# Patient Record
Sex: Male | Born: 1975
Health system: Southern US, Community
[De-identification: ages and names within clinical notes are randomized; demographics above are authoritative.]

## PROBLEM LIST (undated history)

## (undated) DIAGNOSIS — R519 Headache, unspecified: Secondary | ICD-10-CM

## (undated) DIAGNOSIS — F4310 Post-traumatic stress disorder, unspecified: Secondary | ICD-10-CM

## (undated) DIAGNOSIS — R040 Epistaxis: Secondary | ICD-10-CM

## (undated) DIAGNOSIS — M8700 Idiopathic aseptic necrosis of unspecified bone: Secondary | ICD-10-CM

## (undated) DIAGNOSIS — F32A Depression, unspecified: Secondary | ICD-10-CM

## (undated) DIAGNOSIS — I10 Essential (primary) hypertension: Secondary | ICD-10-CM

## (undated) DIAGNOSIS — D649 Anemia, unspecified: Secondary | ICD-10-CM

## (undated) DIAGNOSIS — R569 Unspecified convulsions: Secondary | ICD-10-CM

## (undated) DIAGNOSIS — C719 Malignant neoplasm of brain, unspecified: Secondary | ICD-10-CM

## (undated) HISTORY — DX: Unspecified convulsions: R56.9

## (undated) HISTORY — DX: Post-traumatic stress disorder, unspecified: F43.10

## (undated) HISTORY — DX: Malignant neoplasm of brain, unspecified: C71.9

## (undated) HISTORY — DX: Essential (primary) hypertension: I10

## (undated) HISTORY — PX: WISDOM TOOTH EXTRACTION: SHX21

---
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
## 1999-08-28 ENCOUNTER — Emergency Department (HOSPITAL_COMMUNITY): Admission: EM | Admit: 1999-08-28 | Discharge: 1999-08-28 | Payer: Self-pay | Admitting: Emergency Medicine

## 2011-02-25 DIAGNOSIS — C719 Malignant neoplasm of brain, unspecified: Secondary | ICD-10-CM

## 2011-02-25 DIAGNOSIS — C712 Malignant neoplasm of temporal lobe: Secondary | ICD-10-CM | POA: Insufficient documentation

## 2011-02-25 HISTORY — DX: Malignant neoplasm of brain, unspecified: C71.9

## 2011-02-25 HISTORY — PX: CRANIOTOMY FOR TUMOR: SUR345

## 2013-06-18 ENCOUNTER — Telehealth: Payer: Self-pay | Admitting: Oncology

## 2013-06-18 NOTE — Telephone Encounter (Signed)
S/W PT AND GVE NP APPT 10/24 @ 3 W/DR. [facility] REFERRING NEW [facility] WELCOME PACKET MAILED.

## 2013-06-18 NOTE — Telephone Encounter (Signed)
C/D 06/18/13 for appt 06/21/13

## 2013-06-20 ENCOUNTER — Other Ambulatory Visit: Payer: Self-pay | Admitting: Emergency Medicine

## 2013-06-20 DIAGNOSIS — C719 Malignant neoplasm of brain, unspecified: Secondary | ICD-10-CM

## 2013-06-21 ENCOUNTER — Ambulatory Visit (HOSPITAL_BASED_OUTPATIENT_CLINIC_OR_DEPARTMENT_OTHER): Payer: Managed Care, Other (non HMO) | Admitting: Oncology

## 2013-06-21 ENCOUNTER — Other Ambulatory Visit (HOSPITAL_BASED_OUTPATIENT_CLINIC_OR_DEPARTMENT_OTHER): Payer: Managed Care, Other (non HMO) | Admitting: Lab

## 2013-06-21 ENCOUNTER — Encounter: Payer: Self-pay | Admitting: Oncology

## 2013-06-21 ENCOUNTER — Ambulatory Visit: Payer: Self-pay

## 2013-06-21 DIAGNOSIS — I10 Essential (primary) hypertension: Secondary | ICD-10-CM | POA: Insufficient documentation

## 2013-06-21 DIAGNOSIS — C712 Malignant neoplasm of temporal lobe: Secondary | ICD-10-CM

## 2013-06-21 DIAGNOSIS — C719 Malignant neoplasm of brain, unspecified: Secondary | ICD-10-CM

## 2013-06-21 LAB — CBC WITH DIFFERENTIAL/PLATELET
BASO%: 0.4 % (ref 0.0–2.0)
EOS%: 2 % (ref 0.0–7.0)
HCT: 38.7 % (ref 38.4–49.9)
LYMPH%: 24.4 % (ref 14.0–49.0)
MCH: 34.1 pg — ABNORMAL HIGH (ref 27.2–33.4)
MCHC: 34.4 g/dL (ref 32.0–36.0)
MCV: 99 fL — ABNORMAL HIGH (ref 79.3–98.0)
MONO%: 10.3 % (ref 0.0–14.0)
NEUT#: 2.8 10*3/uL (ref 1.5–6.5)
NEUT%: 62.9 % (ref 39.0–75.0)
Platelets: 154 10*3/uL (ref 140–400)

## 2013-06-21 LAB — COMPREHENSIVE METABOLIC PANEL (CC13)
ALT: 13 U/L (ref 0–55)
AST: 15 U/L (ref 5–34)
Creatinine: 0.9 mg/dL (ref 0.7–1.3)
Glucose: 83 mg/dl (ref 70–140)
Total Bilirubin: 0.33 mg/dL (ref 0.20–1.20)

## 2013-06-21 MED ORDER — AMLODIPINE BESY-BENAZEPRIL HCL 5-20 MG PO CAPS: 1.0000 | ORAL_CAPSULE | Freq: Two times a day (BID) | ORAL | Status: DC

## 2013-06-21 NOTE — Patient Instructions (Signed)
Proceed with avastin on 10/27  I will see you back in 3 weeks on a Wednesday/Thursday

## 2013-06-23 NOTE — Progress Notes (Signed)
[facility]  Telephone:(336) [phone] Fax:(336) [phone]   MEDICAL ONCOLOGY - INITIAL CONSULATION    Referral MD Dr. Hulan Fray at [facility] neuro oncology  Reason for Referral: 37 year old gentleman with anaplastic oligodendroglioma diagnosed in 2012  Chief Complaint  Patient presents with  . 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 Patient's oncologic history dates as follows: 01/18/11: The patient complained of chest pain and burning sensation in the throat with associated with a metallic taste, hand tingling, and confusion described as "walking around in circles". 01/20/11: Patient presented to the emergency room where a CT/MRI scan revealed a right temporal lobe lesion, measuring 4.2 cm with mild enhancement and a mild 8 mm shift and uncal herniation. 01/28/11: The patient presented to the neurosurgery clinic with continual seizures TID. He also had associated right sided headaches. His Dilantin was changed to Keppra therapy. 02/25/11: Right temporal craniotomy with Dr. Lavonna Monarch with 70% removal of the tumor. Pathology revealed anaplastic oligodendroglioma (1p19q results pending). Postoperative MRI revealed interval large right temporal lobe craniotomy with residual (20%) tumor and a subdural fluid collection with a 7 mm right to left midline shift. There is linear enhancement around the resection, consistent with blood. 03/03/11: New patient evaluation at [facility]. Recommend metronomic Temodar at 50 mg/m2 daily, to begin 3 weeks after craniotomy and to be coordinated by his new local oncologist, Dr. Shella Maxim.  05/05/11: 1p19q is deleted. Status post two cycles of metronomic Temozolomide at 50 mg/m2. Stable disease. 09/07/11: MRI is  progressive. Patient has completed six cycles of metronomic temozolomide. Recommend radiation therapy with concomitant Temodar. 12/07/11: Completed standard radiation with concomitant temozolomide and Avastin. Radiographically stable/improved. Proceed with metronomic Temodar 50 mg/m2 daily and Avastin 10 mg/kg IV every two weeks  April 2014 patient was begun on Avastin 10 mg per kilo every 3 weeks He has 2 more cycles remaining.   Patient is now relocating to [facility] to be closer to family. 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 He would like to establish his care at [facility].     Past Medical History  Diagnosis Date  . Brain cancer 02/25/2011    grade III anaplastic ologodendrglioma  :  Past Surgical History  Procedure Laterality Date  . Craniotomy for tumor Right 02/25/2011  :  Current Outpatient Prescriptions  Medication Sig Dispense Refill  . amLODipine-benazepril (LOTREL) 5-20 MG per capsule Take 1 capsule by mouth 2 (two) times daily.  60 capsule  6  . mirtazapine (REMERON) 30 MG tablet Take 30 mg by mouth at bedtime.      . Oxcarbazepine (TRILEPTAL) 300 MG tablet Take 300 mg by mouth 2 (two) times daily.       No current facility-administered medications for this visit.     Allergies  Allergen Reactions  . Contrast Media [Iodinated Diagnostic Agents] Rash  . Penicillins Shortness Of Breath and Rash  . Shellfish Allergy Rash  :  Family History  Problem Relation Age of Onset  . Cancer Father     skin cancer  . Cancer Maternal Grandmother 70    brain cancer  . Cancer Other     brain cancer  :  History   Social History  . Marital Status: Single    Spouse Name: N/A    Number of Children: N/A  .  Years of Education: N/A   Occupational History  . Not on file.   Social History Main Topics  . Smoking status: Former Smoker    Quit date: 02/25/2011  . Smokeless tobacco: Never Used  . Alcohol Use: 8.4 oz/week    14 Cans of beer  per week  . Drug Use: 2.00 per week    Special: Marijuana  . Sexual Activity: Yes     Comment: E-cigarette users   Other Topics Concern  . Not on file   Social History Narrative  . No narrative on file  :  A comprehensive review of systems was negative.  Exam: @IPVITALS @  General:  well-nourished in no acute distress.  Eyes:  no scleral icterus.  ENT:  There were no oropharyngeal lesions.  Neck was without thyromegaly.  Lymphatics:  Negative cervical, supraclavicular or axillary adenopathy.  Respiratory: lungs were clear bilaterally without wheezing or crackles.  Cardiovascular:  Regular rate and rhythm, S1/S2, without murmur, rub or gallop.  There was no pedal edema.  GI:  abdomen was soft, flat, nontender, nondistended, without organomegaly.  Muscoloskeletal:  no spinal tenderness of palpation of vertebral spine.  Skin exam was without echymosis, petichae.  Neuro exam was nonfocal.  Patient was able to get on and off exam table without assistance.  Gait was normal.  Patient was alerted and oriented.  Attention was good.   Language was appropriate.  Mood was normal without depression.  Speech was not pressured.  Thought content was not tangential.     Lab Results  Component Value Date   WBC 4.5 06/21/2013   HGB 13.3 06/21/2013   HCT 38.7 06/21/2013   PLT 154 06/21/2013   GLUCOSE 83 06/21/2013   ALT 13 06/21/2013   AST 15 06/21/2013   NA 141 06/21/2013   K 4.2 06/21/2013   CREATININE 0.9 06/21/2013   BUN 18.1 06/21/2013   CO2 25 06/21/2013    No results found.    No results found.  Assessment and Plan: 37 year old gentleman with anaplastic oligodendroglioma originally diagnosed on 02/25/2012. Patient has received fresh craniotomy with 70% removal of the tumor. He subsequently received adjuvant chemotherapy therapy initially consisting of Temodar. Thereafter he had progressive disease in January 2019 at that time he completed 6 cycles of temozolomide followed by radiation  therapy with concomitant Temodar. He then went on to receive Temodar and Avastin. He is now on maintenance Avastin at 5 mg per kilogram every 3 weeks. Overall he is tolerating it well. His only complication Temodar has been development of hypertension for which he is on antihypertensives. On his last year and protein at [facility] he was also noted to have proteinuria. Today his protein is 30. Otherwise patient is doing well.  We will proceed with Avastin on Monday as maintenance. Patient is to be gone to [facility] for followup and an MRI. We will see what further recommendations are will continue to follow.  I spent an extensive time with the patient and his wife counseling them regarding his diagnosis and treatment that he is received and the possibility of future treatment. Patient and his wife are also interested in starting a family. We discussed the implications of being on Avastin. Hopefully once he finishes Avastin he may be able to proceed with her to conceive.  The length of time of the face-to-face encounter was 60    minutes. More than 50% of time was spent counseling and coordination of care.  Drue Second, MD Medical/Oncology [facility] [phone] (beeper) [phone] (Office)

## 2013-06-24 ENCOUNTER — Telehealth: Payer: Self-pay | Admitting: Oncology

## 2013-06-24 ENCOUNTER — Other Ambulatory Visit: Payer: Self-pay | Admitting: Oncology

## 2013-06-24 ENCOUNTER — Ambulatory Visit (HOSPITAL_BASED_OUTPATIENT_CLINIC_OR_DEPARTMENT_OTHER): Payer: Managed Care, Other (non HMO)

## 2013-06-24 ENCOUNTER — Telehealth: Payer: Self-pay | Admitting: *Deleted

## 2013-06-24 VITALS — BP 122/84 | HR 61 | Temp 97.7°F | Resp 18 | Ht 73.0 in | Wt 191.0 lb

## 2013-06-24 DIAGNOSIS — Z5112 Encounter for antineoplastic immunotherapy: Secondary | ICD-10-CM

## 2013-06-24 DIAGNOSIS — C712 Malignant neoplasm of temporal lobe: Secondary | ICD-10-CM

## 2013-06-24 MED ORDER — SODIUM CHLORIDE 0.9 % IV SOLN
5.0000 mg/kg | Freq: Once | INTRAVENOUS | Status: AC
  Administered 2013-06-24: 425 mg via INTRAVENOUS
  Filled 2013-06-24: qty 17

## 2013-06-24 MED ORDER — SODIUM CHLORIDE 0.9 % IV SOLN
Freq: Once | INTRAVENOUS | Status: AC
  Administered 2013-06-24: 16:00:00 via INTRAVENOUS

## 2013-06-24 NOTE — Telephone Encounter (Signed)
Per staff message and POF I have scheduled appts.  JMW  

## 2013-06-24 NOTE — Patient Instructions (Signed)
[facility] Discharge Instructions for Patients Receiving Chemotherapy  Today you received the following chemotherapy agents Avastin.  To help prevent nausea and vomiting after your treatment, we encourage you to take your nausea medication.   If you develop nausea and vomiting that is not controlled by your nausea medication, call the clinic.   BELOW ARE SYMPTOMS THAT SHOULD BE REPORTED IMMEDIATELY:  *FEVER GREATER THAN 100.5 F  *CHILLS WITH OR WITHOUT FEVER  NAUSEA AND VOMITING THAT IS NOT CONTROLLED WITH YOUR NAUSEA MEDICATION  *UNUSUAL SHORTNESS OF BREATH  *UNUSUAL BRUISING OR BLEEDING  TENDERNESS IN MOUTH AND THROAT WITH OR WITHOUT PRESENCE OF ULCERS  *URINARY PROBLEMS  *BOWEL PROBLEMS  UNUSUAL RASH Items with * indicate a potential emergency and should be followed up as soon as possible.  Feel free to call the clinic you have any questions or concerns. The clinic phone number is [phone].    

## 2013-06-25 ENCOUNTER — Other Ambulatory Visit: Payer: Managed Care, Other (non HMO)

## 2013-06-25 ENCOUNTER — Ambulatory Visit: Payer: Managed Care, Other (non HMO)

## 2013-06-25 ENCOUNTER — Other Ambulatory Visit: Payer: Managed Care, Other (non HMO) | Admitting: Lab

## 2013-07-17 ENCOUNTER — Telehealth: Payer: Self-pay | Admitting: *Deleted

## 2013-07-17 ENCOUNTER — Ambulatory Visit: Payer: Managed Care, Other (non HMO) | Admitting: Adult Health

## 2013-07-17 ENCOUNTER — Other Ambulatory Visit: Payer: Managed Care, Other (non HMO) | Admitting: Lab

## 2013-07-17 ENCOUNTER — Telehealth: Payer: Self-pay | Admitting: Oncology

## 2013-07-17 ENCOUNTER — Ambulatory Visit: Payer: Managed Care, Other (non HMO)

## 2013-07-17 NOTE — Telephone Encounter (Signed)
Pt called lmovm he had his last visit at [facility] on 10/29, MRI was done and he was advised he would not need any more avastin. Pt cancelled todays appt 11/19. POF sent to cancel appt. Provider notified.

## 2013-07-17 NOTE — Telephone Encounter (Signed)
, °

## 2013-10-22 ENCOUNTER — Other Ambulatory Visit: Payer: Self-pay | Admitting: Oncology

## 2014-03-12 ENCOUNTER — Telehealth: Payer: Self-pay

## 2014-03-12 NOTE — Telephone Encounter (Signed)
Received by fax dtd 03/11/14 from [place] copy of internal evaluation notes - Dr. Simmie Davies.  Original to scan.  Copy to [facility]

## 2014-03-27 ENCOUNTER — Ambulatory Visit (INDEPENDENT_AMBULATORY_CARE_PROVIDER_SITE_OTHER): Payer: Self-pay | Admitting: Radiology

## 2014-03-27 ENCOUNTER — Ambulatory Visit (INDEPENDENT_AMBULATORY_CARE_PROVIDER_SITE_OTHER): Payer: Managed Care, Other (non HMO) | Admitting: Neurology

## 2014-03-27 DIAGNOSIS — Z0289 Encounter for other administrative examinations: Secondary | ICD-10-CM

## 2014-03-27 DIAGNOSIS — R209 Unspecified disturbances of skin sensation: Secondary | ICD-10-CM

## 2014-03-27 NOTE — Procedures (Signed)
     HISTORY:  Shawn York is a 38 year old gentleman with a history of a right temporal anaplastic oligodendroglioma diagnosed in 2012. He underwent chemotherapy with Temodar and Avastin. Since September 2014, he has noted some numbness sensations in the ulnar aspect of the hands bilaterally, more notable at nighttime when he is sleeping. He is being evaluated for a possible neuropathy or a cervical radiculopathy. He denies any significant neck discomfort.  NERVE CONDUCTION STUDIES:  Nerve conduction studies were performed on both upper extremities. The distal motor latencies and motor amplitudes for the median and ulnar nerves were within normal limits. The F wave latencies and nerve conduction velocities for these nerves were also normal. The sensory latencies for the median and ulnar nerves were normal.   EMG STUDIES:  EMG study was performed on the right upper extremity:  The first dorsal interosseous muscle reveals 2 to 4 K units with full recruitment. No fibrillations or positive waves were noted. The abductor pollicis brevis muscle reveals 2 to 4 K units with full recruitment. No fibrillations or positive waves were noted. The extensor indicis proprius muscle reveals 1 to 3 K units with full recruitment. No fibrillations or positive waves were noted. The pronator teres muscle reveals 2 to 3 K units with full recruitment. No fibrillations or positive waves were noted. The biceps muscle reveals 1 to 2 K units with full recruitment. No fibrillations or positive waves were noted. The triceps muscle reveals 2 to 4 K units with full recruitment. No fibrillations or positive waves were noted. The anterior deltoid muscle reveals 2 to 3 K units with full recruitment. No fibrillations or positive waves were noted. The cervical paraspinal muscles were tested at 2 levels. No abnormalities of insertional activity were seen at either level tested. There was good relaxation.  EMG study was  performed on the left upper extremity:  The first dorsal interosseous muscle reveals 2 to 4 K units with full recruitment. No fibrillations or positive waves were noted. The abductor pollicis brevis muscle reveals 2 to 4 K units with full recruitment. No fibrillations or positive waves were noted. The extensor indicis proprius muscle reveals 1 to 3 K units with full recruitment. No fibrillations or positive waves were noted. The pronator teres muscle reveals 2 to 3 K units with full recruitment. No fibrillations or positive waves were noted. The biceps muscle reveals 1 to 2 K units with full recruitment. No fibrillations or positive waves were noted. The triceps muscle reveals 2 to 4 K units with full recruitment. No fibrillations or positive waves were noted. The anterior deltoid muscle reveals 2 to 3 K units with full recruitment. No fibrillations or positive waves were noted. The cervical paraspinal muscles were tested at 2 levels. No abnormalities of insertional activity were seen at either level tested. There was good relaxation.   IMPRESSION:  Nerve conduction studies done on both upper extremities were within normal limits. No evidence of a neuropathy is seen. EMG evaluation of both upper extremities is normal, without evidence of an overlying cervical radiculopathy on either side.  Shawn Alexanders MD 03/27/2014 11:10 AM  [facility] [address] [place], [place] [address]  Phone [phone] Fax [phone]

## 2014-05-13 ENCOUNTER — Telehealth: Payer: Self-pay

## 2014-05-13 NOTE — Telephone Encounter (Signed)
Office note received by fax dtd 05/08/14 Dr Simmie Davies.  Sent to scan.

## 2014-06-26 ENCOUNTER — Other Ambulatory Visit: Payer: Self-pay | Admitting: *Deleted

## 2014-06-26 ENCOUNTER — Other Ambulatory Visit: Payer: Self-pay | Admitting: Oncology

## 2016-08-17 ENCOUNTER — Inpatient Hospital Stay (HOSPITAL_COMMUNITY)
Admission: AD | Admit: 2016-08-17 | Discharge: 2016-08-21 | DRG: 885 | Disposition: A | Discharge status: Home / Self Care | Payer: Managed Care, Other (non HMO) | Attending: Psychiatry | Admitting: Psychiatry

## 2016-08-17 ENCOUNTER — Encounter (HOSPITAL_COMMUNITY): Payer: Self-pay

## 2016-08-17 DIAGNOSIS — Z23 Encounter for immunization: Secondary | ICD-10-CM | POA: Diagnosis not present

## 2016-08-17 DIAGNOSIS — F4310 Post-traumatic stress disorder, unspecified: Secondary | ICD-10-CM | POA: Diagnosis present

## 2016-08-17 DIAGNOSIS — F332 Major depressive disorder, recurrent severe without psychotic features: Secondary | ICD-10-CM | POA: Diagnosis not present

## 2016-08-17 DIAGNOSIS — F17290 Nicotine dependence, other tobacco product, uncomplicated: Secondary | ICD-10-CM | POA: Diagnosis present

## 2016-08-17 DIAGNOSIS — Z808 Family history of malignant neoplasm of other organs or systems: Secondary | ICD-10-CM | POA: Diagnosis not present

## 2016-08-17 DIAGNOSIS — F339 Major depressive disorder, recurrent, unspecified: Principal | ICD-10-CM | POA: Diagnosis present

## 2016-08-17 DIAGNOSIS — F411 Generalized anxiety disorder: Secondary | ICD-10-CM | POA: Diagnosis present

## 2016-08-17 DIAGNOSIS — Z79899 Other long term (current) drug therapy: Secondary | ICD-10-CM | POA: Diagnosis not present

## 2016-08-17 DIAGNOSIS — Z880 Allergy status to penicillin: Secondary | ICD-10-CM | POA: Diagnosis not present

## 2016-08-17 DIAGNOSIS — Z888 Allergy status to other drugs, medicaments and biological substances status: Secondary | ICD-10-CM | POA: Diagnosis not present

## 2016-08-17 DIAGNOSIS — Z85841 Personal history of malignant neoplasm of brain: Secondary | ICD-10-CM

## 2016-08-17 DIAGNOSIS — R45851 Suicidal ideations: Secondary | ICD-10-CM | POA: Diagnosis present

## 2016-08-17 DIAGNOSIS — Z87891 Personal history of nicotine dependence: Secondary | ICD-10-CM | POA: Diagnosis not present

## 2016-08-17 DIAGNOSIS — G4700 Insomnia, unspecified: Secondary | ICD-10-CM | POA: Diagnosis present

## 2016-08-17 DIAGNOSIS — I10 Essential (primary) hypertension: Secondary | ICD-10-CM | POA: Diagnosis present

## 2016-08-17 MED ORDER — HYDROXYZINE HCL 25 MG PO TABS
25.0000 mg | ORAL_TABLET | Freq: Four times a day (QID) | ORAL | Status: DC | PRN
  Administered 2016-08-18 – 2016-08-20 (×3): 25 mg via ORAL
  Filled 2016-08-17 (×3): qty 1

## 2016-08-17 MED ORDER — AMLODIPINE BESY-BENAZEPRIL HCL 5-20 MG PO CAPS: 1.0000 | ORAL_CAPSULE | Freq: Every day | ORAL | Status: DC

## 2016-08-17 MED ORDER — TRAZODONE HCL 50 MG PO TABS
50.0000 mg | ORAL_TABLET | Freq: Every evening | ORAL | Status: DC | PRN
  Administered 2016-08-17 – 2016-08-20 (×4): 50 mg via ORAL
  Filled 2016-08-17 (×4): qty 1

## 2016-08-17 MED ORDER — AMLODIPINE BESYLATE 5 MG PO TABS
5.0000 mg | ORAL_TABLET | Freq: Every day | ORAL | Status: DC
  Administered 2016-08-17 – 2016-08-20 (×4): 5 mg via ORAL
  Filled 2016-08-17 (×6): qty 1

## 2016-08-17 MED ORDER — AMLODIPINE BESYLATE 5 MG PO TABS
5.0000 mg | ORAL_TABLET | Freq: Every day | ORAL | Status: DC
  Administered 2016-08-17: 5 mg via ORAL
  Filled 2016-08-17 (×3): qty 1

## 2016-08-17 MED ORDER — MAGNESIUM HYDROXIDE 400 MG/5ML PO SUSP: 30.0000 mL | Freq: Every day | ORAL | Status: DC | PRN

## 2016-08-17 MED ORDER — SERTRALINE HCL 50 MG PO TABS
50.0000 mg | ORAL_TABLET | Freq: Every day | ORAL | Status: DC
  Administered 2016-08-18: 50 mg via ORAL
  Filled 2016-08-17 (×3): qty 1

## 2016-08-17 MED ORDER — BENAZEPRIL HCL 20 MG PO TABS
20.0000 mg | ORAL_TABLET | Freq: Every day | ORAL | Status: DC
  Administered 2016-08-17: 20 mg via ORAL
  Filled 2016-08-17 (×2): qty 1
  Filled 2016-08-17: qty 2

## 2016-08-17 MED ORDER — ALUM & MAG HYDROXIDE-SIMETH 200-200-20 MG/5ML PO SUSP: 30.0000 mL | ORAL | Status: DC | PRN

## 2016-08-17 MED ORDER — BENAZEPRIL HCL 20 MG PO TABS
20.0000 mg | ORAL_TABLET | Freq: Every day | ORAL | Status: DC
  Administered 2016-08-17 – 2016-08-20 (×4): 20 mg via ORAL
  Filled 2016-08-17 (×6): qty 1

## 2016-08-17 MED ORDER — ACETAMINOPHEN 325 MG PO TABS
650.0000 mg | ORAL_TABLET | Freq: Four times a day (QID) | ORAL | Status: DC | PRN
Start: 2016-08-17 — End: 2016-08-21

## 2016-08-17 MED ORDER — INFLUENZA VAC SPLIT QUAD 0.5 ML IM SUSY
0.5000 mL | PREFILLED_SYRINGE | INTRAMUSCULAR | Status: DC
  Filled 2016-08-17: qty 0.5

## 2016-08-17 MED ORDER — OXCARBAZEPINE 300 MG PO TABS
300.0000 mg | ORAL_TABLET | Freq: Two times a day (BID) | ORAL | Status: DC
  Administered 2016-08-17 – 2016-08-21 (×8): 300 mg via ORAL
  Filled 2016-08-17 (×13): qty 1
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
## 2016-08-17 NOTE — Consult Note (Signed)
Behavioral Health Medical Screening Exam  Shawn York is an 40 y.o. male.  Total Time spent with patient: 15 minutes  Psychiatric Specialty Exam: Physical Exam  Review of Systems  Psychiatric/Behavioral: Positive for depression and suicidal ideas. Negative for hallucinations and substance abuse. The patient is nervous/anxious and has insomnia.   All other systems reviewed and are negative.   Blood pressure (!) 152/107, pulse 81, temperature 98.4 F (36.9 C), temperature source Oral, resp. rate 16, SpO2 100 %.There is no height or weight on file to calculate BMI.  General Appearance: Casual and Fairly Groomed  Eye Contact:  Good  Speech:  Clear and Coherent and Normal Rate  Volume:  Normal  Mood:  Anxious and Depressed  Affect:  Appropriate and Congruent  Thought Process:  Coherent, Goal Directed, Linear and Descriptions of Associations: Intact  Orientation:  Full (Time, Place, and Person)  Thought Content:  Symptoms, worries, concerns  Suicidal Thoughts:  Yes.  with intent/plan  Homicidal Thoughts:  No  Memory:  Immediate;   Fair Recent;   Fair Remote;   Fair  Judgement:  Fair  Insight:  Good  Psychomotor Activity:  Normal  Concentration: Concentration: Fair and Attention Span: Fair  Recall:  [employer] [place]: Fair  Akathisia:  No  Handed:    AIMS (if indicated):     Assets:  Communication Skills Desire for Improvement Resilience Social Support  Sleep:       Musculoskeletal: Strength & Muscle Tone: within normal limits Gait & Station: normal Patient leans: N/A  Blood pressure (!) 152/107, pulse 81, temperature 98.4 F (36.9 C), temperature source Oral, resp. rate 16, SpO2 100 %.  Recommendations:  Based on my evaluation the patient does not appear to have an emergency medical condition.  Pt will be going inpatient [address]. Pt has hx of seizures, hypertension, and brain tumor (clear since 2012).   Benjamine Mola, FNP 08/17/2016, 12:21  PM Agree with NP Assessment  as above  Neita Garnet, MD

## 2016-08-17 NOTE — [facility] Group Notes (Signed)
[place] LCSW Group Therapy 08/17/2016 1:15 PM  Type of Therapy: Group Therapy- Emotion Regulation  Participation Level: Minimal  Participation Quality:  Appropriate  Affect: Flat  Cognitive: Alert and Oriented   Insight:  Developing/Improving  Engagement in Therapy: Developing/Improving and Engaged   Modes of Intervention: Clarification, Confrontation, Discussion, Education, Exploration, Limit-setting, Orientation, Problem-solving, Rapport Building, Art therapist, Socialization and Support  Summary of Progress/Problems: The topic for group today was emotional regulation. This group focused on both positive and negative emotion identification and allowed group members to process ways to identify feelings, regulate negative emotions, and find healthy ways to manage internal/external emotions. Group members were asked to reflect on a time when their reaction to an emotion led to a negative outcome and explored how alternative responses using emotion regulation would have benefited them. Group members were also asked to discuss a time when emotion regulation was utilized when a negative emotion was experienced. Pt identified anger as an emotion he has difficulty regulating. He reports that he often can feel physical symptoms of his anger and has to find a release.    Adriana Reams, LCSW 08/17/2016 4:21 PM

## 2016-08-17 NOTE — Tx Team (Signed)
Initial Treatment Plan 08/17/2016 2:10 PM Shawn York [id number]    PATIENT STRESSORS: Educational concerns   PATIENT STRENGTHS: Ability for insight Active sense of humor Average or above average intelligence Capable of independent living Occupational psychologist fund of knowledge Motivation for treatment/growth Physical Health   PATIENT IDENTIFIED PROBLEMS: "Suicidal Thoughts"  "Depression and anxiety"                   DISCHARGE CRITERIA:  Ability to meet basic life and health needs Adequate post-discharge living arrangements Improved stabilization in mood, thinking, and/or behavior Motivation to continue treatment in a less acute level of care Need for constant or close observation no longer present  PRELIMINARY DISCHARGE PLAN: Outpatient therapy  PATIENT/FAMILY INVOLVEMENT: This treatment plan has been presented to and reviewed with the patient, Shawn York.  The patient and family have been given the opportunity to ask questions and make suggestions.  Milbert Coulter, RN 08/17/2016, 2:10 PM

## 2016-08-18 DIAGNOSIS — F332 Major depressive disorder, recurrent severe without psychotic features: Secondary | ICD-10-CM

## 2016-08-18 DIAGNOSIS — Z888 Allergy status to other drugs, medicaments and biological substances status: Secondary | ICD-10-CM

## 2016-08-18 DIAGNOSIS — Z808 Family history of malignant neoplasm of other organs or systems: Secondary | ICD-10-CM

## 2016-08-18 DIAGNOSIS — Z880 Allergy status to penicillin: Secondary | ICD-10-CM

## 2016-08-18 DIAGNOSIS — Z79899 Other long term (current) drug therapy: Secondary | ICD-10-CM

## 2016-08-18 DIAGNOSIS — Z91013 Allergy to seafood: Secondary | ICD-10-CM

## 2016-08-18 LAB — CBC WITH DIFFERENTIAL/PLATELET
Basophils Absolute: 0 10*3/uL (ref 0.0–0.1)
Basophils Relative: 0 %
EOS ABS: 0.1 10*3/uL (ref 0.0–0.7)
EOS PCT: 1 %
HCT: 39.5 % (ref 39.0–52.0)
Hemoglobin: 13.9 g/dL (ref 13.0–17.0)
LYMPHS ABS: 1.3 10*3/uL (ref 0.7–4.0)
LYMPHS PCT: 20 %
MCH: 33.1 pg (ref 26.0–34.0)
MCHC: 35.2 g/dL (ref 30.0–36.0)
MCV: 94 fL (ref 78.0–100.0)
MONO ABS: 0.5 10*3/uL (ref 0.1–1.0)
Monocytes Relative: 8 %
Neutro Abs: 4.6 10*3/uL (ref 1.7–7.7)
Neutrophils Relative %: 71 %
PLATELETS: 156 10*3/uL (ref 150–400)
RBC: 4.2 MIL/uL — AB (ref 4.22–5.81)
RDW: 13.1 % (ref 11.5–15.5)
WBC: 6.5 10*3/uL (ref 4.0–10.5)

## 2016-08-18 MED ORDER — SERTRALINE HCL 100 MG PO TABS
100.0000 mg | ORAL_TABLET | Freq: Every day | ORAL | Status: DC
  Administered 2016-08-19: 100 mg via ORAL
  Filled 2016-08-18 (×3): qty 1

## 2016-08-18 NOTE — Progress Notes (Signed)
D- Patient appears anxious and depressed.  Patient endorses SI without a plan. Patient verbally contracts for safety.  Denies HI, AVH, and pain.  Patient reports trouble sleeping.  Patient states "I wake up 100 times per night. Every time the door opens for a check or if I hear movement, I wake up".  Patient reports that an additional trigger to his suicidal thoughts is "people peeping on me".  Patient was educated on the reason behind the 15 minute checks and suggestions were offered to aid in patient's ability to sleep such as earplugs.  Patient has complaints of anxiety and states "I think I'm anxious because I'm so use to being busy and now I don't have much to do". Patient was encouraged to do some activities that he has not had time to do previously such as sketching.  On patient's self inventory, patient rates his depression "6", feelings of hopeless and anxiety "5" with 10 being the worst.  Patient's goal for today is "patience and to relax". Patient plans to "sit around" to help achieve his goal for today. Patient observed in the Dayroom playing a game with peers. A- Scheduled medications administered to patient, per MD orders. Support and encouragement provided.  Routine safety checks conducted every 15 minutes.  Patient informed to notify staff with problems or concerns. R- No adverse drug reactions noted. Patient compliant with medications and treatment plan. Patient receptive, calm, and cooperative. Patient interacts well with others on the unit.  Patient remains safe at this time.

## 2016-08-18 NOTE — Tx Team (Signed)
Interdisciplinary Treatment and Diagnostic Plan Update  08/18/2016 Time of Session: 3:54 PM  Shawn York MRN: [id number]  Principal Diagnosis: <principal problem not specified>  Secondary Diagnoses: Active Problems:   Major depressive disorder, recurrent episode (HCC)   Current Medications:  Current Facility-Administered Medications  Medication Dose Route Frequency Provider Last Rate Last Dose  . acetaminophen (TYLENOL) tablet 650 mg  650 mg Oral Q6H PRN Sanjuana Kava, NP      . alum & mag hydroxide-simeth (MAALOX/MYLANTA) 200-200-20 MG/5ML suspension 30 mL  30 mL Oral Q4H PRN Sanjuana Kava, NP      . amLODipine (NORVASC) tablet 5 mg  5 mg Oral QHS Sanjuana Kava, NP   5 mg at 08/17/16 2255  . benazepril (LOTENSIN) tablet 20 mg  20 mg Oral QHS Sanjuana Kava, NP   20 mg at 08/17/16 2255  . hydrOXYzine (ATARAX/VISTARIL) tablet 25 mg  25 mg Oral Q6H PRN Sanjuana Kava, NP      . Influenza vac split quadrivalent PF (FLUARIX) injection 0.5 mL  0.5 mL Intramuscular Tomorrow-1000 Fernando A Cobos, MD      . magnesium hydroxide (MILK OF MAGNESIA) suspension 30 mL  30 mL Oral Daily PRN Sanjuana Kava, NP      . Oxcarbazepine (TRILEPTAL) tablet 300 mg  300 mg Oral BID Kerry Hough, PA-C   300 mg at 08/18/16 0801  . [START ON 08/19/2016] sertraline (ZOLOFT) tablet 100 mg  100 mg Oral Daily Rockey Situ Cobos, MD      . traZODone (DESYREL) tablet 50 mg  50 mg Oral QHS PRN Sanjuana Kava, NP   50 mg at 08/17/16 2310    PTA Medications: Prescriptions Prior to Admission  Medication Sig Dispense Refill Last Dose  . amLODipine-benazepril (LOTREL) 5-20 MG per capsule Take 1 capsule by mouth 2 (two) times daily. (Patient taking differently: Take 1 capsule by mouth daily. ) 60 capsule 6 08/15/2016 at 2100  . EPINEPHrine 0.3 mg/0.3 mL IJ SOAJ injection Inject 0.3 mg into the muscle once. As needed for severe allergic reaction   unknown  . ibuprofen (ADVIL,MOTRIN) 200 MG tablet Take 400 mg by mouth every 6  (six) hours as needed for moderate pain.   08/17/2016 at Unknown time  . Oxcarbazepine (TRILEPTAL) 300 MG tablet Take 300 mg by mouth 2 (two) times daily.   08/17/2016 at 0800  . sertraline (ZOLOFT) 50 MG tablet Take 50 mg by mouth daily.   08/17/2016 at 0800    Treatment Modalities: Medication Management, Group therapy, Case management,  1 to 1 session with clinician, Psychoeducation, Recreational therapy.  Patient Stressors: Educational concerns  Patient Strengths: Ability for insight Active sense of humor Average or above average intelligence Capable of independent living Metallurgist fund of knowledge Motivation for treatment/growth Physical Health  Physician Treatment Plan for Primary Diagnosis: <principal problem not specified> Long Term Goal(s): Improvement in symptoms so as ready for discharge  Short Term Goals: Ability to verbalize feelings will improve Ability to disclose and discuss suicidal ideas Ability to demonstrate self-control will improve Ability to identify and develop effective coping behaviors will improve Ability to maintain clinical measurements within normal limits will improve Ability to verbalize feelings will improve Ability to disclose and discuss suicidal ideas Ability to demonstrate self-control will improve Ability to identify and develop effective coping behaviors will improve Ability to maintain clinical measurements within normal limits will improve  Medication Management: Evaluate patient's response, side effects, and  tolerance of medication regimen.  Therapeutic Interventions: 1 to 1 sessions, Unit Group sessions and Medication administration.  Evaluation of Outcomes: Not Met  Physician Treatment Plan for Secondary Diagnosis: Active Problems:   Major depressive disorder, recurrent episode ([place])   Long Term Goal(s): Improvement in symptoms so as ready for discharge  Short Term Goals: Ability to verbalize  feelings will improve Ability to disclose and discuss suicidal ideas Ability to demonstrate self-control will improve Ability to identify and develop effective coping behaviors will improve Ability to maintain clinical measurements within normal limits will improve Ability to verbalize feelings will improve Ability to disclose and discuss suicidal ideas Ability to demonstrate self-control will improve Ability to identify and develop effective coping behaviors will improve Ability to maintain clinical measurements within normal limits will improve  Medication Management: Evaluate patient's response, side effects, and tolerance of medication regimen.  Therapeutic Interventions: 1 to 1 sessions, Unit Group sessions and Medication administration.  Evaluation of Outcomes: Not Met   RN Treatment Plan for Primary Diagnosis: <principal problem not specified> Long Term Goal(s): Knowledge of disease and therapeutic regimen to maintain health will improve  Short Term Goals: Ability to verbalize feelings will improve, Ability to disclose and discuss suicidal ideas and Ability to identify and develop effective coping behaviors will improve  Medication Management: RN will administer medications as ordered by provider, will assess and evaluate patient's response and provide education to patient for prescribed medication. RN will report any adverse and/or side effects to prescribing provider.  Therapeutic Interventions: 1 on 1 counseling sessions, Psychoeducation, Medication administration, Evaluate responses to treatment, Monitor vital signs and CBGs as ordered, Perform/monitor CIWA, COWS, AIMS and Fall Risk screenings as ordered, Perform wound care treatments as ordered.  Evaluation of Outcomes: Not Met   LCSW Treatment Plan for Primary Diagnosis: <principal problem not specified> Long Term Goal(s): Safe transition to appropriate next level of care at discharge, Engage patient in therapeutic group  addressing interpersonal concerns.  Short Term Goals: Engage patient in aftercare planning with referrals and resources, Identify triggers associated with mental health/substance abuse issues and Increase skills for wellness and recovery  Therapeutic Interventions: Assess for all discharge needs, 1 to 1 time with Social worker, Explore available resources and support systems, Assess for adequacy in community support network, Educate family and significant other(s) on suicide prevention, Complete Psychosocial Assessment, Interpersonal group therapy.  Evaluation of Outcomes: Not Met   Progress in Treatment: Attending groups: Pt is new to milieu, continuing to assess  Participating in groups: Pt is new to milieu, continuing to assess  Taking medication as prescribed: Yes, MD continues to assess for medication changes as needed Toleration medication: Yes, no side effects reported at this time Family/Significant other contact made: No, CSW attempting to make contact with wife Patient understands diagnosis: Continuing to assess Discussing patient identified problems/goals with staff: Yes Medical problems stabilized or resolved: Yes Denies suicidal/homicidal ideation: No, endorses passive SI Issues/concerns per patient self-inventory: None Other: N/A  New problem(s) identified: None identified at this time.   New Short Term/Long Term Goal(s): None identified at this time.   Discharge Plan or Barriers: Pt will return home and follow-up with outpatient services.   Reason for Continuation of Hospitalization: Anxiety Depression Medication stabilization Suicidal ideation  Estimated Length of Stay: 3-5 days  Attendees: Patient: 08/18/2016  3:54 PM  Physician: Dr. Parke Poisson 08/18/2016  3:54 PM  Nursing: Eulogio Bear, RN; Hester Mates, RN 08/18/2016  3:54 PM  RN Care Manager: Lars Pinks, RN 08/18/2016  3:54 PM  Social Worker: Adriana Reams, Renelda Mom Drinkard, LCSW 08/18/2016  3:54 PM   Recreational Therapist:  08/18/2016  3:54 PM  Other: Lindell Spar, NP 08/18/2016  3:54 PM  Other:  08/18/2016  3:54 PM  Other: 08/18/2016  3:54 PM    Scribe for Treatment Team: Gladstone Lighter, LCSW 08/18/2016 3:54 PM

## 2016-08-18 NOTE — Progress Notes (Signed)
Nursing Progress Note 7p-7a  D) Patient presents with flat affect and is seen pacing the hall at times. Patient appears anxious and states "i've never had this much free time before". Patient reports he is in [place], has a full time job and has a one year old child. Patient is calm and cooperative. Patient denies SI/HI/AVH or pain. Patient contracts for safety at this time. Patient reports that he takes trileptal 300 mg bid and Zoloft 50 mg daily. No orders for these medications.   A) Emotional support given. PRN trazodone given for sleep. PA Spencer notified and orders obtained for patient's PTA meds. Patient on q15 min safety checks. Opportunities for questions or concerns presented to patient. Patient encouraged to continue to identify a new goal for tomorrow.   R) Patient receptive to interaction with nurse. Patient remains safe on the unit at this time. Patient is resting in bed without complaints. Will continue to monitor.
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
## 2016-08-18 NOTE — Plan of Care (Signed)
Problem: Safety: Goal: Periods of time without injury will increase Outcome: Progressing Patient remains safe on the unit at this time. Patient is on q15 minute safety checks. Fall safety precautions reviewed. Patient able to make needs known.
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
## 2016-08-19 LAB — BASIC METABOLIC PANEL
ANION GAP: 8 (ref 5–15)
BUN: 21 mg/dL — ABNORMAL HIGH (ref 6–20)
CALCIUM: 9.1 mg/dL (ref 8.9–10.3)
CO2: 28 mmol/L (ref 22–32)
Chloride: 102 mmol/L (ref 101–111)
Creatinine, Ser: 0.88 mg/dL (ref 0.61–1.24)
GLUCOSE: 88 mg/dL (ref 65–99)
POTASSIUM: 3.5 mmol/L (ref 3.5–5.1)
SODIUM: 138 mmol/L (ref 135–145)

## 2016-08-19 LAB — TSH: TSH: 1.605 u[IU]/mL (ref 0.350–4.500)

## 2016-08-19 MED ORDER — SERTRALINE HCL 50 MG PO TABS
150.0000 mg | ORAL_TABLET | Freq: Every day | ORAL | Status: DC
  Administered 2016-08-20 – 2016-08-21 (×2): 150 mg via ORAL
  Filled 2016-08-19 (×4): qty 1

## 2016-08-19 NOTE — Progress Notes (Signed)
[facility] MD Progress Note  08/19/2016 4:41 PM Shawn York  MRN:  [id number]  Subjective: Shawn York reports, "I'm doing okay. My medication 'Zoloft was doubled & I took the first dose today. I feel good actually. No side effects".  Objective: Shawn York is seen, chart reviewed. He is alert, oriented x 4. Affect is appropriate. He is visible on the unit & participating in the group milieu. Shawn York states today that being in the mist of the other patient has alerted him that so many people are into substance abuse issues. Others are out there trying to use the system as much as they can. He says he is here to stabilize his mental health & nothing more. 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 He says all he is after, is to get better, get discharged to go home to his wife & kid. He reports improving mood. Denies any medication side effects.  Principal Problem: Major depressive disorder, recurrent episodes  Diagnosis:   Patient Active Problem List   Diagnosis Date Noted  . Major depressive disorder, recurrent episode ([place]) [F33.9] 08/17/2016  . Hypertension [I10] 06/21/2013  . 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  Past Medical History:  Past Medical History:  Diagnosis Date  . Brain cancer ([place]) 02/25/2011   grade III anaplastic ologodendrglioma    Past Surgical History:  Procedure Laterality Date  . CRANIOTOMY FOR TUMOR Right 02/25/2011   Family History:  Family History  Problem Relation Age of Onset  . Cancer Other     brain cancer  . Cancer Father     skin cancer  . Cancer Maternal Grandmother 22    brain cancer   Family Psychiatric  History: See H&P  Social History:  History  Alcohol Use  . 8.4 oz/week  . 14 Cans of beer per week     History  Drug Use  . Frequency: 2.0 times per week  . Types: Marijuana    Social History    Social History  . Marital status: Single    Spouse name: N/A  . Number of children: N/A  . Years of education: N/A   Social History Main Topics  . Smoking status: Former Smoker    Quit date: 02/25/2011  . Smokeless tobacco: Never Used  . Alcohol use 8.4 oz/week    14 Cans of beer per week  . Drug use:     Frequency: 2.0 times per week    Types: Marijuana  . Sexual activity: Yes     Comment: E-cigarette users   Other Topics Concern  . None   Social History Narrative  . None   Additional Social History:    Pain Medications: none Prescriptions: see med list Over the Counter: see med list History of alcohol / drug use?: Yes Longest period of sobriety (when/how long): Denies dependency Negative Consequences of Use:  (None Reported) Withdrawal Symptoms:  (None Reported) Name of Substance 1: Alcohol 1 - Age of First Use: 17 1 - Amount (size/oz): 2-3 drinks (wine or beer)  1 - Frequency: nightly 1 - Duration: For the past 4 months 1 - Last Use / Amount: 08/15/2016 he drank 2 beers  Sleep: 6 hours, improving  Appetite:  Good  Current Medications: Current Facility-Administered Medications  Medication Dose Route Frequency Provider Last Rate Last Dose  . acetaminophen (TYLENOL) tablet 650 mg  650 mg Oral Q6H PRN Encarnacion Slates, NP      . alum & mag hydroxide-simeth (MAALOX/MYLANTA) 200-200-20 MG/5ML suspension 30 mL  30 mL Oral Q4H PRN Encarnacion Slates, NP      . amLODipine (NORVASC) tablet 5 mg  5 mg Oral QHS Encarnacion Slates, NP   5 mg at 08/18/16 2135  . benazepril (LOTENSIN) tablet 20 mg  20 mg Oral QHS Encarnacion Slates, NP   20 mg at 08/18/16 2135  . hydrOXYzine (ATARAX/VISTARIL) tablet 25 mg  25 mg Oral Q6H PRN Encarnacion Slates, NP   25 mg at 08/18/16 2135  . Influenza vac split quadrivalent PF (FLUARIX) injection 0.5 mL  0.5 mL Intramuscular Tomorrow-1000 Fernando A Cobos, MD      . magnesium hydroxide (MILK OF MAGNESIA) suspension 30 mL  30 mL Oral Daily PRN Encarnacion Slates, NP       . Oxcarbazepine (TRILEPTAL) tablet 300 mg  300 mg Oral BID Laverle Hobby, PA-C   300 mg at 08/19/16 9357  . sertraline (ZOLOFT) tablet 100 mg  100 mg Oral Daily Jenne Campus, MD   100 mg at 08/19/16 0177  . traZODone (DESYREL) tablet 50 mg  50 mg Oral QHS PRN Encarnacion Slates, NP   50 mg at 08/18/16 2135   Lab Results:  Results for orders placed or performed during the hospital encounter of 08/17/16 (from the past 48 hour(s))  CBC with Differential/Platelet     Status: Abnormal   Collection Time: 08/18/16  6:19 PM  Result Value Ref Range   WBC 6.5 4.0 - 10.5 K/uL   RBC 4.20 (L) 4.22 - 5.81 MIL/uL   Hemoglobin 13.9 13.0 - 17.0 g/dL   HCT 39.5 39.0 - 52.0 %   MCV 94.0 78.0 - 100.0 fL   MCH 33.1 26.0 - 34.0 pg   MCHC 35.2 30.0 - 36.0 g/dL   RDW 13.1 11.5 - 15.5 %   Platelets 156 150 - 400 K/uL   Neutrophils Relative % 71 %   Neutro Abs 4.6 1.7 - 7.7 K/uL   Lymphocytes Relative 20 %   Lymphs Abs 1.3 0.7 - 4.0 K/uL   Monocytes Relative 8 %   Monocytes Absolute 0.5 0.1 - 1.0 K/uL   Eosinophils Relative 1 %   Eosinophils Absolute 0.1 0.0 - 0.7 K/uL   Basophils Relative 0 %   Basophils Absolute 0.0 0.0 - 0.1 K/uL    Comment: Performed at [facility] metabolic panel     Status: Abnormal   Collection Time: 08/19/16  6:10 AM  Result Value Ref Range   Sodium 138 135 - 145 mmol/L   Potassium 3.5 3.5 - 5.1 mmol/L   Chloride 102 101 - 111 mmol/L   CO2 28 22 - 32 mmol/L   Glucose, Bld 88 65 - 99 mg/dL   BUN 21 (H) 6 - 20 mg/dL   Creatinine, Ser 0.88 0.61 - 1.24 mg/dL   Calcium 9.1 8.9 - 10.3 mg/dL   GFR calc non Af Amer >60 >60 mL/min   GFR calc Af Amer >60 >60 mL/min    Comment: (NOTE) The eGFR has been calculated using the CKD EPI equation. This calculation has not been validated in all clinical situations. eGFR's persistently <60 mL/min signify possible Chronic Kidney Disease.    Anion gap 8 5 - 15    Comment: Performed at [facility]  TSH  Status: None   Collection Time: 08/19/16  6:10 AM  Result Value Ref Range   TSH 1.605 0.350 - 4.500 uIU/mL    Comment: Performed by a 3rd Generation assay with a functional sensitivity of <=0.01 uIU/mL. Performed at [facility]    Blood Alcohol level:  No results found for: [facility]  Metabolic Disorder Labs: No results found for: HGBA1C, MPG No results found for: PROLACTIN No results found for: CHOL, TRIG, HDL, CHOLHDL, VLDL, LDLCALC  Physical Findings: AIMS: Facial and Oral Movements Muscles of Facial Expression: None, normal Lips and Perioral Area: None, normal Jaw: None, normal Tongue: None, normal,Extremity Movements Upper (arms, wrists, hands, fingers): None, normal Lower (legs, knees, ankles, toes): None, normal, Trunk Movements Neck, shoulders, hips: None, normal, Overall Severity Severity of abnormal movements (highest score from questions above): None, normal Incapacitation due to abnormal movements: None, normal Patient's awareness of abnormal movements (rate only patient's report): No Awareness, Dental Status Current problems with teeth and/or dentures?: No Does patient usually wear dentures?: No  CIWA:  CIWA-Ar Total: 1 COWS:     Musculoskeletal: Strength & Muscle Tone: within normal limits Gait & Station: normal Patient leans: N/A  Psychiatric Specialty Exam: Physical Exam  Review of Systems  Psychiatric/Behavioral: Positive for depression.    Blood pressure 98/70, pulse 78, temperature 97.7 F (36.5 C), temperature source Oral, resp. rate 18, height 6' 1" (1.854 m), weight 87.1 kg (192 lb), SpO2 100 %.Body mass index is 25.33 kg/m.  General Appearance: Well Groomed  Eye Contact:  Fair  Speech:  Normal Rate  Volume:  Normal  Mood:  depressed   Affect:  constricted, subtly irritable, but smiles at times appropriately  Thought Process:  Linear  Orientation:  Full (Time, Place, and Person)  Thought Content:  denies  hallucinations, no delusions, not internally preoccupied   Suicidal Thoughts:  No denies any suicidal or self injurious ideations , contracts for safety at this time  Homicidal Thoughts:  denies any homicidal ideations  Memory:  recent and remote grossly intact   Judgement:  Fair  Insight:  Present  Psychomotor Activity:  Normal  Concentration:  Concentration: Good and Attention Span: Good  Recall:  recent and remote grossly intact  Fund of Knowledge:  Good  Language:  Good  Akathisia:  Negative  Handed:  Right  AIMS (if indicated):     Assets:  Desire for Improvement Resilience Social Support  ADL's:  Intact  Cognition:  WNL  Sleep:  Number of Hours: 6     Treatment Plan Summary: the stressors. Daily contact with patient to assess and evaluate symptoms and progress in treatment and Medication management  Reviewed past medical records & treatment plan.   For Depressed mood/anxiety: Will increase Sertraline 150 mg po daily.  For Anxiety disorder: Will continue Lorazepam 1  mg po Q 6 hours prn. Will continue Hydroxyzine 25 mg prn Q 6 hours.  For mood instability: Will continue Trileptal 300 mg bid  For insomnia: Will Trazodone 50 mg po qhs prn.  Other medical issues & concerns: Will continue Norvasc 5 mg daily for HTN Will continue Benazepril 20 mg daily for HTN.  - Continue 15 minutes observation for safety concerns - Encouraged to participate in milieu therapy and group therapy counseling sessions and also work with coping skills -  Develop treatment plan to reduce the need for readmission. -  Psycho-social education regarding self care. - Health care follow up as needed for medical problems. - Restart home  medications where appropriate.  Encarnacion Slates, NP, PMHNP, FNP-BC 08/19/2016, 4:41 PM   Patient seen, Suicide Assessment Completed.  Disposition Plan Reviewed

## 2016-08-19 NOTE — Progress Notes (Signed)
[place] Group Notes:  (Nursing/MHT/Case Management/Adjunct)  Date:  08/19/2016  Time:  10:37 PM  Type of Therapy:  Psychoeducational Skills  Participation Level:  Active  Participation Quality:  Attentive  Affect:  Appropriate  Cognitive:  Appropriate  Insight:  Good  Engagement in Group:  Engaged  Modes of Intervention:  Education  Summary of Progress/Problems: The patient shared in group that he had a good day overall and that he was feeling anxious since he is normally very active. He admits to attending groups and playing basketball with his peers. In terms of the theme for the day, his relapse prevention strategy will be to change some of his habits.   Shawn York S 08/19/2016, 10:37 PM

## 2016-08-19 NOTE — Progress Notes (Signed)
Patient reports sleeping "much better" last night and states the ear plugs and medicine helped a lot. Patient encouraged to continue to verbalize needs. Will continue to monitor.

## 2016-08-19 NOTE — Progress Notes (Signed)
Patient ID: Shawn York, male   DOB: 29-Jun-1976, 40 y.o.   MRN: [id number]  Pt currently presents with a flat affect and depressed behavior. Pt reports to writer that their goal is to "get home to my wife, she misses me." Pt states "I was really stressed out at home, it has been nice to take some time and relax." Pt reports good sleep with current medication regimen.   Pt provided with medications per providers orders. Pt's labs and vitals were monitored throughout the night. Pt supported emotionally and encouraged to express concerns and questions. Pt educated on medications and alternative relaxation techniques. Encouraged patient to set up a plan for anxiety management post discharge.   Pt's safety ensured with 15 minute and environmental checks. Pt currently denies SI/HI and A/V hallucinations. Pt verbally agrees to seek staff if SI/HI or A/VH occurs and to consult with staff before acting on any harmful thoughts. Will continue POC.
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
## 2016-08-19 NOTE — BHH Suicide Risk Assessment (Signed)
[place] INPATIENT:  Family/Significant Other Suicide Prevention Education  Suicide Prevention Education:  Contact Attempts:Maggie Kretzer [phone] (wife) has been identified by the patient as the family member/significant other with whom the patient will be residing, and identified as the person(s) who will aid the patient in the event of a mental health crisis.  With written consent from the patient, two attempts were made to provide suicide prevention education, prior to and/or following the patient's discharge.  We were unsuccessful in providing suicide prevention education.  A suicide education pamphlet was given to the patient to share with family/significant other. CSW was unable to leave voicemail due full mailbox.   Date and time of first attempt:08/18/2016 3:50pm Date and time of second attempt:08/19/2016 2:00 PM   Wray Kearns MSW, [place]  08/19/2016, 1:59 PM

## 2016-08-19 NOTE — BHH Group Notes (Signed)
[facility] LCSW Group Therapy 08/19/2016 1:15 PM Type of Therapy: Group Therapy Participation Level: Active  Participation Quality: Attentive, Sharing and Supportive  Affect: Appropriate  Cognitive: Alert and Oriented  Insight: Developing/Improving and Engaged  Engagement in Therapy: Developing/Improving and Engaged  Modes of Intervention: Clarification, Confrontation, Discussion, Education, Exploration, Limit-setting, Orientation, Problem-solving, Rapport Building, Art therapist, Socialization and Support  Summary of Progress/Problems: The topic for today was feelings about relapse. Pt discussed what relapse prevention is to them and identified triggers that they are on the path to relapse. Pt processed their feeling towards relapse and was able to relate to peers. Pt discussed coping skills that can be used for relapse prevention.    Tilden Fossa, MSW, [place] Clinical Social Worker [facility] [phone]

## 2016-08-19 NOTE — Progress Notes (Signed)
Recreation Therapy Notes  Date: 08/19/16 Time: 0930 Location: 300 Hall Group Room  Group Topic: Stress Management  Goal Area(s) Addresses:  Patient will verbalize importance of using healthy stress management.  Patient will identify positive emotions associated with healthy stress management.   Intervention: Calm App  Activity :  Gratitude Meditation.  LRT introduced the stress management technique of meditation to group.  LRT played the meditation from the Calm app to allow patients to focus on things they should be grateful for and not the things they don't have.  Patients were to follow along as the meditation played to engage in the technique.  Education:  Stress Management, Discharge Planning.   Education Outcome: Acknowledges edcuation/In group clarification offered/Needs additional education  Clinical Observations/Feedback: Pt did not attend group.   Victorino Sparrow, LRT/CTRS         Victorino Sparrow A 08/19/2016 11:46 AM

## 2016-08-19 NOTE — Plan of Care (Signed)
Problem: Activity: Goal: Imbalance in normal sleep/wake cycle will improve Outcome: Progressing Patient reports improvement in his sleep last evening stating, "I have a good night's sleep".

## 2016-08-19 NOTE — Plan of Care (Signed)
Problem: Medication: Goal: Compliance with prescribed medication regimen will improve Outcome: Progressing Patient taking medications as prescribed. Patient educated about medication regimen. Patient verbalizes understanding. Patient without concerns at this time.

## 2016-08-19 NOTE — Progress Notes (Signed)
Data. Patient denies SI/HI/AVH. He is able to contract for safety on the unit and to come to staff prior to acting on any self harm thoughts or feelings. Patient interacting well with staff and other patients. Patient affect has been flat and his mood depressed. He reports on his self assessment 5/10 for depression, 3/10 for hopelessness and 4/10 for anxiety.His goal for today is: "Appeciate the treatment and accept help".  Action. Emotional support and encouragement offered. Education provided on medication, indications and side effect. Q 15 minute checks done for safety. Response. Safety on the unit maintained through 15 minute checks.  Medications taken as prescribed. Attended groups. Remained calm and appropriate through out shift.

## 2016-08-20 NOTE — BHH Group Notes (Signed)
Adult Psychoeducational Group Note  Date:  08/20/2016 Time: 8:30 PM  Group Topic/Focus:  Wrap-Up Group:   The focus of this group is to help patients review their daily goal of treatment and discuss progress on daily workbooks.   Participation Level:  Active  Participation Quality:  Appropriate  Affect:  Appropriate  Cognitive:  Alert  Insight: Good  Engagement in Group:  Engaged  Modes of Intervention:  Discussion and Education  Additional Comments:  Patient reported working on being positive through helping others around him.    Leroy Sea N 08/20/2016, 11:36 PM

## 2016-08-20 NOTE — BHH Counselor (Signed)
Clinical Social Work Note  Spoke with wife for suicide prevention education.  Also discussed how she feels patient is doing, and whether she feels comfortable with him coming home at this point.  Wife states she has spoken with pt and visited, and she feels the break from his stressors and the realization that help is available has been very helpful to pt.  He has told her that he realizes he needs to change his thinking from being negative, and therapy is the place to do that.  Whereas he was previously opposed to it, he is now eager to go.  She feels safe with him coming home at this point.  There was a gun in the home, but it has been removed.   Selmer Dominion, LCSW 08/20/2016, 5:55 PM

## 2016-08-20 NOTE — Progress Notes (Signed)
Shawn York has been up in the dayroom with minimal interaction with peers. He attended group this evening and is hopeful to discharge on tomorrow.He is looking forward to spending time with his family especially his 40 year old for Christmas. Patient currently denies having pain, -si/hi/a/v hall. Support and encouragement offered, safety maintained on unit, will continue to monitor.

## 2016-08-20 NOTE — BHH Suicide Risk Assessment (Signed)
[name] INPATIENT:  Family/Significant Other Suicide Prevention Education  Suicide Prevention Education:  Education Completed; Shawn York [phone] (wife)  (name of family member/significant other) has been identified by the patient as the family member/significant other with whom the patient will be residing, and identified as the person(s) who will aid the patient in the event of a mental health crisis (suicidal ideations/suicide attempt).  With written consent from the patient, the family member/significant other has been provided the following suicide prevention education, prior to the and/or following the discharge of the patient.  The suicide prevention education provided includes the following:  Suicide risk factors  Suicide prevention and interventions  National Suicide Hotline telephone number  [facility] assessment telephone number  [facility] Assistance [place] and/or Residential Mobile Crisis Unit telephone number  Request made of family/significant other to:  Remove weapons (e.g., guns, rifles, knives), all items previously/currently identified as safety concern.    Remove drugs/medications (over-the-counter, prescriptions, illicit drugs), all items previously/currently identified as a safety concern.  The family member/significant other verbalizes understanding of the suicide prevention education information provided.  The family member/significant other agrees to remove the items of safety concern listed above.  There was a gun in the home, but it has been removed. Wife states she has spoken with pt and visited, and she feels the break from his stressors and the realization that help is available has been very helpful to pt.  He has told her that he realizes he needs to change his thinking from being negative, and therapy is the place to do that.  Whereas he was previously opposed to it, he is now eager to go.  She feels safe with  him coming home at this point  Shawn York 08/20/2016, 5:51 PM

## 2016-08-20 NOTE — BHH Group Notes (Signed)
Nursing Psycho-educational Group:   Patient attended and actively participated in nursing group. Cooperative and engaged.  Wants to work on spreading positivity.

## 2016-08-20 NOTE — BHH Group Notes (Signed)
Adult Therapy Group Note  Date:  08/20/2016  Time:  10:00-11:00AM  Group Topic/Focus: Coping with the Holidays  The focus of this group was to share good and bad holiday memories and process how this has affected this holiday season for patients.  Participation Level:  Active  Participation Quality:  Attentive and Sharing  Affect:  Blunted  Cognitive:  Appropriate  Insight: Good  Engagement in Group:  Developing/Improving  Modes of Intervention:  Exercise, Discussion and Support  Additional Comments:  The patient expressed that due to poverty, his family did not celebrate holidays when he was growing up.  He wants to have holiday traditions for his young child.  He was focused on desire to leave today, upset by CSW's statement that the focus of the weekend CSW is groups, not discharge planning.  Selmer Dominion, LCSW 08/20/2016   12:30 PM

## 2016-08-20 NOTE — Progress Notes (Signed)
[facility] MD Progress Note  08/20/2016 3:28 PM Shawn York  MRN:  [id number]  Subjective: patient reports he is feeling better, and at this time less depressed, " mor relaxed ".  States his wife came to visit yesterday and visit went well. At this time focusing on being discharged soon, looking forward to spend holidays with family. Denies medication side effects.  Objective:  I have discussed case with treatment team and have met with patient . As per staff notes, patient has continued to present with a sad affect at times. At this time he  presents improved compared to admission , with improving mood and a fuller range / brighter affect.  At this time denies medication side effects and has tolerated Zoloft titration well thus far. No disruptive or agitated behaviors on unit, going to groups, visible in milieu. As he improves he is focusing on being discharged soon.    Principal Problem: Major depressive disorder, recurrent episodes  Diagnosis:   Patient Active Problem List   Diagnosis Date Noted  . Major depressive disorder, recurrent episode ([place]) [F33.9] 08/17/2016  . Hypertension [I10] 06/21/2013  . 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  Past Medical History:  Past Medical History:  Diagnosis Date  . Brain cancer ([place]) 02/25/2011   grade III anaplastic ologodendrglioma    Past Surgical History:  Procedure Laterality Date  . CRANIOTOMY FOR TUMOR Right 02/25/2011   Family History:  Family History  Problem Relation Age of Onset  . Cancer Other     brain cancer  . Cancer Father     skin cancer  . Cancer Maternal Grandmother 26    brain cancer   Family Psychiatric  History: See H&P  Social History:  History  Alcohol Use  . 8.4 oz/week  . 14 Cans of beer per week     History  Drug Use  . Frequency: 2.0 times per  week  . Types: Marijuana    Social History   Social History  . Marital status: Single    Spouse name: N/A  . Number of children: N/A  . Years of education: N/A   Social History Main Topics  . Smoking status: Former Smoker    Quit date: 02/25/2011  . Smokeless tobacco: Never Used  . Alcohol use 8.4 oz/week    14 Cans of beer per week  . Drug use:     Frequency: 2.0 times per week    Types: Marijuana  . Sexual activity: Yes     Comment: E-cigarette users   Other Topics Concern  . None   Social History Narrative  . None   Additional Social History:    Pain Medications: none Prescriptions: see med list Over the Counter: see med list History of alcohol / drug use?: Yes Longest period of sobriety (when/how long): Denies dependency Negative Consequences of Use:  (None Reported) Withdrawal Symptoms:  (None Reported) Name of Substance 1: Alcohol 1 - Age of First Use: 17 1 - Amount (size/oz): 2-3 drinks (wine or beer)  1 - Frequency: nightly 1 - Duration: For the past 4 months 1 - Last Use / Amount: 08/15/2016 he drank 2 beers  Sleep: improving   Appetite:  Good  Current Medications: Current Facility-Administered Medications  Medication Dose Route Frequency Provider Last Rate Last Dose  . acetaminophen (TYLENOL) tablet 650 mg  650 mg Oral Q6H PRN  Encarnacion Slates, NP      . alum & mag hydroxide-simeth (MAALOX/MYLANTA) 200-200-20 MG/5ML suspension 30 mL  30 mL Oral Q4H PRN Encarnacion Slates, NP      . amLODipine (NORVASC) tablet 5 mg  5 mg Oral QHS Encarnacion Slates, NP   5 mg at 08/19/16 2140  . benazepril (LOTENSIN) tablet 20 mg  20 mg Oral QHS Encarnacion Slates, NP   20 mg at 08/19/16 2141  . hydrOXYzine (ATARAX/VISTARIL) tablet 25 mg  25 mg Oral Q6H PRN Encarnacion Slates, NP   25 mg at 08/19/16 2139  . Influenza vac split quadrivalent PF (FLUARIX) injection 0.5 mL  0.5 mL Intramuscular Tomorrow-1000 Fernando A Cobos, MD      . magnesium hydroxide (MILK OF MAGNESIA) suspension 30 mL  30  mL Oral Daily PRN Encarnacion Slates, NP      . Oxcarbazepine (TRILEPTAL) tablet 300 mg  300 mg Oral BID Laverle Hobby, PA-C   300 mg at 08/20/16 6433  . sertraline (ZOLOFT) tablet 150 mg  150 mg Oral Daily Encarnacion Slates, NP   150 mg at 08/20/16 0844  . traZODone (DESYREL) tablet 50 mg  50 mg Oral QHS PRN Encarnacion Slates, NP   50 mg at 08/19/16 2140   Lab Results:  Results for orders placed or performed during the hospital encounter of 08/17/16 (from the past 48 hour(s))  CBC with Differential/Platelet     Status: Abnormal   Collection Time: 08/18/16  6:19 PM  Result Value Ref Range   WBC 6.5 4.0 - 10.5 K/uL   RBC 4.20 (L) 4.22 - 5.81 MIL/uL   Hemoglobin 13.9 13.0 - 17.0 g/dL   HCT 39.5 39.0 - 52.0 %   MCV 94.0 78.0 - 100.0 fL   MCH 33.1 26.0 - 34.0 pg   MCHC 35.2 30.0 - 36.0 g/dL   RDW 13.1 11.5 - 15.5 %   Platelets 156 150 - 400 K/uL   Neutrophils Relative % 71 %   Neutro Abs 4.6 1.7 - 7.7 K/uL   Lymphocytes Relative 20 %   Lymphs Abs 1.3 0.7 - 4.0 K/uL   Monocytes Relative 8 %   Monocytes Absolute 0.5 0.1 - 1.0 K/uL   Eosinophils Relative 1 %   Eosinophils Absolute 0.1 0.0 - 0.7 K/uL   Basophils Relative 0 %   Basophils Absolute 0.0 0.0 - 0.1 K/uL    Comment: Performed at [facility] metabolic panel     Status: Abnormal   Collection Time: 08/19/16  6:10 AM  Result Value Ref Range   Sodium 138 135 - 145 mmol/L   Potassium 3.5 3.5 - 5.1 mmol/L   Chloride 102 101 - 111 mmol/L   CO2 28 22 - 32 mmol/L   Glucose, Bld 88 65 - 99 mg/dL   BUN 21 (H) 6 - 20 mg/dL   Creatinine, Ser 0.88 0.61 - 1.24 mg/dL   Calcium 9.1 8.9 - 10.3 mg/dL   GFR calc non Af Amer >60 >60 mL/min   GFR calc Af Amer >60 >60 mL/min    Comment: (NOTE) The eGFR has been calculated using the CKD EPI equation. This calculation has not been validated in all clinical situations. eGFR's persistently <60 mL/min signify possible Chronic Kidney Disease.    Anion gap 8 5 - 15    Comment:  Performed at [facility]  TSH     Status: None  Collection Time: 08/19/16  6:10 AM  Result Value Ref Range   TSH 1.605 0.350 - 4.500 uIU/mL    Comment: Performed by a 3rd Generation assay with a functional sensitivity of <=0.01 uIU/mL. Performed at [facility]    Blood Alcohol level:  No results found for: [facility]  Metabolic Disorder Labs: No results found for: HGBA1C, MPG No results found for: PROLACTIN No results found for: CHOL, TRIG, HDL, CHOLHDL, VLDL, LDLCALC  Physical Findings: AIMS: Facial and Oral Movements Muscles of Facial Expression: None, normal Lips and Perioral Area: None, normal Jaw: None, normal Tongue: None, normal,Extremity Movements Upper (arms, wrists, hands, fingers): None, normal Lower (legs, knees, ankles, toes): None, normal, Trunk Movements Neck, shoulders, hips: None, normal, Overall Severity Severity of abnormal movements (highest score from questions above): None, normal Incapacitation due to abnormal movements: None, normal Patient's awareness of abnormal movements (rate only patient's report): No Awareness, Dental Status Current problems with teeth and/or dentures?: No Does patient usually wear dentures?: No  CIWA:  CIWA-Ar Total: 1 COWS:     Musculoskeletal: Strength & Muscle Tone: within normal limits Gait & Station: normal Patient leans: N/A  Psychiatric Specialty Exam: Physical Exam  Review of Systems  Psychiatric/Behavioral: Positive for depression.    Blood pressure 111/83, pulse 70, temperature 97.6 F (36.4 C), resp. rate 16, height 6' 1"  (1.854 m), weight 87.1 kg (192 lb), SpO2 100 %.Body mass index is 25.33 kg/m.  General Appearance: Well Groomed  Eye Contact:  good   Speech:  Normal Rate  Volume:  Normal  Mood:  less depressed, more reactive affect   Affect:  more reactive today , smiles at times appropriately   Thought Process:  Linear  Orientation:  Full (Time, Place, and Person)   Thought Content:  denies hallucinations, no delusions, not internally preoccupied   Suicidal Thoughts:  No denies any suicidal or self injurious ideations , contracts for safety at this time  Homicidal Thoughts:  denies any homicidal ideations  Memory:  recent and remote grossly intact   Judgement:  improving  Insight:  Present  Psychomotor Activity:  Normal  Concentration:  Concentration: Good and Attention Span: Good  Recall:  recent and remote grossly intact  Fund of Knowledge:  Good  Language:  Good  Akathisia:  Negative  Handed:  Right  AIMS (if indicated):     Assets:  Desire for Improvement Resilience Social Support  ADL's:  Intact  Cognition:  WNL  Sleep:  Number of Hours: 6      Assessment - patient reports partial improvement ,states he is feeling better, and does exhibit a fuller range of affect. At this time denies suicidal ideations and is future oriented. Tolerating Zoloft titration well . Treatment Plan Summary: the stressors. Continue to encourage group and milieu participation to work on coping skills and symptom reduction Daily contact with patient to assess and evaluate symptoms and progress in treatment and Medication management  For Depressed mood/anxiety: Continue  Sertraline 150 mg po daily.  For Anxiety disorder: Will continue Lorazepam 1  mg po Q 6 hours prn. Will continue Hydroxyzine 25 mg prn Q 6 hours.  For mood instability: Will continue Trileptal 300 mg bid  For insomnia: Will Trazodone 50 mg po qhs prn.  Other medical issues & concerns: Will continue Norvasc 5 mg daily for HTN Will continue Benazepril 20 mg daily for HTN.  Treatment team working on disposition [place], [place], MD 08/20/2016, 3:28 PM   Patient  ID: [name], male   DOB: 1975/12/05, 40 y.o.   MRN: [id number]

## 2016-08-20 NOTE — Progress Notes (Signed)
Patient ID: Shawn York, male   DOB: December 23, 1975, 40 y.o.   MRN: [id number]    D: Pt has been very flat and depressed on the unit today. Pt reported that his depression was a 3, his hopelessness was a 3, and his anxiety was a 3. Pt reported that he just wanted to get home to his wife and family. Pt reported being negative SI/HI, no AH/VH noted. A: 15 min checks continued for patient safety. R: Pt safety maintained.

## 2016-08-21 DIAGNOSIS — Z87891 Personal history of nicotine dependence: Secondary | ICD-10-CM

## 2016-08-21 DIAGNOSIS — F332 Major depressive disorder, recurrent severe without psychotic features: Secondary | ICD-10-CM

## 2016-08-21 LAB — 10-HYDROXYCARBAZEPINE: Triliptal/MTB(Oxcarbazepin): 6 ug/mL — ABNORMAL LOW (ref 10–35)

## 2016-08-21 MED ORDER — OXCARBAZEPINE 300 MG PO TABS: 300.0000 mg | ORAL_TABLET | Freq: Two times a day (BID) | ORAL | 0 refills | Status: DC

## 2016-08-21 MED ORDER — BENAZEPRIL HCL 20 MG PO TABS: 20.0000 mg | ORAL_TABLET | Freq: Every day | ORAL | 0 refills | Status: DC

## 2016-08-21 MED ORDER — HYDROXYZINE HCL 25 MG PO TABS: 25.0000 mg | ORAL_TABLET | Freq: Four times a day (QID) | ORAL | 0 refills | Status: DC | PRN

## 2016-08-21 MED ORDER — SERTRALINE HCL 50 MG PO TABS: 150.0000 mg | ORAL_TABLET | Freq: Every day | ORAL | 0 refills | Status: DC

## 2016-08-21 MED ORDER — TRAZODONE HCL 50 MG PO TABS: 50.0000 mg | ORAL_TABLET | Freq: Every evening | ORAL | 0 refills | Status: DC | PRN

## 2016-08-21 NOTE — Progress Notes (Signed)
Patient ID: Shawn York, male   DOB: 24-May-1976, 40 y.o.   MRN: [id number]   Pt was discharged home with girlfriend. Pt was given his discharge instructions, he did reported that one of his appointments were missing off of his discharge paperwork. Pt reported that he has a followup appointment on 09/01/15 with Thayer Headings NP for medication management. Pt reported that his depression was a 2, his hopelessness was a 2, and his anxiety was a 2. Pt reported that he was negative SI/HI, no AH/VH noted.

## 2016-08-21 NOTE — BHH Suicide Risk Assessment (Signed)
[facility] Discharge Suicide Risk Assessment   Principal Problem: Major depressive disorder, recurrent episode [facility]) Discharge Diagnoses:  Patient Active Problem List   Diagnosis Date Noted  . Major depressive disorder, recurrent episode ([place]) [F33.9] 08/17/2016  . Hypertension [I10] 06/21/2013  . 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 rate 18, height 6\' 1"  (1.854 m), weight 87.1 kg (192 lb), SpO2 100 %.Body mass index is 25.33 kg/m.  General Appearance: Well Groomed  Eye Contact::  Good  Speech:  Normal Rate40  Volume:  Normal  Mood:  denies depression, states mood is " OK" today  Affect:  Appropriate and more reactive   Thought Process:  Linear  Orientation:  Full (Time, Place, and Person)  Thought Content:  denies hallucinations, no delusions, not internally preoccupied   Suicidal Thoughts:  No denies any suicidal  , self injurious ideations, or homicidal ideations  Homicidal Thoughts:  No  Memory:  recent and remote grossly intact   Judgement:  Other:  improved   Insight:  improved   Psychomotor Activity:  Normal  Concentration:  Good  Recall:  Good  Fund of Knowledge:Good  Language: Good  Akathisia:  Negative  Handed:  Right  AIMS (if indicated):     Assets:  Communication Skills Desire for Improvement Resilience  Sleep:  Number of Hours: 5.75  Cognition: WNL  ADL's:  Intact   Mental Status Per Nursing Assessment::   On Admission:  Suicidal ideation indicated by patient, Self-harm thoughts  Demographic Factors:  40 year old married male, employed   Loss Factors: Job/ school related stressors, distant relationship with  parents   Historical Factors: History of depression,no history of suicide attempts,  history of brain tumor ( currently in remission)   Risk Reduction Factors:   Sense of responsibility to family, Employed, Living with another person, especially a relative, Positive social support and Positive coping skills or problem solving skills  Continued Clinical Symptoms:  At this time patient is alert, attentive, well groomed,  well related , pleasant, mood is improved, denies feeling depressed at this time, affect is appropriate and reactive, no thought disorder, no suicidal or self injurious ideations , no hallucinations , no delusions, future oriented. Behavior on unit in good control  Denies medication side effects  Cognitive Features That Contribute To Risk:  No gross cognitive deficits noted upon discharge. Is alert , attentive, and oriented x 3    Suicide Risk:  Mild:  Suicidal ideation of limited frequency, intensity, duration, and specificity.  There are no identifiable plans, no associated intent, mild dysphoria and related symptoms, good self-control (both objective and subjective assessment), few other risk factors, and identifiable protective factors, including available and accessible social support.  Follow-up Information    [facility] Eulas Post for medication management. Go on 08/31/2017.   Why:  9:00 AM appointment has been set by patient Contact information: [address], [place]  [address]  [phone]        [facility]-Dr. Ardeen Jourdain for therapy. Go on 09/14/2016.   Why:  11:00 AM appointment has been set by patient Contact information: [address] [place], [place]  [address]  [phone]  [phone]           Plan Of Care/Follow-up recommendations:  Activity:  as tolerated Diet:  Regular Tests:  NA Other:  see below   Patient is leaving unit in good spirits  Plans to return home  Plans to  follow up as above  Follows up with Dr. Phillips Hay at [facility] for ongoing monitoring of Brain Tumor history   Neita Garnet, MD 08/21/2016, 10:47 AM

## 2016-08-21 NOTE — Discharge Summary (Signed)
Physician Discharge Summary Note  Patient:  Shawn York is an 40 y.o., male MRN:  [id number] DOB:  19-Jun-1976 Patient phone:  [phone] (home)  Patient address:   [place] [address],  Total Time spent with patient: 30 minutes  Date of Admission:  08/17/2016 Date of Discharge: 08/21/2016  Reason for Admission: Per HPI-40 year old married male.States he has been going to marital therapy and during a recent session he expressed having thoughts of shooting self, at which time he was encouraged to come to ED, which he did voluntarily. States he does not think he had an actual intention of hurting self, and has had these suicidal ideations " on and off " for several months. 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 Major depressive disorder, recurrent episode ([place]) [F33.9] 08/17/2016  . Hypertension [I10] 06/21/2013  . 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 Brain cancer ([place]) 02/25/2011   grade III anaplastic ologodendrglioma    Past Surgical History:  Procedure Laterality Date  . CRANIOTOMY FOR TUMOR Right 02/25/2011   Family History:  Family History  Problem Relation Age of Onset  . Cancer Other     brain cancer  . Cancer Father     skin cancer  . Cancer Maternal Grandmother 43    brain cancer   Family Psychiatric  History:  Social History:  History  Alcohol Use  . 8.4 oz/week  . 14 Cans of beer per week      History  Drug Use  . Frequency: 2.0 times per week  . Types: Marijuana    Social History   Social History  . Marital status: Single    Spouse name: N/A  . Number of children: N/A  . Years of education: N/A   Social History Main Topics  . Smoking status: Former Smoker    Quit date: 02/25/2011  . Smokeless tobacco: Never Used  . Alcohol use 8.4 oz/week    14 Cans of beer per week  . Drug use:     Frequency: 2.0 times per week    Types: Marijuana  . Sexual activity: Yes     Comment: E-cigarette users   Other Topics Concern  . None   Social History Narrative  . None    Hospital Course:  Shawn York was admitted for Major depressive disorder, recurrent episode ([place]) and crisis management.  Pt was treated discharged with the medications listed below under Medication List.  Medical problems were identified and treated as needed.  Home medications were restarted as appropriate.  Improvement was monitored by observation and Shawn York 's daily report of symptom reduction.  Emotional and mental status was monitored by daily self-inventory reports completed by Shawn York and clinical staff.         Shawn York was evaluated by the treatment team for stability and plans for continued recovery upon discharge. Shawn York 's motivation was an integral factor for scheduling further treatment. Employment, transportation, bed availability, health status, family  support, and any pending legal issues were also considered during hospital stay. Pt was offered further treatment options upon discharge including but not limited to Residential, Intensive Outpatient, and Outpatient treatment.  Shawn York will follow up with the services as listed below under Follow Up Information.     Upon completion of this admission the patient was both mentally and medically stable for discharge denying suicidal/homicidal ideation, auditory/visual/tactile hallucinations, delusional thoughts and paranoia.    Shawn York responded well to treatment with Zoloft 150 mg, Trileptal 300 mg and trazodone  50 mg without adverse effects. Pt demonstrated improvement without reported or observed adverse effects to the point of stability appropriate for outpatient management. Pertinent labs include: CBC, BMP for which outpatient follow-up is necessary for lab recheck as mentioned below. Reviewed CBC, CMP, BAL, and UDS; all unremarkable aside from noted exceptions.   Physical Findings: AIMS: Facial and Oral Movements Muscles of Facial Expression: None, normal Lips and Perioral Area: None, normal Jaw: None, normal Tongue: None, normal,Extremity Movements Upper (arms, wrists, hands, fingers): None, normal Lower (legs, knees, ankles, toes): None, normal, Trunk Movements Neck, shoulders, hips: None, normal, Overall Severity Severity of abnormal movements (highest score from questions above): None, normal Incapacitation due to abnormal movements: None, normal Patient's awareness of abnormal movements (rate only patient's report): No Awareness, Dental Status Current problems with teeth and/or dentures?: No Does patient usually wear dentures?: No  CIWA:  CIWA-Ar Total: 1 COWS:     Musculoskeletal: Strength & Muscle Tone: within normal limits Gait & Station: normal Patient leans: N/A  Psychiatric Specialty Exam: Physical Exam  Nursing note and vitals reviewed. Constitutional: He is oriented to person, place, and time. He appears well-developed.  Cardiovascular: Normal rate.   Neurological: He is alert and oriented to person, place, and time.  Psychiatric: He has a normal mood and affect. His behavior is normal.    Review of Systems  Psychiatric/Behavioral: Negative for depression (stable) and suicidal ideas. The patient is not nervous/anxious (stable).     Blood pressure 109/76, pulse 85, temperature 98 F (36.7 C), resp. rate 18, height 6\' 1"  (1.854 m), weight 87.1 kg (192 lb), SpO2 100 %.Body mass index is  25.33 kg/m.  Have you used any form of tobacco in the last 30 days? (Cigarettes, Smokeless Tobacco, Cigars, and/or Pipes): No  Has this patient used any form of tobacco in the last 30 days? (Cigarettes, Smokeless Tobacco, Cigars, and/or Pipes)  Yes, A prescription for an FDA-approved tobacco cessation medication was offered at discharge and the patient refused  Blood Alcohol level:  No results found for: [facility]  Metabolic Disorder Labs:  No results found for: HGBA1C, MPG No results found for: PROLACTIN No results found for: CHOL, TRIG, HDL, CHOLHDL, VLDL, LDLCALC  See Psychiatric Specialty Exam and Suicide Risk Assessment completed by Attending Physician prior to discharge.  Discharge destination:  Home  Is patient on multiple antipsychotic therapies at discharge:  No   Has Patient had three or more failed trials of antipsychotic monotherapy by history:  No  Recommended Plan for Multiple Antipsychotic Therapies: NA  Discharge Instructions    Diet - low sodium heart healthy    Complete by:  As directed    Discharge instructions    Complete by:  As directed    Take all medications as prescribed. Keep all follow-up appointments as scheduled.  Do not consume alcohol or use illegal drugs while on prescription medications. Report any adverse effects from your medications to your primary  care provider promptly.  In the event of recurrent symptoms or worsening symptoms, call 911, a crisis hotline, or go to the nearest emergency department for evaluation.   Increase activity slowly    Complete by:  As directed      Allergies as of 08/21/2016      Reactions   Contrast Media [iodinated Diagnostic Agents] Rash   Penicillins Shortness Of Breath   Has patient had a PCN reaction causing immediate rash, facial/tongue/throat swelling, SOB or lightheadedness with hypotension: Yes Has patient had a PCN reaction causing severe rash involving mucus membranes or skin necrosis: No Has patient had a  PCN reaction that required hospitalization No Has patient had a PCN reaction occurring within the last 10 years: No If all of the above answers are "NO", then may proceed with Cephalosporin use.   Shellfish Allergy Rash      Medication List    STOP taking these medications   ibuprofen 200 MG tablet Commonly known as:  ADVIL,MOTRIN     TAKE these medications     Indication  amLODipine-benazepril 5-20 MG capsule Commonly known as:  LOTREL Take 1 capsule by mouth 2 (two) times daily. What changed:  when to take this  Indication:  High Blood Pressure Disorder   benazepril 20 MG tablet Commonly known as:  LOTENSIN Take 1 tablet (20 mg total) by mouth at bedtime.  Indication:  High Blood Pressure Disorder   EPINEPHrine 0.3 mg/0.3 mL Soaj injection Commonly known as:  EPI-PEN Inject 0.3 mg into the muscle once. As needed for severe allergic reaction  Indication:  Life-Threatening Allergic Reaction   hydrOXYzine 25 MG tablet Commonly known as:  ATARAX/VISTARIL Take 1 tablet (25 mg total) by mouth every 6 (six) hours as needed for anxiety.  Indication:  Anxiety Neurosis   Oxcarbazepine 300 MG tablet Commonly known as:  TRILEPTAL Take 1 tablet (300 mg total) by mouth 2 (two) times daily.  Indication:  Manic-Depression   sertraline 50 MG tablet Commonly known as:  ZOLOFT Take 3 tablets (150 mg total) by mouth daily. Start taking on:  08/22/2016 What changed:  how much to take  Indication:  Major Depressive Disorder   traZODone 50 MG tablet Commonly known as:  DESYREL Take 1 tablet (50 mg total) by mouth at bedtime as needed for sleep.  Indication:  Trouble Sleeping      Follow-up Information    [facility] for medication management. Go on 08/31/2017.   Why:  9:00 AM appointment has been set by patient Contact information: [address], [place]  [address]  [phone]        [facility]-Dr. Ardeen Jourdain for therapy. Go on 09/14/2016.   Why:  11:00 AM appointment has been set by patient Contact information: [address] [place], [place]  [address]  [phone]           Follow-up recommendations:  Activity:  as tolerated  Diet:  heart healthy  Comments:  Take all medications as prescribed. Keep all follow-up appointments as scheduled.  Do not consume alcohol or use illegal drugs while on prescription medications. Report any adverse effects from your medications to your primary care provider promptly.  In the event of recurrent symptoms or worsening symptoms, call 911, a crisis hotline, or go to the nearest emergency department for evaluation.   Signed: Derrill Center, NP 08/21/2016, 9:53 AM   Patient seen, Suicide Assessment Completed.  Disposition Plan  Reviewed

## 2016-08-21 NOTE — [facility] Group Notes (Signed)
The focus of this group is to educate the patient on the purpose and policies of crisis stabilization and provide a format to answer questions about their admission.  The group details unit policies and expectations of patients while admitted.  Patient attended nurse education orientation group this morning.  Patient actively participated and had appropriate affect.  Patient was alert.  Patient had appropriate insight and appropriate engagement.  Today patient will work on 3 goals for discharge.

## 2016-08-21 NOTE — Progress Notes (Signed)
  [facility] Adult Case Management Discharge Plan :  Will you be returning to the same living situation after discharge:  Yes,  home with wife At discharge, do you have transportation home?: Yes,  family Do you have the ability to pay for your medications: Yes,  no issues  Release of information consent forms completed and in the chart;  Patient's signature needed at discharge.  Patient to Follow up at: Follow-up Information    [facility] for medication management. Go on 08/31/2017.   Why:  9:00 AM appointment has been set by patient Contact information: [address] [place], [place]  [address]  [phone]        [facility]-Dr. Ardeen Jourdain for therapy. Go on 09/14/2016.   Why:  11:00 AM appointment has been set by patient Contact information: [address], [place]  [address]  [phone]           Next level of care provider has access to [place] and Suicide Prevention discussed: Yes,  with wife  Have you used any form of tobacco in the last 30 days? (Cigarettes, Smokeless Tobacco, Cigars, and/or Pipes): No  Has patient been referred to the Quitline?: N/A patient is not a smoker  Patient has been referred for addiction treatment: N/A  [name] 08/21/2016, 11:18 AM

## 2016-08-21 NOTE — [facility] Group Notes (Signed)
Adult Therapy Group Note  Date:  08/21/2016 Time: 10:00-11:00AM  Group Topic/Focus:  In group today we briefly discussed things that patients have enjoyed about the holidays.  We then listened to music that patients requested, and they shared what each song meant to them.   Participation Level:  Active  Participation Quality:  Attentive and Sharing  Affect:  Blunted and Depressed  Cognitive:  Appropriate  Insight: Improving  Engagement in Group:  Limited  Modes of Intervention:  Activity and Exploration  Additional Comments:  Pt said he does not enjoy anything about the holidays,  But then was able to identify that as his child matures, he can see the child start to notice Christmas.  [name] 08/21/2016, 1:45 PM

## 2016-09-07 NOTE — Clinical Social Work Note (Signed)
Call from pt requesting fax # for him to transmit "form that I need completed for my insurance."  CSW returned call to pt [phone]) and provided SW fax #.    Edwyna Shell, LCSW Lead Clinical Social Worker Phone:  [phone]

## 2016-11-15 DIAGNOSIS — F332 Major depressive disorder, recurrent severe without psychotic features: Secondary | ICD-10-CM | POA: Diagnosis not present

## 2016-11-29 DIAGNOSIS — F332 Major depressive disorder, recurrent severe without psychotic features: Secondary | ICD-10-CM | POA: Diagnosis not present

## 2016-12-06 DIAGNOSIS — F332 Major depressive disorder, recurrent severe without psychotic features: Secondary | ICD-10-CM | POA: Diagnosis not present

## 2016-12-20 DIAGNOSIS — F332 Major depressive disorder, recurrent severe without psychotic features: Secondary | ICD-10-CM | POA: Diagnosis not present

## 2017-01-03 DIAGNOSIS — F4310 Post-traumatic stress disorder, unspecified: Secondary | ICD-10-CM | POA: Diagnosis not present

## 2017-01-03 DIAGNOSIS — F331 Major depressive disorder, recurrent, moderate: Secondary | ICD-10-CM | POA: Diagnosis not present

## 2017-01-10 DIAGNOSIS — F331 Major depressive disorder, recurrent, moderate: Secondary | ICD-10-CM | POA: Diagnosis not present

## 2017-01-11 DIAGNOSIS — F0789 Other personality and behavioral disorders due to known physiological condition: Secondary | ICD-10-CM | POA: Diagnosis not present

## 2017-01-11 DIAGNOSIS — C712 Malignant neoplasm of temporal lobe: Secondary | ICD-10-CM | POA: Diagnosis not present

## 2017-01-11 DIAGNOSIS — F418 Other specified anxiety disorders: Secondary | ICD-10-CM | POA: Diagnosis not present

## 2017-01-11 DIAGNOSIS — G40909 Epilepsy, unspecified, not intractable, without status epilepticus: Secondary | ICD-10-CM | POA: Diagnosis not present

## 2017-01-11 DIAGNOSIS — F09 Unspecified mental disorder due to known physiological condition: Secondary | ICD-10-CM | POA: Diagnosis not present

## 2017-01-11 DIAGNOSIS — H9319 Tinnitus, unspecified ear: Secondary | ICD-10-CM | POA: Diagnosis not present

## 2017-01-31 DIAGNOSIS — F331 Major depressive disorder, recurrent, moderate: Secondary | ICD-10-CM | POA: Diagnosis not present

## 2017-02-06 DIAGNOSIS — F331 Major depressive disorder, recurrent, moderate: Secondary | ICD-10-CM | POA: Diagnosis not present

## 2017-02-11 DIAGNOSIS — M25562 Pain in left knee: Secondary | ICD-10-CM | POA: Diagnosis not present

## 2017-03-02 DIAGNOSIS — F331 Major depressive disorder, recurrent, moderate: Secondary | ICD-10-CM | POA: Diagnosis not present

## 2017-03-09 DIAGNOSIS — F331 Major depressive disorder, recurrent, moderate: Secondary | ICD-10-CM | POA: Diagnosis not present

## 2017-03-30 DIAGNOSIS — F331 Major depressive disorder, recurrent, moderate: Secondary | ICD-10-CM | POA: Diagnosis not present

## 2017-04-05 DIAGNOSIS — F331 Major depressive disorder, recurrent, moderate: Secondary | ICD-10-CM | POA: Diagnosis not present

## 2017-04-12 DIAGNOSIS — M545 Low back pain: Secondary | ICD-10-CM | POA: Diagnosis not present

## 2017-04-27 DIAGNOSIS — F331 Major depressive disorder, recurrent, moderate: Secondary | ICD-10-CM | POA: Diagnosis not present

## 2017-05-08 DIAGNOSIS — F331 Major depressive disorder, recurrent, moderate: Secondary | ICD-10-CM | POA: Diagnosis not present

## 2017-05-22 DIAGNOSIS — F331 Major depressive disorder, recurrent, moderate: Secondary | ICD-10-CM | POA: Diagnosis not present

## 2017-05-26 DIAGNOSIS — F331 Major depressive disorder, recurrent, moderate: Secondary | ICD-10-CM | POA: Diagnosis not present

## 2017-06-07 DIAGNOSIS — F331 Major depressive disorder, recurrent, moderate: Secondary | ICD-10-CM | POA: Diagnosis not present

## 2017-06-26 DIAGNOSIS — F331 Major depressive disorder, recurrent, moderate: Secondary | ICD-10-CM | POA: Diagnosis not present

## 2017-06-27 DIAGNOSIS — F331 Major depressive disorder, recurrent, moderate: Secondary | ICD-10-CM | POA: Diagnosis not present

## 2017-07-04 DIAGNOSIS — F331 Major depressive disorder, recurrent, moderate: Secondary | ICD-10-CM | POA: Diagnosis not present

## 2017-07-06 DIAGNOSIS — F329 Major depressive disorder, single episode, unspecified: Secondary | ICD-10-CM | POA: Diagnosis not present

## 2017-07-06 DIAGNOSIS — Z125 Encounter for screening for malignant neoplasm of prostate: Secondary | ICD-10-CM | POA: Diagnosis not present

## 2017-07-06 DIAGNOSIS — Z23 Encounter for immunization: Secondary | ICD-10-CM | POA: Diagnosis not present

## 2017-07-06 DIAGNOSIS — I10 Essential (primary) hypertension: Secondary | ICD-10-CM | POA: Diagnosis not present

## 2017-07-10 DIAGNOSIS — F331 Major depressive disorder, recurrent, moderate: Secondary | ICD-10-CM | POA: Diagnosis not present

## 2017-07-12 DIAGNOSIS — G253 Myoclonus: Secondary | ICD-10-CM | POA: Diagnosis not present

## 2017-07-12 DIAGNOSIS — C712 Malignant neoplasm of temporal lobe: Secondary | ICD-10-CM | POA: Diagnosis not present

## 2017-07-12 DIAGNOSIS — G479 Sleep disorder, unspecified: Secondary | ICD-10-CM | POA: Diagnosis not present

## 2017-07-17 DIAGNOSIS — F331 Major depressive disorder, recurrent, moderate: Secondary | ICD-10-CM | POA: Diagnosis not present

## 2017-07-24 DIAGNOSIS — F331 Major depressive disorder, recurrent, moderate: Secondary | ICD-10-CM | POA: Diagnosis not present

## 2017-07-28 DIAGNOSIS — F331 Major depressive disorder, recurrent, moderate: Secondary | ICD-10-CM | POA: Diagnosis not present

## 2017-07-31 DIAGNOSIS — F331 Major depressive disorder, recurrent, moderate: Secondary | ICD-10-CM | POA: Diagnosis not present

## 2017-08-09 DIAGNOSIS — F331 Major depressive disorder, recurrent, moderate: Secondary | ICD-10-CM | POA: Diagnosis not present

## 2017-08-15 DIAGNOSIS — F331 Major depressive disorder, recurrent, moderate: Secondary | ICD-10-CM | POA: Diagnosis not present

## 2017-08-30 DIAGNOSIS — G4719 Other hypersomnia: Secondary | ICD-10-CM | POA: Diagnosis not present

## 2017-09-04 DIAGNOSIS — F331 Major depressive disorder, recurrent, moderate: Secondary | ICD-10-CM | POA: Diagnosis not present

## 2017-09-11 DIAGNOSIS — F331 Major depressive disorder, recurrent, moderate: Secondary | ICD-10-CM | POA: Diagnosis not present

## 2017-09-18 DIAGNOSIS — J069 Acute upper respiratory infection, unspecified: Secondary | ICD-10-CM | POA: Diagnosis not present

## 2017-09-19 DIAGNOSIS — F331 Major depressive disorder, recurrent, moderate: Secondary | ICD-10-CM | POA: Diagnosis not present

## 2017-09-25 DIAGNOSIS — F331 Major depressive disorder, recurrent, moderate: Secondary | ICD-10-CM | POA: Diagnosis not present

## 2017-09-26 ENCOUNTER — Encounter: Payer: Self-pay | Admitting: Neurology

## 2017-09-27 ENCOUNTER — Ambulatory Visit (INDEPENDENT_AMBULATORY_CARE_PROVIDER_SITE_OTHER): Payer: Managed Care, Other (non HMO) | Admitting: Neurology

## 2017-09-27 ENCOUNTER — Encounter: Payer: Self-pay | Admitting: Neurology

## 2017-09-27 VITALS — BP 128/87 | HR 65 | Ht 73.0 in | Wt 203.0 lb

## 2017-09-27 DIAGNOSIS — G4750 Parasomnia, unspecified: Secondary | ICD-10-CM

## 2017-09-27 DIAGNOSIS — G4751 Confusional arousals: Secondary | ICD-10-CM | POA: Diagnosis not present

## 2017-09-27 DIAGNOSIS — F4310 Post-traumatic stress disorder, unspecified: Secondary | ICD-10-CM

## 2017-09-27 DIAGNOSIS — C719 Malignant neoplasm of brain, unspecified: Secondary | ICD-10-CM | POA: Insufficient documentation

## 2017-09-27 DIAGNOSIS — F515 Nightmare disorder: Secondary | ICD-10-CM | POA: Diagnosis not present

## 2017-09-27 NOTE — Patient Instructions (Addendum)
Please remember to try to maintain good sleep hygiene, which means: Keep a regular sleep and wake schedule, try not to exercise or have a meal within 2 hours of your bedtime, try to keep your bedroom conducive for sleep, that is, cool and dark, without light distractors such as an illuminated alarm clock, and refrain from watching TV right before sleep or in the middle of the night and do not keep the TV or radio on during the night. Also, try not to use or play on electronic devices at bedtime, such as your cell phone, tablet PC or laptop. If you like to read at bedtime on an electronic device, try to dim the background light as much as possible. Do not eat in the middle of the night.  Refrain from late caffeine intake and alcohol.   We will request a sleep study with parasomnia montage and seizure EEG montage. Medication to be brought by patient to the sleep lab.  We will call you with the sleep study results .

## 2017-09-27 NOTE — Progress Notes (Addendum)
SLEEP MEDICINE CLINIC   Provider:  Larey York, M D  Primary Care Physician:  Shawn Pepper, MD   Referring Provider: London Pepper, MD , Shawn Settle, MD and [facility].   Chief Complaint  Patient presents with  . New Patient (Initial Visit)    pt alone, rm 10. pt neuro onc wants to verify if the patient is having any apnea and sz activity during sleep. pt is told he snores in sleep worse when on his back. he has also been told that he hold his breath when he is ahving a nightmare (due to PTSD)    HPI:  Shawn York is a 42 y.o. 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  He developed sleep walking soon after- he was sometimes gently guided back to bed by his wife who would find him in a place of the house after he left the bed to go to the bathroom, He has not been disoriented to his homes layout, he knows where the bathroom is and he has not gotten lost looking for it, he just doesn't come back to the bedroom, but always stays in his house .He is amnestic for these events. The spells have not become more frequent, he believes.  He craved a more sound and restful sleep. Doxazosin has changed his frequent night mares, attributed to PTSD since childhood.  His wife has witnessed hyperventilation as well as breath-holding spells that seem to be related to the dream underlying his sleep at that time.  He believes that he may hold his breath as not to be detected by a perceived intruder ( nightmare) , as well as hyperventilating when he is inhis dream physically active or anxious.  He talks in his sleep. He has kicked and boxed, but never injured himself or another person ( wife) he has thrown his sheets of the bed, has  jumped out of bed, never fell out of bed and never broke objects on his nightstand.  He has seen Shawn Shawn York 3 years ago, and Shawn Shawn York performed a NCV and EMG on 03-12-2014, related to a complaint of  Upper extremity numbness, involving the ulnar aspect of both upper extremities. Shawn. Jannifer York found no cervical radiculopathy, evidence of neuropathy, no muscle atrophy.  After his [facility] neuro -oncologist Shawn York recommended a sleep study his Shawn York primary care physician contacted Shawn. Nehemiah York, who then referred to [place] for a special montage sleep study with full EEG.   He stated that the patient has mild to moderate excessive daytime sleepiness and sometimes witnessed apnea.  He has a high probability of having obstructive sleep apnea and also of having PTSD with nightmares disorder, some insomnia and a higher risk of having seizures.  Finally he has dream enactment which could also be due to REM behavior disorder, fragmentation of sleep for other physiological reason or PTSD.  The PSG should be performed with a full EEG montage, I will at that this montage should be a double banana.  Sleep habits are as follows: Mr. Shawn York works 11 PM, off work on Wednesday and works again until 11 PM on Thursday and Friday. The patient usually is in bed by around midnight and falls asleep at that  time.  The bedroom is cool quiet and dark and he shares a bedroom with his wife.  He sleeps on a flat mattress, 2 pillows used to cradle his head but do not prop him up.  He prefers to sleep on his side. He wakes up between 3 and 5 AM for one bathroom break, usually upon returning to bed can sleep for the rest of the night.  He wakes up in the morning at about 7 AM and takes his 28-year-old son to preschool.  He reports that he does not dream every night, and that he usually does not continue to dream about the same issues or situations over several dream periods.  Sometimes when he tries to sleep late, he may have more  dreams just at the time of between wake up periods. He feels always groggy in the morning and it takes a while to wake up, he also finds his sleep not as restful and sound as it used to be.  He does not report headaches, usually is not nauseated or dizzy, some mornings he will have a dry mouth but this is not frequent.  Daytime sleepiness has interfered with his work schedule, he is currently on the following medications Trileptal, ibuprofen, doxazosin 4 mg tablets once a day, BuSpar 30 mg tablets, twice a day; Trental X 20 mg and Lotrel.  He also has an epinephrine pen and allergies to penicillin and shellfish/ iodine.   Medical history of the family : The patient's father snores but has not been diagnosed with apnea, and there is nobody else in his immediate family or black relation that has a documented sleep disorder. Younger brother undiagnosed of PTSD, but has psychiatric problems.   Social history:  Married, son is 59 years old. patient works at [employer] in [place], until [employer] on [place], Tue, and again on Thursday and Friday. Off on Wednesdays. Second child on the way.  He drinks about 2 beers a night, he does not use tobacco products of any kind, drinks 1-2 caffeinated coffees in the morning and iced tea in the afternoon.  Review of Systems: Out of a complete 14 system review, the patient complains of only the following symptoms, and all other reviewed systems are negative.  Hypersomnia can be related to the location of the brain tumor in the right hemisphere.   Sleepwalking beginning in 2012 with the  diagnosis of a right temporal anaplastic oligodendroglioma, he underwent partial resection, radiation, and chemotherapy with Temodar and Avastin,   InSeptember 2014 he had some numbness in both hands involving the ulnar aspect. NCV and EMG were normal.    PTSD- diagnosed in childhood, unrelated to Tumor.  He is currently treated by Shawn York. He "never slept well" , even as a young  child. Insomnia, frequent nightmares since childhood. " Feeling unsafe at night ". He would sleep under the bed, in a closet and behind the bed.   There were times when he drinks more alcohol than now.  Epworth score  15/ 24  , Fatigue severity score N/a   , depression score n/a    Social History   Socioeconomic History  . Marital status: Single    Spouse name: Not on file  . Number of children: Not on file  . Years of education: Not on file  . Highest education level: Not on file  Social Needs  . Financial resource strain: Not on file  . Food insecurity - worry: Not on file  .  Food insecurity - inability: Not on file  . Transportation needs - medical: Not on file  . Transportation needs - non-medical: Not on file  Occupational History  . Not on file  Tobacco Use  . Smoking status: Former Smoker    Last attempt to quit: 02/25/2011    Years since quitting: 6.5  . Smokeless tobacco: Never Used  Substance and Sexual Activity  . Alcohol use: Yes    Alcohol/week: 8.4 oz    Types: 14 Cans of beer per week  . Drug use: No  . Sexual activity: Yes    Comment: E-cigarette users  Other Topics Concern  . Not on file  Social History Narrative  . Not on file    Family History  Problem Relation Age of Onset  . Cancer Other        brain cancer  . Cancer Father        skin cancer  . Cancer Maternal Grandmother 20       brain cancer  . Alzheimer's disease Paternal Grandmother   . Heart disease Paternal Grandfather     Past Medical History:  Diagnosis Date  . Brain cancer ([place]) 02/25/2011   grade III anaplastic ologodendrglioma  . Hypertension   . PTSD (post-traumatic stress disorder)   . Seizures ([place])     Past Surgical History:  Procedure Laterality Date  . BASAL CELL CARCINOMA EXCISION  1995  . CRANIOTOMY FOR TUMOR Right 02/25/2011    Current Outpatient Medications  Medication Sig Dispense Refill  . amLODipine-benazepril (LOTREL) 5-20 MG per capsule Take 1 capsule by  mouth 2 (two) times daily. (Patient taking differently: Take 1 capsule by mouth daily. ) 60 capsule 6  . busPIRone (BUSPAR) 30 MG tablet Take 30 mg by mouth 2 (two) times daily.    Marland Kitchen doxazosin (CARDURA) 4 MG tablet Take 4 mg by mouth daily.    Marland Kitchen EPINEPHrine 0.3 mg/0.3 mL IJ SOAJ injection Inject 0.3 mg into the muscle once. As needed for severe allergic reaction    . Oxcarbazepine (TRILEPTAL) 300 MG tablet Take 1 tablet (300 mg total) by mouth 2 (two) times daily. 60 tablet 0  . vortioxetine HBr (TRINTELLIX) 20 MG TABS Take 20 mg by mouth daily.     No current facility-administered medications for this visit.     Allergies as of 09/27/2017 - Review Complete 08/17/2016  Allergen Reaction Noted  . Contrast media [iodinated diagnostic agents] Rash 06/21/2013  . Penicillins Shortness Of Breath 06/21/2013  . Shellfish allergy Rash 06/21/2013    Vitals: BP 128/87   Pulse 65   Ht 6\' 1"  (1.854 m)   Wt 203 lb (92.1 kg)   BMI 26.78 kg/m  Last Weight:  Wt Readings from Last 1 Encounters:  09/27/17 203 lb (92.1 kg)   NID:POEU mass index is 26.78 kg/m.     Last Height:   Ht Readings from Last 1 Encounters:  09/27/17 6\' 1"  (1.854 m)    Physical exam:  General: The patient is awake, alert and appears not in acute distress. The patient is well groomed. Head: Normocephalic, atraumatic. Neck is supple. Mallampati 4,  neck circumference:16. Nasal airflow patent   Cardiovascular:  Regular rate and rhythm , without  murmurs or carotid bruit, and without distended neck veins. Respiratory: Lungs are clear to auscultation. Skin:  Without evidence of edema, or rash Trunk: BMI is normal . The patient's posture is erect   Neurologic exam : The patient is awake and alert, oriented  to place and time.    Attention span & concentration ability appears normal.  Speech is fluent,  without  dysarthria, dysphonia or aphasia.  Mood and affect are appropriate.  Cranial nerves:  The patient reported no  change in taste or smell. Pupils are equal and briskly reactive to light. Funduscopic exam without evidence of pallor or edema. Extraocular movements  in vertical and horizontal planes intact and without nystagmus. Visual fields by finger perimetry are intact. Hearing to finger rub intact. Facial sensation intact to fine touch. Facial motor strength is symmetric and tongue and uvula move midline. Shoulder shrug was symmetrical.  Motor exam:  Normal tone, muscle bulk and symmetric strength in all extremities. Sensory:  Fine touch, pinprick and vibration were normal - Proprioception tested in the upper extremities was normal. Coordination: Rapid alternating movements in the fingers/hands was normal. Finger-to-nose maneuver  normal without evidence of ataxia, dysmetria or tremor. He reports no changes in handwriting. Gait and station: Patient walks without assistive device and is able unassisted to climb up to the exam table. Strength within normal limits. Stance is stable and normal. Toe and heel stand were tested and normal .Tandem gait is unfragmented.  He is very steady and turns with 3 steps , . Romberg testing is negative. Deep tendon reflexes: in the upper and lower extremities are symmetric and intact. Babinski maneuver response is  downgoing.   Assessment:  After physical and neurologic examination, review of laboratory studies,  Personal review of results / notes / testing and pre-existing records as far as provided in visit., my assessment is   1) the patient's diagnosis and history of PTSD can significantly contribute to his current sleep experience.  The origin relies in his childhood is unrelated to his neuro-oncology diagnosis.  He had nightmares ever since he was a child, is frequently resumed position to sleep that was either just outside the bed or under or between covers.   2) However, it is important that after partial brain tumor resection,  chemotherapy and radiation he noted sleep  walking- this seems to have changed sleep pattern and the frequency sleepwalking attacks.  He is amnestic for these events, but he is not incontinent, he has not been seen convulsing or being tonic when sleepwalking.    3) PTSD/ nightmares - He has been told that he is rigid when his wife tries to wake him up from 1 of his frequent nightmares, associated with yelling.  4) Alcohol may reduce the threshold to parasomnia and seizures, and he should refrain.   5) Hyponatremia ( see labs )  may induce confusion, seizures and cognitive problems, this is a frequent side effect of Carbamazepine and oxcarbazepine.   The patient was advised of the nature of the diagnosed disorder , the treatment options and the  risks for general health and wellness arising from not treating the condition.   I spent more than 45 minutes of face to face time with the patient.  Greater than 50% of time was spent in counseling and coordination of care. We have discussed the diagnosis and differential and I answered the patient's questions.    Plan:  Treatment plan and additional workup :  I want to perform a attended sleep study mimicking the patient's usual sleep time.  He would be a late arrival and I would like to allow him to sleep until 6 AM instead of waking him at 5 AM.  It seems that the later hours of sleep all the  ones with more dream pressure.  He believes his sleep sleep walks mostly in the middle of his sleep  between midnight and 7 AM - this may be around 3 or 4 AM.  I will ask to have Beau as the technologist attending the sleep study, and will ask him to set the EEG montage as a double banana, of referential bipolar montages. Video and audio need  to be focussed on sleep walking.     Shawn Seat, MD 0/04/2329, 07:62 AM  Certified in Neurology by ABPN Certified in [place] by [facility] [address] [place], [place] [address]

## 2017-10-09 DIAGNOSIS — F4310 Post-traumatic stress disorder, unspecified: Secondary | ICD-10-CM | POA: Diagnosis not present

## 2017-10-17 DIAGNOSIS — F4310 Post-traumatic stress disorder, unspecified: Secondary | ICD-10-CM | POA: Diagnosis not present

## 2017-10-23 DIAGNOSIS — F4310 Post-traumatic stress disorder, unspecified: Secondary | ICD-10-CM | POA: Diagnosis not present

## 2017-10-31 ENCOUNTER — Ambulatory Visit (INDEPENDENT_AMBULATORY_CARE_PROVIDER_SITE_OTHER): Payer: Managed Care, Other (non HMO) | Admitting: Neurology

## 2017-10-31 DIAGNOSIS — G4750 Parasomnia, unspecified: Secondary | ICD-10-CM | POA: Diagnosis not present

## 2017-10-31 DIAGNOSIS — F515 Nightmare disorder: Secondary | ICD-10-CM

## 2017-10-31 DIAGNOSIS — F4310 Post-traumatic stress disorder, unspecified: Secondary | ICD-10-CM

## 2017-10-31 DIAGNOSIS — C719 Malignant neoplasm of brain, unspecified: Secondary | ICD-10-CM

## 2017-10-31 DIAGNOSIS — G4751 Confusional arousals: Secondary | ICD-10-CM

## 2017-11-01 DIAGNOSIS — F4310 Post-traumatic stress disorder, unspecified: Secondary | ICD-10-CM | POA: Diagnosis not present

## 2017-11-01 NOTE — Procedures (Signed)
PATIENT'S NAME:  Shawn York, Shawn York DOB:      11-Oct-1975      MR#:    [id number]     DATE OF RECORDING: 11/01/2017 REFERRING M.D.:  [facility] via Dr. Nehemiah Settle, and PCP: Marin Comment, M.D. PSG with parasomnia montage / expanded EEG Polysomnogram HISTORY:  42 y.o. male, seen here for Parasomnia montage PSG to address a pattern of sleep activities that have arisen at the same time that his brain tumor was detected and treated surgically as well as with radiation and chemotherapy. He underwent a partial resection of his right temporal anaplastic oligodendroglia at [facility] in 2012.  He developed sleep walking soon after- he was sometimes gently guided back to bed by his wife who would find him in any place of the house after he left the bed to go to the bathroom.  He stated that he has not been disoriented to his homes layout, he knows where the bathroom is and he has not gotten lost looking for it, he just doesn't come back to the bedroom, but always stays in his house -He is amnestic for these events. The spells have not become more frequent, he believes. His wife has witnessed hyperventilation as well as breath-holding spells that seem to be related to the dream underlying his sleep at that time.  He believes that he may hold his breath as not to be detected by a perceived intruder (nightmare), as well as hyperventilating when he is in his dream physically active or anxious. He talks in his sleep, has kicked and boxed, but never injured himself or another person (wife). He has thrown his sheets of the bed, has jumped out of bed, never fell out of bed, and never broke objects on his nightstand. He craved a more sound and restful sleep. Doxazosin has changed his frequent nightmares, attributed to PTSD since childhood.  The patient endorsed the Epworth Sleepiness Scale at 15 points.   The patient's weight 203 pounds with a height of 73 (inches), resulting in a BMI of  26.78 kg/m2. The patient's neck circumference measured 17 inches.  CURRENT MEDICATIONS: Lotrel, Buspar, Cardura, Epi pen, Trileptal, Trintellix   PROCEDURE:  This is a multichannel digital polysomnogram utilizing the Somnostar 11.2 system.  Electrodes and sensors were applied and monitored per AASM Specifications.   EEG, EOG, Chin and Limb EMG, were sampled at 200 Hz.  ECG, Snore and Nasal Pressure, Thermal Airflow, Respiratory Effort, CPAP Flow and Pressure, Oximetry was sampled at 50 Hz. Digital video and audio were recorded.      BASELINE STUDY Lights Out was at 00:16 and Lights On at 06:45.  Total recording time (TRT) was 389.5 minutes, with a total sleep time (TST) of 359 minutes.   The patient's sleep latency was 18.5 minutes.  REM latency was 113 minutes.  High sleep efficiency at 92.2 %.     SLEEP ARCHITECTURE: WASO (Wake after sleep onset) was 10.5 minutes.  There were 11 minutes in Stage N1, 205.5 minutes Stage N2, 70.5 minutes Stage N3 and 72 minutes in Stage REM.  The percentage of Stage N1 was 3.1%, Stage N2 was 57.2%, Stage N3 was 19.6% and Stage R (REM sleep) was 20.1%.  RESPIRATORY ANALYSIS:  There were a total of 13 respiratory events:  9 obstructive apneas, 4 hypopneas with 0 respiratory event related arousals (RERAs).      The total APNEA/HYPOPNEA INDEX (AHI) was 2.2/hour and the total RESPIRATORY DISTURBANCE INDEX was  2.2 /hour.  10 events occurred in REM sleep and 4 events in NREM. The REM AHI was 8.3 /hour, versus a non-REM AHI of 0.6. The patient spent 85 minutes of total sleep time in the supine position and 274 minutes in non-supine. The supine AHI was 0.0 versus a non-supine AHI of 2.9.  OXYGEN SATURATION & C02:  The Wake baseline 02 saturation was 98%, with the lowest being 91%. Time spent below 89% saturation equaled 0 minutes.   PERIODIC LIMB MOVEMENTS:  The patient had a total of 0 Periodic Limb Movements.  The arousals were noted as: 70 were spontaneous, 0 were  associated with PLMs, and 11 were associated with respiratory events. Audio and video analysis did not show any abnormal or unusual movements, behaviors, phonations or vocalizations. No nocturia, mild Snoring was noted. EKG was in keeping with normal sinus rhythm (NSR). EEG without epileptiform discharges. High amount of N3 sleep with symmetric EEG tracing.  Post-study, the patient indicated that sleep was the same as usual.   IMPRESSION: 1. No evidence of clinically significant Obstructive Sleep Apnea (OSA) 2. No REM sleep disorder identified, neither Periodic Limb Movement Disorder (PLMD) 3. Mild Snoring 4. We did not capture Confusional Arousals, Sleepwalking, Sleep Terrors, or other Parasomnias.   RECOMMENDATIONS: Treat as PTSD/ Confusional arousals- with Prazosin or Doxazosin. Watch for hyponatremia related confusion/ disorientation. Refrain from all alcohol use.   1. A follow up appointment can be scheduled with his [facility] neurological physician and / or the Sleep Clinic at [facility] - if the referring physician and patient desire.  2. The referring provider ( [facility] Dr. Ferd Glassing / Local: A. Orland Mustard, MD) will be notified of the results.     I certify that I have reviewed the entire raw data recording prior to the issuance of this report in accordance with the Standards of Accreditation of the American Academy of Sleep Medicine (AASM)   Larey Seat, MD 11-01-2017  Diplomat, American Board of Psychiatry and Neurology  Diplomat, American Board of [place] Director, [employer] [facility] [employer]

## 2017-11-02 ENCOUNTER — Telehealth: Payer: Self-pay | Admitting: Neurology

## 2017-11-02 NOTE — Telephone Encounter (Signed)
-----   Message from Larey Seat, MD sent at 11/01/2017  5:20 PM EST ----- No parasomnia or seizure activity recorded. Follows up with [facility] Dr Ferd Glassing ( ?) . Cc Dr. London Pepper. / Nehemiah Settle , MD

## 2017-11-02 NOTE — Telephone Encounter (Signed)
Called the patient and went over the sleep study results. I informed him that there were no significant findings noted on the the study. I have sent a copy of the sleep study to all the doctors he sees and the patient is aware and agreed to this. Pt verbalized understanding of the results. I informed him of Dr Dohmeier's recommendations and informed the patient that he can follow up with his MD's. I sent a copy of the study to the patient also for his records. Pt verbalized understanding and had no questions.

## 2017-11-06 DIAGNOSIS — F4310 Post-traumatic stress disorder, unspecified: Secondary | ICD-10-CM | POA: Diagnosis not present

## 2017-11-14 DIAGNOSIS — F4310 Post-traumatic stress disorder, unspecified: Secondary | ICD-10-CM | POA: Diagnosis not present

## 2017-11-21 DIAGNOSIS — F4310 Post-traumatic stress disorder, unspecified: Secondary | ICD-10-CM | POA: Diagnosis not present

## 2017-12-06 DIAGNOSIS — F4310 Post-traumatic stress disorder, unspecified: Secondary | ICD-10-CM | POA: Diagnosis not present

## 2017-12-13 DIAGNOSIS — F4310 Post-traumatic stress disorder, unspecified: Secondary | ICD-10-CM | POA: Diagnosis not present

## 2017-12-28 DIAGNOSIS — F4310 Post-traumatic stress disorder, unspecified: Secondary | ICD-10-CM | POA: Diagnosis not present

## 2018-01-10 DIAGNOSIS — C712 Malignant neoplasm of temporal lobe: Secondary | ICD-10-CM | POA: Diagnosis not present

## 2018-01-18 DIAGNOSIS — F4310 Post-traumatic stress disorder, unspecified: Secondary | ICD-10-CM | POA: Diagnosis not present

## 2018-03-06 DIAGNOSIS — F4310 Post-traumatic stress disorder, unspecified: Secondary | ICD-10-CM | POA: Diagnosis not present

## 2018-03-16 DIAGNOSIS — F4310 Post-traumatic stress disorder, unspecified: Secondary | ICD-10-CM | POA: Diagnosis not present

## 2018-04-03 DIAGNOSIS — F4310 Post-traumatic stress disorder, unspecified: Secondary | ICD-10-CM | POA: Diagnosis not present

## 2018-04-13 DIAGNOSIS — F4310 Post-traumatic stress disorder, unspecified: Secondary | ICD-10-CM | POA: Diagnosis not present

## 2018-04-18 DIAGNOSIS — F4310 Post-traumatic stress disorder, unspecified: Secondary | ICD-10-CM | POA: Diagnosis not present

## 2018-05-08 DIAGNOSIS — F4310 Post-traumatic stress disorder, unspecified: Secondary | ICD-10-CM | POA: Diagnosis not present

## 2018-05-15 DIAGNOSIS — F4310 Post-traumatic stress disorder, unspecified: Secondary | ICD-10-CM | POA: Diagnosis not present

## 2018-05-22 DIAGNOSIS — F4310 Post-traumatic stress disorder, unspecified: Secondary | ICD-10-CM | POA: Diagnosis not present

## 2018-05-31 DIAGNOSIS — M545 Low back pain: Secondary | ICD-10-CM | POA: Diagnosis not present

## 2018-06-05 ENCOUNTER — Ambulatory Visit (INDEPENDENT_AMBULATORY_CARE_PROVIDER_SITE_OTHER): Payer: Managed Care, Other (non HMO) | Admitting: Psychiatry

## 2018-06-05 DIAGNOSIS — F4310 Post-traumatic stress disorder, unspecified: Secondary | ICD-10-CM | POA: Insufficient documentation

## 2018-06-05 NOTE — Progress Notes (Signed)
      [facility] Counselor/Therapist Progress Note   Patient ID: Shawn York, MRN: [id number]  Date: 06/05/2018  Timespent: 50 minutes  Treatment Type: Individual  Subjective: Patient was present for session.  Patient reported he had gotten a new position and was excited about being able to move forward with his work.  Patient was able to discuss how triggered he has been at work and how they were way too many similarities to his childhood.  Discussed the importance of being in a healthy environment for him and that he is making a positive move.  Patient developed new treatment plan in session.  Patient went on to discuss ways to get through the next few weeks and how to maintain a positive perspective.  Patient acknowledged the fact that he is got to remind himself regularly he is enough and to work on talking himself through moments where he feels very triggered.  Patient was able to report he has been able to do that at times recently.  He was able to recognize the importance of not taking things personal and different CBT filters to help him do that were discussed with patient.  Patient agreed to continue working on his coping skills/CBT filters over the next few weeks  Interventions:CBT, Solution Focused and Supportive  Mental Status Exam:   Appearance:   Casual     Behavior:  appropriate  Motor:  Normal  Speech/Language:   Normal Rate  Affect:  Congruent  Mood:  normal  Thought process:  Coherent  Thought content:    Logical  Perceptual disturbances:    Normal  Orientation:  Full (Time, Place, and Person)  Attention:  Good  Concentration:  good  Memory:  Immediate  Fund of knowledge:   Good  Insight:    Good  Judgment:   Good  Impulse Control:  good    Reported Symptoms: anxiety,depression, sleep issues, back pain -working with physician  Risk Assessment: Danger to Self:  No Self-injurious Behavior: No Danger to Others: No Duty to Warn:no Physical Aggression /  Violence:No  Access to Firearms a concern: No  Gang Involvement:No   Diagnosis:   ICD-10-CM   1. PTSD (post-traumatic stress disorder) F43.10      Plan: 1.  Patient to continue to engage in individual counseling 2-4 times a month or as needed. 2.  Patient to identify and apply CBT, coping skills learned in session to decrease depression and anxiety symptoms. 3.  Patient to contact this office, go to the local ED or call 911 if a crisis or emergency develops between visits.  Lina Sayre, [place]

## 2018-06-21 ENCOUNTER — Ambulatory Visit: Payer: Managed Care, Other (non HMO) | Admitting: Psychiatry

## 2018-07-06 DIAGNOSIS — I10 Essential (primary) hypertension: Secondary | ICD-10-CM | POA: Diagnosis not present

## 2018-07-06 DIAGNOSIS — G40909 Epilepsy, unspecified, not intractable, without status epilepticus: Secondary | ICD-10-CM | POA: Diagnosis not present

## 2018-07-06 DIAGNOSIS — F329 Major depressive disorder, single episode, unspecified: Secondary | ICD-10-CM | POA: Diagnosis not present

## 2018-07-10 ENCOUNTER — Ambulatory Visit: Payer: Managed Care, Other (non HMO) | Admitting: Psychiatry

## 2018-07-19 ENCOUNTER — Encounter: Payer: Self-pay | Admitting: Psychiatry

## 2018-07-19 ENCOUNTER — Ambulatory Visit (INDEPENDENT_AMBULATORY_CARE_PROVIDER_SITE_OTHER): Payer: BLUE CROSS/BLUE SHIELD | Admitting: Psychiatry

## 2018-07-19 DIAGNOSIS — F4310 Post-traumatic stress disorder, unspecified: Secondary | ICD-10-CM | POA: Diagnosis not present

## 2018-07-19 NOTE — Progress Notes (Signed)
      [facility] Counselor/Therapist Progress Note   Patient ID: Graylon Amory, MRN: [id number]  Date: 07/19/2018  Timespent: 46 minutes   Treatment Type: Individual   Reported Symptoms: Sleep disturbance, Fatigue and anxiety, mood issues   Mental Status Exam:    Appearance:   Well Groomed     Behavior:  Appropriate  Motor:  Normal  Speech/Language:   Normal Rate  Affect:  Congruent  Mood:  agitated  Thought process:  normal  Thought content:    WNL  Sensory/Perceptual disturbances:    WNL  Orientation:  oriented to person, place and time/date  Attention:  Good  Concentration:  Good  Memory:  Immediate;   [place] of knowledge:   Good  Insight:    Good  Judgment:   Good  Impulse Control:  Good     Risk Assessment: Danger to Self:  No Self-injurious Behavior: No Danger to Others: No Duty to Warn:no Physical Aggression / Violence:No  Access to Firearms a concern: No  Gang Involvement:No    Subjective: Patient was present for session.  Patient explained he was not doing well because he had been out of 2 of his medication for a week.  He explained that he has changed insurance companies and they are waiting for a prior authorization for the new insurance company.  Patient shared that his anxiety has increased greatly and he is ready to be back on his meds appropriately.  Patient also had to bring his 24-month old daughter to session.  She was struggling due to teething and was very fussy in the session.  Patient explained he is not getting as triggered since he has changed jobs.  He had one incident where he was talked to about something very small and he was able to maintain perspective.  He did have a disagreement with his brother which was still bothering him but he feels he handled the situation appropriately.  Patient went on to explain that he is having the issues with sleeping again.  He was encouraged to stay cognitive prior to going to sleep and to remind himself  of the things he is thankful for or read something that does not trigger any memories for him.  Patient was encouraged to continue utilizing coping skills from previous sessions that have been helpful.  It was agreed that more trauma work will be addressed at next session if the sleep is still impacted by it.   Interventions: Solution-Oriented/Positive Psychology   Diagnosis:   ICD-10-CM   1. PTSD (post-traumatic stress disorder) F43.10      Plan: 1.  Patient to continue to engage in individual counseling 2-4 times a month or as needed. 2.  Patient to identify and apply CBT, coping skills learned in session to decrease triggered responses and anxiety symptoms. 3.  Patient to contact this office, go to the local ED or call 911 if a crisis or emergency develops between visits.   Lina Sayre, [place]

## 2018-07-25 ENCOUNTER — Telehealth: Payer: Self-pay

## 2018-07-25 NOTE — Telephone Encounter (Signed)
Prior Authorization approved for Rexulti 1mg  through 07/18/2021 and Trintellix 20mg  through 07/18/2021

## 2018-08-02 ENCOUNTER — Ambulatory Visit (INDEPENDENT_AMBULATORY_CARE_PROVIDER_SITE_OTHER): Payer: BLUE CROSS/BLUE SHIELD | Admitting: Psychiatry

## 2018-08-02 DIAGNOSIS — F4310 Post-traumatic stress disorder, unspecified: Secondary | ICD-10-CM | POA: Diagnosis not present
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
## 2018-08-30 ENCOUNTER — Encounter: Payer: Self-pay | Admitting: Psychiatry

## 2018-08-30 ENCOUNTER — Ambulatory Visit (INDEPENDENT_AMBULATORY_CARE_PROVIDER_SITE_OTHER): Payer: BLUE CROSS/BLUE SHIELD | Admitting: Psychiatry

## 2018-08-30 VITALS — BP 135/103 | HR 71

## 2018-08-30 DIAGNOSIS — F330 Major depressive disorder, recurrent, mild: Secondary | ICD-10-CM

## 2018-08-30 DIAGNOSIS — F4310 Post-traumatic stress disorder, unspecified: Secondary | ICD-10-CM

## 2018-08-30 NOTE — Progress Notes (Signed)
Shawn York [id number] 1976-02-18 43 y.o.  Subjective:   Patient ID:  Shawn York is a 43 y.o. (DOB 1976-07-14) male.  Chief Complaint:  Chief Complaint  Patient presents with  . Anxiety  . Depression  . Sleeping Problem    HPI Shawn York presents to the office today for follow-up of anxiety, depression, and insomnia. He reports changing jobs in October and that this this has been "great." He reports that he is working evenings during the week and early on the weekends, which has allowed him time to do [employer] and more time with his family. He reports that new work environment is more healthy and supportive. He reports that he continues to experience constant worry and feeling nervous. "I still ruminate more than I want" and reports some intrusive memories. Reports some continued hyper-vigilance. He reports nightmares may be slightly less frequent and continue to happen about twice a week. Reports that he will have a dream where he is having to search for something or take care of something and does not feel rested upon awakening. Some difficulty staying asleep. He reports that his mood has improved which he attributes to improved situational stressors and therapy. Reports some continued depression. Reports irritability has improved. Reports energy and motivation "may be a little better, but not where I want it to be." Reports some mild concentration impairment with daily tasks and is easily distracted. Able to concentrate with effort when he studies. Denies SI.   Reports that children have recently had URI s/s.   Review of Systems:  Review of Systems  Musculoskeletal: Negative for gait problem.  Neurological: Negative for tremors.  Psychiatric/Behavioral:       Please refer to HPI    Medications: I have reviewed the patient's current medications.  Current Outpatient Medications  Medication Sig Dispense Refill  . benazepril (LOTENSIN) 20 MG tablet Take 20 mg by mouth daily.     . Brexpiprazole (REXULTI) 1 MG TABS Take by mouth.    . busPIRone (BUSPAR) 30 MG tablet Take 30 mg by mouth 2 (two) times daily.    Marland Kitchen doxazosin (CARDURA) 4 MG tablet Take 4 mg by mouth daily.    . Oxcarbazepine (TRILEPTAL) 300 MG tablet Take 1 tablet (300 mg total) by mouth 2 (two) times daily. 60 tablet 0  . vortioxetine HBr (TRINTELLIX) 20 MG TABS Take 20 mg by mouth daily.    Marland Kitchen EPINEPHrine 0.3 mg/0.3 mL IJ SOAJ injection Inject 0.3 mg into the muscle once. As needed for severe allergic reaction     No current facility-administered medications for this visit.     Medication Side Effects: Nausea  Allergies:  Allergies  Allergen Reactions  . Contrast Media [Iodinated Diagnostic Agents] Rash  . Penicillins Shortness Of Breath    Has patient had a PCN reaction causing immediate rash, facial/tongue/throat swelling, SOB or lightheadedness with hypotension: Yes Has patient had a PCN reaction causing severe rash involving mucus membranes or skin necrosis: No Has patient had a PCN reaction that required hospitalization No Has patient had a PCN reaction occurring within the last 10 years: No If all of the above answers are "NO", then may proceed with Cephalosporin use.   Marland Kitchen Shellfish Allergy Rash    Past Medical History:  Diagnosis Date  . 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 Hypertension   . PTSD (post-traumatic stress disorder)   . Seizures ([place])     Family History  Problem Relation Age  of Onset  . Cancer Other        brain cancer  . Cancer Father        skin cancer  . Cancer Maternal Grandmother 28       brain cancer  . Alcohol abuse Brother   . Alzheimer's disease Paternal Grandmother   . Dementia Paternal Grandmother   . Heart disease Paternal Grandfather     Social History   Socioeconomic History  . Marital status: Single    Spouse name: Not on file  . Number of children: Not on file  . Years of education: Not on file  .  Highest education level: Not on file  Occupational History  . Not on file  Social Needs  . Financial resource strain: Not on file  . Food insecurity:    Worry: Not on file    Inability: Not on file  . Transportation needs:    Medical: Not on file    Non-medical: Not on file  Tobacco Use  . Smoking status: Former Smoker    Last attempt to quit: 02/25/2011    Years since quitting: 7.5  . Smokeless tobacco: Never Used  Substance and Sexual Activity  . Alcohol use: Yes    Alcohol/week: 14.0 standard drinks    Types: 14 Cans of beer per week  . Drug use: No  . Sexual activity: Yes    Comment: E-cigarette users  Lifestyle  . Physical activity:    Days per week: Not on file    Minutes per session: Not on file  . Stress: Not on file  Relationships  . Social connections:    Talks on phone: Not on file    Gets together: Not on file    Attends religious service: Not on file    Active member of club or organization: Not on file    Attends meetings of clubs or organizations: Not on file    Relationship status: Not on file  . Intimate partner violence:    Fear of current or ex partner: Not on file    Emotionally abused: Not on file    Physically abused: Not on file    Forced sexual activity: Not on file  Other Topics Concern  . Not on file  Social History Narrative  . Not on file    Past Medical History, Surgical history, Social history, and Family history were reviewed and updated as appropriate.   Please see review of systems for further details on the patient's review from today.   Objective:   Physical Exam:  BP (!) 135/103   Pulse 71   Physical Exam Constitutional:      General: He is not in acute distress.    Appearance: He is well-developed.  Musculoskeletal:        General: No deformity.  Neurological:     Mental Status: He is alert and oriented to person, place, and time.     Coordination: Coordination normal.  Psychiatric:        Mood and Affect: Mood is  anxious and depressed. Affect is not labile, blunt, angry or inappropriate.        Speech: Speech normal.        Behavior: Behavior normal.        Thought Content: Thought content normal. Thought content does not include homicidal or suicidal ideation. Thought content does not include homicidal or suicidal plan.        Judgment: Judgment normal.     Comments: Insight  intact. No auditory or visual hallucinations. No delusions.      Lab Review:     Component Value Date/Time   NA 138 08/19/2016 0610   NA 141 06/21/2013 1505   K 3.5 08/19/2016 0610   K 4.2 06/21/2013 1505   CL 102 08/19/2016 0610   CO2 28 08/19/2016 0610   CO2 25 06/21/2013 1505   GLUCOSE 88 08/19/2016 0610   GLUCOSE 83 06/21/2013 1505   BUN 21 (H) 08/19/2016 0610   BUN 18.1 06/21/2013 1505   CREATININE 0.88 08/19/2016 0610   CREATININE 0.9 06/21/2013 1505   CALCIUM 9.1 08/19/2016 0610   CALCIUM 9.1 06/21/2013 1505   PROT 7.0 06/21/2013 1505   ALBUMIN 4.1 06/21/2013 1505   AST 15 06/21/2013 1505   ALT 13 06/21/2013 1505   ALKPHOS 59 06/21/2013 1505   BILITOT 0.33 06/21/2013 1505   GFRNONAA >60 08/19/2016 0610   GFRAA >60 08/19/2016 0610       Component Value Date/Time   WBC 6.5 08/18/2016 1819   RBC 4.20 (L) 08/18/2016 1819   HGB 13.9 08/18/2016 1819   HGB 13.3 06/21/2013 1505   HCT 39.5 08/18/2016 1819   HCT 38.7 06/21/2013 1505   PLT 156 08/18/2016 1819   PLT 154 06/21/2013 1505   MCV 94.0 08/18/2016 1819   MCV 99.0 (H) 06/21/2013 1505   MCH 33.1 08/18/2016 1819   MCHC 35.2 08/18/2016 1819   RDW 13.1 08/18/2016 1819   RDW 13.3 06/21/2013 1505   LYMPHSABS 1.3 08/18/2016 1819   LYMPHSABS 1.1 06/21/2013 1505   MONOABS 0.5 08/18/2016 1819   MONOABS 0.5 06/21/2013 1505   EOSABS 0.1 08/18/2016 1819   EOSABS 0.1 06/21/2013 1505   BASOSABS 0.0 08/18/2016 1819   BASOSABS 0.0 06/21/2013 1505    No results found for: POCLITH, LITHIUM   No results found for: PHENYTOIN, PHENOBARB, VALPROATE, CBMZ    .res Assessment: Plan:   Discussed option of increasing Rexulti to 2 mg po qd to possibly further improve mood and anxiety. Pt reports that he would prefer to see how the next 6 weeks go and establish what his "new normal" is after job change, holidays, and children being sick.   Will continue Rexulti 1 mg po qd for mood and anxiety.  Continue Trintellix 20 mg po qd for depression and anxiety. Continue Buspar 30 mg po BID for anxiety. Continue Doxazosin for nightmares.   PTSD (post-traumatic stress disorder) - Chronic with some improvement  Mild episode of recurrent major depressive disorder ([place]) - Chronic with some improvement.   Please see After Visit Summary for patient specific instructions.  Future Appointments  Date Time Provider [place]  09/11/2018 10:00 AM Lina Sayre, [facility] None  10/11/2018 10:00 AM Thayer Headings, PMHNP CP[facility] None    No orders of the defined types were placed in this encounter.     -------------------------------

## 2018-09-11 ENCOUNTER — Ambulatory Visit (INDEPENDENT_AMBULATORY_CARE_PROVIDER_SITE_OTHER): Payer: BLUE CROSS/BLUE SHIELD | Admitting: Psychiatry

## 2018-09-11 ENCOUNTER — Encounter: Payer: Self-pay | Admitting: Psychiatry

## 2018-09-11 DIAGNOSIS — F4310 Post-traumatic stress disorder, unspecified: Secondary | ICD-10-CM | POA: Diagnosis not present

## 2018-09-11 DIAGNOSIS — F330 Major depressive disorder, recurrent, mild: Secondary | ICD-10-CM | POA: Diagnosis not present

## 2018-09-11 NOTE — Progress Notes (Signed)
      [facility] Counselor/Therapist Progress Note  Patient ID: Daxen Lanum, MRN: [id number],    Date: 09/11/2018  Time Spent: 52 minutes   Treatment Type: Individual Therapy  Reported Symptoms: Sleep disturbance,triggered resposes, anxiety, sadness  Mental Status Exam:  Appearance:   Casual     Behavior:  Sharing  Motor:  Normal  Speech/Language:   Normal Rate  Affect:  Congruent  Mood:  normal  Thought process:  normal  Thought content:    WNL  Sensory/Perceptual disturbances:    WNL  Orientation:  oriented to person, place and time/date  Attention:  Good  Concentration:  Good  Memory:  WNL  [place] of knowledge:   Good  Insight:    Good  Judgment:   Good  Impulse Control:  Good   Risk Assessment: Danger to Self:  No Self-injurious Behavior: No Danger to Others: No Duty to Warn:no Physical Aggression / Violence:No  Access to Firearms a concern: No  Gang Involvement:No   Subjective: Patient was present for session.  Patient reported he had a very traumatic event over the past week.  He shared that his newborn had RSV and started having breathing issues in the middle the night.  He went in her lips were starting to turn blue and she was foaming at the mouth.  He was able to get her breathing again and she is doing fine currently.  He reported it has taken him a while to come back down from that event.  Patient went on to explain that he is wondering about the way he and his wife interact at times.  Patient reported he still feels that he struggles with when to stand up for himself and when to be compliant.  Patient stated his frustration from childhood and constantly being told to be quiet and just deal with stuff makes it very difficult for him to know how to handle situations.  Processed through some of the concerns that patient was having with his relationship.  Discussed ways to communicate his concerns with his wife and how they can come up with a plan on how to deal with  the situation together.  Patient was encouraged to think about his son when he is being confronted on things and decide if he would allow somebody to talk to his son in that manner or not.  From that information he can decide how he wants to handle a situation.  Discussed simple ways to set limits without being disrespectful.  Patient reported feeling positive about plans developed in session.  He wants to continue addressing the issues at next session.  Interventions: Solution-Oriented/Positive Psychology  Diagnosis:   ICD-10-CM   1. PTSD (post-traumatic stress disorder) F43.10   2. MDD (major depressive disorder), recurrent episode, mild ([place]) F33.0     Plan: 1.  Patient to continue to engage in individual counseling 2-4 times a month or as needed. 2.  Patient to identify and apply CBT, coping skills learned in session to decrease triggered responses and anxiety symptoms. 3.  Patient to contact this office, go to the local ED or call 911 if a crisis or emergency develops between visits.  Lina Sayre, [place]

## 2018-09-28 DIAGNOSIS — H938X9 Other specified disorders of ear, unspecified ear: Secondary | ICD-10-CM | POA: Diagnosis not present

## 2018-09-28 DIAGNOSIS — R0981 Nasal congestion: Secondary | ICD-10-CM | POA: Diagnosis not present

## 2018-10-11 ENCOUNTER — Encounter: Payer: Self-pay | Admitting: Psychiatry

## 2018-10-11 ENCOUNTER — Ambulatory Visit (INDEPENDENT_AMBULATORY_CARE_PROVIDER_SITE_OTHER): Payer: BLUE CROSS/BLUE SHIELD | Admitting: Psychiatry

## 2018-10-11 VITALS — BP 136/100 | HR 58

## 2018-10-11 DIAGNOSIS — F4310 Post-traumatic stress disorder, unspecified: Secondary | ICD-10-CM

## 2018-10-11 DIAGNOSIS — F330 Major depressive disorder, recurrent, mild: Secondary | ICD-10-CM | POA: Diagnosis not present

## 2018-10-11 DIAGNOSIS — F5105 Insomnia due to other mental disorder: Secondary | ICD-10-CM

## 2018-10-11 DIAGNOSIS — F99 Mental disorder, not otherwise specified: Secondary | ICD-10-CM

## 2018-10-11 MED ORDER — DOXAZOSIN MESYLATE 4 MG PO TABS: 4.0000 mg | ORAL_TABLET | Freq: Every day | ORAL | 4 refills | Status: DC

## 2018-10-11 MED ORDER — BUSPIRONE HCL 30 MG PO TABS: 30.0000 mg | ORAL_TABLET | Freq: Two times a day (BID) | ORAL | 4 refills | Status: DC
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
## 2018-10-22 DIAGNOSIS — R197 Diarrhea, unspecified: Secondary | ICD-10-CM | POA: Diagnosis not present

## 2018-10-22 DIAGNOSIS — R112 Nausea with vomiting, unspecified: Secondary | ICD-10-CM | POA: Diagnosis not present

## 2018-10-22 DIAGNOSIS — R52 Pain, unspecified: Secondary | ICD-10-CM | POA: Diagnosis not present

## 2018-10-25 ENCOUNTER — Encounter: Payer: Self-pay | Admitting: Psychiatry

## 2018-10-25 ENCOUNTER — Ambulatory Visit (INDEPENDENT_AMBULATORY_CARE_PROVIDER_SITE_OTHER): Payer: BLUE CROSS/BLUE SHIELD | Admitting: Psychiatry

## 2018-10-25 DIAGNOSIS — F4310 Post-traumatic stress disorder, unspecified: Secondary | ICD-10-CM

## 2018-10-25 NOTE — Progress Notes (Signed)
      [facility] Counselor/Therapist Progress Note  Patient ID: Shawn York, MRN: [id number],    Date: 10/25/2018  Time Spent: 50 minutes  Treatment Type: Individual Therapy  Reported Symptoms: irritability, anxiety, triggered responses  Mental Status Exam:  Appearance:   Casual     Behavior:  Appropriate  Motor:  Normal  Speech/Language:   Normal Rate  Affect:  Congruent  Mood:  normal  Thought process:  normal  Thought content:    WNL  Sensory/Perceptual disturbances:    WNL  Orientation:  oriented to person, place and time/date  Attention:  Good  Concentration:  Good  Memory:  WNL  Fund of knowledge:   Good  Insight:    Good  Judgment:   Good  Impulse Control:  Good   Risk Assessment: Danger to Self:  No Self-injurious Behavior: No Danger to Others: No Duty to Warn:no Physical Aggression / Violence:No  Access to Firearms a concern: No  Gang Involvement:No   Subjective: Patient was present for session.  Patient reported that his wife and he have been having some issues.  Patient was able to talk through what has been going on.  As he was talking he realized that he gets triggered in response to her the way that his father might respond by shutting down.  Was encouraged to think about what he needs from his wife to be able to communicate rather than shutting down.  He was able to recognize some things that she could say and do that would help him to be able to talk more.  Ways to talk to her about that issue were addressed with patient.  The importance of reminding himself that she loves him and wants the relationship was discussed with patient as well.  Patient was able to acknowledge all that they had been through and that she has shown him she loves him.  Patient agreed to use his CBT skills when he recognizes himself getting triggered especially with his wife.  Patient reported he is also having some triggers at work and will use his skills there as  well  Interventions: Cognitive Behavioral Therapy and Solution-Oriented/Positive Psychology  Diagnosis:   ICD-10-CM   1. PTSD (post-traumatic stress disorder) F43.10     Plan: 1.  Patient to continue to engage in individual counseling 2-4 times a month or as needed. 2.  Patient to identify and apply CBT, coping skills learned in session to decrease triggered responses and anxiety symptoms. 3.  Patient to contact this office, go to the local ED or call 911 if a crisis or emergency develops between visits.  Lina Sayre, [place]    This record has been created using [employer].  Chart creation errors have been sought, but may not always have been located and corrected. Such creation errors do not reflect on the standard of medical care.

## 2018-11-08 ENCOUNTER — Ambulatory Visit (INDEPENDENT_AMBULATORY_CARE_PROVIDER_SITE_OTHER): Payer: BC Managed Care – PPO | Admitting: Psychiatry

## 2018-11-08 ENCOUNTER — Other Ambulatory Visit: Payer: Self-pay

## 2018-11-08 VITALS — BP 152/96 | HR 70

## 2018-11-08 DIAGNOSIS — F99 Mental disorder, not otherwise specified: Secondary | ICD-10-CM

## 2018-11-08 DIAGNOSIS — F4310 Post-traumatic stress disorder, unspecified: Secondary | ICD-10-CM | POA: Diagnosis not present

## 2018-11-08 DIAGNOSIS — F5105 Insomnia due to other mental disorder: Secondary | ICD-10-CM

## 2018-11-08 DIAGNOSIS — F330 Major depressive disorder, recurrent, mild: Secondary | ICD-10-CM | POA: Diagnosis not present

## 2018-11-08 NOTE — Progress Notes (Signed)
Shawn York [id number] Jan 02, 1976 43 y.o.  Subjective:   Patient ID:  Shawn York is a 43 y.o. (DOB Jan 24, 1976) male.  Chief Complaint:  Chief Complaint  Patient presents with  . Anxiety  . Depression    HPI Shawn York presents to the office today for follow-up of anxiety and depression. He reports that his wife recently shared with him that she thinks he has "seemed more on edge" since he stopped his medications. He reports that he has likely been detached from family in the last few years. Reports that he has not resumed medications other than an occasional Buspar. He reports that having children has triggered some memories "of things I did not know were there." He reports that he feels "pretty anxious" persistently. Reports that depression is "there, but it's manageable." Reports that mood will occasionally change abruptly following certain triggers and then mood is both depressed and anxious. Reports rumination and recurrent intrusive memories. He repots that sleep is "the same" and that it fluctuates. Reports sleep quantity ranges from 5-7 hours with average sleep quantity of 5.5 hours. No change in appetite. Reports that wife often encourages him to eat. Motivation is the same. Reports that he is physically active all day and "that takes less energy than putting on a smile and talking to people all day." Reports that interacting with the public at work has "taken a lot of energy."  Denies SI.   Has been remodeling their home. Working nights during the week. Works early on weekends so he can have time with family. Has been looking for a full-time job with more regular hours.   Past Psychiatric Medication Trials: Trintellix Rexulti Trileptal Buspar Remeron Zoloft- Sexual side effects Hydroxyzine- Drowsiness Doxazosin   Review of Systems:  Review of Systems  Musculoskeletal: Negative for gait problem.  Allergic/Immunologic: Positive for environmental allergies.  Neurological:  Negative for tremors.  Psychiatric/Behavioral:       Please refer to HPI    Medications: I have reviewed the patient's current medications.  Current Outpatient Medications  Medication Sig Dispense Refill  . benazepril (LOTENSIN) 20 MG tablet Take 20 mg by mouth daily.    . cetirizine-pseudoephedrine (ZYRTEC-D) 5-120 MG tablet Take 1 tablet by mouth 2 (two) times daily.    . Oxcarbazepine (TRILEPTAL) 300 MG tablet Take 1 tablet (300 mg total) by mouth 2 (two) times daily. 60 tablet 0  . busPIRone (BUSPAR) 30 MG tablet Take 1 tablet (30 mg total) by mouth 2 (two) times daily for 30 days. (Patient not taking: Reported on 11/08/2018) 60 tablet 4  . doxazosin (CARDURA) 4 MG tablet Take 1 tablet (4 mg total) by mouth daily for 30 days. (Patient not taking: Reported on 11/08/2018) 30 tablet 4  . EPINEPHrine 0.3 mg/0.3 mL IJ SOAJ injection Inject 0.3 mg into the muscle once. As needed for severe allergic reaction     No current facility-administered medications for this visit.     Medication Side Effects: None  Allergies:  Allergies  Allergen Reactions  . Contrast Media [Iodinated Diagnostic Agents] Rash  . Penicillins Shortness Of Breath    Has patient had a PCN reaction causing immediate rash, facial/tongue/throat swelling, SOB or lightheadedness with hypotension: Yes Has patient had a PCN reaction causing severe rash involving mucus membranes or skin necrosis: No Has patient had a PCN reaction that required hospitalization No Has patient had a PCN reaction occurring within the last 10 years: No If all of the above answers are "NO", then may  proceed with Cephalosporin use.   Marland Kitchen Shellfish Allergy Rash    Past Medical History:  Diagnosis Date  . 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 Hypertension   . PTSD (post-traumatic stress disorder)   . Seizures ([place])     Family History  Problem Relation Age of Onset  . Cancer Other        brain cancer  .  Cancer Father        skin cancer  . Cancer Maternal Grandmother 14       brain cancer  . Alcohol abuse Brother   . Alzheimer's disease Paternal Grandmother   . Dementia Paternal Grandmother   . Heart disease Paternal Grandfather     Social History   Socioeconomic History  . Marital status: Single    Spouse name: Not on file  . Number of children: Not on file  . Years of education: Not on file  . Highest education level: Not on file  Occupational History  . Not on file  Social Needs  . Financial resource strain: Not on file  . Food insecurity:    Worry: Not on file    Inability: Not on file  . Transportation needs:    Medical: Not on file    Non-medical: Not on file  Tobacco Use  . Smoking status: Former Smoker    Last attempt to quit: 02/25/2011    Years since quitting: 7.7  . Smokeless tobacco: Never Used  Substance and Sexual Activity  . Alcohol use: Yes    Alcohol/week: 14.0 standard drinks    Types: 14 Cans of beer per week  . Drug use: No  . Sexual activity: Yes    Comment: E-cigarette users  Lifestyle  . Physical activity:    Days per week: Not on file    Minutes per session: Not on file  . Stress: Not on file  Relationships  . Social connections:    Talks on phone: Not on file    Gets together: Not on file    Attends religious service: Not on file    Active member of club or organization: Not on file    Attends meetings of clubs or organizations: Not on file    Relationship status: Not on file  . Intimate partner violence:    Fear of current or ex partner: Not on file    Emotionally abused: Not on file    Physically abused: Not on file    Forced sexual activity: Not on file  Other Topics Concern  . Not on file  Social History Narrative  . Not on file    Past Medical History, Surgical history, Social history, and Family history were reviewed and updated as appropriate.   Please see review of systems for further details on the patient's review  from today.   Objective:   Physical Exam:  BP (!) 152/96   Pulse 70   Physical Exam Constitutional:      General: He is not in acute distress.    Appearance: He is well-developed.  Musculoskeletal:        General: No deformity.  Neurological:     Mental Status: He is alert and oriented to person, place, and time.     Coordination: Coordination normal.  Psychiatric:        Attention and Perception: Attention and perception normal. He does not perceive auditory or visual hallucinations.        Mood and Affect: Mood  normal. Mood is not anxious or depressed. Affect is not labile, blunt, angry or inappropriate.        Speech: Speech normal.        Behavior: Behavior normal.        Thought Content: Thought content normal. Thought content does not include homicidal or suicidal ideation. Thought content does not include homicidal or suicidal plan.        Cognition and Memory: Cognition and memory normal.        Judgment: Judgment normal.     Comments: Insight intact. No delusions.      Lab Review:     Component Value Date/Time   NA 138 08/19/2016 0610   NA 141 06/21/2013 1505   K 3.5 08/19/2016 0610   K 4.2 06/21/2013 1505   CL 102 08/19/2016 0610   CO2 28 08/19/2016 0610   CO2 25 06/21/2013 1505   GLUCOSE 88 08/19/2016 0610   GLUCOSE 83 06/21/2013 1505   BUN 21 (H) 08/19/2016 0610   BUN 18.1 06/21/2013 1505   CREATININE 0.88 08/19/2016 0610   CREATININE 0.9 06/21/2013 1505   CALCIUM 9.1 08/19/2016 0610   CALCIUM 9.1 06/21/2013 1505   PROT 7.0 06/21/2013 1505   ALBUMIN 4.1 06/21/2013 1505   AST 15 06/21/2013 1505   ALT 13 06/21/2013 1505   ALKPHOS 59 06/21/2013 1505   BILITOT 0.33 06/21/2013 1505   GFRNONAA >60 08/19/2016 0610   GFRAA >60 08/19/2016 0610       Component Value Date/Time   WBC 6.5 08/18/2016 1819   RBC 4.20 (L) 08/18/2016 1819   HGB 13.9 08/18/2016 1819   HGB 13.3 06/21/2013 1505   HCT 39.5 08/18/2016 1819   HCT 38.7 06/21/2013 1505   PLT 156  08/18/2016 1819   PLT 154 06/21/2013 1505   MCV 94.0 08/18/2016 1819   MCV 99.0 (H) 06/21/2013 1505   MCH 33.1 08/18/2016 1819   MCHC 35.2 08/18/2016 1819   RDW 13.1 08/18/2016 1819   RDW 13.3 06/21/2013 1505   LYMPHSABS 1.3 08/18/2016 1819   LYMPHSABS 1.1 06/21/2013 1505   MONOABS 0.5 08/18/2016 1819   MONOABS 0.5 06/21/2013 1505   EOSABS 0.1 08/18/2016 1819   EOSABS 0.1 06/21/2013 1505   BASOSABS 0.0 08/18/2016 1819   BASOSABS 0.0 06/21/2013 1505    No results found for: POCLITH, LITHIUM   No results found for: PHENYTOIN, PHENOBARB, VALPROATE, CBMZ   .res Assessment: Plan:   Discussed treatment options with patient and he reports that he would like to continue to take as little medication as possible.  Discussed that BuSpar likely is not effective taking only sporadically and that this increases likelihood of side effects and that it needs to be taken twice daily for improvement.  Patient agrees to taking BuSpar twice daily.  Advised that patient take 15 mg twice daily for 1 week and then increasing to 30 mg twice daily to minimize risk of side effects.  Patient reports that he may also resume doxazosin since this seems to be helpful for nightmares.  Discussed that doxazosin could be taken every night or that he could take it more consistently during times of increased stress, such as after being exposed to trauma trigger.  Recommend continuing to see Lina Sayre, [facility] for therapy. Patient follow-up with this provider in 1 month or sooner if clinically indicated. PTSD (post-traumatic stress disorder)  MDD (major depressive disorder), recurrent episode, mild (HCC)  Insomnia due to other mental disorder  Please see After  Visit Summary for patient specific instructions.  Future Appointments  Date Time Provider [place]  11/13/2018 11:00 AM Lina Sayre, [place] CP-CP None  12/05/2018  9:30 AM Thayer Headings, PMHNP CP-CP None  12/11/2018 10:00 AM Lina Sayre, [facility] CP-CP None     No orders of the defined types were placed in this encounter.     -------------------------------

## 2018-11-09 ENCOUNTER — Encounter: Payer: Self-pay | Admitting: Psychiatry

## 2018-11-13 ENCOUNTER — Ambulatory Visit: Payer: BLUE CROSS/BLUE SHIELD | Admitting: Psychiatry

## 2018-12-05 ENCOUNTER — Ambulatory Visit: Payer: BLUE CROSS/BLUE SHIELD | Admitting: Psychiatry

## 2018-12-11 ENCOUNTER — Encounter: Payer: Self-pay | Admitting: Psychiatry

## 2018-12-11 ENCOUNTER — Ambulatory Visit (INDEPENDENT_AMBULATORY_CARE_PROVIDER_SITE_OTHER): Payer: BC Managed Care – PPO | Admitting: Psychiatry

## 2018-12-11 ENCOUNTER — Other Ambulatory Visit: Payer: Self-pay

## 2018-12-11 DIAGNOSIS — F4310 Post-traumatic stress disorder, unspecified: Secondary | ICD-10-CM

## 2018-12-11 NOTE — Progress Notes (Signed)
[facility] Counselor/Therapist Progress Note  Patient ID: Shawn York, MRN: [id number],    Date: 12/11/2018  Time Spent: 44 minutes Start time 10:00 AM end time 10:44 AM Virtual Visit via Telephone Note I connected with patient by a video enabled telemedicine application or telephone, with their informed consent, and verified patient privacy and that I am speaking with the correct person using two identifiers. I discussed the limitations, risks, security and privacy concerns of performing psychotherapy and management service by telephone and the availability of in person appointments. I also discussed with the patient that there may be a patient responsible charge related to this service. The patient expressed understanding and agreed to proceed. I discussed the treatment planning with the patient. The patient was provided an opportunity to ask questions and all were answered. The patient agreed with the plan and demonstrated an understanding of the instructions. The patient was advised to call  our office if  symptoms worsen or feel they are in a crisis state and need immediate contact. Patient was at his home clinician was at her home.  Treatment Type: Individual Therapy  Reported Symptoms: stress/anxiety, triggered responses,sleep issues  Mental Status Exam:  Appearance:   NA     Behavior:  Sharing  Motor:  NA  Speech/Language:   Normal Rate  Affect:  Appropriate  Mood:  normal  Thought process:  normal  Thought content:    WNL  Sensory/Perceptual disturbances:    WNL  Orientation:  oriented to person, place, time/date and situation  Attention:  Fair  Concentration:  Fair  Memory:  Immediate;   [place] of knowledge:   Good  Insight:    Good  Judgment:   Good  Impulse Control:  Good   Risk Assessment: Danger to Self:  No Self-injurious Behavior: No Danger to Others: No Duty to Warn:no Physical Aggression / Violence:No  Access to Firearms a concern: No  Gang  Involvement:No   Subjective: Met with patient via phone.  Patient reported that plans from last session had been very helpful and he and his wife are doing better.  There have been some triggered moments in the relationship still.  He reported he was able to talk to his wife about the situation.  One thing that she reported which helped him understand her better, was she felt he would not have gotten upset if his mother had said the same thing she said.  Discussed with her not that was the truth for him and why.  From that patient was able to develop a CBT filter to help himself when he starts feeling the triggered response occur when they are communicating.  He was able to acknowledge that he knows her intent is not bad so he has to keep that the front of his brain.  Patient also discussed the current situation and how he has been impacted.  Patient shared that he is thankful that he is at his new workplace and that is making the experience more tolerable.  He also reported he is trying hard to focus on the good things that can come out of this rather than letting the negative thoughts cycle in a direction he does not want to go.  Ways for him to continue that were discussed with patient.  Patient also reported he is trying to set realistic expectations for himself and the situation rather than putting so much on his plate that he gets overwhelmed and shuts down.  Patient was  encouraged to feel positive about that decision and to continue using his self talk to help him think through choices.  Interventions: Cognitive Behavioral Therapy and Solution-Oriented/Positive Psychology  Diagnosis:   ICD-10-CM   1. PTSD (post-traumatic stress disorder) F43.10     Plan: 1.  Patient to continue to engage in individual counseling 1-2 times a month or as needed. 2.  Patient to identify and apply CBT, coping skills learned in session to decrease triggered responses and anxiety symptoms. 3.  Patient to contact this  office, go to the local ED or call 911 if a crisis or emergency develops between visits.  Lina Sayre, [facility]   This record has been created using [employer].  Chart creation errors have been sought, but may not always have been located and corrected. Such creation errors do not reflect on the standard of medical care.

## 2018-12-26 ENCOUNTER — Other Ambulatory Visit: Payer: Self-pay | Admitting: Nurse Practitioner

## 2018-12-26 DIAGNOSIS — C712 Malignant neoplasm of temporal lobe: Secondary | ICD-10-CM

## 2018-12-28 ENCOUNTER — Ambulatory Visit (INDEPENDENT_AMBULATORY_CARE_PROVIDER_SITE_OTHER): Payer: BC Managed Care – PPO | Admitting: Psychiatry

## 2018-12-28 ENCOUNTER — Other Ambulatory Visit: Payer: Self-pay

## 2018-12-28 DIAGNOSIS — F4310 Post-traumatic stress disorder, unspecified: Secondary | ICD-10-CM | POA: Diagnosis not present

## 2018-12-28 NOTE — Progress Notes (Signed)
[facility] Counselor/Therapist Progress Note  Patient ID: Shawn York, MRN: [id number],    Date: 12/28/2018  Time Spent: 48 minutes start time 10:00 AM end time 10:48 AM Virtual Visit via Telephone Note Connected with patient by a video enabled telemedicine/telehealth application or telephone, with their informed consent, and verified patient privacy and that I am speaking with the correct person using two identifiers. I discussed the limitations, risks, security and privacy concerns of performing psychotherapy and management service by telephone and the availability of in person appointments. I also discussed with the patient that there may be a patient responsible charge related to this service. The patient expressed understanding and agreed to proceed. I discussed the treatment planning with the patient. The patient was provided an opportunity to ask questions and all were answered. The patient agreed with the plan and demonstrated an understanding of the instructions. The patient was advised to call  our office if  symptoms worsen or feel they are in a crisis state and need immediate contact.  Therapist Location: home Patient Location: home    Treatment Type: Individual Therapy  Reported Symptoms: sleep issues, triggered responses, nightmares  Mental Status Exam:  Appearance:   NA     Behavior:  Sharing  Motor:  NA  Speech/Language:   Normal Rate  Affect:  NA  Mood:  normal  Thought process:  normal  Thought content:    WNL  Sensory/Perceptual disturbances:    WNL  Orientation:  oriented to person, place, time/date and situation  Attention:  Fair  Concentration:  Fair  Memory:  Immediate;   [place] of knowledge:   Good  Insight:    Good  Judgment:   Good  Impulse Control:  Good   Risk Assessment: Danger to Self:  No Self-injurious Behavior: No Danger to Others: No Duty to Warn:no Physical Aggression / Violence:No  Access to Firearms a concern: No  Gang  Involvement:No   Subjective: Met with patient via phone.  Patient was watching his children again while session was taking place.  Patient explained that overall he had been functioning fairly well for him.  He has been able to maintain positive interactions with his wife.  He is also enjoying his work situation even though it is difficult to deal with the public at times.  Patient explained that he gets overwhelmed and anxious over her all that he is trying to do at home but he is try to use his CBT skills to talk himself through it and keep things in perspective.  Patient explained he is realizing his perspective makes a huge difference in his emotions so he is trying to work on his skills to help him stay grounded.  Patient was encouraged to feel good about the choices he is making and to recognize all the progress he is made since utilizing his coping skills appropriately.  Patient was also encouraged to remind himself regularly of the positive things in his life and the positive things he does for his family to help firm himself.  Patient agreed to continue following plans from session and utilizing his coping skills appropriately.  Interventions: Solution-Oriented/Positive Psychology  Diagnosis:   ICD-10-CM   1. PTSD (post-traumatic stress disorder) F43.10     Plan: 1.  Patient to continue to engage in individual counseling 2-4 times a month or as needed. 2.  Patient to identify and apply CBT, coping skills learned in session to decrease triggered responses and anxiety symptoms. 3.  Patient to contact this office, go to the local ED or call 911 if a crisis or emergency develops between visits.  Lina Sayre, [facility]  This record has been created using [employer].  Chart creation errors have been sought, but may not always have been located and corrected. Such creation errors do not reflect on the standard of medical care.

## 2018-12-30 ENCOUNTER — Encounter: Payer: Self-pay | Admitting: Psychiatry

## 2019-01-02 ENCOUNTER — Encounter: Payer: Self-pay | Admitting: Psychiatry

## 2019-01-02 ENCOUNTER — Ambulatory Visit (INDEPENDENT_AMBULATORY_CARE_PROVIDER_SITE_OTHER): Payer: BC Managed Care – PPO | Admitting: Psychiatry

## 2019-01-02 ENCOUNTER — Other Ambulatory Visit: Payer: Self-pay

## 2019-01-02 DIAGNOSIS — F5105 Insomnia due to other mental disorder: Secondary | ICD-10-CM | POA: Diagnosis not present

## 2019-01-02 DIAGNOSIS — F99 Mental disorder, not otherwise specified: Secondary | ICD-10-CM

## 2019-01-02 DIAGNOSIS — F4310 Post-traumatic stress disorder, unspecified: Secondary | ICD-10-CM

## 2019-01-02 DIAGNOSIS — F330 Major depressive disorder, recurrent, mild: Secondary | ICD-10-CM

## 2019-01-02 MED ORDER — BUSPIRONE HCL 30 MG PO TABS: 30.0000 mg | ORAL_TABLET | Freq: Two times a day (BID) | ORAL | 4 refills | Status: DC

## 2019-01-02 NOTE — Progress Notes (Signed)
Shawn York [id number] 04-08-1976 43 y.o.  Virtual Visit via Telephone Note  I connected with@ on 01/02/19 at 10:30 AM EDT by telephone and verified that I am speaking with the correct person using two identifiers.   I discussed the limitations, risks, security and privacy concerns of performing an evaluation and management service by telephone and the availability of in person appointments. I also discussed with the patient that there may be a patient responsible charge related to this service. The patient expressed understanding and agreed to proceed.   I discussed the assessment and treatment plan with the patient. The patient was provided an opportunity to ask questions and all were answered. The patient agreed with the plan and demonstrated an understanding of the instructions.   The patient was advised to call back or seek an in-person evaluation if the symptoms worsen or if the condition fails to improve as anticipated.  I provided 30 minutes of non-face-to-face time during this encounter.  The patient was located at home.  The provider was located at home.   Thayer Headings, PMHNP   Subjective:   Patient ID:  Shawn York is a 43 y.o. (DOB [birth year]) male.  Chief Complaint:  Chief Complaint  Patient presents with  . Anxiety  . Depression    HPI Shawn York presents for follow-up of anxiety and depression. "I think I am ok." He reports some positives associated with the pandemic- "it's become a little grounding, and put some things in perspective." He reports that he has continued to work during the pandemic. Reports that he has had more family time. Reports anxiety is "still there" and is not triggered as often. He reports that he has had increased anxiety when his personal space is invaded. Reports that anxiety has been the same with taking Buspar regularly. Reports that he has had less intrusive memories and "more anxious about present day stuff." Reports worry related to taking  care of family. He reports that his mood "has been ok when I am not anxious." Reports that his mood is good when he is at home and engaged in activity. Denies any change in sleep.  Reports sleep is not restful. No change in app. He reports energy and motivation have been ok. Concentration has been ok. Denies SI.  He reports that he has been working on some home renovations.    Reports that he had an apt at [facility] for f/u for re-check due to h/o Brain surgery. He will have an MRI this month.   Past Psychiatric Medication Trials: Trintellix Rexulti Trileptal Buspar Remeron Zoloft- Sexual side effects Hydroxyzine- Drowsiness Doxazosin  Review of Systems:  Review of Systems  Musculoskeletal: Negative for gait problem.  Neurological: Negative for tremors.  Psychiatric/Behavioral:       Please refer to HPI    Medications: I have reviewed the patient's current medications.  Current Outpatient Medications  Medication Sig Dispense Refill  . benazepril (LOTENSIN) 20 MG tablet Take 20 mg by mouth daily.    . cetirizine-pseudoephedrine (ZYRTEC-D) 5-120 MG tablet Take 1 tablet by mouth 2 (two) times daily.    . Oxcarbazepine (TRILEPTAL) 300 MG tablet Take 1 tablet (300 mg total) by mouth 2 (two) times daily. 60 tablet 0  . busPIRone (BUSPAR) 30 MG tablet Take 1 tablet (30 mg total) by mouth 2 (two) times daily for 30 days. 60 tablet 4  . doxazosin (CARDURA) 4 MG tablet Take 1 tablet (4 mg total) by mouth daily for 30 days. (Patient not  taking: Reported on 11/08/2018) 30 tablet 4  . EPINEPHrine 0.3 mg/0.3 mL IJ SOAJ injection Inject 0.3 mg into the muscle once. As needed for severe allergic reaction     No current facility-administered medications for this visit.     Medication Side Effects: None  Allergies:  Allergies  Allergen Reactions  . Contrast Media [Iodinated Diagnostic Agents] Rash  . Penicillins Shortness Of Breath    Has patient had a PCN reaction causing immediate rash,  facial/tongue/throat swelling, SOB or lightheadedness with hypotension: Yes Has patient had a PCN reaction causing severe rash involving mucus membranes or skin necrosis: No Has patient had a PCN reaction that required hospitalization No Has patient had a PCN reaction occurring within the last 10 years: No If all of the above answers are "NO", then may proceed with Cephalosporin use.   Marland Kitchen Shellfish Allergy Rash    Past Medical History:  Diagnosis Date  . 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 Hypertension   . PTSD (post-traumatic stress disorder)   . Seizures ([place])     Family History  Problem Relation Age of Onset  . Cancer Other        brain cancer  . Cancer Father        skin cancer  . Cancer Maternal Grandmother 29       brain cancer  . Alcohol abuse Brother   . Alzheimer's disease Paternal Grandmother   . Dementia Paternal Grandmother   . Heart disease Paternal Grandfather     Social History   Socioeconomic History  . Marital status: Single    Spouse name: Not on file  . Number of children: Not on file  . Years of education: Not on file  . Highest education level: Not on file  Occupational History  . Not on file  Social Needs  . Financial resource strain: Not on file  . Food insecurity:    Worry: Not on file    Inability: Not on file  . Transportation needs:    Medical: Not on file    Non-medical: Not on file  Tobacco Use  . Smoking status: Former Smoker    Last attempt to quit: 02/25/2011    Years since quitting: 7.8  . Smokeless tobacco: Never Used  Substance and Sexual Activity  . Alcohol use: Yes    Alcohol/week: 14.0 standard drinks    Types: 14 Cans of beer per week  . Drug use: No  . Sexual activity: Yes    Comment: E-cigarette users  Lifestyle  . Physical activity:    Days per week: Not on file    Minutes per session: Not on file  . Stress: Not on file  Relationships  . Social connections:    Talks on  phone: Not on file    Gets together: Not on file    Attends religious service: Not on file    Active member of club or organization: Not on file    Attends meetings of clubs or organizations: Not on file    Relationship status: Not on file  . Intimate partner violence:    Fear of current or ex partner: Not on file    Emotionally abused: Not on file    Physically abused: Not on file    Forced sexual activity: Not on file  Other Topics Concern  . Not on file  Social History Narrative  . Not on file    Past Medical History, Surgical history,  Social history, and Family history were reviewed and updated as appropriate.   Please see review of systems for further details on the patient's review from today.   Objective:   Physical Exam:  There were no vitals taken for this visit.  Physical Exam Neurological:     Mental Status: He is alert and oriented to person, place, and time.     Cranial Nerves: No dysarthria.  Psychiatric:        Attention and Perception: Attention normal.        Speech: Speech normal.        Behavior: Behavior is cooperative.        Thought Content: Thought content normal. Thought content is not paranoid or delusional. Thought content does not include homicidal or suicidal ideation. Thought content does not include homicidal or suicidal plan.        Cognition and Memory: Cognition and memory normal.        Judgment: Judgment normal.     Comments: Mood presents as mildly anxious.     Lab Review:     Component Value Date/Time   NA 138 08/19/2016 0610   NA 141 06/21/2013 1505   K 3.5 08/19/2016 0610   K 4.2 06/21/2013 1505   CL 102 08/19/2016 0610   CO2 28 08/19/2016 0610   CO2 25 06/21/2013 1505   GLUCOSE 88 08/19/2016 0610   GLUCOSE 83 06/21/2013 1505   BUN 21 (H) 08/19/2016 0610   BUN 18.1 06/21/2013 1505   CREATININE 0.88 08/19/2016 0610   CREATININE 0.9 06/21/2013 1505   CALCIUM 9.1 08/19/2016 0610   CALCIUM 9.1 06/21/2013 1505   PROT 7.0  06/21/2013 1505   ALBUMIN 4.1 06/21/2013 1505   AST 15 06/21/2013 1505   ALT 13 06/21/2013 1505   ALKPHOS 59 06/21/2013 1505   BILITOT 0.33 06/21/2013 1505   GFRNONAA >60 08/19/2016 0610   GFRAA >60 08/19/2016 0610       Component Value Date/Time   WBC 6.5 08/18/2016 1819   RBC 4.20 (L) 08/18/2016 1819   HGB 13.9 08/18/2016 1819   HGB 13.3 06/21/2013 1505   HCT 39.5 08/18/2016 1819   HCT 38.7 06/21/2013 1505   PLT 156 08/18/2016 1819   PLT 154 06/21/2013 1505   MCV 94.0 08/18/2016 1819   MCV 99.0 (H) 06/21/2013 1505   MCH 33.1 08/18/2016 1819   MCHC 35.2 08/18/2016 1819   RDW 13.1 08/18/2016 1819   RDW 13.3 06/21/2013 1505   LYMPHSABS 1.3 08/18/2016 1819   LYMPHSABS 1.1 06/21/2013 1505   MONOABS 0.5 08/18/2016 1819   MONOABS 0.5 06/21/2013 1505   EOSABS 0.1 08/18/2016 1819   EOSABS 0.1 06/21/2013 1505   BASOSABS 0.0 08/18/2016 1819   BASOSABS 0.0 06/21/2013 1505    No results found for: POCLITH, LITHIUM   No results found for: PHENYTOIN, PHENOBARB, VALPROATE, CBMZ   .res Assessment: Plan:   Patient reports that he would like to continue BuSpar 30 mg twice daily for anxiety and continue to monitor mood and anxiety signs and symptoms. Recommend continuing to see Lina Sayre, [facility] for therapy.  Patient to follow-up with this provider in 6 to 8 weeks or sooner if clinically indicated. Patient advised to contact office with any questions, adverse effects, or acute worsening in signs and symptoms.  PTSD (post-traumatic stress disorder) - Plan: busPIRone (BUSPAR) 30 MG tablet  MDD (major depressive disorder), recurrent episode, mild (HCC)  Insomnia due to other mental disorder  Please see After Visit  Summary for patient specific instructions.  Future Appointments  Date Time Provider [place]  01/11/2019  8:40 AM GI-315 MR 2 GI-315MRI GI-[address]    No orders of the defined types were placed in this encounter.     -------------------------------

## 2019-01-11 ENCOUNTER — Other Ambulatory Visit: Payer: Self-pay

## 2019-01-28 ENCOUNTER — Ambulatory Visit
Admission: RE | Admit: 2019-01-28 | Discharge: 2019-01-28 | Disposition: A | Discharge status: Home / Self Care | Payer: BLUE CROSS/BLUE SHIELD | Source: Ambulatory Visit | Attending: Nurse Practitioner | Admitting: Nurse Practitioner

## 2019-01-28 DIAGNOSIS — C712 Malignant neoplasm of temporal lobe: Secondary | ICD-10-CM

## 2019-01-28 MED ORDER — GADOBENATE DIMEGLUMINE 529 MG/ML IV SOLN
17.0000 mL | Freq: Once | INTRAVENOUS | Status: AC | PRN
  Administered 2019-01-28: 17 mL via INTRAVENOUS

## 2019-02-07 DIAGNOSIS — C712 Malignant neoplasm of temporal lobe: Secondary | ICD-10-CM | POA: Diagnosis not present

## 2019-03-12 DIAGNOSIS — H9041 Sensorineural hearing loss, unilateral, right ear, with unrestricted hearing on the contralateral side: Secondary | ICD-10-CM | POA: Diagnosis not present

## 2019-03-12 DIAGNOSIS — C712 Malignant neoplasm of temporal lobe: Secondary | ICD-10-CM | POA: Diagnosis not present

## 2019-03-12 DIAGNOSIS — H9311 Tinnitus, right ear: Secondary | ICD-10-CM | POA: Diagnosis not present

## 2019-05-29 ENCOUNTER — Other Ambulatory Visit: Payer: Self-pay

## 2019-05-29 ENCOUNTER — Encounter: Payer: Self-pay | Admitting: Psychiatry

## 2019-05-29 ENCOUNTER — Ambulatory Visit (INDEPENDENT_AMBULATORY_CARE_PROVIDER_SITE_OTHER): Payer: BC Managed Care – PPO | Admitting: Psychiatry

## 2019-05-29 DIAGNOSIS — F4310 Post-traumatic stress disorder, unspecified: Secondary | ICD-10-CM

## 2019-05-29 NOTE — Progress Notes (Signed)
[facility] Counselor/Therapist Progress Note  Patient ID: Shawn York, MRN: [id number],    Date: 05/29/2019  Time Spent: 45 minutes start time 9:01 AM end time 9:46 AM Virtual Visit via Telephone Note Connected with patient by a video enabled telemedicine/telehealth application or telephone, with their informed consent, and verified patient privacy and that I am speaking with the correct person using two identifiers. I discussed the limitations, risks, security and privacy concerns of performing psychotherapy and management service by telephone and the availability of in person appointments. I also discussed with the patient that there may be a patient responsible charge related to this service. The patient expressed understanding and agreed to proceed. I discussed the treatment planning with the patient. The patient was provided an opportunity to ask questions and all were answered. The patient agreed with the plan and demonstrated an understanding of the instructions. The patient was advised to call  our office if  symptoms worsen or feel they are in a crisis state and need immediate contact.   Therapist Location: Office Patient Location: home   Treatment Type: Individual Therapy  Reported Symptoms: sadness, triggered responses, anxiety, low motivation  Mental Status Exam:  Appearance:   NA     Behavior:  Sharing  Motor:  NA  Speech/Language:   Normal Rate  Affect:  NA  Mood:  anxious  Thought process:  normal  Thought content:    WNL  Sensory/Perceptual disturbances:    WNL  Orientation:  oriented to person, place, time/date and situation  Attention:  Good  Concentration:  Good  Memory:  WNL  [place] of knowledge:   Good  Insight:    Good  Judgment:   Good  Impulse Control:  Good   Risk Assessment: Danger to Self:  No Self-injurious Behavior: No Danger to Others: No Duty to Warn:no Physical Aggression / Violence:No  Access to Firearms a concern: No  Gang  Involvement:No   Subjective: Met with patient via phone due to his situation he could not get to the office for a session. He shared that he has been okay overall.  He explained he has had ups and downs with himself and his marriage.  He reported that he and his wife have had some hard talks but it has been helpful.  Patient went on to explain her biggest concern with him is that he has a tendency to shut down and disconnect from the family.  Discussed where that might be coming from with patient.  Patient was able to recognize that he does have a tendency to get very involved in projects at home and at work which does keeps thoughts engaged.  He went on to share that he remembered as a child having to shut down his emotions so that he could function.  He shared being a Jehovah witness left him with multiple expectations that he could never live up to.  He explained he learned just to disconnect and shut off his emotions so that he could function.  Encouraged patient to realize that things that allow [place] to function sometimes as children are not functional as adults and that may be part of what seems to be occurring with him currently.  Discussed different grounding techniques that he can utilize to try and get more present in his body and aware of surroundings rather than disconnecting and just going into robot mode.  Patient agreed to try and work on those over the next few weeks.  Also had  him think through what that 69 /10-year-old part of him needed to know so that he could function.  Patient reported he needed to know that he was safe and that he was free.  Discussed the importance of reminding himself of that on a regular basis.  Patient was also encouraged to get back to exercising since he was seeming to do better when he had that as a release as well.  Interventions: Cognitive Behavioral Therapy and Solution-Oriented/Positive Psychology  Diagnosis:   ICD-10-CM   1. PTSD (post-traumatic stress  disorder)  F43.10     Plan: Patient is to work on doing grounding exercises regularly to make sure he stays present and aware of what is going on around him on a regular basis rather than staying in a triggered like state.  Patient is also to remind himself that he is safe and he is free to help manage trigger responses.  Patient is also going to get back to exercising to release emotions from his body appropriately.  Long-term goal: Interacting normally with friends and family without irrational fears or intrusive thoughts that control behavior Short-term goal: Acknowledge the need to implement anger control techniques  Lina Sayre, [facility]

## 2019-06-06 DIAGNOSIS — H6501 Acute serous otitis media, right ear: Secondary | ICD-10-CM | POA: Diagnosis not present

## 2019-06-06 DIAGNOSIS — Z23 Encounter for immunization: Secondary | ICD-10-CM | POA: Diagnosis not present

## 2019-08-27 DIAGNOSIS — H9041 Sensorineural hearing loss, unilateral, right ear, with unrestricted hearing on the contralateral side: Secondary | ICD-10-CM | POA: Diagnosis not present

## 2019-08-27 DIAGNOSIS — H9311 Tinnitus, right ear: Secondary | ICD-10-CM | POA: Diagnosis not present

## 2019-09-12 DIAGNOSIS — H9041 Sensorineural hearing loss, unilateral, right ear, with unrestricted hearing on the contralateral side: Secondary | ICD-10-CM | POA: Diagnosis not present

## 2019-11-05 ENCOUNTER — Ambulatory Visit (INDEPENDENT_AMBULATORY_CARE_PROVIDER_SITE_OTHER): Payer: BC Managed Care – PPO | Admitting: Psychiatry

## 2019-11-05 ENCOUNTER — Other Ambulatory Visit: Payer: Self-pay

## 2019-11-05 DIAGNOSIS — F4310 Post-traumatic stress disorder, unspecified: Secondary | ICD-10-CM

## 2019-11-05 NOTE — Progress Notes (Signed)
      [facility] Counselor/Therapist Progress Note  Patient ID: Shawn York, MRN: [id number],    Date: 11/05/2019  Time Spent: 52 minutes start time 11:13 AM end time 12:05 PM  Treatment Type: Individual Therapy  Reported Symptoms: anxiety, sadness, irritability, triggered responses  Mental Status Exam:  Appearance:   Casual     Behavior:  Appropriate  Motor:  Normal  Speech/Language:   Normal Rate  Affect:  Appropriate  Mood:  anxious  Thought process:  normal  Thought content:    WNL  Sensory/Perceptual disturbances:    WNL  Orientation:  oriented to person, place, time/date and situation  Attention:  Good  Concentration:  Good  Memory:  WNL  Fund of knowledge:   Good  Insight:    Good  Judgment:   Good  Impulse Control:  Good   Risk Assessment: Danger to Self:  No Self-injurious Behavior: No Danger to Others: No Duty to Warn:no Physical Aggression / Violence:No  Access to Firearms a concern: No  Gang Involvement:No   Subjective: Patient was present for session.  He shared that he and his wife are still having some issues.  He explained that they trigger each other and than they are at an impasse and can't get to the other side of it. He stated it feels like power struggles occurring and that is triggering it made him more.  Patient reported that things are going well at work and overall things are going well with the kids.  He is also starting to develop different options for income and he is excited about those possibilities.  Updated treatment plan with patient could not sign due to coronavirus.  Patient was given handouts on CBT for couples and the importance of recognizing that he and his wife are getting triggered was discussed with patient.  Patient was encouraged to think about what was modeled to him as a child of appropriate interactions with discussions and what was modeled for his wife.  As he discussed what they had both experienced he realized they were  replicating some of the dysfunction within their family systems.  Discussed the importance of talking to his wife about his feelings and what he is noticing.  Encouraged him to go through one of the handouts with his wife so that they can see that the core believes are being triggered leading to automatic negative thoughts and emotions physiological responses and then behaviors.  Patient is also going to talk to her about the possibility of coming into a session to address triggers and different manners of communication to help things move forward within the relationship.  Interventions: Solution-Oriented/Positive Psychology  Diagnosis:   ICD-10-CM   1. PTSD (post-traumatic stress disorder)  F43.10     Plan: Patient is to use CBT and coping skills to manage triggered responses appropriately.  Patient is to communicate with his wife about his concerns and both of the ways they are getting triggered in their discussions/conflicts.  Patient is to continue exercising to release negative emotions appropriately Long-term goal: Interacting normally with friends and families without irrational fears or intrusive thoughts that control behaviors Short-term goal: Verbalize hopeful and positive statements regarding the future  Lina Sayre, [facility]

## 2019-11-12 ENCOUNTER — Ambulatory Visit: Payer: BC Managed Care – PPO | Admitting: Psychiatry

## 2020-01-21 ENCOUNTER — Ambulatory Visit (INDEPENDENT_AMBULATORY_CARE_PROVIDER_SITE_OTHER): Payer: BC Managed Care – PPO | Admitting: Psychiatry

## 2020-01-21 ENCOUNTER — Other Ambulatory Visit: Payer: Self-pay

## 2020-01-21 DIAGNOSIS — F4310 Post-traumatic stress disorder, unspecified: Secondary | ICD-10-CM

## 2020-01-21 NOTE — Progress Notes (Signed)
      [facility] Counselor/Therapist Progress Note  Patient ID: Shawn York, MRN: [id number],    Date: 01/21/2020  Time Spent: 51 minutes start time 12:05 PM and time 12:56 PM  Treatment Type: Individual Therapy  Reported Symptoms: anxiety, sleep issues, getting overwhelmed  Mental Status Exam:  Appearance:   Well Groomed     Behavior:  Appropriate  Motor:  Normal  Speech/Language:   Normal Rate  Affect:  Appropriate  Mood:  anxious  Thought process:  normal  Thought content:    WNL  Sensory/Perceptual disturbances:    WNL  Orientation:  oriented to person, place, time/date and situation  Attention:  Good  Concentration:  Good  Memory:  WNL  Fund of knowledge:   Good  Insight:    Good  Judgment:   Good  Impulse Control:  Good   Risk Assessment: Danger to Self:  No Self-injurious Behavior: No Danger to Others: No Duty to Warn:no Physical Aggression / Violence:No  Access to Firearms a concern: No  Gang Involvement:No   Subjective: Patient was present for session.  Patient had brought his wife to session due to getting triggered with in different situations with her.  Patient's wife admitted that she can tell he pulls away and then she gets critical which seems to make things worse and escalate his withdrawal.  Patient was able to communicate what goes on with him to his wife.  Encouraged her and him to realize they both have different patterns of interacting based on their upbringing and what was modeled in their families.  Patient explained that with the traumas he is encouraged in his life with his family it became apparent that his voice was not relevant so he learned just to withdrawal.  Discussed different communication skills and different ways to interact with each other so that patient does not get as triggered when interacting with his wife.  Patient was also encouraged to try some different things when he does recognize he is getting triggered so that he can ground  himself and be able to come back and finish interactions with his wife.  Wife was encouraged to try and recognize when he is getting triggered and give him and her time apart to get calm before discussions are continued.  Both reported feeling positive at the end of session and more hopeful that things can move in a positive direction.  They were given handouts from the CBT couples toolbox to try and help them with communication and recognizing the triggered patterns that can occur.  Interventions: Cognitive Behavioral Therapy and Solution-Oriented/Positive Psychology  Diagnosis:   ICD-10-CM   1. PTSD (post-traumatic stress disorder)  F43.10     Plan: Patient is to use CBT and coping skills to decrease triggered responses.  Patient is to continue working on exercising regularly to release negative emotions appropriately.  Patient is to utilize handouts discussed in session to help him be able to communicate with his wife and recognize when he is getting triggered so that he can manage it appropriately. Long-term goal: Interacting normally with friends and family without irrational fears or intrusive thoughts that control behavior Short-term goal: Verbalize hopeful and positive statements regarding the future  Lina Sayre, [facility]

## 2020-01-24 DIAGNOSIS — M21621 Bunionette of right foot: Secondary | ICD-10-CM | POA: Diagnosis not present

## 2020-01-24 DIAGNOSIS — M25571 Pain in right ankle and joints of right foot: Secondary | ICD-10-CM | POA: Diagnosis not present

## 2020-01-24 DIAGNOSIS — M79671 Pain in right foot: Secondary | ICD-10-CM | POA: Diagnosis not present

## 2020-03-24 ENCOUNTER — Other Ambulatory Visit: Payer: Self-pay

## 2020-03-24 ENCOUNTER — Ambulatory Visit (INDEPENDENT_AMBULATORY_CARE_PROVIDER_SITE_OTHER): Payer: BC Managed Care – PPO | Admitting: Psychiatry

## 2020-03-24 ENCOUNTER — Encounter: Payer: Self-pay | Admitting: Psychiatry

## 2020-03-24 DIAGNOSIS — F5105 Insomnia due to other mental disorder: Secondary | ICD-10-CM | POA: Diagnosis not present

## 2020-03-24 DIAGNOSIS — F4310 Post-traumatic stress disorder, unspecified: Secondary | ICD-10-CM

## 2020-03-24 DIAGNOSIS — F99 Mental disorder, not otherwise specified: Secondary | ICD-10-CM | POA: Diagnosis not present

## 2020-03-24 DIAGNOSIS — F330 Major depressive disorder, recurrent, mild: Secondary | ICD-10-CM | POA: Diagnosis not present

## 2020-03-24 MED ORDER — VORTIOXETINE HBR 20 MG PO TABS: 20.0000 mg | ORAL_TABLET | Freq: Every day | ORAL | 1 refills | Status: DC

## 2020-03-24 MED ORDER — BUSPIRONE HCL 15 MG PO TABS: ORAL_TABLET | ORAL | 1 refills | Status: DC

## 2020-03-24 NOTE — Progress Notes (Signed)
Shawn York [id number] 06/20/1976 44 y.o.  Subjective:   Patient ID:  Shawn York is a 44 y.o. (DOB 1976/05/22) male.  Chief Complaint:  Chief Complaint  Patient presents with  . Anxiety  . Depression  . Insomnia    HPI Shawn York presents to the office today for follow-up of anxiety, depression, and sleep disturbance. He reports that he and his wife have seen Shawn York, [facility] together. He reports that his wife has been taking medication for anxiety. He reports that they are doin well now. He noticed that when she was more anxious, it tended to increase his anxiety. He noticed some over-personalization and "over-reacting" to things, ie if she reminded him of something he would take it personally. He reports that at times he would take things as a personal attack when that was not wife's intent. He reports that he notices some worry and anxious thoughts. Denies panic s/s. Continues to experience some intrusive memories and re-expereiencing. Nightmares about every other night. He reports that he has had some depression and anxiety is chief concern. He reports some chronic irritability. Reports that anxiety often manifests as frustration and anger. Some occasional difficulty with concentration. He reports that his motivation typically is driven by avoiding being idle since anxiety is worse when he is not busy. Energy is ok. He reports that he has "an aversion to sleep" and this is not completely related to nightmares. Estimates sleeping 4-6 hours in a 24-hour span. Appetite has been ok. Does not eat breakfast. Denies SI.   He reports that he had some Trintellix remaining and started taking it again about a week ago. He has been tolerating it well.   Working at [employer] Taking classes online and has some flexibility. Continues to work on remodeling their home.   Past Psychiatric Medication Trials: Trintellix Rexulti Trileptal Buspar Remeron Zoloft- Sexual side effects Hydroxyzine-  Drowsiness Doxazosin    Review of Systems:  Review of Systems  Musculoskeletal: Negative for gait problem.  Neurological: Negative for tremors.  Psychiatric/Behavioral:       Please refer to HPI    Medications: I have reviewed the patient's current medications.  Current Outpatient Medications  Medication Sig Dispense Refill  . benazepril (LOTENSIN) 20 MG tablet Take 20 mg by mouth daily.    . cetirizine-pseudoephedrine (ZYRTEC-D) 5-120 MG tablet Take 1 tablet by mouth 2 (two) times daily as needed.     . Oxcarbazepine (TRILEPTAL) 300 MG tablet Take 1 tablet (300 mg total) by mouth 2 (two) times daily. 60 tablet 0  . vortioxetine HBr (TRINTELLIX) 20 MG TABS tablet Take 1 tablet (20 mg total) by mouth daily. 30 tablet 1  . busPIRone (BUSPAR) 15 MG tablet Take 1/3 tablet p.o. twice daily for 1 week, then take 2/3 tablet p.o. twice daily for 1 week, then take 1 tablet p.o. twice daily 60 tablet 1  . EPINEPHrine 0.3 mg/0.3 mL IJ SOAJ injection Inject 0.3 mg into the muscle once. As needed for severe allergic reaction     No current facility-administered medications for this visit.    Medication Side Effects: None  Allergies:  Allergies  Allergen Reactions  . Contrast Media [Iodinated Diagnostic Agents] Rash  . Penicillins Shortness Of Breath    Has patient had a PCN reaction causing immediate rash, facial/tongue/throat swelling, SOB or lightheadedness with hypotension: Yes Has patient had a PCN reaction causing severe rash involving mucus membranes or skin necrosis: No Has patient had a PCN reaction that required hospitalization  No Has patient had a PCN reaction occurring within the last 10 years: No If all of the above answers are "NO", then may proceed with Cephalosporin use.   Marland Kitchen Shellfish Allergy Rash    Past Medical History:  Diagnosis Date  . 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 Hypertension   . PTSD (post-traumatic stress disorder)    . Seizures ([place])     Family History  Problem Relation Age of Onset  . Cancer Other        brain cancer  . Cancer Father        skin cancer  . Cancer Maternal Grandmother 30       brain cancer  . Alcohol abuse Brother   . Alzheimer's disease Paternal Grandmother   . Dementia Paternal Grandmother   . Heart disease Paternal Grandfather     Social History   Socioeconomic History  . Marital status: Single    Spouse name: Not on file  . Number of children: Not on file  . Years of education: Not on file  . Highest education level: Not on file  Occupational History  . Not on file  Tobacco Use  . Smoking status: Former Smoker    Quit date: 02/25/2011    Years since quitting: 9.0  . Smokeless tobacco: Never Used  Substance and Sexual Activity  . Alcohol use: Yes    Alcohol/week: 14.0 standard drinks    Types: 14 Cans of beer per week  . Drug use: No  . Sexual activity: Yes    Comment: E-cigarette users  Other Topics Concern  . Not on file  Social History Narrative  . Not on file   Social Determinants of Health   Financial Resource Strain:   . Difficulty of Paying Living Expenses:   Food Insecurity:   . Worried About Charity fundraiser in the Last Year:   . Arboriculturist in the Last Year:   Transportation Needs:   . Film/video editor (Medical):   Marland Kitchen Lack of Transportation (Non-Medical):   Physical Activity:   . Days of Exercise per Week:   . Minutes of Exercise per Session:   Stress:   . Feeling of Stress :   Social Connections:   . Frequency of Communication with Friends and Family:   . Frequency of Social Gatherings with Friends and Family:   . Attends Religious Services:   . Active Member of Clubs or Organizations:   . Attends Archivist Meetings:   Marland Kitchen Marital Status:   Intimate Partner Violence:   . Fear of Current or Ex-Partner:   . Emotionally Abused:   Marland Kitchen Physically Abused:   . Sexually Abused:     Past Medical History, Surgical  history, Social history, and Family history were reviewed and updated as appropriate.   Please see review of systems for further details on the patient's review from today.   Objective:   Physical Exam:  There were no vitals taken for this visit.  Physical Exam Constitutional:      General: He is not in acute distress. Musculoskeletal:        General: No deformity.  Neurological:     Mental Status: He is alert and oriented to person, place, and time.     Coordination: Coordination normal.  Psychiatric:        Attention and Perception: Attention and perception normal. He does not perceive auditory or visual hallucinations.  Mood and Affect: Mood is anxious and depressed. Affect is not labile, blunt, angry or inappropriate.        Speech: Speech normal.        Behavior: Behavior normal.        Thought Content: Thought content normal. Thought content is not paranoid or delusional. Thought content does not include homicidal or suicidal ideation. Thought content does not include homicidal or suicidal plan.        Cognition and Memory: Cognition and memory normal.        Judgment: Judgment normal.     Comments: Insight intact     Lab Review:     Component Value Date/Time   NA 138 08/19/2016 0610   NA 141 06/21/2013 1505   K 3.5 08/19/2016 0610   K 4.2 06/21/2013 1505   CL 102 08/19/2016 0610   CO2 28 08/19/2016 0610   CO2 25 06/21/2013 1505   GLUCOSE 88 08/19/2016 0610   GLUCOSE 83 06/21/2013 1505   BUN 21 (H) 08/19/2016 0610   BUN 18.1 06/21/2013 1505   CREATININE 0.88 08/19/2016 0610   CREATININE 0.9 06/21/2013 1505   CALCIUM 9.1 08/19/2016 0610   CALCIUM 9.1 06/21/2013 1505   PROT 7.0 06/21/2013 1505   ALBUMIN 4.1 06/21/2013 1505   AST 15 06/21/2013 1505   ALT 13 06/21/2013 1505   ALKPHOS 59 06/21/2013 1505   BILITOT 0.33 06/21/2013 1505   GFRNONAA >60 08/19/2016 0610   GFRAA >60 08/19/2016 0610       Component Value Date/Time   WBC 6.5 08/18/2016 1819    RBC 4.20 (L) 08/18/2016 1819   HGB 13.9 08/18/2016 1819   HGB 13.3 06/21/2013 1505   HCT 39.5 08/18/2016 1819   HCT 38.7 06/21/2013 1505   PLT 156 08/18/2016 1819   PLT 154 06/21/2013 1505   MCV 94.0 08/18/2016 1819   MCV 99.0 (H) 06/21/2013 1505   MCH 33.1 08/18/2016 1819   MCHC 35.2 08/18/2016 1819   RDW 13.1 08/18/2016 1819   RDW 13.3 06/21/2013 1505   LYMPHSABS 1.3 08/18/2016 1819   LYMPHSABS 1.1 06/21/2013 1505   MONOABS 0.5 08/18/2016 1819   MONOABS 0.5 06/21/2013 1505   EOSABS 0.1 08/18/2016 1819   EOSABS 0.1 06/21/2013 1505   BASOSABS 0.0 08/18/2016 1819   BASOSABS 0.0 06/21/2013 1505    No results found for: POCLITH, LITHIUM   No results found for: PHENYTOIN, PHENOBARB, VALPROATE, CBMZ   .res Assessment: Plan:   Discussed treatment plan and pt's preferences regarding medication. He indicates that he would like to resume Trintellix 20 mg po qd and Buspar 30 mg BID since anxiety seemed to be better controlled in the past.  Will re-start Buspar for anxiety. Start BuSpar 15 mg 1/3 tablet twice daily for 1 week, then increase to 2/3 tablet twice daily for 1 week, then increase to 1 tablet twice daily for anxiety. Pt reports that he would like to continue Trintellix 20 mg po qd. Discussed that he may begin to experience GI side effects as drug levels continue to increase. Provided pt with Trintellix 10 mg samples in case he begins to experience GI side effects.  Recommend continuing therapy with Shawn York, [facility]. Pt to f/u in 4-6 weeks or sooner if clinically indicated.   Ata was seen today for anxiety, depression and insomnia.  Diagnoses and all orders for this visit:  PTSD (post-traumatic stress disorder) -     busPIRone (BUSPAR) 15 MG tablet; Take 1/3 tablet p.o.  twice daily for 1 week, then take 2/3 tablet p.o. twice daily for 1 week, then take 1 tablet p.o. twice daily -     vortioxetine HBr (TRINTELLIX) 20 MG TABS tablet; Take 1 tablet (20 mg total) by mouth  daily.  MDD (major depressive disorder), recurrent episode, mild (HCC) -     vortioxetine HBr (TRINTELLIX) 20 MG TABS tablet; Take 1 tablet (20 mg total) by mouth daily.  Insomnia due to other mental disorder     Please see After Visit Summary for patient specific instructions.  Future Appointments  Date Time Provider [place]  03/30/2020  8:00 AM Shawn York, [facility] CP-CP None  04/23/2020  9:00 AM Thayer Headings, PMHNP CP-CP None  04/30/2020  8:00 AM Shawn York, [facility] CP-CP None    No orders of the defined types were placed in this encounter.   -------------------------------

## 2020-03-30 ENCOUNTER — Ambulatory Visit (INDEPENDENT_AMBULATORY_CARE_PROVIDER_SITE_OTHER): Payer: BC Managed Care – PPO | Admitting: Psychiatry

## 2020-03-30 ENCOUNTER — Other Ambulatory Visit: Payer: Self-pay

## 2020-03-30 DIAGNOSIS — F4310 Post-traumatic stress disorder, unspecified: Secondary | ICD-10-CM

## 2020-03-30 NOTE — Progress Notes (Signed)
[facility] Counselor/Therapist Progress Note  Patient ID: Shawn York, MRN: [id number],    Date: 03/30/2020  Time Spent: 45 minutes start time 8:13 AM end time 8:58 AM  Treatment Type: Individual Therapy  Reported Symptoms: sleep issues, triggered responses, anxiety, focusing issues  Mental Status Exam:  Appearance:   Casual and Neat     Behavior:  Appropriate  Motor:  Normal  Speech/Language:   Normal Rate  Affect:  Appropriate  Mood:  normal  Thought process:  normal  Thought content:    WNL  Sensory/Perceptual disturbances:    WNL  Orientation:  oriented to person, place, time/date and situation  Attention:  Good  Concentration:  Good  Memory:  WNL  Fund of knowledge:   Good  Insight:    Good  Judgment:   Good  Impulse Control:  Good   Risk Assessment: Danger to Self:  No Self-injurious Behavior: No Danger to Others: No Duty to Warn:no Physical Aggression / Violence:No  Access to Firearms a concern: No  Gang Involvement:No   Subjective: Patient was present for session.  Patient brought his wife in for session as well.  They both agreed that they were doing much better.  They had followed through on communication skills and that had been helpful.  Patient reported that he has had a decrease in the triggers overall which is been very positive.  He did share that there was 1 incident when he got very triggered with his wife.  He shared what happened.  Patient and wife were encouraged to think through why he got as upset as he did.  Both were able to realize that it was concerning his need for safety security and feeling like he has some control.  He shared that as a child he did not.  Encouraged patient to recognize when his body seems to have an intense response and at that point to start asking himself some questions.  Talked with wife about questions she can ask him to help him get grounded and be able to recognize what he needs to focus on at that time.  Also shared  that his tinnitus is worse and that is making him feel more isolated and alone.  Discussed how that is triggering and may impact their relationship.  Patient wife encouraged him to communicate with his audiologist again.  Patient admitted that he was referred to a program but the referral seem to follow through when he has not pursued it.  Encourage patient to pursue the referral to get some treatment for his tinnitus so that one of his issues that are very triggering can be resolved.  Discussed CBT cycle with patient thoughts lead to feelings lead to behaviors.  Reminded him of the importance of focusing on his thoughts.  Encouraged both patient and his wife to think through each day if something they appreciate about their spouse and then to share with them to continue working on changing thought patterns.  Patient agreed that that was a positive plan.  He wanted to return in 2 months.  Overall he reported having great progress and feeling positive about the direction that things are going.  Interventions: Cognitive Behavioral Therapy and Solution-Oriented/Positive Psychology  Diagnosis:   ICD-10-CM   1. PTSD (post-traumatic stress disorder)  F43.10     Plan: Patient is to continue using CBT and coping skills to decrease triggered responses.  Patient is to work on affirming his wife daily and she is to work  on affirming him daily.  Patient is to pursue the referral for his tinnitus to see if he can get some treatment for the issue.  Patient is to recognize when his body starts responding to different situations and focus on asking questions rather than just taking things personal.  Patient is to continue trying to exercise to release emotions appropriately. Long-term goal: Interact normally with friends family without irrational fears or intrusive thoughts that control behavior Short-term goal: Verbalize hopeful and positive statements regarding the future   Lina Sayre,  [facility]

## 2020-03-31 ENCOUNTER — Telehealth: Payer: Self-pay

## 2020-03-31 NOTE — Telephone Encounter (Signed)
Prior authorization submitted and approved through cover my meds for TRINTELLIX 20 MG TABS effective 03/30/2020-08/28/2038 with [facility] of [place] [phone] [id number], p [phone], f.  ID# [id number]

## 2020-04-15 DIAGNOSIS — C712 Malignant neoplasm of temporal lobe: Secondary | ICD-10-CM | POA: Diagnosis not present

## 2020-04-17 DIAGNOSIS — R2230 Localized swelling, mass and lump, unspecified upper limb: Secondary | ICD-10-CM | POA: Diagnosis not present

## 2020-04-23 ENCOUNTER — Ambulatory Visit: Payer: BC Managed Care – PPO | Admitting: Psychiatry

## 2020-04-24 ENCOUNTER — Other Ambulatory Visit: Payer: Self-pay

## 2020-04-24 ENCOUNTER — Ambulatory Visit (INDEPENDENT_AMBULATORY_CARE_PROVIDER_SITE_OTHER): Payer: BC Managed Care – PPO | Admitting: Psychiatry

## 2020-04-24 ENCOUNTER — Encounter: Payer: Self-pay | Admitting: Psychiatry

## 2020-04-24 VITALS — BP 147/98 | HR 85

## 2020-04-24 DIAGNOSIS — F515 Nightmare disorder: Secondary | ICD-10-CM | POA: Diagnosis not present

## 2020-04-24 DIAGNOSIS — F4310 Post-traumatic stress disorder, unspecified: Secondary | ICD-10-CM

## 2020-04-24 DIAGNOSIS — F330 Major depressive disorder, recurrent, mild: Secondary | ICD-10-CM

## 2020-04-24 MED ORDER — VORTIOXETINE HBR 20 MG PO TABS: 20.0000 mg | ORAL_TABLET | Freq: Every day | ORAL | 1 refills | Status: DC

## 2020-04-24 MED ORDER — BUSPIRONE HCL 30 MG PO TABS: 30.0000 mg | ORAL_TABLET | Freq: Two times a day (BID) | ORAL | 1 refills | Status: DC

## 2020-04-24 MED ORDER — DOXAZOSIN MESYLATE 4 MG PO TABS: ORAL_TABLET | ORAL | 1 refills | Status: DC

## 2020-04-24 NOTE — Progress Notes (Signed)
Laken Lobato [id number] June 01, 1976 44 y.o.  Subjective:   Patient ID:  Kymoni Monday is a 44 y.o. (DOB 04-07-1976) male.  Chief Complaint:  Chief Complaint  Patient presents with  . Anxiety  . Depression  . Sleeping Problem    HPI Jontrell Bushong presents to the office today for follow-up of anxiety, depression, and insomnia. He reports, "I still have anxiety." He reports that his anxiety is unimproved. He reports, "I get worked up" and reports that he is easily irritated. He avoids being still to try not to worry. He reports that he continues to over-personalize things. He reports "I don't feel much" in terms of mood. Continued intrusive memories and re-experiencing. Less night terrors with continued nightmares. He reports that he is not sleeping well. Appetite varies. Energy and motivation is ok and reports that he is running from his anxiety. He reports poor concentration and focus and that his wife reports that he is frequently distracted. Denies SI.   Past Psychiatric Medication Trials: Trintellix Rexulti Trileptal Buspar Remeron Zoloft- Sexual side effects Hydroxyzine- Drowsiness Doxazosin    Review of Systems:  Review of Systems  Gastrointestinal: Negative for nausea.  Musculoskeletal: Negative for gait problem.       He reports that he has a mass in his left arm  Neurological: Negative for tremors.  Psychiatric/Behavioral:       Please refer to HPI  He reports that his BP has recently been elevated.  Medications: I have reviewed the patient's current medications.  Current Outpatient Medications  Medication Sig Dispense Refill  . benazepril (LOTENSIN) 20 MG tablet Take 20 mg by mouth daily.    . cetirizine-pseudoephedrine (ZYRTEC-D) 5-120 MG tablet Take 1 tablet by mouth 2 (two) times daily as needed.     . Oxcarbazepine (TRILEPTAL) 300 MG tablet Take 1 tablet (300 mg total) by mouth 2 (two) times daily. 60 tablet 0  . busPIRone (BUSPAR) 30 MG tablet Take 1 tablet (30 mg  total) by mouth 2 (two) times daily. 60 tablet 1  . doxazosin (CARDURA) 4 MG tablet Take 1/2 tab po QHS x 4 days, then may increase to 1 tab po QHS 30 tablet 1  . EPINEPHrine 0.3 mg/0.3 mL IJ SOAJ injection Inject 0.3 mg into the muscle once. As needed for severe allergic reaction    . vortioxetine HBr (TRINTELLIX) 20 MG TABS tablet Take 1 tablet (20 mg total) by mouth daily. 30 tablet 1   No current facility-administered medications for this visit.    Medication Side Effects: None  Allergies:  Allergies  Allergen Reactions  . Contrast Media [Iodinated Diagnostic Agents] Rash  . Penicillins Shortness Of Breath    Has patient had a PCN reaction causing immediate rash, facial/tongue/throat swelling, SOB or lightheadedness with hypotension: Yes Has patient had a PCN reaction causing severe rash involving mucus membranes or skin necrosis: No Has patient had a PCN reaction that required hospitalization No Has patient had a PCN reaction occurring within the last 10 years: No If all of the above answers are "NO", then may proceed with Cephalosporin use.   Marland Kitchen Shellfish Allergy Rash    Past Medical History:  Diagnosis Date  . 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 Hypertension   . PTSD (post-traumatic stress disorder)   . Seizures ([place])     Family History  Problem Relation Age of Onset  . Cancer Other        brain cancer  . Cancer Father  skin cancer  . Cancer Maternal Grandmother 50       brain cancer  . Alcohol abuse Brother   . Alzheimer's disease Paternal Grandmother   . Dementia Paternal Grandmother   . Heart disease Paternal Grandfather     Social History   Socioeconomic History  . Marital status: Single    Spouse name: Not on file  . Number of children: Not on file  . Years of education: Not on file  . Highest education level: Not on file  Occupational History  . Not on file  Tobacco Use  . Smoking status: Former Smoker     Quit date: 02/25/2011    Years since quitting: 9.1  . Smokeless tobacco: Never Used  Substance and Sexual Activity  . Alcohol use: Yes    Alcohol/week: 14.0 standard drinks    Types: 14 Cans of beer per week  . Drug use: No  . Sexual activity: Yes    Comment: E-cigarette users  Other Topics Concern  . Not on file  Social History Narrative  . Not on file   Social Determinants of Health   Financial Resource Strain:   . Difficulty of Paying Living Expenses: Not on file  Food Insecurity:   . Worried About Charity fundraiser in the Last Year: Not on file  . Ran Out of Food in the Last Year: Not on file  Transportation Needs:   . Lack of Transportation (Medical): Not on file  . Lack of Transportation (Non-Medical): Not on file  Physical Activity:   . Days of Exercise per Week: Not on file  . Minutes of Exercise per Session: Not on file  Stress:   . Feeling of Stress : Not on file  Social Connections:   . Frequency of Communication with Friends and Family: Not on file  . Frequency of Social Gatherings with Friends and Family: Not on file  . Attends Religious Services: Not on file  . Active Member of Clubs or Organizations: Not on file  . Attends Archivist Meetings: Not on file  . Marital Status: Not on file  Intimate Partner Violence:   . Fear of Current or Ex-Partner: Not on file  . Emotionally Abused: Not on file  . Physically Abused: Not on file  . Sexually Abused: Not on file    Past Medical History, Surgical history, Social history, and Family history were reviewed and updated as appropriate.   Please see review of systems for further details on the patient's review from today.   Objective:   Physical Exam:  BP (!) 147/98   Pulse 85   Physical Exam Constitutional:      General: He is not in acute distress. Musculoskeletal:        General: No deformity.  Neurological:     Mental Status: He is alert and oriented to person, place, and time.      Coordination: Coordination normal.  Psychiatric:        Attention and Perception: Attention and perception normal. He does not perceive auditory or visual hallucinations.        Mood and Affect: Mood is anxious and depressed. Affect is not labile, blunt, angry or inappropriate.        Speech: Speech normal.        Behavior: Behavior normal.        Thought Content: Thought content normal. Thought content is not paranoid or delusional. Thought content does not include homicidal or suicidal ideation. Thought content  does not include homicidal or suicidal plan.        Cognition and Memory: Cognition and memory normal.        Judgment: Judgment normal.     Comments: Insight intact     Lab Review:     Component Value Date/Time   NA 138 08/19/2016 0610   NA 141 06/21/2013 1505   K 3.5 08/19/2016 0610   K 4.2 06/21/2013 1505   CL 102 08/19/2016 0610   CO2 28 08/19/2016 0610   CO2 25 06/21/2013 1505   GLUCOSE 88 08/19/2016 0610   GLUCOSE 83 06/21/2013 1505   BUN 21 (H) 08/19/2016 0610   BUN 18.1 06/21/2013 1505   CREATININE 0.88 08/19/2016 0610   CREATININE 0.9 06/21/2013 1505   CALCIUM 9.1 08/19/2016 0610   CALCIUM 9.1 06/21/2013 1505   PROT 7.0 06/21/2013 1505   ALBUMIN 4.1 06/21/2013 1505   AST 15 06/21/2013 1505   ALT 13 06/21/2013 1505   ALKPHOS 59 06/21/2013 1505   BILITOT 0.33 06/21/2013 1505   GFRNONAA >60 08/19/2016 0610   GFRAA >60 08/19/2016 0610       Component Value Date/Time   WBC 6.5 08/18/2016 1819   RBC 4.20 (L) 08/18/2016 1819   HGB 13.9 08/18/2016 1819   HGB 13.3 06/21/2013 1505   HCT 39.5 08/18/2016 1819   HCT 38.7 06/21/2013 1505   PLT 156 08/18/2016 1819   PLT 154 06/21/2013 1505   MCV 94.0 08/18/2016 1819   MCV 99.0 (H) 06/21/2013 1505   MCH 33.1 08/18/2016 1819   MCHC 35.2 08/18/2016 1819   RDW 13.1 08/18/2016 1819   RDW 13.3 06/21/2013 1505   LYMPHSABS 1.3 08/18/2016 1819   LYMPHSABS 1.1 06/21/2013 1505   MONOABS 0.5 08/18/2016 1819    MONOABS 0.5 06/21/2013 1505   EOSABS 0.1 08/18/2016 1819   EOSABS 0.1 06/21/2013 1505   BASOSABS 0.0 08/18/2016 1819   BASOSABS 0.0 06/21/2013 1505    No results found for: POCLITH, LITHIUM   No results found for: PHENYTOIN, PHENOBARB, VALPROATE, CBMZ   .res Assessment: Plan:   Discussed treatment options to include increasing BuSpar to improve anxiety since patient was previously on 30 mg twice daily.  Patient agrees to increase BuSpar.  We will increase BuSpar to 15 mg 1-1/2 tablets twice daily for 1 week, then increase to 30 mg twice daily for anxiety. Discussed restarting doxazosin since it appears that this was helpful for nightmares in discussed that doxazosin is also used to treat blood pressure and may also help lower BP.  Will continue Trintellix 20 mg daily for depression.  Discussed that Trintellix typically is more effective for depressive signs and symptoms and may not be as effective for anxiety compared to other medications, and if anxiety does not improve with increase in BuSpar, may wish to consider switching Trintellix to another medication in the future such as Prozac or Viibryd.  Briefly discussed potential benefits, risks, and side effects of Prozac and Viibryd. Recommend continuing psychotherapy with Lina Sayre, [facility]. Patient to follow-up in 4 weeks or sooner if clinically indicated. Patient advised to contact office with any questions, adverse effects, or acute worsening in signs and symptoms.  Shiva was seen today for anxiety, depression and sleeping problem.  Diagnoses and all orders for this visit:  Nightmares -     doxazosin (CARDURA) 4 MG tablet; Take 1/2 tab po QHS x 4 days, then may increase to 1 tab po QHS  PTSD (post-traumatic stress disorder) -  doxazosin (CARDURA) 4 MG tablet; Take 1/2 tab po QHS x 4 days, then may increase to 1 tab po QHS -     busPIRone (BUSPAR) 30 MG tablet; Take 1 tablet (30 mg total) by mouth 2 (two) times daily. -      vortioxetine HBr (TRINTELLIX) 20 MG TABS tablet; Take 1 tablet (20 mg total) by mouth daily.  MDD (major depressive disorder), recurrent episode, mild (HCC) -     vortioxetine HBr (TRINTELLIX) 20 MG TABS tablet; Take 1 tablet (20 mg total) by mouth daily.     Please see After Visit Summary for patient specific instructions.  Future Appointments  Date Time Provider [place]  04/30/2020  8:00 AM Lina Sayre, [facility] None  05/21/2020 10:30 AM Thayer Headings, PMHNP CP-CP None  06/02/2020  9:00 AM Marrian Salvage, FNP [facility] None  06/30/2020  8:00 AM Lina Sayre, [facility] CP-CP None    No orders of the defined types were placed in this encounter.   -------------------------------

## 2020-04-30 ENCOUNTER — Ambulatory Visit: Payer: BC Managed Care – PPO | Admitting: Psychiatry

## 2020-04-30 ENCOUNTER — Emergency Department (HOSPITAL_COMMUNITY)
Admission: EM | Admit: 2020-04-30 | Discharge: 2020-04-30 | Disposition: A | Discharge status: Home / Self Care | Payer: BC Managed Care – PPO | Attending: Emergency Medicine | Admitting: Emergency Medicine

## 2020-04-30 ENCOUNTER — Encounter (HOSPITAL_COMMUNITY): Payer: Self-pay | Admitting: Emergency Medicine

## 2020-04-30 ENCOUNTER — Emergency Department (HOSPITAL_COMMUNITY): Payer: BC Managed Care – PPO

## 2020-04-30 DIAGNOSIS — Z853 Personal history of malignant neoplasm of breast: Secondary | ICD-10-CM | POA: Diagnosis not present

## 2020-04-30 DIAGNOSIS — H538 Other visual disturbances: Secondary | ICD-10-CM | POA: Diagnosis not present

## 2020-04-30 DIAGNOSIS — R002 Palpitations: Secondary | ICD-10-CM | POA: Insufficient documentation

## 2020-04-30 DIAGNOSIS — I10 Essential (primary) hypertension: Secondary | ICD-10-CM | POA: Diagnosis not present

## 2020-04-30 DIAGNOSIS — M2578 Osteophyte, vertebrae: Secondary | ICD-10-CM | POA: Diagnosis not present

## 2020-04-30 DIAGNOSIS — Z85841 Personal history of malignant neoplasm of brain: Secondary | ICD-10-CM | POA: Diagnosis not present

## 2020-04-30 DIAGNOSIS — R0789 Other chest pain: Secondary | ICD-10-CM | POA: Diagnosis not present

## 2020-04-30 DIAGNOSIS — R55 Syncope and collapse: Secondary | ICD-10-CM

## 2020-04-30 DIAGNOSIS — R079 Chest pain, unspecified: Secondary | ICD-10-CM | POA: Insufficient documentation

## 2020-04-30 DIAGNOSIS — R0602 Shortness of breath: Secondary | ICD-10-CM | POA: Diagnosis not present

## 2020-04-30 DIAGNOSIS — Z79899 Other long term (current) drug therapy: Secondary | ICD-10-CM | POA: Diagnosis not present

## 2020-04-30 DIAGNOSIS — G9389 Other specified disorders of brain: Secondary | ICD-10-CM | POA: Diagnosis not present

## 2020-04-30 DIAGNOSIS — W19XXXA Unspecified fall, initial encounter: Secondary | ICD-10-CM | POA: Diagnosis not present

## 2020-04-30 DIAGNOSIS — Z87891 Personal history of nicotine dependence: Secondary | ICD-10-CM | POA: Insufficient documentation

## 2020-04-30 DIAGNOSIS — M47816 Spondylosis without myelopathy or radiculopathy, lumbar region: Secondary | ICD-10-CM | POA: Diagnosis not present

## 2020-04-30 DIAGNOSIS — Z043 Encounter for examination and observation following other accident: Secondary | ICD-10-CM | POA: Diagnosis not present

## 2020-04-30 LAB — CBC WITH DIFFERENTIAL/PLATELET
Basophils Absolute: 0 10*3/uL (ref 0.0–0.1)
Basophils Relative: 0 %
Eosinophils Absolute: 0.1 10*3/uL (ref 0.0–0.5)
Eosinophils Relative: 1 %
HCT: 40.2 % (ref 39.0–52.0)
Hemoglobin: 13.5 g/dL (ref 13.0–17.0)
Lymphocytes Relative: 30 %
Lymphs Abs: 1.4 10*3/uL (ref 0.7–4.0)
MCH: 33 pg (ref 26.0–34.0)
MCHC: 33.6 g/dL (ref 30.0–36.0)
MCV: 98.3 fL (ref 80.0–100.0)
Monocytes Absolute: 0.5 10*3/uL (ref 0.1–1.0)
Monocytes Relative: 10 %
Neutro Abs: 2.7 10*3/uL (ref 1.7–7.7)
Neutrophils Relative %: 59 %
Platelets: 164 10*3/uL (ref 150–400)
RBC: 4.09 MIL/uL — ABNORMAL LOW (ref 4.22–5.81)
RDW: 12.3 % (ref 11.5–15.5)
WBC: 4.7 10*3/uL (ref 4.0–10.5)

## 2020-04-30 LAB — COMPREHENSIVE METABOLIC PANEL
ALT: 20 U/L (ref 0–44)
AST: 34 U/L (ref 15–41)
Albumin: 4.1 g/dL (ref 3.5–5.0)
Alkaline Phosphatase: 41 U/L (ref 38–126)
Anion gap: 9 (ref 5–15)
BUN: 13 mg/dL (ref 6–20)
CO2: 23 mmol/L (ref 22–32)
Calcium: 9 mg/dL (ref 8.9–10.3)
Chloride: 103 mmol/L (ref 98–111)
Creatinine, Ser: 0.94 mg/dL (ref 0.61–1.24)
GFR calc Af Amer: >60 mL/min (ref >60)
GFR calc non Af Amer: >60 mL/min (ref >60)
Glucose, Bld: 98 mg/dL (ref 70–99)
Potassium: 4.6 mmol/L (ref 3.5–5.1)
Sodium: 135 mmol/L (ref 135–145)
Total Bilirubin: 1.2 mg/dL (ref 0.3–1.2)
Total Protein: 6.6 g/dL (ref 6.5–8.1)

## 2020-04-30 LAB — CBG MONITORING, ED: Glucose-Capillary: 92 mg/dL (ref 70–99)

## 2020-04-30 LAB — TROPONIN I (HIGH SENSITIVITY): Troponin I (High Sensitivity): 3 ng/L (ref <18)

## 2020-04-30 NOTE — ED Provider Notes (Addendum)
[facility] EMERGENCY DEPARTMENT Provider Note   CSN: [id number] Arrival date & time: 04/30/20  1509     History Chief Complaint  Patient presents with  . Palpitations  . Loss of Consciousness    Shawn York is a 44 y.o. male with a past medical history of brain tumor status post resection about 10 years ago, seizures currently on oxcarbazepine, hypertension presenting to the ED with a chief complaint of syncope.  States that he drinks alcohol daily, about 6-7 beers.  Was drinking last night so he is unsure if he took his antihypertensive or seizure medication.  He woke up this morning around 6:30 AM, went to brush his teeth when all of a sudden he had palpitations, lightheadedness and "tunnel vision."  He syncopized after this for less than 1 minute.  States that his wife heard him fall to the ground.  He denies any head injury.  States that he regained consciousness without intervention.  He had intermittent palpitations and chest pain since then.  He also reports left ankle pain since the fall.  Has been having lower back pain as well.  He denies any current chest pain.  Denies any headache, blurry vision.  Reports baseline paresthesias of his left arm that is unchanged.  Denies any weakness, numbness, changes to gait, shortness of breath, hemoptysis, leg swelling.  HPI     Past Medical History:  Diagnosis Date  . 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 Hypertension   . PTSD (post-traumatic stress disorder)   . Seizures [facility])     Patient Active Problem List   Diagnosis Date Noted  . PTSD (post-traumatic stress disorder) 06/05/2018  . Nightmares associated with chronic post-traumatic stress disorder 09/27/2017  . Brain tumor, glioma ([place]) 09/27/2017  . Severe episode of recurrent major depressive disorder, without psychotic features ([place])   . Major depressive disorder, recurrent episode ([place]) 08/17/2016  . Hypertension 06/21/2013   . 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 BASAL CELL CARCINOMA EXCISION  1995  . CRANIOTOMY FOR TUMOR Right 02/25/2011  . WISDOM TOOTH EXTRACTION         Family History  Problem Relation Age of Onset  . Cancer Other        brain cancer  . Cancer Father        skin cancer  . Cancer Maternal Grandmother 43       brain cancer  . Alcohol abuse Brother   . Alzheimer's disease Paternal Grandmother   . Dementia Paternal Grandmother   . Heart disease Paternal Grandfather     Social History   Tobacco Use  . Smoking status: Former Smoker    Quit date: 02/25/2011    Years since quitting: 9.1  . Smokeless tobacco: Never Used  Substance Use Topics  . Alcohol use: Yes    Alcohol/week: 14.0 standard drinks    Types: 14 Cans of beer per week  . Drug use: No    Home Medications Prior to Admission medications   Medication Sig Start Date End Date Taking? Authorizing Provider  busPIRone (BUSPAR) 30 MG tablet Take 1 tablet (30 mg total) by mouth 2 (two) times daily. Patient taking differently: Take 15 mg by mouth 2 (two) times daily.  04/24/20 05/24/20 Yes Thayer Headings, PMHNP  cetirizine-pseudoephedrine (ZYRTEC-D) 5-120 MG tablet Take 1 tablet by mouth 2 (two) times daily as needed for allergies or  rhinitis.    Yes [provider]  doxazosin (CARDURA) 4 MG tablet Take 1/2 tab po QHS x 4 days, then may increase to 1 tab po QHS Patient taking differently: Take 4 mg by mouth at bedtime.  04/24/20  Yes Thayer Headings, PMHNP  EPINEPHrine 0.3 mg/0.3 mL IJ SOAJ injection Inject 0.3 mg into the muscle once as needed (for a severe, allergic reaction).    Yes [provider]  Oxcarbazepine (TRILEPTAL) 300 MG tablet Take 1 tablet (300 mg total) by mouth 2 (two) times daily. 08/21/16  Yes Derrill Center, NP  vortioxetine HBr (TRINTELLIX) 20 MG TABS tablet Take 1 tablet (20 mg total) by mouth  daily. Patient taking differently: Take 20 mg by mouth at bedtime.  04/24/20 05/24/20 Yes Thayer Headings, PMHNP  benazepril (LOTENSIN) 20 MG tablet Take 20 mg by mouth at bedtime.     [provider]    Allergies    Contrast media [iodinated diagnostic agents], Penicillins, and Shellfish allergy  Review of Systems   Review of Systems  Constitutional: Negative for appetite change, chills and fever.  HENT: Negative for ear pain, rhinorrhea, sneezing and sore throat.   Eyes: Negative for photophobia and visual disturbance.  Respiratory: Negative for cough, chest tightness, shortness of breath and wheezing.   Cardiovascular: Positive for chest pain and palpitations.  Gastrointestinal: Negative for abdominal pain, blood in stool, constipation, diarrhea, nausea and vomiting.  Genitourinary: Negative for dysuria, hematuria and urgency.  Musculoskeletal: Negative for myalgias.  Skin: Negative for rash.  Neurological: Positive for syncope. Negative for dizziness, weakness and light-headedness.    Physical Exam Updated Vital Signs BP (!) 145/102   Pulse 65   Temp 98.5 F (36.9 C)   Resp 11   SpO2 99%   Physical Exam Vitals and nursing note reviewed.  Constitutional:      General: He is not in acute distress.    Appearance: He is well-developed.  HENT:     Head: Normocephalic and atraumatic.     Nose: Nose normal.  Eyes:     General: No scleral icterus.       Right eye: No discharge.        Left eye: No discharge.     Conjunctiva/sclera: Conjunctivae normal.     Pupils: Pupils are equal, round, and reactive to light.  Cardiovascular:     Rate and Rhythm: Normal rate and regular rhythm.     Heart sounds: Normal heart sounds. No murmur heard.  No friction rub. No gallop.   Pulmonary:     Effort: Pulmonary effort is normal. No respiratory distress.     Breath sounds: Normal breath sounds.  Abdominal:     General: Bowel sounds are normal. There is no distension.      Palpations: Abdomen is soft.     Tenderness: There is no abdominal tenderness. There is no guarding.  Musculoskeletal:        General: Tenderness present. Normal range of motion.     Cervical back: Normal range of motion and neck supple.     Comments: Left medial ankle tenderness without deformity or changes to range of motion. 2+ DP pulses noted bilaterally.  Skin:    General: Skin is warm and dry.     Findings: No rash.  Neurological:     General: No focal deficit present.     Mental Status: He is alert and oriented to person, place, and time.     Cranial Nerves: No cranial  nerve deficit.     Motor: No abnormal muscle tone.     Coordination: Coordination normal.     Comments: Pupils reactive. No facial asymmetry noted. Cranial nerves appear grossly intact. Sensation intact to light touch on face, BUE and BLE. Strength 5/5 in BUE and BLE.      ED Results / Procedures / Treatments   Labs (all labs ordered are listed, but only abnormal results are displayed) Labs Reviewed  CBC WITH DIFFERENTIAL/PLATELET - Abnormal; Notable for the following components:      Result Value   RBC 4.09 (*)    All other components within normal limits  COMPREHENSIVE METABOLIC PANEL  CBG MONITORING, ED  TROPONIN I (HIGH SENSITIVITY)    EKG EKG Interpretation  Date/Time:  Thursday April 30 2020 15:17:13 EDT Ventricular Rate:  68 PR Interval:    QRS Duration: 96 QT Interval:  402 QTC Calculation: 428 R Axis:   48 Text Interpretation: Sinus rhythm normal, no old Confirmed by Charlesetta Shanks [id number]) on 04/30/2020 3:18:40 PM   Radiology DG Chest 2 View  Result Date: 04/30/2020 CLINICAL DATA:  Syncope. EXAM: CHEST - 2 VIEW COMPARISON:  None. FINDINGS: Multiple radiopaque overlying cardiac lead wires are seen. There is no evidence of acute infiltrate, pleural effusion or pneumothorax. The heart size and mediastinal contours are within normal limits. The visualized skeletal structures are  unremarkable. IMPRESSION: No active cardiopulmonary disease. Electronically Signed   By: Virgina Norfolk M.D.   On: 04/30/2020 16:36   DG Lumbar Spine Complete  Result Date: 04/30/2020 CLINICAL DATA:  Syncope and subsequent fall. EXAM: LUMBAR SPINE - COMPLETE 4+ VIEW COMPARISON:  None. FINDINGS: There is no evidence of acute lumbar spine fracture. Alignment is normal. Mild multilevel endplate sclerosis is seen throughout the lumbar spine with mild anterior osteophyte formation noted at the level of L3-L4. Mild multilevel intervertebral disc space narrowing is present. IMPRESSION: Mild multilevel degenerative changes. Electronically Signed   By: Virgina Norfolk M.D.   On: 04/30/2020 16:43   DG Ankle Complete Left  Result Date: 04/30/2020 CLINICAL DATA:  Syncope and subsequent fall. EXAM: LEFT ANKLE COMPLETE - 3+ VIEW COMPARISON:  None. FINDINGS: There is no evidence of an acute fracture, dislocation, or joint effusion. There is no evidence of arthropathy or other focal bone abnormality. Soft tissues are unremarkable. IMPRESSION: Negative. Electronically Signed   By: Virgina Norfolk M.D.   On: 04/30/2020 16:37   CT HEAD WO CONTRAST  Result Date: 04/30/2020 CLINICAL DATA:  Syncope. History of a craniotomy for anaplastic oligodendroglioma. EXAM: CT HEAD WITHOUT CONTRAST TECHNIQUE: Contiguous axial images were obtained from the base of the skull through the vertex without intravenous contrast. COMPARISON:  Brain MRI, 01/28/2019. FINDINGS: Brain: No evidence of acute infarction, hemorrhage, hydrocephalus, extra-axial collection or mass lesion/mass effect. Right temporal lobe encephalomalacia reflects the previous tumor excision, stable from prior MRI. Vascular: No hyperdense vessel or unexpected calcification. Skull: Previous right frontoparietal temporal craniotomy. No fracture or skull lesion. Sinuses/Orbits: Normal globes and orbits. Visualized sinuses are clear. Other: None. IMPRESSION: 1. No acute  intracranial abnormalities. 2. Stable changes from the previous right temporal lobe tumor resection. Electronically Signed   By: Lajean Manes M.D.   On: 04/30/2020 16:00    Procedures Procedures (including critical care time)  Medications Ordered in ED Medications - No data to display  ED Course  I have reviewed the triage vital signs and the nursing notes.  Pertinent labs & imaging results that were available during my care  of the patient were reviewed by me and considered in my medical decision making (see chart for details).  Clinical Course as of May 01 2031  Thu Apr 30, 2020  1747 Hgb 13.9   [HK]    Clinical Course User Index [HK] Delia Heady, [place]   MDM Rules/Calculators/A&P                          44 year old male with past medical history of brain tumor status post resection about 10 years ago, seizures on oxcarbazepine, hypertension presenting to the ED with a chief complaint of syncope.  Reports daily alcohol use, about 6-7 beers daily.  Was drinking last night so he is unsure if he took his antihypertensive or seizure medication.  Woke up this morning around 6:30 AM, went to brush his teeth when he felt like he had palpitations, lightheadedness and "tunnel vision."  He syncopized for less than a minute although this was unwitnessed.  She denies any head injury.  He has had left-sided ankle pain, lower back pain since then.  He did have some chest pain palpitations at the time.  Denies any headache or blurry vision.  No anticoagulant use.  Reports baseline paresthesias on his left arm that has been unchanged since the syncopal episode.  On exam there is no numbness or weakness noted of bilateral upper and lower extremities.  No facial asymmetry noted.  There is diffuse tenderness of the left ankle with pain with range of motion.  Equal intact distal pulses of bilateral upper and lower extremities.  He sees [facility], last MRI of the brain was done on 04/15/2020 which showed  essentially unchanged findings.  Lab work ordered and interpreted, CBC, CMP unremarkable.  Troponin is negative x1.  EKG shows normal sinus rhythm, no ischemic changes. No arrhythmia, no tachycardia.  His PERC negative.  CT of the head shows no acute findings, there are stable changes from his prior resection. X-ray of the lumbar spine, left ankle and chest are unremarkable. Patient without any neuro deficits. He remains overall well here. Suspect possible dehydration in the setting of alcohol use vs vasovagal. No evidence of intracranial, cardiac or pulmonary cause of symptoms. He is comfortable with discharge home with PCP and neurology follow-up. Patient discussed with and seen by the attending, Dr. Sedonia Small.   Patient is hemodynamically stable, in NAD, and able to ambulate in the ED. Evaluation does not show pathology that would require ongoing emergent intervention or inpatient treatment. I explained the diagnosis to the patient. Pain has been managed and has no complaints prior to discharge. Patient is comfortable with above plan and is stable for discharge at this time. All questions were answered prior to disposition. Strict return precautions for returning to the ED were discussed. Encouraged follow up with PCP.   An After Visit Summary was printed and given to the patient.   Portions of this note were generated with [employer]. Dictation errors may occur despite best attempts at proofreading.  Final Clinical Impression(s) / ED Diagnoses Final diagnoses:  Syncope, unspecified syncope type    Rx / DC Orders ED Discharge Orders    None         Delia Heady, PA-C 04/30/20 2036    Maudie Flakes, MD 05/01/20 [phone]

## 2020-04-30 NOTE — ED Notes (Signed)
Patient is resting comfortably. Pt and family updated with plan of care.

## 2020-04-30 NOTE — ED Notes (Signed)
Discharge teaching reviewed with pt. Pt verbalized understanding. 

## 2020-04-30 NOTE — ED Notes (Signed)
CBG- 92 

## 2020-04-30 NOTE — Discharge Instructions (Addendum)
Follow-up with your PCP and neurologist. Continue home medications as previously prescribed. Return to the ER for worsening syncope, chest pain, headache, blurry vision, numbness in arms or legs, shortness of breath.

## 2020-04-30 NOTE — ED Triage Notes (Signed)
Pt had syncopal episode at 6:30am , positive LOC. Pt c/o pain in left ankle pain. Pt has  h/o brain tumor and seizures. Pt was seen at urgent care today, pt had c/o palpitations.

## 2020-05-05 DIAGNOSIS — R2232 Localized swelling, mass and lump, left upper limb: Secondary | ICD-10-CM | POA: Diagnosis not present

## 2020-05-08 ENCOUNTER — Other Ambulatory Visit: Payer: Self-pay | Admitting: Surgery

## 2020-05-08 DIAGNOSIS — M792 Neuralgia and neuritis, unspecified: Secondary | ICD-10-CM

## 2020-05-08 DIAGNOSIS — L989 Disorder of the skin and subcutaneous tissue, unspecified: Secondary | ICD-10-CM

## 2020-05-21 ENCOUNTER — Ambulatory Visit: Payer: BC Managed Care – PPO | Admitting: Psychiatry

## 2020-05-22 ENCOUNTER — Ambulatory Visit: Payer: BC Managed Care – PPO | Admitting: Psychiatry

## 2020-05-22 ENCOUNTER — Other Ambulatory Visit: Payer: Self-pay

## 2020-05-22 ENCOUNTER — Ambulatory Visit (INDEPENDENT_AMBULATORY_CARE_PROVIDER_SITE_OTHER): Payer: BC Managed Care – PPO | Admitting: Psychiatry

## 2020-05-22 DIAGNOSIS — F4310 Post-traumatic stress disorder, unspecified: Secondary | ICD-10-CM | POA: Diagnosis not present

## 2020-05-22 NOTE — Progress Notes (Signed)
[facility] Counselor/Therapist Progress Note  Patient ID: Shawn York, MRN: [id number],    Date: 05/22/2020  Time Spent: 50 minutes start time 9:08 AM end time 9:58 AM  Treatment Type: Individual Therapy  Reported Symptoms: Anxiety, triggered responses, irritability, memory issues,, focusing issues  Mental Status Exam:  Appearance:   Casual and Neat     Behavior:  Appropriate  Motor:  Normal  Speech/Language:   Normal Rate  Affect:  Appropriate  Mood:  normal  Thought process:  normal  Thought content:    WNL  Sensory/Perceptual disturbances:    WNL  Orientation:  oriented to person, place, time/date and situation  Attention:  Good  Concentration:  Good  Memory:  WNL  Fund of knowledge:   Good  Insight:    Good  Judgment:   Good  Impulse Control:  Good   Risk Assessment: Danger to Self:  No Self-injurious Behavior: No Danger to Others: No Duty to Warn:no Physical Aggression / Violence:No  Access to Firearms a concern: No  Gang Involvement:No   Subjective: Patient was present for session.  He shared that he is doing okay overall.  There have been some issues with his wife she decided that she will go to her own therapist and they may go to couples therapy.  Patient went on to share that he had been drinking in the afternoons and evenings and realized that it had potential to be a problem so he is stopped at this point.  Discussed reasons that may have been occurring and what the benefit from drinking had been for him.  Patient was finally able to recognize that part of the reason may have been because of the sound in the house as he is trying to fix dinner and take care of the children.  When his daughter gets to a very high pitched time it is very difficult for him with his tinnitus and he probably was drinking some to decrease the anxiety triggered by the sound.  Patient was given a short Sensory Profile to fill out since he is concerned that he could have a sensory  processing disorder and is also seeing some concerns with his son.  Encouraged patient to talk to his wife about what he is noticing and for them to fill out the profiles on him and his son together.  Patient also acknowledged that when his wife came home and would question what he had accomplished during the day he instantly felt very triggered and that he was not enough even though he had worked a full day taking care of the children was fixing dinner and typically had done projects outside for around the house.  Discussed the fact that she may have had certain projects in her head that she wanted him to accomplish and so it was harder for her to see what he had done.  Discussed ways for him to communicate his concerns with his wife appropriately so that it does not build up and come out in irritability.  Patient also discussed how he had gotten very triggered with the third car situation and his wife not hearing what he needed.  Encouraged him to communicate more detail about the situation with his wife rather than holding in that and letting it come out and another time.  Patient reported feeling good at the end of session and agreed to follow through with plans.  Interventions: Solution-Oriented/Positive Psychology  Diagnosis:   ICD-10-CM   1. PTSD (post-traumatic  stress disorder)  F43.10     Plan: Patient is to use CBT and coping skills to decrease anger responses.  Patient is to fill out information on Sensory Profile for himself and his son to see if there may be a sensory processing disorder.  Patient is to talk to his wife about different strategies for communication and problem-solving to help deal with triggering each other. Long-term goal: Interact normally with friends and family without irrational fears or intrusive thoughts that control behavior Short-term goal: Verbalize hopeful and positive statements regarding future  Lina Sayre, [facility]

## 2020-05-29 ENCOUNTER — Ambulatory Visit
Admission: RE | Admit: 2020-05-29 | Discharge: 2020-05-29 | Disposition: A | Discharge status: Home / Self Care | Payer: BC Managed Care – PPO | Source: Ambulatory Visit | Attending: Surgery | Admitting: Surgery

## 2020-05-29 DIAGNOSIS — L989 Disorder of the skin and subcutaneous tissue, unspecified: Secondary | ICD-10-CM

## 2020-05-29 DIAGNOSIS — M792 Neuralgia and neuritis, unspecified: Secondary | ICD-10-CM

## 2020-05-29 DIAGNOSIS — R2232 Localized swelling, mass and lump, left upper limb: Secondary | ICD-10-CM | POA: Diagnosis not present

## 2020-05-29 MED ORDER — GADOBENATE DIMEGLUMINE 529 MG/ML IV SOLN
20.0000 mL | Freq: Once | INTRAVENOUS | Status: AC | PRN
  Administered 2020-05-29: 15 mL via INTRAVENOUS

## 2020-06-02 ENCOUNTER — Encounter: Payer: Self-pay | Admitting: Family

## 2020-06-02 ENCOUNTER — Ambulatory Visit (INDEPENDENT_AMBULATORY_CARE_PROVIDER_SITE_OTHER): Payer: BC Managed Care – PPO | Admitting: Family

## 2020-06-02 ENCOUNTER — Other Ambulatory Visit: Payer: Self-pay

## 2020-06-02 VITALS — BP 126/82 | HR 71 | Temp 98.5°F | Ht 73.0 in | Wt 193.0 lb

## 2020-06-02 DIAGNOSIS — C712 Malignant neoplasm of temporal lobe: Secondary | ICD-10-CM | POA: Diagnosis not present

## 2020-06-02 DIAGNOSIS — I10 Essential (primary) hypertension: Secondary | ICD-10-CM | POA: Diagnosis not present

## 2020-06-02 DIAGNOSIS — Z23 Encounter for immunization: Secondary | ICD-10-CM

## 2020-06-02 NOTE — Patient Instructions (Signed)
Please let us know the dosage of your blood pressure medication; please let us know the date of your last Tdap

## 2020-06-02 NOTE — Progress Notes (Signed)
Shawn York is a 44 y.o. male with the following history as recorded in Epic[facility]:  Patient Active Problem List   Diagnosis Date Noted  . PTSD (post-traumatic stress disorder) 06/05/2018  . Nightmares associated with chronic post-traumatic stress disorder 09/27/2017  . Brain tumor, glioma ([place]) 09/27/2017  . Severe episode of recurrent major depressive disorder, without psychotic features ([place])   . Major depressive disorder, recurrent episode ([place]) 08/17/2016  . Hypertension 06/21/2013  . 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 benazepril (LOTENSIN) 10 MG tablet Take 10 mg by mouth at bedtime.     . benazepril (LOTENSIN) 20 MG tablet Take 20 mg by mouth at bedtime.     . cetirizine-pseudoephedrine (ZYRTEC-D) 5-120 MG tablet Take 1 tablet by mouth 2 (two) times daily as needed for allergies or rhinitis.     Marland Kitchen doxazosin (CARDURA) 4 MG tablet Take 1/2 tab po QHS x 4 days, then may increase to 1 tab po QHS (Patient taking differently: Take 4 mg by mouth at bedtime. ) 30 tablet 1  . EPINEPHrine 0.3 mg/0.3 mL IJ SOAJ injection Inject 0.3 mg into the muscle once as needed (for a severe, allergic reaction).     . Oxcarbazepine (TRILEPTAL) 300 MG tablet Take 1 tablet (300 mg total) by mouth 2 (two) times daily. 60 tablet 0  . busPIRone (BUSPAR) 30 MG tablet Take 1 tablet (30 mg total) by mouth 2 (two) times daily. (Patient taking differently: Take 15 mg by mouth 2 (two) times daily. ) 60 tablet 1  . vortioxetine HBr (TRINTELLIX) 20 MG TABS tablet Take 1 tablet (20 mg total) by mouth daily. (Patient taking differently: Take 20 mg by mouth at bedtime. ) 30 tablet 1   No current facility-administered medications for this visit.    Allergies: Iodinated diagnostic agents, Penicillins, Shellfish allergy, and Shellfish-derived products  Past Medical History:  Diagnosis Date  . Brain cancer ([place]) 02/25/2011   grade  III anaplastic ologodendrglioma  . Hypertension   . PTSD (post-traumatic stress disorder)   . Seizures ([place])     Past Surgical History:  Procedure Laterality Date  . BASAL CELL CARCINOMA EXCISION  1995  . CRANIOTOMY FOR TUMOR Right 02/25/2011  . WISDOM TOOTH EXTRACTION      Family History  Problem Relation Age of Onset  . Cancer Other        brain cancer  . Cancer Father        skin cancer  . Cancer Maternal Grandmother 42       brain cancer  . Alcohol abuse Brother   . Alzheimer's disease Paternal Grandmother   . Dementia Paternal Grandmother   . Heart disease Paternal Grandfather     Social History   Tobacco Use  . Smoking status: Former Smoker    Quit date: 02/25/2011    Years since quitting: 9.2  . Smokeless tobacco: Never Used  Substance Use Topics  . Alcohol use: Yes    Alcohol/week: 14.0 standard drinks    Types: 14 Cans of beer per week    Subjective:   Patient presents today as a new patient; history of hypertension- unsure of dosage of Lotensin; does have concerns that his blood pressure is not well controlled; History of seizures/ brain tumor in 2012; goes to [facility] yearly for scans/ follow-up Sees psychiatrist every month for PTSD management;  Would like to get flu shot today;     Objective:  Vitals:   06/02/20 0923  BP: 126/82  Pulse: 71  Temp: 98.5 F (36.9 C)  TempSrc: Oral  SpO2: 98%  Weight: 193 lb (87.5 kg)  Height: 6\' 1"  (1.854 m)    General: Well developed, well nourished, in no acute distress  Head: Normocephalic and atraumatic  Eyes: Sclera and conjunctiva clear; pupils round and reactive to light; extraocular movements intact  Ears: External normal; canals clear; tympanic membranes normal  Oropharynx: Pink, supple. No suspicious lesions  Neck: Supple without thyromegaly, adenopathy  Lungs: Respirations unlabored; clear to auscultation bilaterally without wheeze, rales, rhonchi  CVS exam: normal rate and regular rhythm.  Vessels:  Symmetric bilaterally  Neurologic: Alert and oriented; speech intact; face symmetrical; moves all extremities well; CNII-XII intact without focal deficit   Assessment:  1. Essential hypertension   2. 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 Needs flu shot     Plan:  1. ? Control; patient is unsure of dosage of medication and will need to call back with that information; he will start checking his pressure 2x per day for the next 2 weeks and forward results for review; will adjust medication accordingly; 2. Continue with neurosurgeon at [facility] as scheduled; continue with psychiatrist as well; 3. Flu shot given;  This visit occurred during the SARS-CoV-2 public health emergency.  Safety protocols were in place, including screening questions prior to the visit, additional usage of staff PPE, and extensive cleaning of exam room while observing appropriate contact time as indicated for disinfecting solutions.     No follow-ups on file.  Orders Placed This Encounter  Procedures  . Flu Vaccine QUAD 36+ mos IM    Requested Prescriptions    No prescriptions requested or ordered in this encounter

## 2020-06-03 ENCOUNTER — Other Ambulatory Visit: Payer: Self-pay | Admitting: Family

## 2020-06-04 ENCOUNTER — Other Ambulatory Visit: Payer: Self-pay | Admitting: Surgery

## 2020-06-04 DIAGNOSIS — M7989 Other specified soft tissue disorders: Secondary | ICD-10-CM

## 2020-06-10 ENCOUNTER — Other Ambulatory Visit: Payer: Self-pay

## 2020-06-10 ENCOUNTER — Ambulatory Visit
Admission: RE | Admit: 2020-06-10 | Discharge: 2020-06-10 | Disposition: A | Discharge status: Home / Self Care | Payer: BC Managed Care – PPO | Source: Ambulatory Visit | Attending: Surgery | Admitting: Surgery

## 2020-06-10 DIAGNOSIS — R2242 Localized swelling, mass and lump, left lower limb: Secondary | ICD-10-CM | POA: Diagnosis not present

## 2020-06-10 DIAGNOSIS — R2232 Localized swelling, mass and lump, left upper limb: Secondary | ICD-10-CM | POA: Diagnosis not present

## 2020-06-10 DIAGNOSIS — M7989 Other specified soft tissue disorders: Secondary | ICD-10-CM

## 2020-06-10 MED ORDER — GADOBENATE DIMEGLUMINE 529 MG/ML IV SOLN
20.0000 mL | Freq: Once | INTRAVENOUS | Status: AC | PRN
  Administered 2020-06-10: 20 mL via INTRAVENOUS

## 2020-06-12 ENCOUNTER — Other Ambulatory Visit: Payer: Self-pay

## 2020-06-12 ENCOUNTER — Ambulatory Visit (INDEPENDENT_AMBULATORY_CARE_PROVIDER_SITE_OTHER): Payer: BC Managed Care – PPO | Admitting: Psychiatry

## 2020-06-12 ENCOUNTER — Encounter: Payer: Self-pay | Admitting: Psychiatry

## 2020-06-12 VITALS — BP 176/114 | HR 64

## 2020-06-12 DIAGNOSIS — F330 Major depressive disorder, recurrent, mild: Secondary | ICD-10-CM

## 2020-06-12 DIAGNOSIS — F4310 Post-traumatic stress disorder, unspecified: Secondary | ICD-10-CM | POA: Diagnosis not present

## 2020-06-12 DIAGNOSIS — F515 Nightmare disorder: Secondary | ICD-10-CM

## 2020-06-12 MED ORDER — BUSPIRONE HCL 30 MG PO TABS: 30.0000 mg | ORAL_TABLET | Freq: Two times a day (BID) | ORAL | 1 refills | Status: DC

## 2020-06-12 MED ORDER — DOXAZOSIN MESYLATE 4 MG PO TABS: 4.0000 mg | ORAL_TABLET | Freq: Every day | ORAL | 0 refills | Status: DC

## 2020-06-12 NOTE — Progress Notes (Signed)
Shawn York [id number] April 20, 1976 44 y.o.  Subjective:   Patient ID:  Shawn York is a 44 y.o. (DOB 1976-07-09) male.  Chief Complaint:  Chief Complaint  Patient presents with   Anxiety   Depression   Sleeping Problem    HPI Shawn York presents to the office today for follow-up of anxiety, depression, and sleep disturbance. He reports that he has had some medical issues and this is causing some anxiety. He reports that he has had lengthy work-up. Continues to have PTSD s/s and is not sure if Buspar has been helpful for his anxiety. "I'm doing ok these days." He reports that he continues to have "weird" nightmares and "they are not as terrorizing." Sleeping about 6-7 hours a night. No change in irritability or depression. Energy and motivation have been ok. He reports that he will intentionally have to take breaths and be more present. Occasional difficulty with concentration. He reports that it takes him a long time to do things. He reports that suicidal thoughts have been less. Denies current SI.   He reports that things have been going well with he and his wife.  Past Psychiatric Medication Trials: Trintellix Rexulti Trileptal Buspar Remeron Zoloft- Sexual side effects Hydroxyzine- Drowsiness Doxazosin  PHQ2-9     Office Visit from 06/02/2020 in [place] at [facility] Total Score 0  PHQ-9 Total Score 0       Review of Systems:  Review of Systems  Musculoskeletal: Negative for gait problem.  Skin:       Skin change on face.  Neurological: Positive for syncope and numbness. Negative for dizziness, tremors and light-headedness.  Psychiatric/Behavioral:       Please refer to HPI  He reports syncopal episode after working outside with minimal hydration and then drinking ETOH when friends came over. The next morning he stood up quickly and passed out. He reports that BP has been high.   Medications: I have reviewed the patient's current  medications.  Current Outpatient Medications  Medication Sig Dispense Refill   benazepril (LOTENSIN) 10 MG tablet Take 10 mg by mouth at bedtime.      cetirizine-pseudoephedrine (ZYRTEC-D) 5-120 MG tablet Take 1 tablet by mouth 2 (two) times daily as needed for allergies or rhinitis.      doxazosin (CARDURA) 4 MG tablet Take 1 tablet (4 mg total) by mouth at bedtime. 90 tablet 0   Oxcarbazepine (TRILEPTAL) 300 MG tablet Take 1 tablet (300 mg total) by mouth 2 (two) times daily. 60 tablet 0   busPIRone (BUSPAR) 30 MG tablet Take 1 tablet (30 mg total) by mouth 2 (two) times daily. 60 tablet 1   EPINEPHrine 0.3 mg/0.3 mL IJ SOAJ injection Inject 0.3 mg into the muscle once as needed (for a severe, allergic reaction).      vortioxetine HBr (TRINTELLIX) 20 MG TABS tablet Take 1 tablet (20 mg total) by mouth daily. (Patient taking differently: Take 20 mg by mouth at bedtime. ) 30 tablet 1   No current facility-administered medications for this visit.    Medication Side Effects: None  Allergies:  Allergies  Allergen Reactions   Iodinated Diagnostic Agents Rash and Anaphylaxis   Penicillins Shortness Of Breath and Rash    Did it involve swelling of the face/tongue/throat, SOB, or low BP? Yes Did it involve sudden or severe rash/hives, skin peeling, or any reaction on the inside of your mouth or nose? No Did you need to seek medical attention at a hospital or  doctor's office? No When did it last happen?Unk If all above answers are NO, may proceed with cephalosporin use.     Shellfish Allergy Anaphylaxis and Rash   Shellfish-Derived Products Anaphylaxis and Rash    Past Medical History:  Diagnosis Date   Brain cancer ([place]) 02/25/2011   grade III anaplastic ologodendrglioma   Hypertension    PTSD (post-traumatic stress disorder)    Seizures ([place])     Family History  Problem Relation Age of Onset   Cancer Other        brain cancer   Cancer Father        skin  cancer   Cancer Maternal Grandmother 63       brain cancer   Alcohol abuse Brother    Alzheimer's disease Paternal Grandmother    Dementia Paternal Grandmother    Heart disease Paternal Grandfather     Social History   Socioeconomic History   Marital status: Single    Spouse name: Not on file   Number of children: Not on file   Years of education: Not on file   Highest education level: Not on file  Occupational History   Not on file  Tobacco Use   Smoking status: Former Smoker    Quit date: 02/25/2011    Years since quitting: 9.3   Smokeless tobacco: Never Used  Substance and Sexual Activity   Alcohol use: Yes    Alcohol/week: 14.0 standard drinks    Types: 14 Cans of beer per week   Drug use: No   Sexual activity: Yes    Comment: E-cigarette users  Other Topics Concern   Not on file  Social History Narrative   Not on file   Social Determinants of Health   Financial Resource Strain:    Difficulty of Paying Living Expenses: Not on file  Food Insecurity:    Worried About Charity fundraiser in the Last Year: Not on file   [employer] of Food in the Last Year: Not on file  Transportation Needs:    Lack of Transportation (Medical): Not on file   Lack of Transportation (Non-Medical): Not on file  Physical Activity:    Days of Exercise per Week: Not on file   Minutes of Exercise per Session: Not on file  Stress:    Feeling of Stress : Not on file  Social Connections:    Frequency of Communication with Friends and Family: Not on file   Frequency of Social Gatherings with Friends and Family: Not on file   Attends Religious Services: Not on file   Active Member of Clubs or Organizations: Not on file   Attends Archivist Meetings: Not on file   Marital Status: Not on file  Intimate Partner Violence:    Fear of Current or Ex-Partner: Not on file   Emotionally Abused: Not on file   Physically Abused: Not on file   Sexually  Abused: Not on file    Past Medical History, Surgical history, Social history, and Family history were reviewed and updated as appropriate.   Please see review of systems for further details on the patient's review from today.   Objective:   Physical Exam:  BP (!) 176/114    Pulse 64   Physical Exam Constitutional:      General: He is not in acute distress. Musculoskeletal:        General: No deformity.  Neurological:     Mental Status: He is alert and oriented to  person, place, and time.     Coordination: Coordination normal.  Psychiatric:        Attention and Perception: Attention and perception normal. He does not perceive auditory or visual hallucinations.        Mood and Affect: Mood is anxious and depressed. Affect is not labile, blunt, angry or inappropriate.        Speech: Speech normal.        Behavior: Behavior normal.        Thought Content: Thought content normal. Thought content is not paranoid or delusional. Thought content does not include homicidal or suicidal ideation. Thought content does not include homicidal or suicidal plan.        Cognition and Memory: Cognition and memory normal.        Judgment: Judgment normal.     Comments: Insight intact     Lab Review:     Component Value Date/Time   NA 135 04/30/2020 1533   NA 141 06/21/2013 1505   K 4.6 04/30/2020 1533   K 4.2 06/21/2013 1505   CL 103 04/30/2020 1533   CO2 23 04/30/2020 1533   CO2 25 06/21/2013 1505   GLUCOSE 98 04/30/2020 1533   GLUCOSE 83 06/21/2013 1505   BUN 13 04/30/2020 1533   BUN 18.1 06/21/2013 1505   CREATININE 0.94 04/30/2020 1533   CREATININE 0.9 06/21/2013 1505   CALCIUM 9.0 04/30/2020 1533   CALCIUM 9.1 06/21/2013 1505   PROT 6.6 04/30/2020 1533   PROT 7.0 06/21/2013 1505   ALBUMIN 4.1 04/30/2020 1533   ALBUMIN 4.1 06/21/2013 1505   AST 34 04/30/2020 1533   AST 15 06/21/2013 1505   ALT 20 04/30/2020 1533   ALT 13 06/21/2013 1505   ALKPHOS 41 04/30/2020 1533    ALKPHOS 59 06/21/2013 1505   BILITOT 1.2 04/30/2020 1533   BILITOT 0.33 06/21/2013 1505   GFRNONAA >60 04/30/2020 1533   GFRAA >60 04/30/2020 1533       Component Value Date/Time   WBC 4.7 04/30/2020 1533   RBC 4.09 (L) 04/30/2020 1533   HGB 13.5 04/30/2020 1533   HGB 13.3 06/21/2013 1505   HCT 40.2 04/30/2020 1533   HCT 38.7 06/21/2013 1505   PLT 164 04/30/2020 1533   PLT 154 06/21/2013 1505   MCV 98.3 04/30/2020 1533   MCV 99.0 (H) 06/21/2013 1505   MCH 33.0 04/30/2020 1533   MCHC 33.6 04/30/2020 1533   RDW 12.3 04/30/2020 1533   RDW 13.3 06/21/2013 1505   LYMPHSABS 1.4 04/30/2020 1533   LYMPHSABS 1.1 06/21/2013 1505   MONOABS 0.5 04/30/2020 1533   MONOABS 0.5 06/21/2013 1505   EOSABS 0.1 04/30/2020 1533   EOSABS 0.1 06/21/2013 1505   BASOSABS 0.0 04/30/2020 1533   BASOSABS 0.0 06/21/2013 1505    No results found for: POCLITH, LITHIUM   No results found for: PHENYTOIN, PHENOBARB, VALPROATE, CBMZ   .res Assessment: Plan:   Discussed treatment plan with patient to include option to change Trintellix to another medication that may be more effective for Anxiety, such as Viibryd or Prozac.  Discussed not making any changes at this time since patient is having some acute stressors with new medical issues and that it may be difficult to discern if side effects are due to medication or reviewed medical issues.  Patient also reports that he would prefer to use current supply of Trintellix before changing medication. Will continue Trintellix 20 mg daily with food for depression. Continue BuSpar 30 mg twice  daily for anxiety. Continue doxazosin 4 mg at bedtime to prevent nightmares. Patient to follow-up in 2 months or sooner if clinically indicated. Recommend continuing psychotherapy with Lina Sayre, [facility].  Also discussed possible referrals for couples therapy. Patient advised to contact office with any questions, adverse effects, or acute worsening in signs and  symptoms.  [place] was seen today for anxiety, depression and sleeping problem.  Diagnoses and all orders for this visit:  PTSD (post-traumatic stress disorder) -     busPIRone (BUSPAR) 30 MG tablet; Take 1 tablet (30 mg total) by mouth 2 (two) times daily. -     doxazosin (CARDURA) 4 MG tablet; Take 1 tablet (4 mg total) by mouth at bedtime.  Nightmares -     doxazosin (CARDURA) 4 MG tablet; Take 1 tablet (4 mg total) by mouth at bedtime.  Mild episode of recurrent major depressive disorder ([place])     Please see After Visit Summary for patient specific instructions.  Future Appointments  Date Time Provider [place]  06/17/2020 11:00 AM Lina Sayre, [facility] CP-CP None  06/30/2020  8:00 AM Lina Sayre, [facility] CP-CP None  08/12/2020 10:30 AM Thayer Headings, PMHNP CP-CP None    No orders of the defined types were placed in this encounter.   -------------------------------

## 2020-06-16 DIAGNOSIS — L905 Scar conditions and fibrosis of skin: Secondary | ICD-10-CM | POA: Diagnosis not present

## 2020-06-16 DIAGNOSIS — L570 Actinic keratosis: Secondary | ICD-10-CM | POA: Diagnosis not present

## 2020-06-16 DIAGNOSIS — L821 Other seborrheic keratosis: Secondary | ICD-10-CM | POA: Diagnosis not present

## 2020-06-16 DIAGNOSIS — Z85828 Personal history of other malignant neoplasm of skin: Secondary | ICD-10-CM | POA: Diagnosis not present

## 2020-06-17 ENCOUNTER — Ambulatory Visit: Payer: BC Managed Care – PPO | Admitting: Psychiatry

## 2020-06-30 ENCOUNTER — Ambulatory Visit: Payer: BC Managed Care – PPO | Admitting: Psychiatry

## 2020-07-03 DIAGNOSIS — Z85828 Personal history of other malignant neoplasm of skin: Secondary | ICD-10-CM | POA: Diagnosis not present

## 2020-07-03 DIAGNOSIS — L578 Other skin changes due to chronic exposure to nonionizing radiation: Secondary | ICD-10-CM | POA: Diagnosis not present

## 2020-07-03 DIAGNOSIS — L905 Scar conditions and fibrosis of skin: Secondary | ICD-10-CM | POA: Diagnosis not present

## 2020-07-03 DIAGNOSIS — L814 Other melanin hyperpigmentation: Secondary | ICD-10-CM | POA: Diagnosis not present

## 2020-07-11 ENCOUNTER — Other Ambulatory Visit: Payer: Self-pay | Admitting: Psychiatry

## 2020-07-11 DIAGNOSIS — F4310 Post-traumatic stress disorder, unspecified: Secondary | ICD-10-CM

## 2020-07-11 DIAGNOSIS — F330 Major depressive disorder, recurrent, mild: Secondary | ICD-10-CM

## 2020-08-12 ENCOUNTER — Ambulatory Visit: Payer: BC Managed Care – PPO | Admitting: Psychiatry

## 2020-10-01 ENCOUNTER — Ambulatory Visit (INDEPENDENT_AMBULATORY_CARE_PROVIDER_SITE_OTHER): Payer: BC Managed Care – PPO | Admitting: Psychiatry

## 2020-10-01 ENCOUNTER — Other Ambulatory Visit: Payer: Self-pay

## 2020-10-01 ENCOUNTER — Encounter: Payer: Self-pay | Admitting: Psychiatry

## 2020-10-01 DIAGNOSIS — F4310 Post-traumatic stress disorder, unspecified: Secondary | ICD-10-CM

## 2020-10-01 DIAGNOSIS — F515 Nightmare disorder: Secondary | ICD-10-CM | POA: Diagnosis not present

## 2020-10-01 DIAGNOSIS — F330 Major depressive disorder, recurrent, mild: Secondary | ICD-10-CM

## 2020-10-01 MED ORDER — BUSPIRONE HCL 30 MG PO TABS: 30.0000 mg | ORAL_TABLET | Freq: Two times a day (BID) | ORAL | 1 refills | Status: DC

## 2020-10-01 MED ORDER — VORTIOXETINE HBR 20 MG PO TABS: 20.0000 mg | ORAL_TABLET | Freq: Every day | ORAL | 1 refills | Status: DC

## 2020-10-01 MED ORDER — DOXAZOSIN MESYLATE 4 MG PO TABS: 4.0000 mg | ORAL_TABLET | Freq: Every day | ORAL | 1 refills | Status: DC
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
## 2021-02-18 ENCOUNTER — Ambulatory Visit (INDEPENDENT_AMBULATORY_CARE_PROVIDER_SITE_OTHER): Payer: BC Managed Care – PPO | Admitting: Family

## 2021-02-18 ENCOUNTER — Encounter: Payer: Self-pay | Admitting: Family

## 2021-02-18 ENCOUNTER — Ambulatory Visit: Payer: BC Managed Care – PPO | Admitting: Family

## 2021-02-18 ENCOUNTER — Other Ambulatory Visit: Payer: Self-pay

## 2021-02-18 ENCOUNTER — Ambulatory Visit (INDEPENDENT_AMBULATORY_CARE_PROVIDER_SITE_OTHER)
Admission: RE | Admit: 2021-02-18 | Discharge: 2021-02-18 | Disposition: A | Discharge status: Home / Self Care | Payer: BC Managed Care – PPO | Source: Ambulatory Visit | Attending: Family | Admitting: Family

## 2021-02-18 VITALS — BP 160/100 | HR 81 | Temp 98.1°F | Ht 73.0 in | Wt 200.2 lb

## 2021-02-18 DIAGNOSIS — M25512 Pain in left shoulder: Secondary | ICD-10-CM

## 2021-02-18 DIAGNOSIS — M62838 Other muscle spasm: Secondary | ICD-10-CM

## 2021-02-18 DIAGNOSIS — I10 Essential (primary) hypertension: Secondary | ICD-10-CM

## 2021-02-18 MED ORDER — METHOCARBAMOL 500 MG PO TABS: 500.0000 mg | ORAL_TABLET | Freq: Three times a day (TID) | ORAL | 0 refills | Status: DC | PRN

## 2021-02-18 MED ORDER — BENAZEPRIL HCL 10 MG PO TABS: 10.0000 mg | ORAL_TABLET | Freq: Every day | ORAL | 1 refills | Status: DC

## 2021-02-18 NOTE — Progress Notes (Signed)
Shawn York is a 45 y.o. 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 180 tablet 1   cetirizine-pseudoephedrine (ZYRTEC-D) 5-120 MG tablet Take 1 tablet by mouth 2 (two) times daily as needed for allergies or rhinitis.      doxazosin (CARDURA) 4 MG tablet Take 1 tablet (4 mg total) by mouth at bedtime. 90 tablet 1   EPINEPHrine 0.3 mg/0.3 mL IJ SOAJ injection Inject 0.3 mg into the muscle once as needed (for a severe, allergic reaction).      methocarbamol (ROBAXIN) 500 MG tablet Take 1 tablet (500 mg total) by mouth every 8 (eight) hours as needed. 30 tablet 0   Oxcarbazepine (TRILEPTAL) 300 MG tablet Take 1 tablet (300 mg total) by mouth 2 (two) times daily. 60 tablet 0   vortioxetine HBr (TRINTELLIX) 20 MG TABS tablet Take 1 tablet (20 mg total) by mouth daily. 90 tablet 1   benazepril (LOTENSIN) 10 MG tablet Take 1 tablet (10 mg total) by mouth at bedtime. 90 tablet 1   No current facility-administered medications for this visit.    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   Hypertension    PTSD (post-traumatic stress disorder)    Seizures ([place])     Past Surgical History:  Procedure  Laterality Date   BASAL CELL CARCINOMA EXCISION  1995   CRANIOTOMY FOR TUMOR Right 02/25/2011   WISDOM TOOTH EXTRACTION      Family History  Problem Relation Age of Onset   Cancer Other        brain cancer   Cancer Father        skin cancer   Cancer Maternal Grandmother 52       brain cancer   Alcohol abuse Brother    Alzheimer's disease Paternal Grandmother    Dementia Paternal Grandmother    Heart disease Paternal Grandfather     Social History   Tobacco Use   Smoking status: Former    Pack years: 0.00    Types: Cigarettes    Quit date: 02/25/2011    Years since quitting: 9.9   Smokeless tobacco: Never  Substance Use Topics   Alcohol use: Yes    Alcohol/week: 14.0 standard drinks    Types: 14 Cans of beer per week    Subjective:   Patient last seen in October 2021; at that appointment, he was asked to call back to verify the dosage of his blood pressure readings; did not do that and actually notes he has been off his blood pressure medication for a number of months; would be agreeable to re-starting the Benazepril listed on his medication list;  Also complaining of left shoulder pain x 2-3 months; denies any injury or trauma; notes he feels like his ROM is limited in the left  shoulder; he is actually concerned he may have carpal tunnel syndrome; is already established patient with [facility] but has not reached out to them to schedule;   Mentions left sided muscle spasm x 1 week; seemed to start after recent vacation where he slept wrong;      Objective:  Vitals:   02/18/21 1414 02/18/21 1426  BP: (!) 158/102 (!) 160/100  Pulse: 81   Temp: 98.1 F (36.7 C)   TempSrc: Oral   SpO2: 99%   Weight: 200 lb 3.2 oz (90.8 kg)   Height: 6\' 1"  (1.854 m)     General: Well developed, well nourished, in no acute distress  Skin : Warm and dry.  Head: Normocephalic and atraumatic  Lungs: Respirations unlabored; clear to auscultation bilaterally without wheeze, rales,  rhonchi  CVS exam: normal rate and regular rhythm.  Vessels: Symmetric bilaterally  Neurologic: Alert and oriented; speech intact; face symmetrical; moves all extremities well; CNII-XII intact without focal deficit   Assessment:  1. Left shoulder pain, unspecified chronicity   2. Primary hypertension   3. Muscle spasm     Plan:  Update left shoulder X-ray; will most likely need to refer back to [facility] where he is already established; Re-start his blood pressure medication; re-check in 1 month; Trial of Robaxin 500 mg- not a candidate for Flexeril due to history of seizures;   This visit occurred during the SARS-CoV-2 public health emergency.  Safety protocols were in place, including screening questions prior to the visit, additional usage of staff PPE, and extensive cleaning of exam room while observing appropriate contact time as indicated for disinfecting solutions.    Return in about 4 weeks (around 03/18/2021) for blood pressure follow up.  Orders Placed This Encounter  Procedures   DG Shoulder Left    Standing Status:   Future    Standing Expiration Date:   02/18/2022    Order Specific Question:   Reason for Exam (SYMPTOM  OR DIAGNOSIS REQUIRED)    Answer:   left shoulder pain    Order Specific Question:   Preferred imaging location?    Answer:   [facility]    Requested Prescriptions   Signed Prescriptions Disp Refills   benazepril (LOTENSIN) 10 MG tablet 90 tablet 1    Sig: Take 1 tablet (10 mg total) by mouth at bedtime.   methocarbamol (ROBAXIN) 500 MG tablet 30 tablet 0    Sig: Take 1 tablet (500 mg total) by mouth every 8 (eight) hours as needed.

## 2021-02-18 NOTE — Patient Instructions (Signed)
Please go to the [place] location at [facility] to get your shoulder X-ray done; they are open from 8 am- 5 pm and you do not need an appointment; we will be in touch once we get your results back.

## 2021-02-22 ENCOUNTER — Other Ambulatory Visit: Payer: Self-pay | Admitting: Family

## 2021-02-22 DIAGNOSIS — M25512 Pain in left shoulder: Secondary | ICD-10-CM

## 2021-02-23 NOTE — Progress Notes (Signed)
Attempted to call the pt. No answer, so I left a message to call back.

## 2021-03-03 ENCOUNTER — Encounter: Payer: Self-pay | Admitting: Family

## 2021-03-03 ENCOUNTER — Telehealth: Payer: Self-pay

## 2021-03-03 NOTE — Telephone Encounter (Signed)
Pt has called to check the status of his referral. The referral was sent and approved. I have informed pt that since he is an established pt at [facility] then he can call to get scheduled. He stated understanding and will give them a call today.

## 2021-03-08 DIAGNOSIS — M25512 Pain in left shoulder: Secondary | ICD-10-CM | POA: Diagnosis not present

## 2021-03-12 ENCOUNTER — Telehealth: Payer: Self-pay
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
## 2021-03-12 NOTE — Telephone Encounter (Signed)
Pt called in states he is still experiencing pain left arm. Pt did have injection in shoulder 03/08/21 but believes it didn't work.

## 2021-03-18 ENCOUNTER — Other Ambulatory Visit: Payer: Self-pay

## 2021-03-18 ENCOUNTER — Ambulatory Visit (INDEPENDENT_AMBULATORY_CARE_PROVIDER_SITE_OTHER): Payer: BC Managed Care – PPO | Admitting: Family

## 2021-03-18 ENCOUNTER — Encounter: Payer: Self-pay | Admitting: Family

## 2021-03-18 VITALS — BP 128/80 | HR 74 | Temp 97.9°F | Ht 73.0 in | Wt 197.8 lb

## 2021-03-18 DIAGNOSIS — I10 Essential (primary) hypertension: Secondary | ICD-10-CM

## 2021-03-18 NOTE — Progress Notes (Signed)
Shawn York is a 45 y.o. 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 90 tablet 1   busPIRone (BUSPAR) 30 MG tablet Take 1 tablet (30 mg total) by mouth 2 (two) times daily. 180 tablet 1   cetirizine-pseudoephedrine (ZYRTEC-D) 5-120 MG tablet Take 1 tablet by mouth 2 (two) times daily as needed for allergies or rhinitis.      doxazosin (CARDURA) 4 MG tablet Take 1 tablet (4 mg total) by mouth at bedtime. 90 tablet 1   EPINEPHrine 0.3 mg/0.3 mL IJ SOAJ injection Inject 0.3 mg into the muscle once as needed (for a severe, allergic reaction).      Oxcarbazepine (TRILEPTAL) 300 MG tablet Take 1 tablet (300 mg total) by mouth 2 (two) times daily. 60 tablet 0   vortioxetine HBr (TRINTELLIX) 20 MG TABS tablet Take 1 tablet (20 mg total) by mouth daily. 90 tablet 1   No current facility-administered medications for this visit.    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   Hypertension    PTSD (post-traumatic stress disorder)    Seizures ([place])     Past Surgical History:  Procedure Laterality Date   BASAL CELL CARCINOMA EXCISION  1995   CRANIOTOMY FOR TUMOR Right 02/25/2011   WISDOM TOOTH EXTRACTION       Family History  Problem Relation Age of Onset   Cancer Other        brain cancer   Cancer Father        skin cancer   Cancer Maternal Grandmother 8       brain cancer   Alcohol abuse Brother    Alzheimer's disease Paternal Grandmother    Dementia Paternal Grandmother    Heart disease Paternal Grandfather     Social History   Tobacco Use   Smoking status: Former    Types: Cigarettes    Quit date: 02/25/2011    Years since quitting: 10.0   Smokeless tobacco: Never  Substance Use Topics   Alcohol use: Yes    Alcohol/week: 14.0 standard drinks    Types: 14 Cans of beer per week    Subjective:  Patient presents for follow up on his hypertension; has gotten back on his blood pressure medication and tolerating well; Will be transferring care to provider in [place];  Denies any chest pain, shortness of breath, blurred vision or headache;  Still working with orthopedist regarding shoulder pain- no relief with injection given; suspicious pain is coming from his neck and has follow up with them next week to discuss further.     Objective:  Vitals:   03/18/21 1040  BP: 128/80  Pulse: 74  Temp: 97.9 F (36.6 C)  TempSrc: Oral  SpO2: 99%  Weight: 197 lb 12.8 oz (89.7 kg)  Height: 6\' 1"  (1.854 m)  General: Well developed, well nourished, in no acute distress  Skin : Warm and dry.  Head: Normocephalic and atraumatic  Eyes: Sclera and conjunctiva clear; pupils round and reactive to light; extraocular movements intact  Neck: Supple without thyromegaly, adenopathy  Lungs: Respirations unlabored; clear to auscultation bilaterally without wheeze, rales, rhonchi  CVS exam: normal rate and regular rhythm.  Neurologic: Alert and oriented; speech intact; face symmetrical; moves all extremities well; CNII-XII intact without focal deficit   Assessment:  1. Essential hypertension     Plan:  Stable; EKG shows NSR; keep planned follow up with new PCP in September and labs can  be done at that time; Keep planned follow up with orthopedist regarding neck/ shoulder pain;   This visit occurred during the SARS-CoV-2 public health emergency.  Safety protocols were in place, including screening questions prior to the visit, additional usage of staff PPE, and extensive cleaning of exam room while observing appropriate contact time as indicated for disinfecting solutions.    No follow-ups on file.  Orders Placed This Encounter  Procedures   EKG 12-Lead    Requested Prescriptions    No prescriptions requested or ordered in this encounter

## 2021-03-22 DIAGNOSIS — M25512 Pain in left shoulder: Secondary | ICD-10-CM | POA: Diagnosis not present

## 2021-03-22 DIAGNOSIS — M542 Cervicalgia: Secondary | ICD-10-CM | POA: Diagnosis not present

## 2021-03-27 DIAGNOSIS — Z20822 Contact with and (suspected) exposure to covid-19: Secondary | ICD-10-CM | POA: Diagnosis not present

## 2021-04-01 ENCOUNTER — Ambulatory Visit: Payer: BC Managed Care – PPO | Admitting: Psychiatry

## 2021-04-09 DIAGNOSIS — M542 Cervicalgia: Secondary | ICD-10-CM | POA: Diagnosis not present

## 2021-04-14 DIAGNOSIS — C712 Malignant neoplasm of temporal lobe: Secondary | ICD-10-CM | POA: Diagnosis not present

## 2021-04-26 DIAGNOSIS — M5412 Radiculopathy, cervical region: Secondary | ICD-10-CM | POA: Diagnosis not present

## 2021-04-29 ENCOUNTER — Other Ambulatory Visit: Payer: Self-pay

## 2021-04-29 ENCOUNTER — Ambulatory Visit (INDEPENDENT_AMBULATORY_CARE_PROVIDER_SITE_OTHER): Payer: BC Managed Care – PPO | Admitting: Psychiatry

## 2021-04-29 ENCOUNTER — Encounter: Payer: Self-pay | Admitting: Psychiatry

## 2021-04-29 DIAGNOSIS — F515 Nightmare disorder: Secondary | ICD-10-CM | POA: Diagnosis not present

## 2021-04-29 DIAGNOSIS — F330 Major depressive disorder, recurrent, mild: Secondary | ICD-10-CM | POA: Diagnosis not present

## 2021-04-29 DIAGNOSIS — F4310 Post-traumatic stress disorder, unspecified: Secondary | ICD-10-CM

## 2021-04-29 DIAGNOSIS — F5105 Insomnia due to other mental disorder: Secondary | ICD-10-CM

## 2021-04-29 DIAGNOSIS — F99 Mental disorder, not otherwise specified: Secondary | ICD-10-CM

## 2021-04-29 MED ORDER — BUSPIRONE HCL 30 MG PO TABS: 30.0000 mg | ORAL_TABLET | Freq: Two times a day (BID) | ORAL | 1 refills | Status: DC

## 2021-04-29 MED ORDER — VORTIOXETINE HBR 20 MG PO TABS: 20.0000 mg | ORAL_TABLET | Freq: Every day | ORAL | 1 refills | Status: DC

## 2021-04-29 MED ORDER — DAYVIGO 5 MG PO TABS: 5.0000 mg | ORAL_TABLET | Freq: Every day | ORAL | 2 refills | Status: DC

## 2021-04-29 MED ORDER — DOXAZOSIN MESYLATE 4 MG PO TABS: 4.0000 mg | ORAL_TABLET | Freq: Every day | ORAL | 1 refills | Status: DC

## 2021-04-29 MED ORDER — DAYVIGO 5 MG PO TABS: 5.0000 mg | ORAL_TABLET | Freq: Every day | ORAL | 0 refills | Status: DC

## 2021-04-29 NOTE — Progress Notes (Signed)
Erol Odam [id number] [birth year] 45 y.o.  Subjective:   Patient ID:  Shawn York is a 45 y.o. (DOB Jul 29, 1976) male.  Chief Complaint:  Chief Complaint  Patient presents with   Sleeping Problem   Anxiety   Depression    Anxiety    Depression        Past medical history includes anxiety.   Shawn York presents to the office today for follow-up of anxiety, insomnia, and depression. He is accompanied by his 72 yo daughter. He recently started school again. He was previously taking IT courses and took a break with Pandemic shutdowns.He learned that some past credits will not transfer and decided to change tracks. He and his brother have been using a laser cutter to make maps and has decided to an associates of science. He reports that he may consider career in GIS/city planning.   Son is in kindergarten and daughter is in preschool.   He reports anxiety is "my constant companion." He reports that he has been taking medication consistently. He reports anxiety "is always the same." Denies panic. Denies any change in mood. He reports multiple awakenings during the night. He reports frequent nightmares. He reports some worry about children's safety and intrusive thoughts. Energy and motivation are ok. Concentration is ok. Denies SI.   Past Psychiatric Medication Trials: Trintellix Rexulti Trileptal Buspar Remeron Zoloft- Sexual side effects Hydroxyzine- Drowsiness Doxazosin  PHQ2-9    Flowsheet Row Office Visit from 03/18/2021 in [facility] at [place] Visit from 02/18/2021 in [facility] at [place] Visit from 06/02/2020 in [place] at [facility]  PHQ-2 Total Score 2 3 0  PHQ-9 Total Score 5 12 0        Review of Systems:  Review of Systems  Gastrointestinal: Negative.   Musculoskeletal:  Positive for neck pain. Negative for gait problem.       Slipped disc. Left arm numbness.    Neurological:  Negative for tremors.  Psychiatric/Behavioral:  Positive for depression.        Please refer to HPI   Medications: I have reviewed the patient's current medications.  Current Outpatient Medications  Medication Sig Dispense Refill   benazepril (LOTENSIN) 10 MG tablet Take 1 tablet (10 mg total) by mouth at bedtime. 90 tablet 1   cetirizine-pseudoephedrine (ZYRTEC-D) 5-120 MG tablet Take 1 tablet by mouth 2 (two) times daily as needed for allergies or rhinitis.      Lemborexant (DAYVIGO) 5 MG TABS Take 5 mg by mouth at bedtime. Can increase to 10 mg po QHS after 3-5 days if 5 mg is ineffective 10 tablet 0   Lemborexant (DAYVIGO) 5 MG TABS Take 5 mg by mouth at bedtime. 30 tablet 2   Oxcarbazepine (TRILEPTAL) 300 MG tablet Take 1 tablet (300 mg total) by mouth 2 (two) times daily. 60 tablet 0   busPIRone (BUSPAR) 30 MG tablet Take 1 tablet (30 mg total) by mouth 2 (two) times daily. 180 tablet 1   doxazosin (CARDURA) 4 MG tablet Take 1 tablet (4 mg total) by mouth at bedtime. 90 tablet 1   EPINEPHrine 0.3 mg/0.3 mL IJ SOAJ injection Inject 0.3 mg into the muscle once as needed (for a severe, allergic reaction).      vortioxetine HBr (TRINTELLIX) 20 MG TABS tablet Take 1 tablet (20 mg total) by mouth daily. 90 tablet 1   No current facility-administered medications for this visit.    Medication Side  Effects: None  Allergies:  Allergies  Allergen Reactions   Iodinated Diagnostic Agents Rash and Anaphylaxis   Penicillins Shortness Of Breath and Rash    Did it involve swelling of the face/tongue/throat, SOB, or low BP? Yes Did it involve sudden or severe rash/hives, skin peeling, or any reaction on the inside of your mouth or nose? No Did you need to seek medical attention at a hospital or doctor's office? No When did it last happen? Unk If all above answers are "NO", may proceed with cephalosporin use.     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   Please see review of systems for further details on the patient's review from today.   Objective:   Physical Exam:  There were no vitals taken for this visit.  Physical Exam Constitutional:      General: He is not in acute distress. Musculoskeletal:        General: No deformity.  Neurological:     Mental Status: He is alert and oriented to person, place, and time.     Coordination: Coordination normal.  Psychiatric:        Attention and Perception: Attention and perception normal. He does not perceive auditory or visual hallucinations.        Mood and Affect: Mood is anxious and depressed. Affect is not labile, blunt, angry or inappropriate.        Speech: Speech normal.        Behavior: Behavior normal. Behavior is cooperative.        Thought Content: Thought content normal. Thought content is not paranoid or delusional. Thought content does not include homicidal or suicidal ideation. Thought content does not include homicidal or suicidal plan.        Cognition and Memory: Cognition and memory normal.        Judgment: Judgment normal.     Comments: Insight intact    Lab Review:     Component Value Date/Time   NA 135 04/30/2020 1533   NA 141 06/21/2013 1505   K 4.6 04/30/2020 1533   K 4.2 06/21/2013 1505   CL 103 04/30/2020 1533   CO2 23 04/30/2020 1533   CO2 25 06/21/2013 1505   GLUCOSE 98 04/30/2020 1533   GLUCOSE 83 06/21/2013 1505   BUN 13 04/30/2020 1533   BUN 18.1 06/21/2013 1505   CREATININE 0.94 04/30/2020 1533   CREATININE 0.9 06/21/2013 1505   CALCIUM 9.0 04/30/2020 1533   CALCIUM 9.1 06/21/2013 1505   PROT 6.6  04/30/2020 1533   PROT 7.0 06/21/2013 1505   ALBUMIN 4.1 04/30/2020 1533   ALBUMIN 4.1 06/21/2013 1505   AST 34 04/30/2020 1533   AST 15 06/21/2013 1505   ALT 20 04/30/2020 1533   ALT 13 06/21/2013 1505   ALKPHOS 41 04/30/2020 1533   ALKPHOS 59 06/21/2013 1505   BILITOT 1.2 04/30/2020 1533   BILITOT 0.33 06/21/2013 1505   GFRNONAA >60 04/30/2020 1533   GFRAA >60 04/30/2020 1533       Component Value Date/Time   WBC 4.7 04/30/2020 1533   RBC 4.09 (L) 04/30/2020 1533   HGB 13.5  04/30/2020 1533   HGB 13.3 06/21/2013 1505   HCT 40.2 04/30/2020 1533   HCT 38.7 06/21/2013 1505   PLT 164 04/30/2020 1533   PLT 154 06/21/2013 1505   MCV 98.3 04/30/2020 1533   MCV 99.0 (H) 06/21/2013 1505   MCH 33.0 04/30/2020 1533   MCHC 33.6 04/30/2020 1533   RDW 12.3 04/30/2020 1533   RDW 13.3 06/21/2013 1505   LYMPHSABS 1.4 04/30/2020 1533   LYMPHSABS 1.1 06/21/2013 1505   MONOABS 0.5 04/30/2020 1533   MONOABS 0.5 06/21/2013 1505   EOSABS 0.1 04/30/2020 1533   EOSABS 0.1 06/21/2013 1505   BASOSABS 0.0 04/30/2020 1533   BASOSABS 0.0 06/21/2013 1505    No results found for: POCLITH, LITHIUM   No results found for: PHENYTOIN, PHENOBARB, VALPROATE, CBMZ   .res Assessment: Plan:    Pt seen for 30 minutes and time spent discussing possible treatment options. Discusesed potential benefits, risks, and and side effects of Dayvigo for insomnia.  Patient agrees to trial of Dayvigo.  Patient provided with Dayvigo 5 mg samples and advised to take 1 tablet by mouth at bedtime, and to increase to 2 tabs after 3 to 5 days if 5 mg dose is ineffective.  Patient advised to contact office if Dayvigo is effective and to request a script for the dose that is effective. Continue Trintellix 20 mg po qd for depression and anxiety.  Continue Buspar 30 mg po BID for anxiety.  Continue Doxazosin 4 mg po QHS for nightmares.  Pt to follow-up in 4 weeks or sooner if clinically indicated.  Patient advised to contact  office with any questions, adverse effects, or acute worsening in signs and symptoms.   Rector was seen today for sleeping problem, anxiety and depression.  Diagnoses and all orders for this visit:  Insomnia due to other mental disorder -     Lemborexant (DAYVIGO) 5 MG TABS; Take 5 mg by mouth at bedtime. Can increase to 10 mg po QHS after 3-5 days if 5 mg is ineffective -     Lemborexant (DAYVIGO) 5 MG TABS; Take 5 mg by mouth at bedtime.  PTSD (post-traumatic stress disorder) -     busPIRone (BUSPAR) 30 MG tablet; Take 1 tablet (30 mg total) by mouth 2 (two) times daily. -     doxazosin (CARDURA) 4 MG tablet; Take 1 tablet (4 mg total) by mouth at bedtime. -     vortioxetine HBr (TRINTELLIX) 20 MG TABS tablet; Take 1 tablet (20 mg total) by mouth daily.  Nightmares -     doxazosin (CARDURA) 4 MG tablet; Take 1 tablet (4 mg total) by mouth at bedtime.  MDD (major depressive disorder), recurrent episode, mild (HCC) -     vortioxetine HBr (TRINTELLIX) 20 MG TABS tablet; Take 1 tablet (20 mg total) by mouth daily.    Please see After Visit Summary for patient specific instructions.  Future Appointments  Date Time Provider Alexander  05/17/2021  2:00 PM Janith Lima, MD [facility] None  05/27/2021 11:00 AM Thayer Headings, PMHNP [facility] None    No orders of the defined types were placed in this encounter.   -------------------------------

## 2021-05-01 ENCOUNTER — Encounter: Payer: Self-pay | Admitting: Psychiatry

## 2021-05-04 ENCOUNTER — Telehealth: Payer: Self-pay

## 2021-05-04 NOTE — Telephone Encounter (Signed)
Prior Authorization submitted and approved for DAYVIGO 5 MG effective 05/04/2021-05/03/2022 with [facility]

## 2021-05-06 DIAGNOSIS — Z6825 Body mass index (BMI) 25.0-25.9, adult: Secondary | ICD-10-CM | POA: Diagnosis not present

## 2021-05-06 DIAGNOSIS — M5412 Radiculopathy, cervical region: Secondary | ICD-10-CM | POA: Diagnosis not present

## 2021-05-06 DIAGNOSIS — I10 Essential (primary) hypertension: Secondary | ICD-10-CM | POA: Diagnosis not present

## 2021-05-07 DIAGNOSIS — M5412 Radiculopathy, cervical region: Secondary | ICD-10-CM | POA: Diagnosis not present

## 2021-05-17 ENCOUNTER — Other Ambulatory Visit: Payer: Self-pay

## 2021-05-17 ENCOUNTER — Ambulatory Visit (INDEPENDENT_AMBULATORY_CARE_PROVIDER_SITE_OTHER): Payer: BC Managed Care – PPO | Admitting: Internal Medicine

## 2021-05-17 ENCOUNTER — Encounter: Payer: Self-pay | Admitting: Internal Medicine

## 2021-05-17 VITALS — BP 124/78 | HR 85 | Temp 98.0°F | Ht 73.0 in | Wt 195.0 lb

## 2021-05-17 DIAGNOSIS — I10 Essential (primary) hypertension: Secondary | ICD-10-CM | POA: Diagnosis not present

## 2021-05-17 DIAGNOSIS — Z23 Encounter for immunization: Secondary | ICD-10-CM | POA: Diagnosis not present

## 2021-05-17 DIAGNOSIS — Z1159 Encounter for screening for other viral diseases: Secondary | ICD-10-CM

## 2021-05-17 DIAGNOSIS — Z0001 Encounter for general adult medical examination with abnormal findings: Secondary | ICD-10-CM

## 2021-05-17 DIAGNOSIS — M542 Cervicalgia: Secondary | ICD-10-CM | POA: Diagnosis not present

## 2021-05-17 DIAGNOSIS — Z1211 Encounter for screening for malignant neoplasm of colon: Secondary | ICD-10-CM | POA: Insufficient documentation

## 2021-05-17 DIAGNOSIS — D539 Nutritional anemia, unspecified: Secondary | ICD-10-CM | POA: Diagnosis not present

## 2021-05-17 DIAGNOSIS — R7989 Other specified abnormal findings of blood chemistry: Secondary | ICD-10-CM

## 2021-05-17 LAB — CBC WITH DIFFERENTIAL/PLATELET
Basophils Absolute: 0 10*3/uL (ref 0.0–0.1)
Basophils Relative: 0.5 % (ref 0.0–3.0)
Eosinophils Absolute: 0.1 10*3/uL (ref 0.0–0.7)
Eosinophils Relative: 2.4 % (ref 0.0–5.0)
HCT: 37.8 % — ABNORMAL LOW (ref 39.0–52.0)
Hemoglobin: 12.9 g/dL — ABNORMAL LOW (ref 13.0–17.0)
Lymphocytes Relative: 48 % — ABNORMAL HIGH (ref 12.0–46.0)
Lymphs Abs: 2.1 10*3/uL (ref 0.7–4.0)
MCHC: 34 g/dL (ref 30.0–36.0)
MCV: 98 fl (ref 78.0–100.0)
Monocytes Absolute: 0.5 10*3/uL (ref 0.1–1.0)
Monocytes Relative: 11.1 % (ref 3.0–12.0)
Neutro Abs: 1.7 10*3/uL (ref 1.4–7.7)
Neutrophils Relative %: 38 % — ABNORMAL LOW (ref 43.0–77.0)
Platelets: 188 10*3/uL (ref 150.0–400.0)
RBC: 3.86 Mil/uL — ABNORMAL LOW (ref 4.22–5.81)
RDW: 14.5 % (ref 11.5–15.5)
WBC: 4.4 10*3/uL (ref 4.0–10.5)

## 2021-05-17 LAB — URINALYSIS, ROUTINE W REFLEX MICROSCOPIC
Bilirubin Urine: NEGATIVE
Hgb urine dipstick: NEGATIVE
Ketones, ur: NEGATIVE
Leukocytes,Ua: NEGATIVE
Nitrite: NEGATIVE
RBC / HPF: NONE SEEN (ref 0–?)
Specific Gravity, Urine: 1.015 (ref 1.000–1.030)
Total Protein, Urine: NEGATIVE
Urine Glucose: NEGATIVE
Urobilinogen, UA: 1 (ref 0.0–1.0)
WBC, UA: NONE SEEN (ref 0–?)
pH: 7 (ref 5.0–8.0)

## 2021-05-17 LAB — LIPID PANEL
Cholesterol: 213 mg/dL — ABNORMAL HIGH (ref 0–200)
HDL: 53.6 mg/dL (ref >39.00)
NonHDL: 159.66
Total CHOL/HDL Ratio: 4
Triglycerides: 271 mg/dL — ABNORMAL HIGH (ref 0.0–149.0)
VLDL: 54.2 mg/dL — ABNORMAL HIGH (ref 0.0–40.0)

## 2021-05-17 LAB — HEPATIC FUNCTION PANEL
ALT: 81 U/L — ABNORMAL HIGH (ref 0–53)
AST: 55 U/L — ABNORMAL HIGH (ref 0–37)
Albumin: 4.4 g/dL (ref 3.5–5.2)
Alkaline Phosphatase: 59 U/L (ref 39–117)
Bilirubin, Direct: 0.1 mg/dL (ref 0.0–0.3)
Total Bilirubin: 0.7 mg/dL (ref 0.2–1.2)
Total Protein: 7.4 g/dL (ref 6.0–8.3)

## 2021-05-17 LAB — TSH: TSH: 1.37 u[IU]/mL (ref 0.35–5.50)

## 2021-05-17 LAB — BASIC METABOLIC PANEL
BUN: 11 mg/dL (ref 6–23)
CO2: 30 mEq/L (ref 19–32)
Calcium: 9.4 mg/dL (ref 8.4–10.5)
Chloride: 101 mEq/L (ref 96–112)
Creatinine, Ser: 1.04 mg/dL (ref 0.40–1.50)
GFR: 87.06 mL/min (ref >60.00)
Glucose, Bld: 87 mg/dL (ref 70–99)
Potassium: 4.2 mEq/L (ref 3.5–5.1)
Sodium: 137 mEq/L (ref 135–145)

## 2021-05-17 LAB — LDL CHOLESTEROL, DIRECT: Direct LDL: 127 mg/dL

## 2021-05-17 NOTE — Patient Instructions (Signed)
Health Maintenance, Male Adopting a healthy lifestyle and getting preventive care are important in promoting health and wellness. Ask your health care provider about: The right schedule for you to have regular tests and exams. Things you can do on your own to prevent diseases and keep yourself healthy. What should I know about diet, weight, and exercise? Eat a healthy diet  Eat a diet that includes plenty of vegetables, fruits, low-fat dairy products, and lean protein. Do not eat a lot of foods that are high in solid fats, added sugars, or sodium. Maintain a healthy weight Body mass index (BMI) is a measurement that can be used to identify possible weight problems. It estimates body fat based on height and weight. Your health care provider can help determine your BMI and help you achieve or maintain a healthy weight. Get regular exercise Get regular exercise. This is one of the most important things you can do for your health. Most adults should: Exercise for at least 150 minutes each week. The exercise should increase your heart rate and make you sweat (moderate-intensity exercise). Do strengthening exercises at least twice a week. This is in addition to the moderate-intensity exercise. Spend less time sitting. Even light physical activity can be beneficial. Watch cholesterol and blood lipids Have your blood tested for lipids and cholesterol at 45 years of age, then have this test every 5 years. You may need to have your cholesterol levels checked more often if: Your lipid or cholesterol levels are high. You are older than 45 years of age. You are at high risk for heart disease. What should I know about cancer screening? Many types of cancers can be detected early and may often be prevented. Depending on your health history and family history, you may need to have cancer screening at various ages. This may include screening for: Colorectal cancer. Prostate cancer. Skin cancer. Lung  cancer. What should I know about heart disease, diabetes, and high blood pressure? Blood pressure and heart disease High blood pressure causes heart disease and increases the risk of stroke. This is more likely to develop in people who have high blood pressure readings, are of African descent, or are overweight. Talk with your health care provider about your target blood pressure readings. Have your blood pressure checked: Every 3-5 years if you are 18-39 years of age. Every year if you are 40 years old or older. If you are between the ages of 65 and 75 and are a current or former smoker, ask your health care provider if you should have a one-time screening for abdominal aortic aneurysm (AAA). Diabetes Have regular diabetes screenings. This checks your fasting blood sugar level. Have the screening done: Once every three years after age 45 if you are at a normal weight and have a low risk for diabetes. More often and at a younger age if you are overweight or have a high risk for diabetes. What should I know about preventing infection? Hepatitis B If you have a higher risk for hepatitis B, you should be screened for this virus. Talk with your health care provider to find out if you are at risk for hepatitis B infection. Hepatitis C Blood testing is recommended for: Everyone born from 1945 through 1965. Anyone with known risk factors for hepatitis C. Sexually transmitted infections (STIs) You should be screened each year for STIs, including gonorrhea and chlamydia, if: You are sexually active and are younger than 45 years of age. You are older than 45 years   of age and your health care provider tells you that you are at risk for this type of infection. Your sexual activity has changed since you were last screened, and you are at increased risk for chlamydia or gonorrhea. Ask your health care provider if you are at risk. Ask your health care provider about whether you are at high risk for HIV.  Your health care provider may recommend a prescription medicine to help prevent HIV infection. If you choose to take medicine to prevent HIV, you should first get tested for HIV. You should then be tested every 3 months for as long as you are taking the medicine. Follow these instructions at home: Lifestyle Do not use any products that contain nicotine or tobacco, such as cigarettes, e-cigarettes, and chewing tobacco. If you need help quitting, ask your health care provider. Do not use street drugs. Do not share needles. Ask your health care provider for help if you need support or information about quitting drugs. Alcohol use Do not drink alcohol if your health care provider tells you not to drink. If you drink alcohol: Limit how much you have to 0-2 drinks a day. Be aware of how much alcohol is in your drink. In the [place] one drink equals one 12 oz bottle of beer (355 mL), one 5 oz glass of wine (148 mL), or one 1 oz glass of hard liquor (44 mL). General instructions Schedule regular health, dental, and eye exams. Stay current with your vaccines. Tell your health care provider if: You often feel depressed. You have ever been abused or do not feel safe at home. Summary Adopting a healthy lifestyle and getting preventive care are important in promoting health and wellness. Follow your health care provider's instructions about healthy diet, exercising, and getting tested or screened for diseases. Follow your health care provider's instructions on monitoring your cholesterol and blood pressure. This information is not intended to replace advice given to you by your health care provider. Make sure you discuss any questions you have with your health care provider. Document Revised: 10/23/2020 Document Reviewed: 08/08/2018 Elsevier Patient Education  2022 Elsevier Inc.  

## 2021-05-17 NOTE — Progress Notes (Signed)
Subjective:  Patient ID: Doniven Kamm, male    DOB: Jun 10, 1976  Age: 45 y.o. MRN: [id number]  CC: Annual Exam and Hypertension  This visit occurred during the SARS-CoV-2 public health emergency.  Safety protocols were in place, including screening questions prior to the visit, additional usage of staff PPE, and extensive cleaning of exam room while observing appropriate contact time as indicated for disinfecting solutions.    HPI Travontae Calafiore presents for a CPX and f/up -   He tells me that his blood pressure has been well controlled. He is seeing a NS about cervical DDD. He denies CP, SOB, dizziness, lightheadedness, edema, or fatigue.  He has numbness and tingling in his left upper extremity.  Outpatient Medications Prior to Visit  Medication Sig Dispense Refill   benazepril (LOTENSIN) 10 MG tablet Take 1 tablet (10 mg total) by mouth at bedtime. 90 tablet 1   busPIRone (BUSPAR) 30 MG tablet Take 1 tablet (30 mg total) by mouth 2 (two) times daily. 180 tablet 1   cetirizine-pseudoephedrine (ZYRTEC-D) 5-120 MG tablet Take 1 tablet by mouth 2 (two) times daily as needed for allergies or rhinitis.      doxazosin (CARDURA) 4 MG tablet Take 1 tablet (4 mg total) by mouth at bedtime. 90 tablet 1   EPINEPHrine 0.3 mg/0.3 mL IJ SOAJ injection Inject 0.3 mg into the muscle once as needed (for a severe, allergic reaction).      Lemborexant (DAYVIGO) 5 MG TABS Take 5 mg by mouth at bedtime. Can increase to 10 mg po QHS after 3-5 days if 5 mg is ineffective 10 tablet 0   Lemborexant (DAYVIGO) 5 MG TABS Take 5 mg by mouth at bedtime. 30 tablet 2   Oxcarbazepine (TRILEPTAL) 300 MG tablet Take 1 tablet (300 mg total) by mouth 2 (two) times daily. 60 tablet 0   vortioxetine HBr (TRINTELLIX) 20 MG TABS tablet Take 1 tablet (20 mg total) by mouth daily. 90 tablet 1   No facility-administered medications prior to visit.    ROS Review of Systems  Constitutional:  Negative for diaphoresis, fatigue and  unexpected weight change.  HENT:  Negative for trouble swallowing.   Eyes: Negative.   Respiratory:  Negative for cough, chest tightness, shortness of breath and wheezing.   Cardiovascular:  Negative for chest pain, palpitations and leg swelling.  Gastrointestinal:  Negative for abdominal pain, blood in stool, constipation, diarrhea and vomiting.  Endocrine: Negative.   Genitourinary: Negative.  Negative for difficulty urinating, dysuria, hematuria, scrotal swelling and testicular pain.  Musculoskeletal:  Positive for neck pain. Negative for arthralgias, back pain and myalgias.  Skin: Negative.   Neurological:  Positive for numbness. Negative for dizziness, tremors, speech difficulty, weakness, light-headedness and headaches.  Hematological:  Negative for adenopathy. Does not bruise/bleed easily.  Psychiatric/Behavioral: Negative.     Objective:  BP 124/78   Pulse 85   Temp 98 F (36.7 C) (Oral)   Ht '6\' 1"'$  (1.854 m)   Wt 195 lb (88.5 kg)   SpO2 99%   BMI 25.73 kg/m   BP Readings from Last 3 Encounters:  05/17/21 124/78  03/18/21 128/80  02/18/21 (!) 160/100    Wt Readings from Last 3 Encounters:  05/17/21 195 lb (88.5 kg)  03/18/21 197 lb 12.8 oz (89.7 kg)  02/18/21 200 lb 3.2 oz (90.8 kg)    Physical Exam Vitals reviewed.  HENT:     Nose: Nose normal.     Mouth/Throat:  Mouth: Mucous membranes are moist.  Eyes:     General: No scleral icterus.    Conjunctiva/sclera: Conjunctivae normal.  Cardiovascular:     Rate and Rhythm: Normal rate and regular rhythm.     Heart sounds: No murmur heard. Pulmonary:     Effort: Pulmonary effort is normal.     Breath sounds: No stridor. No wheezing, rhonchi or rales.  Abdominal:     General: Abdomen is flat. Bowel sounds are normal. There is no distension.     Palpations: Abdomen is soft. There is no hepatomegaly, splenomegaly or mass.     Tenderness: There is no abdominal tenderness. There is no guarding.  Musculoskeletal:         General: Normal range of motion.     Cervical back: Neck supple.     Right lower leg: No edema.     Left lower leg: No edema.  Lymphadenopathy:     Cervical: No cervical adenopathy.  Skin:    General: Skin is warm and dry.     Findings: No rash.  Neurological:     General: No focal deficit present.     Mental Status: He is alert.  Psychiatric:        Mood and Affect: Mood normal.        Behavior: Behavior normal.    Lab Results  Component Value Date   WBC 4.4 05/17/2021   HGB 12.9 (L) 05/17/2021   HCT 37.8 (L) 05/17/2021   PLT 188.0 05/17/2021   GLUCOSE 87 05/17/2021   CHOL 213 (H) 05/17/2021   TRIG 271.0 (H) 05/17/2021   HDL 53.60 05/17/2021   LDLDIRECT 127.0 05/17/2021   ALT 81 (H) 05/17/2021   AST 55 (H) 05/17/2021   NA 137 05/17/2021   K 4.2 05/17/2021   CL 101 05/17/2021   CREATININE 1.04 05/17/2021   BUN 11 05/17/2021   CO2 30 05/17/2021   TSH 1.37 05/17/2021    DG Shoulder Left  Result Date: 02/19/2021 CLINICAL DATA:  Left shoulder pain EXAM: LEFT SHOULDER - 2+ VIEW COMPARISON:  None. FINDINGS: There is no evidence of fracture or dislocation. There is no evidence of arthropathy or other focal bone abnormality. Soft tissues are unremarkable. IMPRESSION: Negative. Electronically Signed   By: Fidela Salisbury MD   On: 02/19/2021 22:55    Assessment & Plan:   Duffie was seen today for annual exam and hypertension.  Diagnoses and all orders for this visit:  Encounter for general adult medical examination with abnormal findings- Exam completed, labs reviewed, vaccines reviewed and updated, no cancer screenings indicated, patient education was given. -     Lipid panel; Future -     Hepatitis C antibody; Future -     HIV Antibody (routine testing w rflx); Future -     HIV Antibody (routine testing w rflx) -     Hepatitis C antibody -     Lipid panel  Primary hypertension- His blood pressure is adequately well controlled.  Electrolytes and renal function  are normal. -     CBC with Differential/Platelet; Future -     Basic metabolic panel; Future -     TSH; Future -     Urinalysis, Routine w reflex microscopic; Future -     Hepatic function panel; Future -     Hepatic function panel -     Urinalysis, Routine w reflex microscopic -     TSH -     Basic metabolic panel -  CBC with Differential/Platelet  Need for hepatitis C screening test -     Hepatitis C antibody; Future -     Hepatitis C antibody  Elevated LFTs- I have asked him to return to have this evaluated.  Deficiency anemia- I have asked him to return to have this evaluated.  Other orders -     Flu Vaccine QUAD 6+ mos PF IM (Fluarix Quad PF) -     LDL cholesterol, direct  I am having [name] maintain his EPINEPHrine, Oxcarbazepine, cetirizine-pseudoephedrine, benazepril, DayVigo, DayVigo, busPIRone, doxazosin, and vortioxetine HBr.  No orders of the defined types were placed in this encounter.    Follow-up: Return in about 6 months (around 11/14/2021).  Scarlette Calico, MD

## 2021-05-18 DIAGNOSIS — R7989 Other specified abnormal findings of blood chemistry: Secondary | ICD-10-CM | POA: Insufficient documentation

## 2021-05-18 DIAGNOSIS — D539 Nutritional anemia, unspecified: Secondary | ICD-10-CM | POA: Insufficient documentation

## 2021-05-18 LAB — HEPATITIS C ANTIBODY
Hepatitis C Ab: NONREACTIVE
SIGNAL TO CUT-OFF: 0.05 (ref <1.00)

## 2021-05-18 LAB — HIV ANTIBODY (ROUTINE TESTING W REFLEX): HIV 1&2 Ab, 4th Generation: NONREACTIVE

## 2021-05-19 DIAGNOSIS — M542 Cervicalgia: Secondary | ICD-10-CM | POA: Diagnosis not present

## 2021-05-19 DIAGNOSIS — R531 Weakness: Secondary | ICD-10-CM | POA: Diagnosis not present

## 2021-05-19 DIAGNOSIS — M2569 Stiffness of other specified joint, not elsewhere classified: Secondary | ICD-10-CM | POA: Diagnosis not present

## 2021-05-19 DIAGNOSIS — M5412 Radiculopathy, cervical region: Secondary | ICD-10-CM | POA: Diagnosis not present

## 2021-05-23 ENCOUNTER — Encounter: Payer: Self-pay | Admitting: Internal Medicine

## 2021-05-24 DIAGNOSIS — M542 Cervicalgia: Secondary | ICD-10-CM | POA: Diagnosis not present

## 2021-05-24 DIAGNOSIS — R531 Weakness: Secondary | ICD-10-CM | POA: Diagnosis not present

## 2021-05-24 DIAGNOSIS — M2569 Stiffness of other specified joint, not elsewhere classified: Secondary | ICD-10-CM | POA: Diagnosis not present

## 2021-05-24 DIAGNOSIS — M5412 Radiculopathy, cervical region: Secondary | ICD-10-CM | POA: Diagnosis not present

## 2021-05-26 DIAGNOSIS — M2569 Stiffness of other specified joint, not elsewhere classified: Secondary | ICD-10-CM | POA: Diagnosis not present

## 2021-05-26 DIAGNOSIS — M542 Cervicalgia: Secondary | ICD-10-CM | POA: Diagnosis not present

## 2021-05-26 DIAGNOSIS — M5412 Radiculopathy, cervical region: Secondary | ICD-10-CM | POA: Diagnosis not present

## 2021-05-26 DIAGNOSIS — R531 Weakness: Secondary | ICD-10-CM | POA: Diagnosis not present

## 2021-05-27 ENCOUNTER — Other Ambulatory Visit: Payer: Self-pay

## 2021-05-27 ENCOUNTER — Encounter: Payer: Self-pay | Admitting: Psychiatry

## 2021-05-27 ENCOUNTER — Other Ambulatory Visit: Payer: Self-pay | Admitting: Psychiatry

## 2021-05-27 ENCOUNTER — Ambulatory Visit (INDEPENDENT_AMBULATORY_CARE_PROVIDER_SITE_OTHER): Payer: BC Managed Care – PPO | Admitting: Psychiatry

## 2021-05-27 DIAGNOSIS — F4310 Post-traumatic stress disorder, unspecified: Secondary | ICD-10-CM

## 2021-05-27 DIAGNOSIS — F99 Mental disorder, not otherwise specified: Secondary | ICD-10-CM | POA: Diagnosis not present

## 2021-05-27 DIAGNOSIS — F330 Major depressive disorder, recurrent, mild: Secondary | ICD-10-CM

## 2021-05-27 DIAGNOSIS — M5412 Radiculopathy, cervical region: Secondary | ICD-10-CM | POA: Diagnosis not present

## 2021-05-27 DIAGNOSIS — F5105 Insomnia due to other mental disorder: Secondary | ICD-10-CM

## 2021-05-27 MED ORDER — VORTIOXETINE HBR 10 MG PO TABS: ORAL_TABLET | ORAL | 0 refills | Status: DC

## 2021-05-27 MED ORDER — HYDROXYZINE HCL 25 MG PO TABS: 25.0000 mg | ORAL_TABLET | Freq: Every evening | ORAL | 2 refills | Status: DC | PRN

## 2021-05-27 MED ORDER — VILAZODONE HCL 20 MG PO TABS: 20.0000 mg | ORAL_TABLET | Freq: Every day | ORAL | 1 refills | Status: DC

## 2021-05-27 MED ORDER — VILAZODONE HCL 10 MG PO TABS: ORAL_TABLET | ORAL | 0 refills | Status: DC
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
## 2021-05-27 NOTE — Telephone Encounter (Signed)
Noted will send PA

## 2021-05-31 DIAGNOSIS — R531 Weakness: Secondary | ICD-10-CM | POA: Diagnosis not present

## 2021-05-31 DIAGNOSIS — M5412 Radiculopathy, cervical region: Secondary | ICD-10-CM | POA: Diagnosis not present

## 2021-05-31 DIAGNOSIS — M542 Cervicalgia: Secondary | ICD-10-CM | POA: Diagnosis not present

## 2021-05-31 DIAGNOSIS — M2569 Stiffness of other specified joint, not elsewhere classified: Secondary | ICD-10-CM | POA: Diagnosis not present

## 2021-06-01 ENCOUNTER — Telehealth: Payer: Self-pay

## 2021-06-01 NOTE — Telephone Encounter (Signed)
Prior Authorization submitted and approved for VILAZODONE with [facility] ID# [id number] effective 05/30/2021-08/28/2038, PA# [id number]  Pt's dose starting at 10 mg then increase to 20 mg, according to approval it should cover both strengths.

## 2021-06-03 DIAGNOSIS — M542 Cervicalgia: Secondary | ICD-10-CM | POA: Diagnosis not present

## 2021-06-03 DIAGNOSIS — M2569 Stiffness of other specified joint, not elsewhere classified: Secondary | ICD-10-CM | POA: Diagnosis not present

## 2021-06-03 DIAGNOSIS — R531 Weakness: Secondary | ICD-10-CM | POA: Diagnosis not present

## 2021-06-03 DIAGNOSIS — M5412 Radiculopathy, cervical region: Secondary | ICD-10-CM | POA: Diagnosis not present

## 2021-06-09 DIAGNOSIS — M2569 Stiffness of other specified joint, not elsewhere classified: Secondary | ICD-10-CM | POA: Diagnosis not present

## 2021-06-09 DIAGNOSIS — M542 Cervicalgia: Secondary | ICD-10-CM | POA: Diagnosis not present

## 2021-06-09 DIAGNOSIS — R531 Weakness: Secondary | ICD-10-CM | POA: Diagnosis not present

## 2021-06-09 DIAGNOSIS — M5412 Radiculopathy, cervical region: Secondary | ICD-10-CM | POA: Diagnosis not present

## 2021-06-10 DIAGNOSIS — M2569 Stiffness of other specified joint, not elsewhere classified: Secondary | ICD-10-CM | POA: Diagnosis not present

## 2021-06-10 DIAGNOSIS — M5412 Radiculopathy, cervical region: Secondary | ICD-10-CM | POA: Diagnosis not present

## 2021-06-10 DIAGNOSIS — R531 Weakness: Secondary | ICD-10-CM | POA: Diagnosis not present

## 2021-06-10 DIAGNOSIS — M542 Cervicalgia: Secondary | ICD-10-CM | POA: Diagnosis not present

## 2021-06-16 DIAGNOSIS — M542 Cervicalgia: Secondary | ICD-10-CM | POA: Diagnosis not present

## 2021-06-16 DIAGNOSIS — M5412 Radiculopathy, cervical region: Secondary | ICD-10-CM | POA: Diagnosis not present

## 2021-06-16 DIAGNOSIS — R531 Weakness: Secondary | ICD-10-CM | POA: Diagnosis not present

## 2021-06-16 DIAGNOSIS — M2569 Stiffness of other specified joint, not elsewhere classified: Secondary | ICD-10-CM | POA: Diagnosis not present

## 2021-06-30 ENCOUNTER — Ambulatory Visit (INDEPENDENT_AMBULATORY_CARE_PROVIDER_SITE_OTHER): Payer: BC Managed Care – PPO | Admitting: Internal Medicine

## 2021-06-30 ENCOUNTER — Encounter: Payer: Self-pay | Admitting: Internal Medicine

## 2021-06-30 ENCOUNTER — Other Ambulatory Visit: Payer: Self-pay

## 2021-06-30 VITALS — BP 116/74 | HR 83 | Temp 98.2°F | Ht 73.0 in | Wt 195.0 lb

## 2021-06-30 DIAGNOSIS — M542 Cervicalgia: Secondary | ICD-10-CM | POA: Diagnosis not present

## 2021-06-30 DIAGNOSIS — J0100 Acute maxillary sinusitis, unspecified: Secondary | ICD-10-CM | POA: Diagnosis not present

## 2021-06-30 DIAGNOSIS — M5412 Radiculopathy, cervical region: Secondary | ICD-10-CM | POA: Diagnosis not present

## 2021-06-30 DIAGNOSIS — M2569 Stiffness of other specified joint, not elsewhere classified: Secondary | ICD-10-CM | POA: Diagnosis not present

## 2021-06-30 DIAGNOSIS — R7989 Other specified abnormal findings of blood chemistry: Secondary | ICD-10-CM

## 2021-06-30 DIAGNOSIS — D539 Nutritional anemia, unspecified: Secondary | ICD-10-CM | POA: Diagnosis not present

## 2021-06-30 DIAGNOSIS — R531 Weakness: Secondary | ICD-10-CM | POA: Diagnosis not present

## 2021-06-30 LAB — PROTIME-INR
INR: 1 ratio (ref 0.8–1.0)
Prothrombin Time: 10.7 s (ref 9.6–13.1)

## 2021-06-30 LAB — CBC WITH DIFFERENTIAL/PLATELET
Basophils Absolute: 0 10*3/uL (ref 0.0–0.1)
Basophils Relative: 0.5 % (ref 0.0–3.0)
Eosinophils Absolute: 0.1 10*3/uL (ref 0.0–0.7)
Eosinophils Relative: 1.9 % (ref 0.0–5.0)
HCT: 40.2 % (ref 39.0–52.0)
Hemoglobin: 13.8 g/dL (ref 13.0–17.0)
Lymphocytes Relative: 35.7 % (ref 12.0–46.0)
Lymphs Abs: 2.2 10*3/uL (ref 0.7–4.0)
MCHC: 34.3 g/dL (ref 30.0–36.0)
MCV: 95.6 fl (ref 78.0–100.0)
Monocytes Absolute: 0.5 10*3/uL (ref 0.1–1.0)
Monocytes Relative: 7.7 % (ref 3.0–12.0)
Neutro Abs: 3.3 10*3/uL (ref 1.4–7.7)
Neutrophils Relative %: 54.2 % (ref 43.0–77.0)
Platelets: 204 10*3/uL (ref 150.0–400.0)
RBC: 4.2 Mil/uL — ABNORMAL LOW (ref 4.22–5.81)
RDW: 13.1 % (ref 11.5–15.5)
WBC: 6.1 10*3/uL (ref 4.0–10.5)

## 2021-06-30 LAB — HEPATIC FUNCTION PANEL
ALT: 12 U/L (ref 0–53)
AST: 8 U/L (ref 0–37)
Albumin: 4.4 g/dL (ref 3.5–5.2)
Alkaline Phosphatase: 54 U/L (ref 39–117)
Bilirubin, Direct: 0.1 mg/dL (ref 0.0–0.3)
Total Bilirubin: 0.5 mg/dL (ref 0.2–1.2)
Total Protein: 6.7 g/dL (ref 6.0–8.3)

## 2021-06-30 LAB — IBC + FERRITIN
Ferritin: 152.3 ng/mL (ref 22.0–322.0)
Iron: 72 ug/dL (ref 42–165)
Saturation Ratios: 31 % (ref 20.0–50.0)
TIBC: 232.4 ug/dL — ABNORMAL LOW (ref 250.0–450.0)
Transferrin: 166 mg/dL — ABNORMAL LOW (ref 212.0–360.0)

## 2021-06-30 LAB — FOLATE: Folate: 10.4 ng/mL (ref >5.9)

## 2021-06-30 LAB — VITAMIN B12: Vitamin B-12: 257 pg/mL (ref 211–911)

## 2021-06-30 MED ORDER — CEFDINIR 300 MG PO CAPS
300.0000 mg | ORAL_CAPSULE | Freq: Two times a day (BID) | ORAL | 0 refills | Status: AC
Start: 2021-06-30 — End: 2021-07-10

## 2021-06-30 NOTE — Patient Instructions (Signed)
Anemia Anemia is a condition in which there is not enough red blood cells or hemoglobin in the blood. Hemoglobin is a substance in red blood cells that carries oxygen. When you do not have enough red blood cells or hemoglobin (are anemic), your body cannot get enough oxygen and your organs may not work properly. As a result, you may feel very tired or have other problems. What are the causes? Common causes of anemia include: Excessive bleeding. Anemia can be caused by excessive bleeding inside or outside the body, including bleeding from the intestines or from heavy menstrual periods in females. Poor nutrition. Long-lasting (chronic) kidney, thyroid, and liver disease. Bone marrow disorders, spleen problems, and blood disorders. Cancer and treatments for cancer. HIV (human immunodeficiency virus) and AIDS (acquired immunodeficiency syndrome). Infections, medicines, and autoimmune disorders that destroy red blood cells. What are the signs or symptoms? Symptoms of this condition include: Minor weakness. Dizziness. Headache, or difficulties concentrating and sleeping. Heartbeats that feel irregular or faster than normal (palpitations). Shortness of breath, especially with exercise. Pale skin, lips, and nails, or cold hands and feet. Indigestion and nausea. Symptoms may occur suddenly or develop slowly. If your anemia is mild, you may not have symptoms. How is this diagnosed? This condition is diagnosed based on blood tests, your medical history, and a physical exam. In some cases, a test may be needed in which cells are removed from the soft tissue inside of a bone and looked at under a microscope (bone marrow biopsy). Your health care provider may also check your stool (feces) for blood and may do additional testing to look for the cause of your bleeding. Other tests may include: Imaging tests, such as a CT scan or MRI. A procedure to see inside your esophagus and stomach (endoscopy). A  procedure to see inside your colon and rectum (colonoscopy). How is this treated? Treatment for this condition depends on the cause. If you continue to lose a lot of blood, you may need to be treated at a hospital. Treatment may include: Taking supplements of iron, vitamin B12, or folic acid. Taking a hormone medicine (erythropoietin) that can help to stimulate red blood cell growth. Having a blood transfusion. This may be needed if you lose a lot of blood. Making changes to your diet. Having surgery to remove your spleen. Follow these instructions at home: Take over-the-counter and prescription medicines only as told by your health care provider. Take supplements only as told by your health care provider. Follow any diet instructions that you were given by your health care provider. Keep all follow-up visits as told by your health care provider. This is important. Contact a health care provider if: You develop new bleeding anywhere in the body. Get help right away if: You are very weak. You are short of breath. You have pain in your abdomen or chest. You are dizzy or feel faint. You have trouble concentrating. You have bloody stools, black stools, or tarry stools. You vomit repeatedly or you vomit up blood. These symptoms may represent a serious problem that is an emergency. Do not wait to see if the symptoms will go away. Get medical help right away. Call your local emergency services (911 in the [place] Do not drive yourself to the hospital. Summary Anemia is a condition in which you do not have enough red blood cells or enough of a substance in your red blood cells that carries oxygen (hemoglobin). Symptoms may occur suddenly or develop slowly. If your anemia is   mild, you may not have symptoms. This condition is diagnosed with blood tests, a medical history, and a physical exam. Other tests may be needed. Treatment for this condition depends on the cause of the anemia. This  information is not intended to replace advice given to you by your health care provider. Make sure you discuss any questions you have with your health care provider. Document Revised: 07/23/2019 Document Reviewed: 07/23/2019 Elsevier Patient Education  2022 Elsevier Inc.  

## 2021-06-30 NOTE — Progress Notes (Signed)
Subjective:  Patient ID: Shawn York, male    DOB: 1975/12/02  Age: 45 y.o. MRN: [id number]  CC: Hypertension, Anemia, and Sinusitis  This visit occurred during the SARS-CoV-2 public health emergency.  Safety protocols were in place, including screening questions prior to the visit, additional usage of staff PPE, and extensive cleaning of exam room while observing appropriate contact time as indicated for disinfecting solutions.    HPI Shawn York presents for f/up regarding anemia and elevated LFTs.  He denies blood loss, fatigue, paresthesias, abd pain, fatigue.  Outpatient Medications Prior to Visit  Medication Sig Dispense Refill   benazepril (LOTENSIN) 10 MG tablet Take 1 tablet (10 mg total) by mouth at bedtime. 90 tablet 1   busPIRone (BUSPAR) 30 MG tablet Take 1 tablet (30 mg total) by mouth 2 (two) times daily. 180 tablet 1   doxazosin (CARDURA) 4 MG tablet Take 1 tablet (4 mg total) by mouth at bedtime. 90 tablet 1   EPINEPHrine 0.3 mg/0.3 mL IJ SOAJ injection Inject 0.3 mg into the muscle once as needed (for a severe, allergic reaction).      hydrOXYzine (ATARAX/VISTARIL) 25 MG tablet Take 1 tablet (25 mg total) by mouth at bedtime as needed for anxiety. 30 tablet 2   Oxcarbazepine (TRILEPTAL) 300 MG tablet Take 1 tablet (300 mg total) by mouth 2 (two) times daily. 60 tablet 0   vortioxetine HBr (TRINTELLIX) 10 MG TABS tablet Decrease to 10 mg tablet daily for one week, then stop 7 tablet 0   cetirizine-pseudoephedrine (ZYRTEC-D) 5-120 MG tablet Take 1 tablet by mouth 2 (two) times daily as needed for allergies or rhinitis.     Vilazodone HCl (VIIBRYD) 10 MG TABS Take 1/2 tablet daily in the morning with food for one week, then increase 1 tablet daily for one week, then increase to 2 tablets daily 30 tablet 0   Vilazodone HCl 20 MG TABS Take 1 tablet (20 mg total) by mouth daily with breakfast. 30 tablet 1   No facility-administered medications prior to visit.    ROS Review  of Systems  Constitutional:  Negative for diaphoresis, fatigue and unexpected weight change.  HENT:  Positive for rhinorrhea. Negative for facial swelling, nosebleeds, sinus pressure, sinus pain, sore throat and trouble swallowing.        One month hx of thick/green nasal phelgm.  Eyes: Negative.   Respiratory:  Negative for cough, chest tightness, shortness of breath and wheezing.   Cardiovascular:  Negative for chest pain, palpitations and leg swelling.  Gastrointestinal:  Negative for abdominal pain, diarrhea, nausea and vomiting.  Endocrine: Negative.   Genitourinary: Negative.  Negative for difficulty urinating.  Musculoskeletal: Negative.  Negative for arthralgias.  Skin: Negative.  Negative for color change and pallor.  Neurological:  Negative for dizziness, weakness, light-headedness and headaches.  Hematological:  Negative for adenopathy. Does not bruise/bleed easily.  Psychiatric/Behavioral: Negative.     Objective:  BP 116/74 (BP Location: Right Arm, Patient Position: Sitting, Cuff Size: Large)   Pulse 83   Temp 98.2 F (36.8 C) (Oral)   Ht 6\' 1"  (1.854 m)   Wt 195 lb (88.5 kg)   SpO2 97%   BMI 25.73 kg/m   BP Readings from Last 3 Encounters:  06/30/21 116/74  05/17/21 124/78  03/18/21 128/80    Wt Readings from Last 3 Encounters:  06/30/21 195 lb (88.5 kg)  05/17/21 195 lb (88.5 kg)  03/18/21 197 lb 12.8 oz (89.7 kg)    Physical Exam  Vitals reviewed.  Constitutional:      Appearance: Normal appearance.  HENT:     Mouth/Throat:     Mouth: Mucous membranes are moist.  Eyes:     General: No scleral icterus.    Conjunctiva/sclera: Conjunctivae normal.  Cardiovascular:     Rate and Rhythm: Normal rate and regular rhythm.     Heart sounds: No murmur heard. Pulmonary:     Effort: Pulmonary effort is normal.     Breath sounds: No stridor. No wheezing, rhonchi or rales.  Abdominal:     General: Abdomen is flat.     Palpations: There is no mass.      Tenderness: There is no abdominal tenderness. There is no guarding or rebound.     Hernia: No hernia is present.  Musculoskeletal:        General: Normal range of motion.     Cervical back: Neck supple.     Right lower leg: No edema.     Left lower leg: No edema.  Lymphadenopathy:     Cervical: No cervical adenopathy.  Skin:    General: Skin is warm and dry.     Coloration: Skin is not pale.  Neurological:     General: No focal deficit present.     Mental Status: He is alert.  Psychiatric:        Mood and Affect: Mood normal.        Behavior: Behavior normal.    Lab Results  Component Value Date   WBC 6.1 06/30/2021   HGB 13.8 06/30/2021   HCT 40.2 06/30/2021   PLT 204.0 06/30/2021   GLUCOSE 87 05/17/2021   CHOL 213 (H) 05/17/2021   TRIG 271.0 (H) 05/17/2021   HDL 53.60 05/17/2021   LDLDIRECT 127.0 05/17/2021   ALT 12 06/30/2021   AST 8 06/30/2021   NA 137 05/17/2021   K 4.2 05/17/2021   CL 101 05/17/2021   CREATININE 1.04 05/17/2021   BUN 11 05/17/2021   CO2 30 05/17/2021   TSH 1.37 05/17/2021   INR 1.0 06/30/2021    DG Shoulder Left  Result Date: 02/19/2021 CLINICAL DATA:  Left shoulder pain EXAM: LEFT SHOULDER - 2+ VIEW COMPARISON:  None. FINDINGS: There is no evidence of fracture or dislocation. There is no evidence of arthropathy or other focal bone abnormality. Soft tissues are unremarkable. IMPRESSION: Negative. Electronically Signed   By: Fidela Salisbury MD   On: 02/19/2021 22:55    Assessment & Plan:   Shawn York was seen today for hypertension, anemia and sinusitis.  Diagnoses and all orders for this visit:  Elevated LFTs- LFT's are normal now. Will screen for Hep A and B. Recent Hep C ab was negative. -     Protime-INR; Future -     Hepatic function panel; Future -     Hepatitis B surface antibody,quantitative; Future -     Hepatitis B surface antigen; Future -     Hepatitis A antibody, total; Future -     Hepatitis A antibody, total -     Hepatitis B  surface antigen -     Hepatitis B surface antibody,quantitative -     Hepatic function panel -     Protime-INR  Deficiency anemia- His H/H are normal now. Will screen for vitamin deficiencies. -     IBC + Ferritin; Future -     Vitamin B12; Future -     Reticulocytes; Future -     CBC with Differential/Platelet; Future -  Vitamin B1; Future -     Folate; Future -     Folate -     Vitamin B1 -     CBC with Differential/Platelet -     Reticulocytes -     Vitamin B12 -     IBC + Ferritin  Acute non-recurrent maxillary sinusitis -     cefdinir (OMNICEF) 300 MG capsule; Take 1 capsule (300 mg total) by mouth 2 (two) times daily for 10 days.  I have discontinued Termaine Babineaux's cetirizine-pseudoephedrine, Vilazodone HCl, and Vilazodone HCl. I am also having him start on cefdinir. Additionally, I am having him maintain his EPINEPHrine, Oxcarbazepine, benazepril, busPIRone, doxazosin, hydrOXYzine, and vortioxetine HBr.  Meds ordered this encounter  Medications   cefdinir (OMNICEF) 300 MG capsule    Sig: Take 1 capsule (300 mg total) by mouth 2 (two) times daily for 10 days.    Dispense:  20 capsule    Refill:  0      Follow-up: Return in about 3 months (around 09/30/2021).  Scarlette Calico, MD

## 2021-07-01 DIAGNOSIS — M2569 Stiffness of other specified joint, not elsewhere classified: Secondary | ICD-10-CM | POA: Diagnosis not present

## 2021-07-01 DIAGNOSIS — M542 Cervicalgia: Secondary | ICD-10-CM | POA: Diagnosis not present

## 2021-07-01 DIAGNOSIS — R531 Weakness: Secondary | ICD-10-CM | POA: Diagnosis not present

## 2021-07-01 DIAGNOSIS — M5412 Radiculopathy, cervical region: Secondary | ICD-10-CM | POA: Diagnosis not present

## 2021-07-04 LAB — RETICULOCYTES
ABS Retic: 41400 cells/uL (ref 25000–90000)
Retic Ct Pct: 1 %

## 2021-07-04 LAB — HEPATITIS B SURFACE ANTIGEN: Hepatitis B Surface Ag: NONREACTIVE

## 2021-07-04 LAB — HEPATITIS B SURFACE ANTIBODY, QUANTITATIVE: Hep B S AB Quant (Post): <5 m[IU]/mL — ABNORMAL LOW (ref >=10)

## 2021-07-04 LAB — HEPATITIS A ANTIBODY, TOTAL: Hepatitis A AB,Total: NONREACTIVE

## 2021-07-04 LAB — VITAMIN B1: Vitamin B1 (Thiamine): 13 nmol/L (ref 8–30)

## 2021-07-08 ENCOUNTER — Ambulatory Visit: Payer: BC Managed Care – PPO | Admitting: Psychiatry

## 2021-07-15 DIAGNOSIS — Z01818 Encounter for other preprocedural examination: Secondary | ICD-10-CM | POA: Diagnosis not present

## 2021-07-15 DIAGNOSIS — M5412 Radiculopathy, cervical region: Secondary | ICD-10-CM | POA: Diagnosis not present

## 2021-07-16 DIAGNOSIS — M2578 Osteophyte, vertebrae: Secondary | ICD-10-CM | POA: Diagnosis not present

## 2021-07-16 DIAGNOSIS — M4722 Other spondylosis with radiculopathy, cervical region: Secondary | ICD-10-CM | POA: Diagnosis not present

## 2021-07-16 DIAGNOSIS — M4302 Spondylolysis, cervical region: Secondary | ICD-10-CM | POA: Diagnosis not present

## 2021-08-31 DIAGNOSIS — M5412 Radiculopathy, cervical region: Secondary | ICD-10-CM | POA: Diagnosis not present

## 2021-09-07 ENCOUNTER — Telehealth: Payer: Self-pay | Admitting: Internal Medicine

## 2021-09-07 ENCOUNTER — Other Ambulatory Visit: Payer: Self-pay | Admitting: Internal Medicine

## 2021-09-07 DIAGNOSIS — A09 Infectious gastroenteritis and colitis, unspecified: Secondary | ICD-10-CM | POA: Insufficient documentation

## 2021-09-07 MED ORDER — CIPROFLOXACIN HCL 500 MG PO TABS: 500.0000 mg | ORAL_TABLET | Freq: Two times a day (BID) | ORAL | 0 refills | Status: AC

## 2021-09-07 NOTE — Telephone Encounter (Signed)
Patient states he is going out of the country this week and would like to request a rx for cipro  Please advise

## 2021-09-27 ENCOUNTER — Telehealth: Payer: Self-pay | Admitting: Psychiatry

## 2021-09-27 NOTE — Telephone Encounter (Signed)
Is it okay to schedule him again with you? Last appt 2021.

## 2021-09-28 NOTE — Telephone Encounter (Signed)
Pt is scheduled   Thanks~

## 2021-10-10 NOTE — Progress Notes (Signed)
Subjective:    Patient ID: Shawn York, male    DOB: 10/13/1975, 46 y.o.   MRN: [id number]  This visit occurred during the SARS-CoV-2 public health emergency.  Safety protocols were in place, including screening questions prior to the visit, additional usage of staff PPE, and extensive cleaning of exam room while observing appropriate contact time as indicated for disinfecting solutions.    HPI The patient is here for an acute visit.   Right hip pain x 3 weeks -it started shortly after coming back from vacation and doing a lot of hiking.  He denies any injury while on vacation.  He has pain when he moves a certain way in the right groin that feels like it is in the joint.  He notices it when his foot is planted and he turns toward the opposite side or with stepping sideways.   He also has pain in the right lower back which extends down the hamstring.  He feels that when he stands or walks.  Sitting for more than one hour causes pain.  No n/t in leg.  He has had some back issues in the past, but not quite like this.  He recently had neck surgery for radiculopathy down the left upper extremity.  He does have some joint pain that comes and goes.  Now he knows he has degenerative disc disease.  He does have psoriasis and wonders a little bit about psoriatic arthritis.   Not taking anything for pain    Medications and allergies reviewed with patient and updated if appropriate.  Patient Active Problem List   Diagnosis Date Noted   Traveler's diarrhea 09/07/2021   Acute non-recurrent maxillary sinusitis 06/30/2021   Deficiency anemia 05/18/2021   Elevated LFTs 05/18/2021   Primary hypertension 05/17/2021   Encounter for general adult medical examination with abnormal findings 05/17/2021   Need for hepatitis C screening test 05/17/2021   PTSD (post-traumatic stress disorder) 06/05/2018   Nightmares associated with chronic post-traumatic stress disorder 09/27/2017   Brain tumor, glioma  ([facility] [facility]) 09/27/2017   Severe episode of recurrent major depressive disorder, without psychotic features ([place])    Major depressive disorder, recurrent episode ([place]) 08/17/2016   Hypertension 06/21/2013   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 90 tablet 1   doxazosin (CARDURA) 4 MG tablet Take 1 tablet (4 mg total) by mouth at bedtime. 90 tablet 1   EPINEPHrine 0.3 mg/0.3 mL IJ SOAJ injection Inject 0.3 mg into the muscle once as needed (for a severe, allergic reaction).      hydrOXYzine (ATARAX/VISTARIL) 25 MG tablet Take 1 tablet (25 mg total) by mouth at bedtime as needed for anxiety. 30 tablet 2   Oxcarbazepine (TRILEPTAL) 300 MG tablet Take 1 tablet (300 mg total) by mouth 2 (two) times daily. 60 tablet 0   vortioxetine HBr (TRINTELLIX) 10 MG TABS tablet Decrease to 10 mg tablet daily for one week, then stop 7 tablet 0   busPIRone (BUSPAR) 30 MG tablet Take 1 tablet (30 mg total) by mouth 2 (two) times daily. 180 tablet 1   No current facility-administered medications on file prior to visit.    Past Medical History:  Diagnosis Date   Brain cancer ([place]) 02/25/2011   grade III anaplastic ologodendrglioma   Hypertension  PTSD (post-traumatic stress disorder)    Seizures ([facility])     Past Surgical History:  Procedure Laterality Date   BASAL CELL CARCINOMA EXCISION  1995   CRANIOTOMY FOR TUMOR Right 02/25/2011   WISDOM TOOTH EXTRACTION      Social History   Socioeconomic History   Marital status: Single    Spouse name: Not on file   Number of children: Not on file   Years of education: Not on file   Highest education level: Not on file  Occupational History   Not on file  Tobacco Use   Smoking status: Former    Types: Cigarettes    Quit date: 02/25/2011    Years since quitting:  10.6   Smokeless tobacco: Never  Substance and Sexual Activity   Alcohol use: Yes    Alcohol/week: 14.0 standard drinks    Types: 14 Cans of beer per week   Drug use: No   Sexual activity: Yes    Comment: E-cigarette users  Other Topics Concern   Not on file  Social History Narrative   Not on file   Social Determinants of Health   Financial Resource Strain: Not on file  Food Insecurity: Not on file  Transportation Needs: Not on file  Physical Activity: Not on file  Stress: Not on file  Social Connections: Not on file    Family History  Problem Relation Age of Onset   Cancer Other        brain cancer   Cancer Father        skin cancer   Cancer Maternal Grandmother 72       brain cancer   Alcohol abuse Brother    Alzheimer's disease Paternal Grandmother    Dementia Paternal Grandmother    Heart disease Paternal Grandfather     Review of Systems     Objective:   Vitals:   10/12/21 1024  BP: 120/82  Pulse: 71  Temp: 98.4 F (36.9 C)  SpO2: 99%   BP Readings from Last 3 Encounters:  10/12/21 120/82  06/30/21 116/74  05/17/21 124/78   Wt Readings from Last 3 Encounters:  10/12/21 202 lb 3.2 oz (91.7 kg)  06/30/21 195 lb (88.5 kg)  05/17/21 195 lb (88.5 kg)   Body mass index is 26.68 kg/m.   Physical Exam Constitutional:      General: He is not in acute distress.    Appearance: Normal appearance. He is not ill-appearing.  HENT:     Head: Normocephalic and atraumatic.  Musculoskeletal:        General: Tenderness (Tenderness with palpation right SI joint and lower back, no tenderness with palpation in right groin, right lateral hip) present.     Right lower leg: No edema.     Left lower leg: No edema.  Skin:    General: Skin is warm and dry.  Neurological:     Mental Status: He is alert.     Sensory: No sensory deficit.     Motor: No weakness.           Assessment & Plan:    Right groin pain: Acute Started 3 weeks ago Has pain mostly  with certain movements, twisting of the hip was stepping sideways No obvious injury ?  Arthritis, impingement, etc. X-rays today Refer to sports medicine   Right lower back pain: Acute Pain starts in right lower back and radiates down hamstring ?  Radiculopathy, SI joint pain Refer to sports medicine for further evaluation  and treatment

## 2021-10-12 ENCOUNTER — Ambulatory Visit (INDEPENDENT_AMBULATORY_CARE_PROVIDER_SITE_OTHER): Payer: BC Managed Care – PPO

## 2021-10-12 ENCOUNTER — Ambulatory Visit (INDEPENDENT_AMBULATORY_CARE_PROVIDER_SITE_OTHER): Payer: BC Managed Care – PPO | Admitting: Internal Medicine

## 2021-10-12 ENCOUNTER — Other Ambulatory Visit: Payer: Self-pay

## 2021-10-12 ENCOUNTER — Encounter: Payer: Self-pay | Admitting: Internal Medicine

## 2021-10-12 VITALS — BP 120/82 | HR 71 | Temp 98.4°F | Ht 73.0 in | Wt 202.2 lb

## 2021-10-12 DIAGNOSIS — M545 Low back pain, unspecified: Secondary | ICD-10-CM

## 2021-10-12 DIAGNOSIS — R1031 Right lower quadrant pain: Secondary | ICD-10-CM | POA: Diagnosis not present

## 2021-10-12 NOTE — Patient Instructions (Addendum)
° ° °  Have xrays down stairs.     Medications changes include :   none    A referral was ordered for sports medicine      Someone from their office will call you to schedule an appointment.
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
## 2021-10-14 ENCOUNTER — Ambulatory Visit (INDEPENDENT_AMBULATORY_CARE_PROVIDER_SITE_OTHER): Payer: BC Managed Care – PPO | Admitting: Sports Medicine

## 2021-10-14 ENCOUNTER — Other Ambulatory Visit: Payer: Self-pay

## 2021-10-14 VITALS — BP 140/80 | HR 77 | Ht 73.0 in | Wt 202.0 lb

## 2021-10-14 DIAGNOSIS — M25551 Pain in right hip: Secondary | ICD-10-CM | POA: Diagnosis not present

## 2021-10-14 DIAGNOSIS — M87051 Idiopathic aseptic necrosis of right femur: Secondary | ICD-10-CM | POA: Diagnosis not present

## 2021-10-14 DIAGNOSIS — M87052 Idiopathic aseptic necrosis of left femur: Secondary | ICD-10-CM | POA: Diagnosis not present

## 2021-10-14 NOTE — Patient Instructions (Addendum)
Good to see you  MRI referral  Tylenol [phone] mg 2-3 times a day for pain relief  Lets follow up 3 days after your MRI call our clinic to schedule an appointment

## 2021-10-23 ENCOUNTER — Ambulatory Visit (INDEPENDENT_AMBULATORY_CARE_PROVIDER_SITE_OTHER): Payer: BC Managed Care – PPO

## 2021-10-23 ENCOUNTER — Other Ambulatory Visit: Payer: Self-pay

## 2021-10-23 DIAGNOSIS — M87052 Idiopathic aseptic necrosis of left femur: Secondary | ICD-10-CM | POA: Diagnosis not present

## 2021-10-23 DIAGNOSIS — M87051 Idiopathic aseptic necrosis of right femur: Secondary | ICD-10-CM
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
## 2021-10-26 ENCOUNTER — Ambulatory Visit (INDEPENDENT_AMBULATORY_CARE_PROVIDER_SITE_OTHER): Payer: BC Managed Care – PPO | Admitting: Sports Medicine

## 2021-10-26 ENCOUNTER — Other Ambulatory Visit: Payer: Self-pay

## 2021-10-26 VITALS — BP 120/80 | HR 74 | Ht 73.0 in | Wt 199.0 lb

## 2021-10-26 DIAGNOSIS — M87051 Idiopathic aseptic necrosis of right femur: Secondary | ICD-10-CM | POA: Diagnosis not present

## 2021-10-26 DIAGNOSIS — M87052 Idiopathic aseptic necrosis of left femur: Secondary | ICD-10-CM

## 2021-10-26 DIAGNOSIS — M25551 Pain in right hip: Secondary | ICD-10-CM | POA: Diagnosis not present

## 2021-10-26 NOTE — Patient Instructions (Addendum)
Good to see you  Orthopedic referral to discuss bilaterally necrosis of the hip  Follow up as needed

## 2021-10-27 ENCOUNTER — Encounter: Payer: Self-pay | Admitting: Sports Medicine

## 2021-11-02 ENCOUNTER — Ambulatory Visit: Payer: Self-pay

## 2021-11-02 ENCOUNTER — Other Ambulatory Visit: Payer: Self-pay

## 2021-11-02 ENCOUNTER — Ambulatory Visit (INDEPENDENT_AMBULATORY_CARE_PROVIDER_SITE_OTHER): Payer: BC Managed Care – PPO | Admitting: Orthopaedic Surgery

## 2021-11-02 DIAGNOSIS — M87052 Idiopathic aseptic necrosis of left femur: Secondary | ICD-10-CM

## 2021-11-02 DIAGNOSIS — M87051 Idiopathic aseptic necrosis of right femur: Secondary | ICD-10-CM

## 2021-11-02 NOTE — Progress Notes (Signed)
? ?Office Visit Note ?  ?Patient: Shawn York           ?Date of Birth: 1976-04-27           ?MRN: [id number] ?Visit Date: 11/02/2021 ?             ?Requested by: Glennon Mac, DO ?[address] ?[place],  [place] [address] ?PCP: Janith Lima, MD ? ? ?Assessment & Plan: ?Visit Diagnoses:  ?1. Avascular necrosis of bone of right hip ([place])   ?2. Avascular necrosis of bone of left hip ([place])   ? ? ?Plan: Impression is bilateral femoral head avascular necrosis.  We reviewed the studies today in detail which do show flattening of the femoral head.  There is edema within the femoral heads as well.  Treatment options were reviewed to include trial of bisphosphonates, pain medications, cortisone injection, total hip replacement and the pros and cons to each option.  Based on his options he has elected to move forward with a right total hip replacement in the near future.  Risk benefits rehab recovery prognosis reviewed with the patient detail.  They will meet with Debbie today.  We will get preoperative medical clearance from Dr. Ronnald Ramp. ? ?Follow-Up Instructions: No follow-ups on file.  ? ?Orders:  ?Orders Placed This Encounter  ?Procedures  ? XR Pelvis 1-2 Views  ? ?No orders of the defined types were placed in this encounter. ? ? ? ? Procedures: ?No procedures performed ? ? ?Clinical Data: ?No additional findings. ? ? ?Subjective: ?Chief Complaint  ?Patient presents with  ? Left Hip - Pain  ? Right Hip - Pain  ? ? ?HPI ? ?Stat is a very pleasant 46 year old gentleman here with his wife today for bilateral hip AVN was recently found on MRI that was ordered by Dr. Glennon Mac due to chronic bilateral hip and groin pain.  Shawn York works at [employer] and is on his feet all day.  He has pain with any sitting or standing and is worse with increased activity.  The pain is 8 out of 10 at baseline.  He does have a history of malignant brain cancer and was on chronic steroids.  He also reports history of heavy alcohol use.   Denies any back pain or radicular symptoms. ? ?Review of Systems  ?Constitutional: Negative.   ?All other systems reviewed and are negative. ? ? ?Objective: ?Vital Signs: There were no vitals taken for this visit. ? ?Physical Exam ?Vitals and nursing note reviewed.  ?Constitutional:   ?   Appearance: He is well-developed.  ?HENT:  ?   Head: Normocephalic and atraumatic.  ?Eyes:  ?   Pupils: Pupils are equal, round, and reactive to light.  ?Pulmonary:  ?   Effort: Pulmonary effort is normal.  ?Abdominal:  ?   Palpations: Abdomen is soft.  ?Musculoskeletal:     ?   General: Normal range of motion.  ?   Cervical back: Neck supple.  ?Skin: ?   General: Skin is warm.  ?Neurological:  ?   Mental Status: He is alert and oriented to person, place, and time.  ?Psychiatric:     ?   Behavior: Behavior normal.     ?   Thought Content: Thought content normal.     ?   Judgment: Judgment normal.  ? ? ?Ortho Exam ? ?Examination of bilateral hips show mild limitation range of motion.  He has a decent amount of pain with internal rotation of the right hip  and with flexion past 90 degrees.  Left hip is relatively asymptomatic.  He has pain in the hips with percussion of the heels. ? ?Specialty Comments:  ?No specialty comments available. ? ?Imaging: ?XR Pelvis 1-2 Views ? ?Result Date: 11/02/2021 ?Mild flattening of the bilateral femoral heads and sclerotic lesions consistent with avascular necrosis.  ? ? ?PMFS History: ?Patient Active Problem List  ? Diagnosis Date Noted  ? Avascular necrosis of bone of right hip ([place]) 11/02/2021  ? Avascular necrosis of bone of left hip ([place]) 11/02/2021  ? Traveler's diarrhea 09/07/2021  ? Acute non-recurrent maxillary sinusitis 06/30/2021  ? Deficiency anemia 05/18/2021  ? Elevated LFTs 05/18/2021  ? Primary hypertension 05/17/2021  ? Encounter for general adult medical examination with abnormal findings 05/17/2021  ? Need for hepatitis C screening test 05/17/2021  ? PTSD (post-traumatic stress  disorder) 06/05/2018  ? Nightmares associated with chronic post-traumatic stress disorder 09/27/2017  ? Brain tumor, glioma ([place]) 09/27/2017  ? Severe episode of recurrent major depressive disorder, without psychotic features ([place])   ? Major depressive disorder, recurrent episode ([place]) 08/17/2016  ? Hypertension 06/21/2013  ? 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 ?Past Medical History:  ?Diagnosis Date  ? Brain cancer ([place]) 02/25/2011  ? grade III anaplastic ologodendrglioma  ? Hypertension   ? PTSD (post-traumatic stress disorder)   ? Seizures ([place])   ?  ?Family History  ?Problem Relation Age of Onset  ? Cancer Other   ?     brain cancer  ? Cancer Father   ?     skin cancer  ? Cancer Maternal Grandmother 13  ?     brain cancer  ? Alcohol abuse Brother   ? Alzheimer's disease Paternal Grandmother   ? Dementia Paternal Grandmother   ? Heart disease Paternal Grandfather   ?  ?Past Surgical History:  ?Procedure Laterality Date  ? BASAL CELL CARCINOMA EXCISION  1995  ? CRANIOTOMY FOR TUMOR Right 02/25/2011  ? WISDOM TOOTH EXTRACTION    ? ?Social History  ? ?Occupational History  ? Not on file  ?Tobacco Use  ? Smoking status: Former  ?  Types: Cigarettes  ?  Quit date: 02/25/2011  ?  Years since quitting: 10.6  ? Smokeless tobacco: Never  ?Substance and Sexual Activity  ? Alcohol use: Yes  ?  Alcohol/week: 14.0 standard drinks  ?  Types: 14 Cans of beer per week  ? Drug use: No  ? Sexual activity: Yes  ?  Comment: E-cigarette users  ? ? ? ? ? ? ?

## 2021-11-08 ENCOUNTER — Other Ambulatory Visit: Payer: Self-pay

## 2021-11-08 ENCOUNTER — Encounter: Payer: Self-pay | Admitting: Internal Medicine

## 2021-11-08 ENCOUNTER — Ambulatory Visit (INDEPENDENT_AMBULATORY_CARE_PROVIDER_SITE_OTHER): Payer: BC Managed Care – PPO | Admitting: Internal Medicine

## 2021-11-08 ENCOUNTER — Telehealth: Payer: Self-pay | Admitting: Internal Medicine

## 2021-11-08 VITALS — BP 126/76 | HR 82 | Temp 98.1°F | Ht 73.0 in | Wt 197.0 lb

## 2021-11-08 DIAGNOSIS — J301 Allergic rhinitis due to pollen: Secondary | ICD-10-CM

## 2021-11-08 DIAGNOSIS — Z91041 Radiographic dye allergy status: Secondary | ICD-10-CM | POA: Diagnosis not present

## 2021-11-08 DIAGNOSIS — I10 Essential (primary) hypertension: Secondary | ICD-10-CM | POA: Diagnosis not present

## 2021-11-08 DIAGNOSIS — Z8249 Family history of ischemic heart disease and other diseases of the circulatory system: Secondary | ICD-10-CM | POA: Diagnosis not present

## 2021-11-08 DIAGNOSIS — R040 Epistaxis: Secondary | ICD-10-CM

## 2021-11-08 MED ORDER — PREDNISONE 50 MG PO TABS
ORAL_TABLET | ORAL | 0 refills | Status: DC
Start: 2021-11-08 — End: 2022-01-14

## 2021-11-08 MED ORDER — DIPHENHYDRAMINE HCL 25 MG PO TABS: ORAL_TABLET | ORAL | 0 refills | Status: DC

## 2021-11-08 MED ORDER — LEVOCETIRIZINE DIHYDROCHLORIDE 5 MG PO TABS: 5.0000 mg | ORAL_TABLET | Freq: Every evening | ORAL | 1 refills | Status: DC

## 2021-11-08 NOTE — Patient Instructions (Signed)
Nosebleed, Adult A nosebleed is when blood comes out of the nose. Nosebleeds are common. Usually, they are not a sign of a serious condition. Nosebleeds can happen if a blood vessel in your nose starts to bleed or if the lining of your nose (mucous membrane) cracks. They are commonly caused by: Allergies. Colds. Picking your nose. Blowing your nose too hard. An injury from sticking an object into your nose or getting hit in the nose. Dry or cold air. Less common causes of nosebleeds include: Toxic fumes. Something abnormal in the nose or in the air-filled spaces in the bones of the face (sinuses). Growths in the nose, such as polyps. Blood thinners or conditions that cause blood to clot slowly. Certain illnesses or procedures that irritate or dry out the nasal passages. Follow these instructions at home: When you have a nosebleed:  Sit down and tilt your head slightly forward. Use a clean towel or tissue to pinch your nostrils under the bony part of your nose. After 5 minutes, let go of your nose and see if bleeding starts again. Do not release pressure before that time. If there is still bleeding, repeat the pinching and holding for 5 minutes or until the bleeding stops. Do not place tissues or gauze in the nose to stop the bleeding. Avoid lying down and avoid tilting your head backward. That may make blood collect in the throat and cause gagging or coughing. Use a nasal spray decongestant to help with a nosebleed as told by your health care provider. After a nosebleed: Avoid blowing your nose or sniffing for a number of hours. Avoid straining, lifting, or bending at the waist for several days. You may go back to other normal activities as you are able. If you are taking aspirin or blood thinners and you have nosebleeds, talk to your health care provider. These medicines make bleeding more likely. Ask your health care provider if you should stop taking the medicines or if you should  adjust the dose. Do not stop taking medicines that your health care provider has recommended unless he or she tells you to stop taking them. If your nosebleed was caused by dry mucous membranes, use over-the-counter saline nasal spray or gel and a humidifier as told by your health care provider. This will keep the mucous membranes moist and allow them to heal. If you need to use one of these products: Choose one that is water-soluble. Use only as much as you need and use it only as often as needed. Do not lie down right after you use it. If you get nosebleeds often, talk with your health care provider about medical treatments. Options may include: Nasal cautery. This treatment stops and prevents nosebleeds by using a chemical swab or electrical device to lightly burn tiny blood vessels inside the nose. Nasal packing. A gauze or other material is placed in the nose to keep constant pressure on the bleeding area. Contact a health care provider if you: Have a fever. Get nosebleeds often or more often than usual. Bruise very easily. Have a nosebleed from having something stuck in your nose. Have bleeding in your mouth. Vomit or cough up brown material. Have a nosebleed after you start a new medicine. Get help right away if: You have a nosebleed after a fall or a head injury. Your nosebleed does not go away after 20 minutes. You feel dizzy or weak. You have unusual bleeding from other parts of your body. You have unusual bruising on   other parts of your body. You become sweaty. You vomit blood. Summary A nosebleed is when blood comes out of the nose. Common causes include allergies, an injury to the nose, or cold or dry air. Initial treatment includes applying pressure for 5 minutes. Moisturizing the nose with saline nasal spray or gel after a nosebleed may help prevent future bleeding. Get help right away if your nosebleed does not go away after 20 minutes. This information is not intended  to replace advice given to you by your health care provider. Make sure you discuss any questions you have with your health care provider. Document Revised: 06/13/2019 Document Reviewed: 06/13/2019 Elsevier Patient Education  2022 [employer]  

## 2021-11-08 NOTE — Telephone Encounter (Signed)
Connected to [facility] 3.13.2023. ? ?Caller states he has had a couple nose bleeds this morning. He is currently bleeding now passed a clot. It has been bleeding for 30 minutes. Was not holding pressure. Advise to start holding pressure on his nose. ? ? ?Advised to see PCP within 24 hours.  ?

## 2021-11-08 NOTE — Progress Notes (Signed)
Subjective:  Patient ID: Shawn York, male    DOB: 02/22/1976  Age: 46 y.o. MRN: [id number]  CC: Epistaxis  This visit occurred during the SARS-CoV-2 public health emergency.  Safety protocols were in place, including screening questions prior to the visit, additional usage of staff PPE, and extensive cleaning of exam room while observing appropriate contact time as indicated for disinfecting solutions.    HPI Shawn York presents for f/up -  He had a brief episode of left nosebleed earlier today. That bleeding has since stopped with our treatment. His brother died suddenly 3 months ago from an aortic aneurysm - he does not know if it was thoracic or abdominal.   Outpatient Medications Prior to Visit  Medication Sig Dispense Refill   benazepril (LOTENSIN) 10 MG tablet Take 1 tablet (10 mg total) by mouth at bedtime. 90 tablet 1   doxazosin (CARDURA) 4 MG tablet Take 1 tablet (4 mg total) by mouth at bedtime. 90 tablet 1   EPINEPHrine 0.3 mg/0.3 mL IJ SOAJ injection Inject 0.3 mg into the muscle once as needed (for a severe, allergic reaction).      hydrOXYzine (ATARAX/VISTARIL) 25 MG tablet Take 1 tablet (25 mg total) by mouth at bedtime as needed for anxiety. 30 tablet 2   Oxcarbazepine (TRILEPTAL) 300 MG tablet Take 1 tablet (300 mg total) by mouth 2 (two) times daily. 60 tablet 0   vortioxetine HBr (TRINTELLIX) 10 MG TABS tablet Decrease to 10 mg tablet daily for one week, then stop 7 tablet 0   busPIRone (BUSPAR) 30 MG tablet Take 1 tablet (30 mg total) by mouth 2 (two) times daily. 180 tablet 1   No facility-administered medications prior to visit.    ROS Review of Systems  Constitutional: Negative.  Negative for diaphoresis and fatigue.  HENT:  Positive for nosebleeds. Negative for sinus pressure and trouble swallowing.   Eyes: Negative.   Respiratory:  Negative for cough, chest tightness, shortness of breath and wheezing.   Cardiovascular:  Negative for chest pain,  palpitations and leg swelling.  Gastrointestinal: Negative.  Negative for abdominal pain.  Genitourinary: Negative.  Negative for hematuria.  Musculoskeletal: Negative.   Skin: Negative.  Negative for color change.  Neurological: Negative.  Negative for dizziness, weakness, light-headedness and headaches.  Hematological:  Negative for adenopathy. Does not bruise/bleed easily.  Psychiatric/Behavioral: Negative.     Objective:  BP 126/76 (BP Location: Left Arm, Patient Position: Sitting, Cuff Size: Large)   Pulse 82   Temp 98.1 F (36.7 C) (Oral)   Ht 6\' 1"  (1.854 m)   Wt 197 lb (89.4 kg)   SpO2 98%   BMI 25.99 kg/m   BP Readings from Last 3 Encounters:  11/09/21 (!) 168/108  11/08/21 126/76  10/26/21 120/80    Wt Readings from Last 3 Encounters:  11/08/21 197 lb (89.4 kg)  10/26/21 199 lb (90.3 kg)  10/14/21 202 lb (91.6 kg)    Physical Exam Vitals reviewed.  Constitutional:      Appearance: He is not ill-appearing.  HENT:     Nose: Congestion present. No mucosal edema or rhinorrhea.     Right Nostril: No foreign body, epistaxis, septal hematoma or occlusion.     Left Nostril: Epistaxis present. No foreign body, septal hematoma or occlusion.   Eyes:     General: No scleral icterus.    Conjunctiva/sclera: Conjunctivae normal.  Cardiovascular:     Rate and Rhythm: Normal rate and regular rhythm.  Heart sounds: No murmur heard. Pulmonary:     Effort: Pulmonary effort is normal.     Breath sounds: No stridor. No wheezing, rhonchi or rales.  Abdominal:     General: Abdomen is flat.     Palpations: There is no mass.     Tenderness: There is no abdominal tenderness. There is no guarding.     Hernia: No hernia is present.  Musculoskeletal:        General: Normal range of motion.     Cervical back: Neck supple.     Right lower leg: No edema.     Left lower leg: No edema.  Lymphadenopathy:     Cervical: No cervical adenopathy.  Skin:    General: Skin is warm and  dry.  Neurological:     General: No focal deficit present.     Mental Status: He is alert and oriented to person, place, and time. Mental status is at baseline.  Psychiatric:        Mood and Affect: Mood normal.        Behavior: Behavior normal.    Lab Results  Component Value Date   WBC 6.1 06/30/2021   HGB 13.8 06/30/2021   HCT 40.2 06/30/2021   PLT 204.0 06/30/2021   GLUCOSE 87 05/17/2021   CHOL 213 (H) 05/17/2021   TRIG 271.0 (H) 05/17/2021   HDL 53.60 05/17/2021   LDLDIRECT 127.0 05/17/2021   ALT 12 06/30/2021   AST 8 06/30/2021   NA 137 05/17/2021   K 4.2 05/17/2021   CL 101 05/17/2021   CREATININE 1.04 05/17/2021   BUN 11 05/17/2021   CO2 30 05/17/2021   TSH 1.37 05/17/2021   INR 1.0 06/30/2021    DG Shoulder Left  Result Date: 02/19/2021 CLINICAL DATA:  Left shoulder pain EXAM: LEFT SHOULDER - 2+ VIEW COMPARISON:  None. FINDINGS: There is no evidence of fracture or dislocation. There is no evidence of arthropathy or other focal bone abnormality. Soft tissues are unremarkable. IMPRESSION: Negative. Electronically Signed   By: Helyn Numbers MD   On: 02/19/2021 22:55    Assessment & Plan:   Shawn York was seen today for epistaxis.  Diagnoses and all orders for this visit:  Seasonal allergic rhinitis due to pollen -     levocetirizine (XYZAL) 5 MG tablet; Take 1 tablet (5 mg total) by mouth every evening.  Primary hypertension- His BP is well controlled.  Family history of dissecting aortic aneurysm- Will screen for AAA and TAA. -     CT ANGIO CHEST AORTA W/CM & OR WO/CM; Future -     CT ANGIO ABDOMEN W &/OR WO CONTRAST; Future  Contrast media allergy -     predniSONE (DELTASONE) 50 MG tablet; Take one tab 13 hours before, 7 hours before, and 1 hour before CT scan -     diphenhydrAMINE (BENADRYL ALLERGY) 25 MG tablet; Take 50 mg one hour prior to CT scan  Left-sided epistaxis- I have asked him to use afrin as needed.   I am having Shawn York start on  levocetirizine, predniSONE, and diphenhydrAMINE. I am also having him maintain his EPINEPHrine, Oxcarbazepine, benazepril, busPIRone, doxazosin, hydrOXYzine, and vortioxetine HBr.  Meds ordered this encounter  Medications   levocetirizine (XYZAL) 5 MG tablet    Sig: Take 1 tablet (5 mg total) by mouth every evening.    Dispense:  90 tablet    Refill:  1   predniSONE (DELTASONE) 50 MG tablet    Sig: Take  one tab 13 hours before, 7 hours before, and 1 hour before CT scan    Dispense:  3 tablet    Refill:  0   diphenhydrAMINE (BENADRYL ALLERGY) 25 MG tablet    Sig: Take 50 mg one hour prior to CT scan    Dispense:  2 tablet    Refill:  0     Follow-up: Return in about 3 months (around 02/08/2022).  Sanda Linger, MD

## 2021-11-08 NOTE — Telephone Encounter (Signed)
Pt has OV today at 3pm ?

## 2021-11-09 ENCOUNTER — Other Ambulatory Visit: Payer: Self-pay

## 2021-11-09 ENCOUNTER — Encounter (HOSPITAL_BASED_OUTPATIENT_CLINIC_OR_DEPARTMENT_OTHER): Payer: Self-pay

## 2021-11-09 ENCOUNTER — Emergency Department (HOSPITAL_BASED_OUTPATIENT_CLINIC_OR_DEPARTMENT_OTHER)
Admission: EM | Admit: 2021-11-09 | Discharge: 2021-11-09 | Disposition: A | Discharge status: Home / Self Care | Payer: BC Managed Care – PPO | Attending: Emergency Medicine | Admitting: Emergency Medicine

## 2021-11-09 DIAGNOSIS — I10 Essential (primary) hypertension: Secondary | ICD-10-CM | POA: Diagnosis not present

## 2021-11-09 DIAGNOSIS — R040 Epistaxis: Secondary | ICD-10-CM | POA: Insufficient documentation

## 2021-11-09 DIAGNOSIS — Z79899 Other long term (current) drug therapy: Secondary | ICD-10-CM | POA: Insufficient documentation

## 2021-11-09 MED ORDER — OXYMETAZOLINE HCL 0.05 % NA SOLN
1.0000 | Freq: Once | NASAL | Status: AC
  Administered 2021-11-09: 1 via NASAL
  Filled 2021-11-09: qty 30

## 2021-11-09 NOTE — ED Triage Notes (Signed)
Patient here POV from Home with Epistaxis. ? ?Endorses Nosebleed for approximately 1.5 hours that has not stopped or lessened. Bleeding from Left Nare.  ? ?No Anticoagulants. No Other Symptoms.  ? ?NAD Noted during Triage. A&Ox4. GCS 15. Ambulatory.  ?

## 2021-11-09 NOTE — ED Provider Notes (Signed)
?[facility] ?Provider Note ? ? ?CSN: [id number] ?Arrival date & time: 11/09/21  1744 ? ?  ? ?History ? ?Chief Complaint  ?Patient presents with  ? Epistaxis  ? ? ?Shawn York is a 46 y.o. male.  With past medical history of brain tumor, hypertension who presents to the emergency department with nosebleed. ? ?Patient states that around 430 this evening he began having nosebleed from the left nare.  He states that it stopped about 10 minutes prior to my exam.  He states that he also had nosebleed yesterday and went to primary care for this.  He was instructed to present to the emergency department should he have repeat nosebleed.  He denies use of blood thinners or clotting disorder or family history of clotting disorder.  He denies headache, lightheadedness or dizziness.  He does have hypertension and he is currently medicated for this. ? ? ?Epistaxis ? ?  ? ?Home Medications ?Prior to Admission medications   ?Medication Sig Start Date End Date Taking? Authorizing Provider  ?benazepril (LOTENSIN) 10 MG tablet Take 1 tablet (10 mg total) by mouth at bedtime. 02/18/21   Marrian Salvage, FNP  ?busPIRone (BUSPAR) 30 MG tablet Take 1 tablet (30 mg total) by mouth 2 (two) times daily. 04/29/21 07/28/21  Thayer Headings, PMHNP  ?diphenhydrAMINE (BENADRYL ALLERGY) 25 MG tablet Take 50 mg one hour prior to CT scan 11/08/21 11/09/21  Janith Lima, MD  ?doxazosin (CARDURA) 4 MG tablet Take 1 tablet (4 mg total) by mouth at bedtime. 04/29/21   Thayer Headings, [place]  ?EPINEPHrine 0.3 mg/0.3 mL IJ SOAJ injection Inject 0.3 mg into the muscle once as needed (for a severe, allergic reaction).     [provider]  ?hydrOXYzine (ATARAX/VISTARIL) 25 MG tablet Take 1 tablet (25 mg total) by mouth at bedtime as needed for anxiety. 05/27/21   Thayer Headings, PMHNP  ?levocetirizine (XYZAL) 5 MG tablet Take 1 tablet (5 mg total) by mouth every evening. 11/08/21   Janith Lima, MD  ?Oxcarbazepine  (TRILEPTAL) 300 MG tablet Take 1 tablet (300 mg total) by mouth 2 (two) times daily. 08/21/16   Derrill Center, NP  ?predniSONE (DELTASONE) 50 MG tablet Take one tab 13 hours before, 7 hours before, and 1 hour before CT scan 11/08/21 11/09/21  Janith Lima, MD  ?vortioxetine HBr (TRINTELLIX) 10 MG TABS tablet Decrease to 10 mg tablet daily for one week, then stop 05/27/21   Thayer Headings, [place]  ?   ? ?Allergies    ?Iodinated contrast media, Penicillins, Shellfish allergy, and Shellfish-derived products   ? ?Review of Systems   ?Review of Systems  ?HENT:  Positive for nosebleeds.   ?All other systems reviewed and are negative. ? ?Physical Exam ?Updated Vital Signs ?BP (!) 181/121 (BP Location: Right Arm)   Pulse 63   Temp 97.9 ?F (36.6 ?C) (Oral)   Resp 20   SpO2 100%  ?Physical Exam ?Vitals and nursing note reviewed.  ?Constitutional:   ?   General: He is not in acute distress. ?   Appearance: Normal appearance. He is not ill-appearing.  ?HENT:  ?   Head: Normocephalic and atraumatic.  ?   Nose: No nasal deformity or septal deviation.  ?   Right Nostril: Epistaxis present. No foreign body or septal hematoma.  ?   Left Nostril: Epistaxis present. No foreign body or septal hematoma.  ?   Right Turbinates: Swollen.  ?   Left Turbinates: Swollen.  ?  Comments: Dried blood in bilateral nares without current active bleeding ?   Mouth/Throat:  ?   Mouth: Mucous membranes are moist.  ?   Pharynx: Oropharynx is clear.  ?Eyes:  ?   Extraocular Movements: Extraocular movements intact.  ?Cardiovascular:  ?   Pulses: Normal pulses.  ?Pulmonary:  ?   Effort: Pulmonary effort is normal. No respiratory distress.  ?Abdominal:  ?   Palpations: Abdomen is soft.  ?Musculoskeletal:     ?   General: Normal range of motion.  ?   Cervical back: Neck supple.  ?Skin: ?   General: Skin is warm and dry.  ?   Capillary Refill: Capillary refill takes less than 2 seconds.  ?   Coloration: Skin is not pale.  ?Neurological:  ?   General:  No focal deficit present.  ?   Mental Status: He is alert and oriented to person, place, and time. Mental status is at baseline.  ?Psychiatric:     ?   Mood and Affect: Mood normal.     ?   Behavior: Behavior normal.     ?   Thought Content: Thought content normal.     ?   Judgment: Judgment normal.  ? ? ?ED Results / Procedures / Treatments   ?Labs ?(all labs ordered are listed, but only abnormal results are displayed) ?Labs Reviewed - No data to display ? ?EKG ?None ? ?Radiology ?No results found. ? ?Procedures ?Procedures  ? ?Medications Ordered in ED ?Medications  ?oxymetazoline (AFRIN) 0.05 % nasal spray 1 spray (has no administration in time range)  ? ? ?ED Course/ Medical Decision Making/ A&P ?  ?                        ?Medical Decision Making ?Risk ?OTC drugs. ? ?This patient presents to the ED for concern of epistaxis, this involves an extensive number of treatment options, and is a complaint that carries with it a high risk of complications and morbidity.  The differential diagnosis includes posterior anterior bleeding, AVM, viral URI ? ? ?Co morbidities that complicate the patient evaluation ?Hypertension ? ?Additional history obtained:  ?Additional history obtained from: None ?External records from outside source obtained and reviewed including: Previous primary care visits ? ?Medications  ?I ordered medication including oxymetazoline for epistaxis ?Reevaluation of the patient after medication shows that patient resolved ? ?ED Course: ?Patient presents with likely anterior epistaxis which is now resolved.  There is no risk factors for bleeding disorders and the patient is hemodynamically stable.  There is no evidence of anemia, will not pursue lab work at this time.  He had swelling of epistaxis upon arrival to the emergency department.  We used Afrin and reevaluated with continued resolution. ? ?We will give him referral to ENT as this is his second nosebleed in the past 2 days. ? ?Patient was educated  on risks of blood pressure.  We discussed his elevated blood pressure in the emergency department.  He states he takes medication for this however ask if he should increase it.  I discussed him sending a message to the primary care provider and taking his blood pressure at home when he is not in the emergency department setting.  He verbalized understanding.  Given return precautions for epistaxis that does not resolve at home after Afrin. ? ? ?After consideration of the diagnostic results and the patients response to treatment, I feel that the patent would benefit from discharge. ?The  patient has been appropriately medically screened and/or stabilized in the ED. I have low suspicion for any other emergent medical condition which would require further screening, evaluation or treatment in the ED or require inpatient management. The patient is overall well appearing and non-toxic in appearance. They are hemodynamically stable at time of discharge.   ?Final Clinical Impression(s) / ED Diagnoses ?Final diagnoses:  ?Epistaxis  ? ? ?Rx / DC Orders ?ED Discharge Orders   ? ? None  ? ?  ? ? ?  ?Mickie Hillier, PA-C ?11/09/21 1943 ? ?  ?Wyvonnia Dusky, MD ?11/10/21 [id number] ? ?

## 2021-11-09 NOTE — Discharge Instructions (Addendum)
You are seen in the emergency department today for nosebleed.  While you are here we got the nosebleed to stop.  In the future if you begin to have nosebleed at home place ice within a washcloth and hold steady, hard pressure at the bridge of the nose for 20 minutes.  If you continue to have bleeding please spray Afrin up your nose or soak a piece of toilet paper or gauze and Afrin and placed this in your nose for at least 10 minutes and recheck.  If you continue to have bleeding please present to the emergency department.  I provided you with a reference for a local ENT.  Please give them a call tomorrow to schedule an appointment. ?

## 2021-11-10 ENCOUNTER — Encounter: Payer: Self-pay | Admitting: Internal Medicine

## 2021-11-10 DIAGNOSIS — R040 Epistaxis: Secondary | ICD-10-CM | POA: Insufficient documentation

## 2021-11-13 ENCOUNTER — Other Ambulatory Visit: Payer: Self-pay

## 2021-11-13 ENCOUNTER — Emergency Department (HOSPITAL_COMMUNITY)
Admission: EM | Admit: 2021-11-13 | Discharge: 2021-11-13 | Disposition: A | Discharge status: Home / Self Care | Payer: BC Managed Care – PPO | Source: Home / Self Care | Attending: Emergency Medicine | Admitting: Emergency Medicine

## 2021-11-13 ENCOUNTER — Encounter (HOSPITAL_COMMUNITY): Payer: Self-pay | Admitting: *Deleted

## 2021-11-13 ENCOUNTER — Emergency Department (HOSPITAL_COMMUNITY): Payer: BC Managed Care – PPO

## 2021-11-13 DIAGNOSIS — Z87891 Personal history of nicotine dependence: Secondary | ICD-10-CM | POA: Diagnosis not present

## 2021-11-13 DIAGNOSIS — I10 Essential (primary) hypertension: Secondary | ICD-10-CM | POA: Diagnosis not present

## 2021-11-13 DIAGNOSIS — R040 Epistaxis: Secondary | ICD-10-CM | POA: Diagnosis not present

## 2021-11-13 DIAGNOSIS — Z79899 Other long term (current) drug therapy: Secondary | ICD-10-CM | POA: Insufficient documentation

## 2021-11-13 DIAGNOSIS — Z85828 Personal history of other malignant neoplasm of skin: Secondary | ICD-10-CM | POA: Diagnosis not present

## 2021-11-13 DIAGNOSIS — Z808 Family history of malignant neoplasm of other organs or systems: Secondary | ICD-10-CM | POA: Diagnosis not present

## 2021-11-13 DIAGNOSIS — Z880 Allergy status to penicillin: Secondary | ICD-10-CM | POA: Diagnosis not present

## 2021-11-13 DIAGNOSIS — Z85841 Personal history of malignant neoplasm of brain: Secondary | ICD-10-CM | POA: Diagnosis not present

## 2021-11-13 DIAGNOSIS — R0602 Shortness of breath: Secondary | ICD-10-CM | POA: Diagnosis not present

## 2021-11-13 DIAGNOSIS — D649 Anemia, unspecified: Secondary | ICD-10-CM | POA: Diagnosis not present

## 2021-11-13 DIAGNOSIS — F4310 Post-traumatic stress disorder, unspecified: Secondary | ICD-10-CM | POA: Diagnosis not present

## 2021-11-13 DIAGNOSIS — C712 Malignant neoplasm of temporal lobe: Secondary | ICD-10-CM | POA: Diagnosis not present

## 2021-11-13 DIAGNOSIS — Z91013 Allergy to seafood: Secondary | ICD-10-CM | POA: Diagnosis not present

## 2021-11-13 DIAGNOSIS — Z91041 Radiographic dye allergy status: Secondary | ICD-10-CM | POA: Diagnosis not present

## 2021-11-13 DIAGNOSIS — F418 Other specified anxiety disorders: Secondary | ICD-10-CM | POA: Diagnosis not present

## 2021-11-13 DIAGNOSIS — Z20822 Contact with and (suspected) exposure to covid-19: Secondary | ICD-10-CM | POA: Diagnosis not present

## 2021-11-13 DIAGNOSIS — F339 Major depressive disorder, recurrent, unspecified: Secondary | ICD-10-CM | POA: Diagnosis not present

## 2021-11-13 DIAGNOSIS — R0789 Other chest pain: Secondary | ICD-10-CM | POA: Diagnosis not present

## 2021-11-13 DIAGNOSIS — R079 Chest pain, unspecified: Secondary | ICD-10-CM | POA: Diagnosis not present

## 2021-11-13 DIAGNOSIS — Z8249 Family history of ischemic heart disease and other diseases of the circulatory system: Secondary | ICD-10-CM | POA: Diagnosis not present

## 2021-11-13 HISTORY — DX: Idiopathic aseptic necrosis of unspecified bone: M87.00

## 2021-11-13 LAB — BASIC METABOLIC PANEL
Anion gap: 5 (ref 5–15)
BUN: 20 mg/dL (ref 6–20)
CO2: 27 mmol/L (ref 22–32)
Calcium: 8.9 mg/dL (ref 8.9–10.3)
Chloride: 104 mmol/L (ref 98–111)
Creatinine, Ser: 0.95 mg/dL (ref 0.61–1.24)
GFR, Estimated: >60 mL/min (ref >60)
Glucose, Bld: 87 mg/dL (ref 70–99)
Potassium: 3.5 mmol/L (ref 3.5–5.1)
Sodium: 136 mmol/L (ref 135–145)

## 2021-11-13 LAB — TROPONIN I (HIGH SENSITIVITY): Troponin I (High Sensitivity): <2 ng/L (ref <18)

## 2021-11-13 LAB — CBC
HCT: 39.4 % (ref 39.0–52.0)
Hemoglobin: 13.7 g/dL (ref 13.0–17.0)
MCH: 33.8 pg (ref 26.0–34.0)
MCHC: 34.8 g/dL (ref 30.0–36.0)
MCV: 97.3 fL (ref 80.0–100.0)
Platelets: 176 10*3/uL (ref 150–400)
RBC: 4.05 MIL/uL — ABNORMAL LOW (ref 4.22–5.81)
RDW: 12.1 % (ref 11.5–15.5)
WBC: 4.9 10*3/uL (ref 4.0–10.5)
nRBC: 0 % (ref 0.0–0.2)

## 2021-11-13 NOTE — Discharge Instructions (Addendum)
Return for any problem. ? ?Keep nasal packing in place as discussed. ? ?Continue current medications. ? ?Follow-up closely with your PCP and with ENT as discussed. ? ?Labs, imaging, and EKG obtained today are without significant abnormality.  Specifically - you have no evidence of significant blood loss, cardiac condition, or other significant emergent condition. ?

## 2021-11-13 NOTE — ED Triage Notes (Addendum)
Pt with to UC this morning due to nosebleed. Packing in left side continues to bleed, pt states he also has HTN which he is concerned about. Pt has some anxiety in triage due to past history with brother having and aneurysm. Pt did take an extra dose of bp meds last night. ? ?Pt has not had alcohol since Monday after drinking an average of 12 beers a day, concerned he is starting into withdrawal. ?

## 2021-11-13 NOTE — ED Provider Notes (Signed)
?[facility] DEPT ?Provider Note ? ? ?CSN: [id number] ?Arrival date & time: 11/13/21  1111 ? ?  ? ?History ? ?Chief Complaint  ?Patient presents with  ? Epistaxis  ? Hypertension  ? ? ?Shawn York is a 46 y.o. male. ? ?46 year old male with prior medical history as detailed below presents for evaluation.  Patient reports intermittent left-sided nosebleed for the last week.  Patient reports that he went to urgent care this morning after continued intermittent bleeding from the nare.  Patient received packing in urgent care.  Patient reports that there was some minimal oozing around the packing after application by urgent care.  He decided to come to the ED for second opinion. ? ?He denies other complaint. ? ?Of note, patient does report that he has been sober for the last week.  He had significant alcohol consumption up until last weekend.  He denies symptoms consistent with withdrawal. ? ?The history is provided by the patient and medical records.  ?Epistaxis ?Location:  L nare ?Severity:  Mild ?Duration:  1 week ?Timing:  Intermittent ?Progression:  Resolved ?Chronicity:  New ?Relieved by:  Nothing ?Worsened by:  Nothing ?Hypertension ? ? ?  ? ?Home Medications ?Prior to Admission medications   ?Medication Sig Start Date End Date Taking? Authorizing Provider  ?benazepril (LOTENSIN) 10 MG tablet Take 1 tablet (10 mg total) by mouth at bedtime. 02/18/21   Marrian Salvage, FNP  ?busPIRone (BUSPAR) 30 MG tablet Take 1 tablet (30 mg total) by mouth 2 (two) times daily. 04/29/21 07/28/21  Thayer Headings, PMHNP  ?diphenhydrAMINE (BENADRYL ALLERGY) 25 MG tablet Take 50 mg one hour prior to CT scan 11/08/21 11/09/21  Janith Lima, MD  ?doxazosin (CARDURA) 4 MG tablet Take 1 tablet (4 mg total) by mouth at bedtime. 04/29/21   Thayer Headings, [place]  ?EPINEPHrine 0.3 mg/0.3 mL IJ SOAJ injection Inject 0.3 mg into the muscle once as needed (for a severe, allergic reaction).     [provider]  ?hydrOXYzine (ATARAX/VISTARIL) 25 MG tablet Take 1 tablet (25 mg total) by mouth at bedtime as needed for anxiety. 05/27/21   Thayer Headings, PMHNP  ?levocetirizine (XYZAL) 5 MG tablet Take 1 tablet (5 mg total) by mouth every evening. 11/08/21   Janith Lima, MD  ?Oxcarbazepine (TRILEPTAL) 300 MG tablet Take 1 tablet (300 mg total) by mouth 2 (two) times daily. 08/21/16   Derrill Center, NP  ?vortioxetine HBr (TRINTELLIX) 10 MG TABS tablet Decrease to 10 mg tablet daily for one week, then stop 05/27/21   Thayer Headings, [place]  ?   ? ?Allergies    ?Iodinated contrast media, Penicillins, Shellfish allergy, and Shellfish-derived products   ? ?Review of Systems   ?Review of Systems  ?HENT:  Positive for nosebleeds.   ?All other systems reviewed and are negative. ? ?Physical Exam ?Updated Vital Signs ?BP (!) 126/98 (BP Location: Left Arm)   Pulse 84   Temp 97.7 ?F (36.5 ?C) (Oral)   Resp 18   Ht '6\' 1"'$  (1.854 m)   Wt 89.4 kg   SpO2 100%   BMI 25.99 kg/m?  ?Physical Exam ?Vitals and nursing note reviewed.  ?Constitutional:   ?   General: He is not in acute distress. ?   Appearance: Normal appearance. He is well-developed.  ?HENT:  ?   Head: Normocephalic and atraumatic.  ?   Nose:  ?   Comments: Packing in place in left nare.  No evidence of active  hemorrhage from prior reported epistaxis. ?Eyes:  ?   Conjunctiva/sclera: Conjunctivae normal.  ?   Pupils: Pupils are equal, round, and reactive to light.  ?Cardiovascular:  ?   Rate and Rhythm: Normal rate and regular rhythm.  ?   Heart sounds: Normal heart sounds.  ?Pulmonary:  ?   Effort: Pulmonary effort is normal. No respiratory distress.  ?   Breath sounds: Normal breath sounds.  ?Abdominal:  ?   General: There is no distension.  ?   Palpations: Abdomen is soft.  ?   Tenderness: There is no abdominal tenderness.  ?Musculoskeletal:     ?   General: No deformity. Normal range of motion.  ?   Cervical back: Normal range of motion and neck supple.   ?Skin: ?   General: Skin is warm and dry.  ?Neurological:  ?   General: No focal deficit present.  ?   Mental Status: He is alert and oriented to person, place, and time.  ? ? ?ED Results / Procedures / Treatments   ?Labs ?(all labs ordered are listed, but only abnormal results are displayed) ?Labs Reviewed  ?CBC - Abnormal; Notable for the following components:  ?    Result Value  ? RBC 4.05 (*)   ? All other components within normal limits  ?BASIC METABOLIC PANEL  ?TROPONIN I (HIGH SENSITIVITY)  ? ? ?EKG ?EKG Interpretation ? ?Date/Time:  Saturday November 13 2021 11:54:41 EDT ?Ventricular Rate:  59 ?PR Interval:  161 ?QRS Duration: 101 ?QT Interval:  407 ?QTC Calculation: 404 ?R Axis:   71 ?Text Interpretation: Sinus rhythm Minimal ST depression, diffuse leads Since last tracing ST depression in diffuse leads is new Otherwise no significant change Confirmed by Daleen Bo [phone]) on 11/13/2021 12:44:40 PM ? ?Radiology ?DG Chest 2 View ? ?Result Date: 11/13/2021 ?CLINICAL DATA:  Chest pressure and shortness of breath.  Nosebleed. EXAM: CHEST - 2 VIEW COMPARISON:  Chest x-ray dated April 30, 2020. FINDINGS: The heart size and mediastinal contours are within normal limits. Both lungs are clear. The visualized skeletal structures are unremarkable. IMPRESSION: No active cardiopulmonary disease. Electronically Signed   By: Titus Dubin M.D.   On: 11/13/2021 11:53   ? ?Procedures ?Procedures  ? ? ?Medications Ordered in ED ?Medications - No data to display ? ?ED Course/ Medical Decision Making/ A&P ?  ?                        ?Medical Decision Making ?Amount and/or Complexity of Data Reviewed ?Labs: ordered. ?Radiology: ordered. ? ? ? ?Medical Screen Complete ? ?This patient presented to the ED with complaint of epistaxis. ? ?This complaint involves an extensive number of treatment options. The initial differential diagnosis includes, but is not limited to, epistaxis ? ?This presentation is: Acute, Self-Limited,  Previously Undiagnosed, Uncertain Prognosis, Complicated, Systemic Symptoms, and Threat to Life/Bodily Function ? ?Patient is presenting for reevaluation after recent visit to urgent care. ?Patient reports multiple intermittent episodes of left-sided epistaxis over the last week. ? ?Patient was seen in urgent care this AM.  Packing was placed.  Patient tolerated this poorly -- however his bleeding has stopped.  He did note some mild oozing around the packing came to the ED for reevaluation. ? ?No active hemorrhage seen on exam today.  Patient's labs obtained are without significant abnormality.  Patient is reassured by today's ED evaluation. ? ?He is encouraged to closely follow-up in the outpatient setting with both his  PCP and also with ENT. ? ?Strict return precautions given and understood.  Importance of close outpatient follow-up is repeatedly stressed. ? ?Additional history obtained: ? ?External records from outside sources obtained and reviewed including prior ED visits and prior Inpatient records.  ? ? ?Lab Tests: ? ?I ordered and personally interpreted labs.  The pertinent results include: CBC, BMP, troponin ? ? ?Imaging Studies ordered: ? ?I ordered imaging studies including chest x-ray ?I independently visualized and interpreted obtained imaging which showed no acute disease ?I agree with the radiologist interpretation. ? ? ?Cardiac Monitoring: ? ?The patient was maintained on a cardiac monitor.  I personally viewed and interpreted the cardiac monitor which showed an underlying rhythm of: NSR ? ?Problem List / ED Course: ? ?Epistaxis, resolved ? ? ?Reevaluation: ? ?After the interventions noted above, I reevaluated the patient and found that they have: improved ? ? ?Disposition: ? ?After consideration of the diagnostic results and the patients response to treatment, I feel that the patent would benefit from close outpatient follow-up.  ? ? ? ? ? ? ? ? ?Final Clinical Impression(s) / ED Diagnoses ?Final  diagnoses:  ?Epistaxis  ? ? ?Rx / DC Orders ?ED Discharge Orders   ? ? None  ? ?  ? ? ?  ?Valarie Merino, MD ?11/13/21 1415 ? ?

## 2021-11-15 ENCOUNTER — Emergency Department (HOSPITAL_COMMUNITY): Payer: BC Managed Care – PPO

## 2021-11-15 ENCOUNTER — Inpatient Hospital Stay (HOSPITAL_COMMUNITY)
Admission: EM | Admit: 2021-11-15 | Discharge: 2021-11-17 | DRG: 982 | Disposition: A | Discharge status: Home / Self Care | Payer: BC Managed Care – PPO | Attending: Internal Medicine | Admitting: Internal Medicine

## 2021-11-15 ENCOUNTER — Encounter (HOSPITAL_COMMUNITY): Payer: Self-pay | Admitting: Emergency Medicine

## 2021-11-15 DIAGNOSIS — Z79899 Other long term (current) drug therapy: Secondary | ICD-10-CM

## 2021-11-15 DIAGNOSIS — F339 Major depressive disorder, recurrent, unspecified: Secondary | ICD-10-CM | POA: Diagnosis present

## 2021-11-15 DIAGNOSIS — I10 Essential (primary) hypertension: Secondary | ICD-10-CM | POA: Diagnosis present

## 2021-11-15 DIAGNOSIS — Z91041 Radiographic dye allergy status: Secondary | ICD-10-CM | POA: Diagnosis present

## 2021-11-15 DIAGNOSIS — C712 Malignant neoplasm of temporal lobe: Secondary | ICD-10-CM | POA: Diagnosis present

## 2021-11-15 DIAGNOSIS — Z808 Family history of malignant neoplasm of other organs or systems: Secondary | ICD-10-CM

## 2021-11-15 DIAGNOSIS — Z87891 Personal history of nicotine dependence: Secondary | ICD-10-CM

## 2021-11-15 DIAGNOSIS — F332 Major depressive disorder, recurrent severe without psychotic features: Secondary | ICD-10-CM

## 2021-11-15 DIAGNOSIS — Z880 Allergy status to penicillin: Secondary | ICD-10-CM

## 2021-11-15 DIAGNOSIS — Z85841 Personal history of malignant neoplasm of brain: Secondary | ICD-10-CM

## 2021-11-15 DIAGNOSIS — Z8249 Family history of ischemic heart disease and other diseases of the circulatory system: Secondary | ICD-10-CM

## 2021-11-15 DIAGNOSIS — F4310 Post-traumatic stress disorder, unspecified: Secondary | ICD-10-CM | POA: Diagnosis present

## 2021-11-15 DIAGNOSIS — Z85828 Personal history of other malignant neoplasm of skin: Secondary | ICD-10-CM

## 2021-11-15 DIAGNOSIS — Z91013 Allergy to seafood: Secondary | ICD-10-CM

## 2021-11-15 DIAGNOSIS — Z20822 Contact with and (suspected) exposure to covid-19: Secondary | ICD-10-CM | POA: Diagnosis present

## 2021-11-15 DIAGNOSIS — M87051 Idiopathic aseptic necrosis of right femur: Secondary | ICD-10-CM

## 2021-11-15 DIAGNOSIS — R040 Epistaxis: Secondary | ICD-10-CM | POA: Diagnosis not present

## 2021-11-15 LAB — CBC WITH DIFFERENTIAL/PLATELET
Abs Immature Granulocytes: 0.01 10*3/uL (ref 0.00–0.07)
Basophils Absolute: 0 10*3/uL (ref 0.0–0.1)
Basophils Relative: 0 %
Eosinophils Absolute: 0.1 10*3/uL (ref 0.0–0.5)
Eosinophils Relative: 1 %
HCT: 39 % (ref 39.0–52.0)
Hemoglobin: 13.6 g/dL (ref 13.0–17.0)
Immature Granulocytes: 0 %
Lymphocytes Relative: 43 %
Lymphs Abs: 2.3 10*3/uL (ref 0.7–4.0)
MCH: 33.2 pg (ref 26.0–34.0)
MCHC: 34.9 g/dL (ref 30.0–36.0)
MCV: 95.1 fL (ref 80.0–100.0)
Monocytes Absolute: 0.5 10*3/uL (ref 0.1–1.0)
Monocytes Relative: 10 %
Neutro Abs: 2.4 10*3/uL (ref 1.7–7.7)
Neutrophils Relative %: 46 %
Platelets: 175 10*3/uL (ref 150–400)
RBC: 4.1 MIL/uL — ABNORMAL LOW (ref 4.22–5.81)
RDW: 12 % (ref 11.5–15.5)
WBC: 5.2 10*3/uL (ref 4.0–10.5)
nRBC: 0 % (ref 0.0–0.2)

## 2021-11-15 LAB — BASIC METABOLIC PANEL
Anion gap: 9 (ref 5–15)
BUN: 14 mg/dL (ref 6–20)
CO2: 24 mmol/L (ref 22–32)
Calcium: 9.2 mg/dL (ref 8.9–10.3)
Chloride: 102 mmol/L (ref 98–111)
Creatinine, Ser: 0.97 mg/dL (ref 0.61–1.24)
GFR, Estimated: >60 mL/min (ref >60)
Glucose, Bld: 99 mg/dL (ref 70–99)
Potassium: 3.7 mmol/L (ref 3.5–5.1)
Sodium: 135 mmol/L (ref 135–145)

## 2021-11-15 LAB — TROPONIN I (HIGH SENSITIVITY): Troponin I (High Sensitivity): 4 ng/L (ref <18)

## 2021-11-15 LAB — ABO/RH: ABO/RH(D): O POS

## 2021-11-15 LAB — PROTIME-INR
INR: 1 (ref 0.8–1.2)
Prothrombin Time: 13.4 seconds (ref 11.4–15.2)

## 2021-11-15 LAB — RESP PANEL BY RT-PCR (FLU A&B, COVID) ARPGX2
Influenza A by PCR: NEGATIVE
Influenza B by PCR: NEGATIVE
SARS Coronavirus 2 by RT PCR: NEGATIVE

## 2021-11-15 MED ORDER — LABETALOL HCL 5 MG/ML IV SOLN
10.0000 mg | Freq: Once | INTRAVENOUS | Status: AC
  Administered 2021-11-15: 10 mg via INTRAVENOUS

## 2021-11-15 MED ORDER — LABETALOL HCL 5 MG/ML IV SOLN
10.0000 mg | Freq: Once | INTRAVENOUS | Status: AC
  Administered 2021-11-15: 10 mg via INTRAVENOUS
  Filled 2021-11-15: qty 4

## 2021-11-15 MED ORDER — SODIUM CHLORIDE 0.9 % IV SOLN
1.0000 g | INTRAVENOUS | Status: DC
  Administered 2021-11-15 – 2021-11-17 (×2): 1 g via INTRAVENOUS
  Filled 2021-11-15 (×2): qty 10

## 2021-11-15 MED ORDER — MORPHINE SULFATE (PF) 4 MG/ML IV SOLN
4.0000 mg | Freq: Once | INTRAVENOUS | Status: AC
  Administered 2021-11-15: 4 mg via INTRAVENOUS
  Filled 2021-11-15: qty 1

## 2021-11-15 MED ORDER — LABETALOL HCL 5 MG/ML IV SOLN
5.0000 mg | Freq: Once | INTRAVENOUS | Status: AC
  Administered 2021-11-15: 5 mg via INTRAVENOUS
  Filled 2021-11-15: qty 4

## 2021-11-15 MED ORDER — DILTIAZEM HCL 25 MG/5ML IV SOLN
5.0000 mg | Freq: Once | INTRAVENOUS | Status: AC
  Administered 2021-11-15: 5 mg via INTRAVENOUS
  Filled 2021-11-15: qty 5

## 2021-11-15 MED ORDER — HYDROMORPHONE HCL 1 MG/ML IJ SOLN
INTRAMUSCULAR | Status: AC
  Administered 2021-11-15: 1 mg
  Filled 2021-11-15: qty 1

## 2021-11-15 MED ORDER — LORAZEPAM 2 MG/ML IJ SOLN
0.5000 mg | Freq: Once | INTRAMUSCULAR | Status: AC
Start: 2021-11-15 — End: 2021-11-15
  Administered 2021-11-15: 0.5 mg via INTRAVENOUS
  Filled 2021-11-15: qty 1

## 2021-11-15 MED ORDER — HYDRALAZINE HCL 20 MG/ML IJ SOLN
5.0000 mg | Freq: Once | INTRAMUSCULAR | Status: AC
  Administered 2021-11-15: 5 mg via INTRAVENOUS
  Filled 2021-11-15: qty 1

## 2021-11-15 MED ORDER — HYDROCORTISONE SOD SUC (PF) 250 MG IJ SOLR
200.0000 mg | Freq: Once | INTRAMUSCULAR | Status: AC
  Administered 2021-11-16: 200 mg via INTRAVENOUS
  Filled 2021-11-15: qty 200

## 2021-11-15 MED ORDER — DIPHENHYDRAMINE HCL 25 MG PO CAPS: 50.0000 mg | ORAL_CAPSULE | Freq: Once | ORAL | Status: AC

## 2021-11-15 MED ORDER — HYDROMORPHONE HCL 1 MG/ML IJ SOLN
1.0000 mg | Freq: Once | INTRAMUSCULAR | Status: AC
  Administered 2021-11-15: 1 mg via INTRAVENOUS
  Filled 2021-11-15: qty 1

## 2021-11-15 MED ORDER — DIPHENHYDRAMINE HCL 50 MG/ML IJ SOLN
50.0000 mg | Freq: Once | INTRAMUSCULAR | Status: AC
  Administered 2021-11-16: 50 mg via INTRAVENOUS
  Filled 2021-11-15: qty 1

## 2021-11-15 MED ORDER — HYDROMORPHONE HCL 1 MG/ML IJ SOLN: 1.0000 mg | Freq: Once | INTRAMUSCULAR | Status: DC

## 2021-11-15 NOTE — Assessment & Plan Note (Signed)
Will start emergent contrast allergy protocol. ?

## 2021-11-15 NOTE — Subjective & Objective (Addendum)
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  Patient's been seen by ENT multiple times.  He has had multiple packings both in the ER and ENT office.  Sent to the ER today by his ENT due to worsening bleeding from an already packed left nare. ? ?ENT is at the bedside and she is reattempting a anterior/posterior pack. ? ?Discussion ready held with neuro interventional however there is only one neuro interventionalists on-call today.  This would leave any potential stroke/bleeds uncovered.  Decision made to try to hold the patient off until in the morning. ? ?Patient and his wife made a point to tell me that his parents are Jehovah's Witnesses but the patient is not a Sales promotion account executive Witness and that he would rarely accept a blood transfusion if needed. ?

## 2021-11-15 NOTE — ED Triage Notes (Signed)
Patient to ED from ENT for epistaxis, has packing in left nare from ENT. Patient reports having frequent epistaxis since Monday last week. Patient continuing to bleeding through packing from ENT, patient states he was told at [facility] office that they did not have correct size balloon and that it may bleed again. ?

## 2021-11-15 NOTE — H&P (Signed)
?History and Physical  ? ? ?Shawn York POE:[id number] DOB: 05-21-1976 DOA: 11/15/2021 ? ?DOS: the patient was seen and examined on 11/15/2021 ? ?PCP: Janith Lima, MD  ? ?Patient coming from: Home ? ?I have personally briefly reviewed patient's old medical records in [place] ? 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  Patient's been seen by ENT multiple times.  He has had multiple packings both in the ER and ENT office.  Sent to the ER today by his ENT due to worsening bleeding from an already packed left nare. ? ?ENT is at the bedside and she is reattempting a anterior/posterior pack. ? ?Discussion ready held with neuro interventional however there is only one neuro interventionalists on-call today.  This would leave any potential stroke/bleeds uncovered.  Decision made to try to hold the patient off until in the morning.  ? ?ED Course: ENT at bedside attempting repacking of the patient's left nare.  Multiple doses of IV anti-hypertensives to try to control the patient's blood pressure.  Multiple doses of IV pain medicine given. ? ?Review of Systems:  ?Review of Systems  ?Constitutional: Negative.   ?HENT:  Positive for nosebleeds.   ?Eyes: Negative.   ?Respiratory: Negative.    ?Cardiovascular: Negative.   ?Gastrointestinal: Negative.   ?Genitourinary: Negative.   ?Musculoskeletal: Negative.   ?Skin: Negative.   ?Neurological: Negative.   ?Endo/Heme/Allergies: Negative.   ?Psychiatric/Behavioral: Negative.    ?All other systems reviewed and are negative. ? ?Past Medical History:  ?Diagnosis Date  ? Avascular necrosis ([place])   ? Brain cancer ([place]) 02/25/2011  ? grade III anaplastic ologodendrglioma  ? Hypertension   ? PTSD (post-traumatic stress disorder)   ? Seizures ([place])   ? ? ?Past Surgical History:   ?Procedure Laterality Date  ? BASAL CELL CARCINOMA EXCISION  1995  ? CRANIOTOMY FOR TUMOR Right 02/25/2011  ? WISDOM TOOTH EXTRACTION    ? ? ? reports that he quit smoking about 10 years ago. His smoking use included cigarettes. He has never used smokeless tobacco. He reports that he does not currently use alcohol after a past usage of about 14.0 standard drinks per week. He reports that he does not use drugs. ? ?Allergies  ?Allergen Reactions  ? Iodinated Contrast Media Rash and Anaphylaxis  ? Penicillins Shortness Of Breath and Rash  ?  Did it involve swelling of the face/tongue/throat, SOB, or low BP? Yes ?Did it involve sudden or severe rash/hives, skin peeling, or any reaction on the inside of your mouth or nose? No ?Did you need to seek medical attention at a hospital or doctor's office? No ?When did it last happen? Unk ?If all above answers are ?NO?, may proceed with cephalosporin use. ? ?  ? Shellfish Allergy Anaphylaxis and Rash  ? Shellfish-Derived Products Anaphylaxis and Rash  ? ? ?Family History  ?Problem Relation Age of Onset  ? Cancer Other   ?     brain cancer  ? Cancer Father   ?     skin cancer  ? Cancer Maternal Grandmother 66  ?     brain cancer  ? Alcohol abuse Brother   ? Alzheimer's disease Paternal Grandmother   ? Dementia Paternal Grandmother   ? Heart disease Paternal Grandfather   ? ? ?Prior to Admission medications   ?Medication Sig Start Date  End Date Taking? Authorizing Provider  ?acetaminophen (TYLENOL) 500 MG tablet Take 1,000 mg by mouth every 6 (six) hours as needed for moderate pain or headache.   Yes [provider]  ?benazepril (LOTENSIN) 10 MG tablet Take 1 tablet (10 mg total) by mouth at bedtime. ?Patient taking differently: Take 10 mg by mouth 2 (two) times daily. 02/18/21  Yes Marrian Salvage, FNP  ?busPIRone (BUSPAR) 30 MG tablet Take 1 tablet (30 mg total) by mouth 2 (two) times daily. 04/29/21 11/15/21 Yes Thayer Headings, PMHNP  ?doxazosin (CARDURA) 4 MG  tablet Take 1 tablet (4 mg total) by mouth at bedtime. 04/29/21  Yes Thayer Headings, [place]  ?EPINEPHrine 0.3 mg/0.3 mL IJ SOAJ injection Inject 0.3 mg into the muscle once as needed (for a severe, allergic reaction).    Yes [provider]  ?Oxcarbazepine (TRILEPTAL) 300 MG tablet Take 1 tablet (300 mg total) by mouth 2 (two) times daily. 08/21/16  Yes Derrill Center, NP  ?hydrOXYzine (ATARAX/VISTARIL) 25 MG tablet Take 1 tablet (25 mg total) by mouth at bedtime as needed for anxiety. ?Patient not taking: Reported on 11/15/2021 05/27/21   Thayer Headings, PMHNP  ?levocetirizine (XYZAL) 5 MG tablet Take 1 tablet (5 mg total) by mouth every evening. ?Patient not taking: Reported on 11/15/2021 11/08/21   Janith Lima, MD  ?vortioxetine HBr (TRINTELLIX) 10 MG TABS tablet Decrease to 10 mg tablet daily for one week, then stop ?Patient not taking: Reported on 11/15/2021 05/27/21   Thayer Headings, [place]  ? ? ?Physical Exam: ?Vitals:  ? 11/15/21 2030 11/15/21 2134 11/15/21 2145 11/15/21 2245  ?BP: (!) 154/119 (!) 190/112 (!) 185/126 (!) 164/114  ?Pulse: 75 69 67 68  ?Resp: '15 16 15 16  '$ ?Temp:      ?TempSrc:      ?SpO2: 100% 100% 100% 98%  ? ? ?Physical Exam ?Vitals and nursing note reviewed.  ?Constitutional:   ?   General: He is in acute distress.  ?   Comments: Appears in pain  ?HENT:  ?   Head: Normocephalic and atraumatic.  ?   Nose:  ?   Comments: Constant oozing of blood from left nare ?Cardiovascular:  ?   Rate and Rhythm: Normal rate and regular rhythm.  ?Pulmonary:  ?   Effort: Pulmonary effort is normal. No respiratory distress.  ?Abdominal:  ?   General: There is no distension.  ?Musculoskeletal:  ?   Right lower leg: No edema.  ?   Left lower leg: No edema.  ?Skin: ?   General: Skin is warm and dry.  ?   Capillary Refill: Capillary refill takes less than 2 seconds.  ?Neurological:  ?   General: No focal deficit present.  ?   Mental Status: He is alert and oriented to person, place, and time.  ?  ? ?Labs  on Admission: I have personally reviewed following labs and imaging studies ? ?CBC: ?Recent Labs  ?Lab 11/13/21 ?1158 11/15/21 ?1933  ?WBC 4.9 5.2  ?NEUTROABS  --  2.4  ?HGB 13.7 13.6  ?HCT 39.4 39.0  ?MCV 97.3 95.1  ?PLT 176 175  ? ?Basic Metabolic Panel: ?Recent Labs  ?Lab 11/13/21 ?1158 11/15/21 ?1933  ?NA 136 135  ?K 3.5 3.7  ?CL 104 102  ?CO2 27 24  ?GLUCOSE 87 99  ?BUN 20 14  ?CREATININE 0.95 0.97  ?CALCIUM 8.9 9.2  ? ?GFR: ?Estimated Creatinine Clearance: 108.7 mL/min (by C-G formula based on SCr of 0.97 mg/dL). ?Liver  Function Tests: ?No results for input(s): AST, ALT, ALKPHOS, BILITOT, PROT, ALBUMIN in the last 168 hours. ?No results for input(s): LIPASE, AMYLASE in the last 168 hours. ?No results for input(s): AMMONIA in the last 168 hours. ?Coagulation Profile: ?Recent Labs  ?Lab 11/15/21 ?1933  ?INR 1.0  ? ?Cardiac Enzymes: ?Recent Labs  ?Lab 11/13/21 ?1158 11/15/21 ?1900  ?TROPONINIHS <2 4  ? ?BNP (last 3 results) ?No results for input(s): PROBNP in the last 8760 hours. ?HbA1C: ?No results for input(s): HGBA1C in the last 72 hours. ?CBG: ?No results for input(s): GLUCAP in the last 168 hours. ?Lipid Profile: ?No results for input(s): CHOL, HDL, LDLCALC, TRIG, CHOLHDL, LDLDIRECT in the last 72 hours. ?Thyroid Function Tests: ?No results for input(s): TSH, T4TOTAL, FREET4, T3FREE, THYROIDAB in the last 72 hours. ?Anemia Panel: ?No results for input(s): VITAMINB12, FOLATE, FERRITIN, TIBC, IRON, RETICCTPCT in the last 72 hours. ?Urine analysis: ?   ?Component Value Date/Time  ? [place] 05/17/2021 1434  ? APPEARANCEUR CLEAR 05/17/2021 1434  ? LABSPEC 1.015 05/17/2021 1434  ? PHURINE 7.0 05/17/2021 1434  ? GLUCOSEU NEGATIVE 05/17/2021 1434  ? HGBUR NEGATIVE 05/17/2021 1434  ? [place] NEGATIVE 05/17/2021 1434  ? [place] NEGATIVE 05/17/2021 1434  ? PROTEINUR 30 06/21/2013 1505  ? UROBILINOGEN 1.0 05/17/2021 1434  ? NITRITE NEGATIVE 05/17/2021 1434  ? LEUKOCYTESUR NEGATIVE 05/17/2021 1434   ? ? ?Radiological Exams on Admission: I have personally reviewed images ?DG Chest Port 1 View ? ?Result Date: 11/15/2021 ?CLINICAL DATA:  Chest pain. EXAM: PORTABLE CHEST 1 VIEW COMPARISON:  November 13, 2021. FINDING

## 2021-11-15 NOTE — Assessment & Plan Note (Signed)
Admit to progressive bed. ENT at bedside repeating ant/posterior nasal packing. He will likely need embolization in the AM. Continue prn IV dilaudid for pain. Empiric IV rocephin due to posterior pack. ?

## 2021-11-15 NOTE — Assessment & Plan Note (Signed)
Keep NPO. Will need to use IV meds for HTN. IV labetalol in ER was ineffective in controlling BP. ?

## 2021-11-15 NOTE — Assessment & Plan Note (Signed)
Will hold mental health meds while NPO. ?

## 2021-11-15 NOTE — Consult Note (Signed)
ENT CONSULT: ? ?Reason for Consult: Epistaxis ? ?Referring Physician:  Dr. Francia Greaves ? ?HPI: ?Shawn York is an 46 y.o. male who presents to the ED with persistent left-sided epistaxis.  Patient first began having issues with left-sided nosebleeds 4 days ago, which he states occurred spontaneously when he was bent over.  He denies history of trauma or injury.  Patient was seen in the ENT clinic earlier today and at that time had comprehensive examination with did not reveal any evidence of active bleeding.  A 10 cm Merisel was placed due to recurrent nature of epistaxis.  Patient notes that later this evening, he began having breakthrough bleeding was which was not improved with application of Afrin and manual pressure.  While in the ED, the 10 cm Merisel was removed and 2 different Rhino Rocket's were placed without improvement in bleeding.  At time of examination, patient had persistent left-sided epistaxis and hypertension. ? ? ?Past Medical History:  ?Diagnosis Date  ? Avascular necrosis ([place])   ? 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 grade III anaplastic ologodendrglioma  ? Hypertension   ? PTSD (post-traumatic stress disorder)   ? Seizures ([place])   ? ? ?Past Surgical History:  ?Procedure Laterality Date  ? BASAL CELL CARCINOMA EXCISION  1995  ? CRANIOTOMY FOR TUMOR Right 02/25/2011  ? WISDOM TOOTH EXTRACTION    ? ? ?Family History  ?Problem Relation Age of Onset  ? Cancer Other   ?     brain cancer  ? Cancer Father   ?     skin cancer  ? Cancer Maternal Grandmother 68  ?     brain cancer  ? Alcohol abuse Brother   ? Alzheimer's disease Paternal Grandmother   ? Dementia Paternal Grandmother   ? Heart disease Paternal Grandfather   ? ? ?Social History:  reports that he quit smoking about 10 years ago. His smoking use included cigarettes. He has never used smokeless tobacco. He reports that he does not currently use alcohol after a past usage of about 14.0 standard drinks per week. He reports that he does not use  drugs. ? ?Allergies:  ?Allergies  ?Allergen Reactions  ? Iodinated Contrast Media Rash and Anaphylaxis  ? Penicillins Shortness Of Breath and Rash  ?  Did it involve swelling of the face/tongue/throat, SOB, or low BP? Yes ?Did it involve sudden or severe rash/hives, skin peeling, or any reaction on the inside of your mouth or nose? No ?Did you need to seek medical attention at a hospital or doctor's office? No ?When did it last happen? Unk ?If all above answers are ?NO?, may proceed with cephalosporin use. ? ?  ? Shellfish Allergy Anaphylaxis and Rash  ? Shellfish-Derived Products Anaphylaxis and Rash  ? ? ?Medications: I have reviewed the patient's current medications. ? ?Results for orders placed or performed during the hospital encounter of 11/15/21 (from the past 48 hour(s))  ?Troponin I (High Sensitivity)     Status: None  ? Collection Time: 11/15/21  7:00 PM  ?Result Value Ref Range  ? Troponin I (High Sensitivity) 4 <18 ng/L  ?  Comment: (NOTE) ?Elevated high sensitivity troponin I (hsTnI) values and significant  ?changes across serial measurements may suggest ACS but many other  ?chronic and acute conditions are known to elevate hsTnI results.  ?Refer to the "Links" section for chest pain algorithms and additional  ?guidance. ?Performed at [facility], [place] [address] [place], [place] ?[address] ?  ?Basic metabolic panel  Status: None  ? Collection Time: 11/15/21  7:33 PM  ?Result Value Ref Range  ? Sodium 135 135 - 145 mmol/L  ? Potassium 3.7 3.5 - 5.1 mmol/L  ? Chloride 102 98 - 111 mmol/L  ? CO2 24 22 - 32 mmol/L  ? Glucose, Bld 99 70 - 99 mg/dL  ?  Comment: Glucose reference range applies only to samples taken after fasting for at least 8 hours.  ? BUN 14 6 - 20 mg/dL  ? Creatinine, Ser 0.97 0.61 - 1.24 mg/dL  ? Calcium 9.2 8.9 - 10.3 mg/dL  ? GFR, Estimated >60 >60 mL/min  ?  Comment: (NOTE) ?Calculated using the CKD-EPI Creatinine Equation (2021) ?  ? Anion gap 9 5 - 15  ?  Comment: Performed  at [facility], [place] [address] [place], [place] [address]  ?CBC with Differential     Status: Abnormal  ? Collection Time: 11/15/21  7:33 PM  ?Result Value Ref Range  ? WBC 5.2 4.0 - 10.5 K/uL  ? RBC 4.10 (L) 4.22 - 5.81 MIL/uL  ? Hemoglobin 13.6 13.0 - 17.0 g/dL  ? HCT 39.0 39.0 - 52.0 %  ? MCV 95.1 80.0 - 100.0 fL  ? MCH 33.2 26.0 - 34.0 pg  ? MCHC 34.9 30.0 - 36.0 g/dL  ? RDW 12.0 11.5 - 15.5 %  ? Platelets 175 150 - 400 K/uL  ? nRBC 0.0 0.0 - 0.2 %  ? Neutrophils Relative % 46 %  ? Neutro Abs 2.4 1.7 - 7.7 K/uL  ? Lymphocytes Relative 43 %  ? Lymphs Abs 2.3 0.7 - 4.0 K/uL  ? Monocytes Relative 10 %  ? Monocytes Absolute 0.5 0.1 - 1.0 K/uL  ? Eosinophils Relative 1 %  ? Eosinophils Absolute 0.1 0.0 - 0.5 K/uL  ? Basophils Relative 0 %  ? Basophils Absolute 0.0 0.0 - 0.1 K/uL  ? Immature Granulocytes 0 %  ? Abs Immature Granulocytes 0.01 0.00 - 0.07 K/uL  ?  Comment: Performed at [facility], [place] [address] [place], [place] [address]  ?Protime-INR     Status: None  ? Collection Time: 11/15/21  7:33 PM  ?Result Value Ref Range  ? Prothrombin Time 13.4 11.4 - 15.2 seconds  ? INR 1.0 0.8 - 1.2  ?  Comment: (NOTE) ?INR goal varies based on device and disease states. ?Performed at [facility], [place] [address] [place], [place] ?[address] ?  ?ABO/Rh     Status: None  ? Collection Time: 11/15/21  7:33 PM  ?Result Value Ref Range  ? ABO/RH(D)    ?  O POS ?Performed at [facility], [place] [address] [place], [place] [address] ?  ?Resp Panel by RT-PCR (Flu A&B, Covid) Nasopharyngeal Swab     Status: None  ? Collection Time: 11/15/21  9:43 PM  ? Specimen: Nasopharyngeal Swab; Nasopharyngeal(NP) swabs in vial transport medium  ?Result Value Ref Range  ? SARS Coronavirus 2 by RT PCR NEGATIVE NEGATIVE  ?  Comment: (NOTE) ?SARS-CoV-2 target nucleic acids are NOT DETECTED. ? ?The SARS-CoV-2 RNA is generally detectable in upper respiratory ?specimens during the acute phase of infection. The  lowest ?concentration of SARS-CoV-2 viral copies this assay can detect is ?138 copies/mL. A negative result does not preclude SARS-Cov-2 ?infection and should not be used as the sole basis for treatment or ?other patient management decisions. A negative result may occur with  ?improper specimen collection/handling, submission of specimen  other ?than nasopharyngeal swab, presence of viral mutation(s) within the ?areas targeted by this assay, and inadequate number of viral ?copies(<138 copies/mL). A negative result must be combined with ?clinical observations, patient history, and epidemiological ?information. The expected result is Negative. ? ?Fact Sheet for Patients:  ?EntrepreneurPulse.com.au ? ?Fact Sheet for Healthcare Providers:  ?IncredibleEmployment.be ? ?This test is no t yet approved or cleared by the [place] FDA and  ?has been authorized for detection and/or diagnosis of SARS-CoV-2 by ?FDA under an Emergency Use Authorization (EUA). This EUA will remain  ?in effect (meaning this test can be used) for the duration of the ?COVID-19 declaration under Section 564(b)(1) of the Act, 21 ?U.S.C.section 360bbb-3(b)(1), unless the authorization is terminated  ?or revoked sooner.  ? ? ?  ? Influenza A by PCR NEGATIVE NEGATIVE  ? Influenza B by PCR NEGATIVE NEGATIVE  ?  Comment: (NOTE) ?The Xpert Xpress SARS-CoV-2/FLU/RSV plus assay is intended as an aid ?in the diagnosis of influenza from Nasopharyngeal swab specimens and ?should not be used as a sole basis for treatment. Nasal washings and ?aspirates are unacceptable for Xpert Xpress SARS-CoV-2/FLU/RSV ?testing. ? ?Fact Sheet for Patients: ?EntrepreneurPulse.com.au ? ?Fact Sheet for Healthcare Providers: ?IncredibleEmployment.be ? ?This test is not yet approved or cleared by the [place] FDA and ?has been authorized for detection and/or diagnosis of SARS-CoV-2 by ?FDA under an Emergency  Use Authorization (EUA). This EUA will remain ?in effect (meaning this test can be used) for the duration of the ?COVID-19 declaration under Section 564(b)(1) of the Act, 21 U.S.C. ?section 360bbb-3(b)(1), unless the au

## 2021-11-15 NOTE — ED Provider Notes (Signed)
?[facility] ?Provider Note ? ? ?CSN: [id number] ?Arrival date & time: 11/15/21  1708 ? ?  ? ?History ? ?Chief Complaint  ?Patient presents with  ? Epistaxis  ? ? ?Shawn York is a 46 y.o. male. ? ?47 year old male with prior medical history as detailed below presents for evaluation.  Patient with left-sided epistaxis. ? ?Patient with initial onset of bleeding from the left nare approximately 3 days ago. ? ?Patient was seen by Dr. Fredric Dine earlier today in the clinic.  No clear source of bleeding was identified. ?Patient reports that this evening bleeding recurred. ?He came to the ED secondary to continued bleeding despite packing in place and despite pressure being applied around it. ? ?He denies other complaint ? ?The history is provided by the patient and medical records.  ?Epistaxis ?Location:  L nare ?Severity:  Moderate ?Duration:  2 hours ?Timing:  Rare ?Progression:  Worsening ?Chronicity:  New ?Context: hypertension   ?Relieved by:  Nothing ? ?  ? ?Home Medications ?Prior to Admission medications   ?Medication Sig Start Date End Date Taking? Authorizing Provider  ?benazepril (LOTENSIN) 10 MG tablet Take 1 tablet (10 mg total) by mouth at bedtime. 02/18/21   Marrian Salvage, FNP  ?busPIRone (BUSPAR) 30 MG tablet Take 1 tablet (30 mg total) by mouth 2 (two) times daily. 04/29/21 07/28/21  Thayer Headings, PMHNP  ?diphenhydrAMINE (BENADRYL ALLERGY) 25 MG tablet Take 50 mg one hour prior to CT scan 11/08/21 11/09/21  Janith Lima, MD  ?doxazosin (CARDURA) 4 MG tablet Take 1 tablet (4 mg total) by mouth at bedtime. 04/29/21   Thayer Headings, [place]  ?EPINEPHrine 0.3 mg/0.3 mL IJ SOAJ injection Inject 0.3 mg into the muscle once as needed (for a severe, allergic reaction).     [provider]  ?hydrOXYzine (ATARAX/VISTARIL) 25 MG tablet Take 1 tablet (25 mg total) by mouth at bedtime as needed for anxiety. 05/27/21   Thayer Headings, PMHNP  ?levocetirizine (XYZAL)  5 MG tablet Take 1 tablet (5 mg total) by mouth every evening. 11/08/21   Janith Lima, MD  ?Oxcarbazepine (TRILEPTAL) 300 MG tablet Take 1 tablet (300 mg total) by mouth 2 (two) times daily. 08/21/16   Derrill Center, NP  ?vortioxetine HBr (TRINTELLIX) 10 MG TABS tablet Decrease to 10 mg tablet daily for one week, then stop 05/27/21   Thayer Headings, [place]  ?   ? ?Allergies    ?Iodinated contrast media, Penicillins, Shellfish allergy, and Shellfish-derived products   ? ?Review of Systems   ?Review of Systems  ?HENT:  Positive for nosebleeds.   ?All other systems reviewed and are negative. ? ?Physical Exam ?Updated Vital Signs ?BP (!) 164/114   Pulse 68   Temp 97.9 ?F (36.6 ?C) (Oral)   Resp 16   SpO2 98%  ?Physical Exam ?Vitals and nursing note reviewed.  ?Constitutional:   ?   General: He is not in acute distress. ?   Appearance: Normal appearance. He is well-developed.  ?HENT:  ?   Head: Normocephalic and atraumatic.  ?   Nose:  ?   Comments: Significant epistaxis to the left nare. ?Eyes:  ?   Conjunctiva/sclera: Conjunctivae normal.  ?   Pupils: Pupils are equal, round, and reactive to light.  ?Cardiovascular:  ?   Rate and Rhythm: Normal rate and regular rhythm.  ?   Heart sounds: Normal heart sounds.  ?Pulmonary:  ?   Effort: Pulmonary effort is normal. No respiratory distress.  ?  Breath sounds: Normal breath sounds.  ?Abdominal:  ?   General: There is no distension.  ?   Palpations: Abdomen is soft.  ?   Tenderness: There is no abdominal tenderness.  ?Musculoskeletal:     ?   General: No deformity. Normal range of motion.  ?   Cervical back: Normal range of motion and neck supple.  ?Skin: ?   General: Skin is warm and dry.  ?Neurological:  ?   General: No focal deficit present.  ?   Mental Status: He is alert and oriented to person, place, and time.  ? ? ?ED Results / Procedures / Treatments   ?Labs ?(all labs ordered are listed, but only abnormal results are displayed) ?Labs Reviewed  ?CBC WITH  DIFFERENTIAL/PLATELET - Abnormal; Notable for the following components:  ?    Result Value  ? RBC 4.10 (*)   ? All other components within normal limits  ?RESP PANEL BY RT-PCR (FLU A&B, COVID) [id number]  ?BASIC METABOLIC PANEL  ?PROTIME-INR  ?TYPE AND SCREEN  ?TROPONIN I (HIGH SENSITIVITY)  ? ? ?EKG ?EKG Interpretation ? ?Date/Time:  Monday November 15 2021 19:41:59 EDT ?Ventricular Rate:  87 ?PR Interval:  141 ?QRS Duration: 100 ?QT Interval:  373 ?QTC Calculation: 449 ?R Axis:   74 ?Text Interpretation: Sinus rhythm Confirmed by Dene Gentry [phone]) on 11/15/2021 7:44:26 PM ? ?Radiology ?DG Chest Port 1 View ? ?Result Date: 11/15/2021 ?CLINICAL DATA:  Chest pain. EXAM: PORTABLE CHEST 1 VIEW COMPARISON:  November 13, 2021. FINDINGS: The heart size and mediastinal contours are within normal limits. Both lungs are clear. The visualized skeletal structures are unremarkable. IMPRESSION: No active disease. Electronically Signed   By: Marijo Conception M.D.   On: 11/15/2021 19:55   ? ?Procedures ?.Epistaxis Management ? ?Date/Time: 11/15/2021 11:30 PM ?Performed by: Valarie Merino, MD ?Authorized by: Valarie Merino, MD  ? ?Consent:  ?  Consent obtained:  Verbal ?  Consent given by:  Patient ?  Risks, benefits, and alternatives were discussed: yes   ?  Risks discussed:  Bleeding ?  Alternatives discussed:  No treatment, delayed treatment, alternative treatment, referral and observation ?Universal protocol:  ?  Immediately prior to procedure, a time out was called: yes   ?  Patient identity confirmed:  Verbally with patient ?Anesthesia:  ?  Anesthesia method:  None ?Procedure details:  ?  Treatment site:  Unable to specify ?  Repair method: Initially 7.5 cm rapid Rhino placed, this was then replaced with a rapid Rhino 900. ?  Treatment episode: recurring   ?Post-procedure details:  ?  Assessment:  No improvement ?  Procedure completion:  Tolerated ?Comments:  ?   Initial placement of rapid Rhino 7.5 cm did not create hemostasis.   Rapid Rhino 900 was then placed with insufflation of both posterior and anterior balloons.  Again hemostasis was not created.  ? ? ?Medications Ordered in ED ?Medications  ?HYDROmorphone (DILAUDID) injection 1 mg (has no administration in time range)  ?morphine (PF) 4 MG/ML injection 4 mg (4 mg Intravenous Given 11/15/21 1952)  ?labetalol (NORMODYNE) injection 5 mg (5 mg Intravenous Given 11/15/21 1952)  ?labetalol (NORMODYNE) injection 10 mg (10 mg Intravenous Given 11/15/21 2025)  ?morphine (PF) 4 MG/ML injection 4 mg (4 mg Intravenous Given 11/15/21 2138)  ?labetalol (NORMODYNE) injection 10 mg (10 mg Intravenous Given 11/15/21 2153)  ?hydrALAZINE (APRESOLINE) injection 5 mg (5 mg Intravenous Given 11/15/21 2252)  ? ? ?ED Course/ Medical Decision Making/ A&P ?  ?                        ?  Medical Decision Making ?Amount and/or Complexity of Data Reviewed ?Labs: ordered. ? ?Risk ?Prescription drug management. ?Decision regarding hospitalization. ? ? ? ?Medical Screen Complete ? ?This patient presented to the ED with complaint of epistaxis. ? ?This complaint involves an extensive number of treatment options. The initial differential diagnosis includes, but is not limited to, epistaxis ? ?This presentation is: Acute, Self-Limited, Previously Undiagnosed, Uncertain Prognosis, Complicated, Systemic Symptoms, and Threat to Life/Bodily Function ? ?Patient with left-sided epistaxis that has been an ongoing issue for the last several days ? ?Patient with packing in place with recurrent nosebleed to the left nare. ? ?Packing removed. ? ?Initial placement of rapid Rhino 7.5 did not create hemostasis.  This was then replaced with a rapid Rhino 900 with insufflation of both posterior and anterior balloons.  Again unfortunately hemostasis is not created. ? ?Case discussed with Dr. Fredric Dine (ENT) who is in route to the ED to repack the patient.  Patient may benefit from evaluation by neuro interventional radiology in the a.m. for  possible embolization of the sphenopalatine artery. ? ?Hospitalist services were case will evaluate for overnight observation given his significant bleeding and elevated blood pressures. ? ?Co morbidities that co

## 2021-11-15 NOTE — ED Provider Triage Note (Signed)
Emergency Medicine Provider Triage Evaluation Note ? ?Shawn York , a 46 y.o. male  was evaluated in triage.  Pt complains of epistaxis worsening today.  Patient notes he has been evaluated by his ears nose and throat specialist for his symptoms several times since its onset 1 week ago.  Patient was evaluated by his ENT specialist today and was sent home after resolution of nasal bleeding through [facility].  Patient called his ENT specialist office and notified that he was still having bleeding and they told him to come to the ED for further evaluation of his symptoms due to not having the correct size balloon.  Patient notes associated mild chest pain, palpitations.  Denies shortness of breath, nausea, vomiting. ? ?Review of Systems  ?Positive: As per HPI above ?Negative:  ? ?Physical Exam  ?BP (!) 169/114 (BP Location: Right Arm)   Pulse 74   Temp 97.9 ?F (36.6 ?C) (Oral)   Resp 18   SpO2 100%  ?Gen:   Awake, no distress   ?Resp:  Normal effort  ?MSK:   Moves extremities without difficulty  ?Other:  Bleeding noted to left nare with rhino rocket in place. Clot noted to right nare.  ? ?Medical Decision Making  ?Medically screening exam initiated at 6:53 PM.  Appropriate orders placed.  Jerman Tinnon was informed that the remainder of the evaluation will be completed by another provider, this initial triage assessment does not replace that evaluation, and the importance of remaining in the ED until their evaluation is complete. ? ? ?6:57 PM - Tech went into grab EKG, and patient noted that he was not having chest pain.  ? ?6:58 PM - Discussed with RN that patient is in need of a room. RN aware and working on room placement.  ?  ?Christiaan Strebeck A, PA-C ?11/15/21 1858 ? ?

## 2021-11-15 NOTE — Assessment & Plan Note (Signed)
Stable since 2012. Follows annually at [facility]. ?

## 2021-11-16 ENCOUNTER — Observation Stay (HOSPITAL_COMMUNITY): Payer: BC Managed Care – PPO

## 2021-11-16 ENCOUNTER — Observation Stay (HOSPITAL_COMMUNITY): Payer: BC Managed Care – PPO | Admitting: Certified Registered Nurse Anesthetist

## 2021-11-16 ENCOUNTER — Encounter (HOSPITAL_COMMUNITY): Payer: Self-pay | Admitting: Internal Medicine

## 2021-11-16 ENCOUNTER — Encounter (HOSPITAL_COMMUNITY)
Admission: EM | Disposition: A | Discharge status: Home / Self Care | Payer: Self-pay | Source: Home / Self Care | Attending: Internal Medicine

## 2021-11-16 ENCOUNTER — Other Ambulatory Visit (HOSPITAL_COMMUNITY): Payer: Self-pay | Admitting: Neuroradiology

## 2021-11-16 DIAGNOSIS — F418 Other specified anxiety disorders: Secondary | ICD-10-CM | POA: Diagnosis not present

## 2021-11-16 DIAGNOSIS — R040 Epistaxis: Secondary | ICD-10-CM | POA: Diagnosis present

## 2021-11-16 DIAGNOSIS — Z20822 Contact with and (suspected) exposure to covid-19: Secondary | ICD-10-CM | POA: Diagnosis present

## 2021-11-16 DIAGNOSIS — Z87891 Personal history of nicotine dependence: Secondary | ICD-10-CM | POA: Diagnosis not present

## 2021-11-16 DIAGNOSIS — Z880 Allergy status to penicillin: Secondary | ICD-10-CM | POA: Diagnosis not present

## 2021-11-16 DIAGNOSIS — Z91013 Allergy to seafood: Secondary | ICD-10-CM | POA: Diagnosis not present

## 2021-11-16 DIAGNOSIS — F339 Major depressive disorder, recurrent, unspecified: Secondary | ICD-10-CM | POA: Diagnosis present

## 2021-11-16 DIAGNOSIS — Z808 Family history of malignant neoplasm of other organs or systems: Secondary | ICD-10-CM | POA: Diagnosis not present

## 2021-11-16 DIAGNOSIS — Z85841 Personal history of malignant neoplasm of brain: Secondary | ICD-10-CM | POA: Diagnosis not present

## 2021-11-16 DIAGNOSIS — I10 Essential (primary) hypertension: Secondary | ICD-10-CM | POA: Diagnosis not present

## 2021-11-16 DIAGNOSIS — Z8249 Family history of ischemic heart disease and other diseases of the circulatory system: Secondary | ICD-10-CM | POA: Diagnosis not present

## 2021-11-16 DIAGNOSIS — Z91041 Radiographic dye allergy status: Secondary | ICD-10-CM | POA: Diagnosis not present

## 2021-11-16 DIAGNOSIS — Z79899 Other long term (current) drug therapy: Secondary | ICD-10-CM | POA: Diagnosis not present

## 2021-11-16 DIAGNOSIS — D649 Anemia, unspecified: Secondary | ICD-10-CM | POA: Diagnosis not present

## 2021-11-16 DIAGNOSIS — F4310 Post-traumatic stress disorder, unspecified: Secondary | ICD-10-CM | POA: Diagnosis present

## 2021-11-16 DIAGNOSIS — C712 Malignant neoplasm of temporal lobe: Secondary | ICD-10-CM | POA: Diagnosis not present

## 2021-11-16 DIAGNOSIS — Z85828 Personal history of other malignant neoplasm of skin: Secondary | ICD-10-CM | POA: Diagnosis not present

## 2021-11-16 HISTORY — PX: IR ANGIO INTRA EXTRACRAN SEL INTERNAL CAROTID UNI L MOD SED: IMG5361

## 2021-11-16 HISTORY — PX: IR TRANSCATH/EMBOLIZ: IMG695

## 2021-11-16 HISTORY — PX: IR ANGIO INTRA EXTRACRAN SEL COM CAROTID INNOMINATE UNI R MOD SED: IMG5359

## 2021-11-16 HISTORY — PX: IR US GUIDE VASC ACCESS RIGHT: IMG2390

## 2021-11-16 HISTORY — PX: RADIOLOGY WITH ANESTHESIA: SHX6223

## 2021-11-16 HISTORY — PX: IR ANGIO EXTRACRAN SEL COM CAROTID INNOMINATE UNI R MOD SED: IMG5356

## 2021-11-16 HISTORY — PX: IR ANGIO EXTERNAL CAROTID SEL EXT CAROTID BILAT MOD SED: IMG5372

## 2021-11-16 HISTORY — PX: IR ANGIO VERTEBRAL SEL VERTEBRAL UNI R MOD SED: IMG5368

## 2021-11-16 LAB — COMPREHENSIVE METABOLIC PANEL
ALT: 18 U/L (ref 0–44)
AST: 16 U/L (ref 15–41)
Albumin: 4 g/dL (ref 3.5–5.0)
Alkaline Phosphatase: 31 U/L — ABNORMAL LOW (ref 38–126)
Anion gap: 7 (ref 5–15)
BUN: 13 mg/dL (ref 6–20)
CO2: 23 mmol/L (ref 22–32)
Calcium: 8.7 mg/dL — ABNORMAL LOW (ref 8.9–10.3)
Chloride: 105 mmol/L (ref 98–111)
Creatinine, Ser: 0.85 mg/dL (ref 0.61–1.24)
GFR, Estimated: >60 mL/min (ref >60)
Glucose, Bld: 173 mg/dL — ABNORMAL HIGH (ref 70–99)
Potassium: 4.1 mmol/L (ref 3.5–5.1)
Sodium: 135 mmol/L (ref 135–145)
Total Bilirubin: 0.5 mg/dL (ref 0.3–1.2)
Total Protein: 6.4 g/dL — ABNORMAL LOW (ref 6.5–8.1)

## 2021-11-16 LAB — HEMOGLOBIN AND HEMATOCRIT, BLOOD
HCT: 35.3 % — ABNORMAL LOW (ref 39.0–52.0)
Hemoglobin: 12.6 g/dL — ABNORMAL LOW (ref 13.0–17.0)

## 2021-11-16 LAB — CBC WITH DIFFERENTIAL/PLATELET
Abs Immature Granulocytes: 0.01 10*3/uL (ref 0.00–0.07)
Basophils Absolute: 0 10*3/uL (ref 0.0–0.1)
Basophils Relative: 0 %
Eosinophils Absolute: 0 10*3/uL (ref 0.0–0.5)
Eosinophils Relative: 0 %
HCT: 34.2 % — ABNORMAL LOW (ref 39.0–52.0)
Hemoglobin: 12.2 g/dL — ABNORMAL LOW (ref 13.0–17.0)
Immature Granulocytes: 0 %
Lymphocytes Relative: 15 %
Lymphs Abs: 0.7 10*3/uL (ref 0.7–4.0)
MCH: 33.9 pg (ref 26.0–34.0)
MCHC: 35.7 g/dL (ref 30.0–36.0)
MCV: 95 fL (ref 80.0–100.0)
Monocytes Absolute: 0.1 10*3/uL (ref 0.1–1.0)
Monocytes Relative: 3 %
Neutro Abs: 3.8 10*3/uL (ref 1.7–7.7)
Neutrophils Relative %: 82 %
Platelets: 172 10*3/uL (ref 150–400)
RBC: 3.6 MIL/uL — ABNORMAL LOW (ref 4.22–5.81)
RDW: 12.1 % (ref 11.5–15.5)
WBC: 4.6 10*3/uL (ref 4.0–10.5)
nRBC: 0 % (ref 0.0–0.2)

## 2021-11-16 LAB — TYPE AND SCREEN
ABO/RH(D): O POS
Antibody Screen: NEGATIVE

## 2021-11-16 SURGERY — IR WITH ANESTHESIA
Anesthesia: General

## 2021-11-16 MED ORDER — OXCARBAZEPINE 300 MG PO TABS
300.0000 mg | ORAL_TABLET | Freq: Two times a day (BID) | ORAL | Status: DC
  Administered 2021-11-16 – 2021-11-17 (×2): 300 mg via ORAL
  Filled 2021-11-16 (×3): qty 1

## 2021-11-16 MED ORDER — LIDOCAINE HCL 1 % IJ SOLN
INTRAMUSCULAR | Status: AC
  Filled 2021-11-16: qty 20

## 2021-11-16 MED ORDER — POVIDONE-IODINE 10 % EX SWAB: 2.0000 "application " | Freq: Once | CUTANEOUS | Status: DC

## 2021-11-16 MED ORDER — ONDANSETRON HCL 4 MG/2ML IJ SOLN: 4.0000 mg | Freq: Four times a day (QID) | INTRAMUSCULAR | Status: DC | PRN

## 2021-11-16 MED ORDER — LIDOCAINE 2% (20 MG/ML) 5 ML SYRINGE
INTRAMUSCULAR | Status: DC | PRN
  Administered 2021-11-16: 60 mg via INTRAVENOUS

## 2021-11-16 MED ORDER — SODIUM CHLORIDE 0.9 % IV SOLN
2000.0000 mg | INTRAVENOUS | Status: DC
  Filled 2021-11-16: qty 20

## 2021-11-16 MED ORDER — MIDAZOLAM HCL 2 MG/2ML IJ SOLN
INTRAMUSCULAR | Status: DC | PRN
  Administered 2021-11-16: 2 mg via INTRAVENOUS

## 2021-11-16 MED ORDER — ORAL CARE MOUTH RINSE: 15.0000 mL | Freq: Once | OROMUCOSAL | Status: AC

## 2021-11-16 MED ORDER — LACTATED RINGERS IV SOLN: INTRAVENOUS | Status: DC

## 2021-11-16 MED ORDER — CHLORHEXIDINE GLUCONATE 0.12 % MT SOLN
OROMUCOSAL | Status: AC
  Administered 2021-11-16: 15 mL via OROMUCOSAL
  Filled 2021-11-16: qty 15

## 2021-11-16 MED ORDER — FENTANYL CITRATE (PF) 250 MCG/5ML IJ SOLN
INTRAMUSCULAR | Status: DC | PRN
  Administered 2021-11-16: 100 ug via INTRAVENOUS

## 2021-11-16 MED ORDER — IOHEXOL 300 MG/ML  SOLN
100.0000 mL | Freq: Once | INTRAMUSCULAR | Status: AC | PRN
  Administered 2021-11-16: 67 mL via INTRA_ARTERIAL

## 2021-11-16 MED ORDER — PHENYLEPHRINE 40 MCG/ML (10ML) SYRINGE FOR IV PUSH (FOR BLOOD PRESSURE SUPPORT)
PREFILLED_SYRINGE | INTRAVENOUS | Status: DC | PRN
  Administered 2021-11-16 (×2): 80 ug via INTRAVENOUS
  Administered 2021-11-16: 160 ug via INTRAVENOUS
  Administered 2021-11-16: 80 ug via INTRAVENOUS

## 2021-11-16 MED ORDER — CHLORHEXIDINE GLUCONATE 0.12 % MT SOLN: 15.0000 mL | Freq: Once | OROMUCOSAL | Status: AC

## 2021-11-16 MED ORDER — ONDANSETRON HCL 4 MG/2ML IJ SOLN
INTRAMUSCULAR | Status: DC | PRN
  Administered 2021-11-16: 4 mg via INTRAVENOUS

## 2021-11-16 MED ORDER — ACETAMINOPHEN 650 MG RE SUPP
650.0000 mg | RECTAL | Status: DC | PRN
  Filled 2021-11-16: qty 1

## 2021-11-16 MED ORDER — DEXTROSE IN LACTATED RINGERS 5 % IV SOLN: INTRAVENOUS | Status: DC

## 2021-11-16 MED ORDER — SUGAMMADEX SODIUM 200 MG/2ML IV SOLN
INTRAVENOUS | Status: DC | PRN
  Administered 2021-11-16: 300 mg via INTRAVENOUS

## 2021-11-16 MED ORDER — EPHEDRINE SULFATE (PRESSORS) 50 MG/ML IJ SOLN
INTRAMUSCULAR | Status: DC | PRN
  Administered 2021-11-16 (×2): 5 mg via INTRAVENOUS
  Administered 2021-11-16: 10 mg via INTRAVENOUS
  Administered 2021-11-16: 5 mg via INTRAVENOUS

## 2021-11-16 MED ORDER — ACETAMINOPHEN 325 MG PO TABS
650.0000 mg | ORAL_TABLET | ORAL | Status: DC | PRN
  Administered 2021-11-17: 650 mg via ORAL
  Filled 2021-11-16 (×2): qty 2

## 2021-11-16 MED ORDER — PROPOFOL 10 MG/ML IV BOLUS
INTRAVENOUS | Status: DC | PRN
  Administered 2021-11-16: 200 mg via INTRAVENOUS

## 2021-11-16 MED ORDER — IOHEXOL 300 MG/ML  SOLN
100.0000 mL | Freq: Once | INTRAMUSCULAR | Status: AC | PRN
  Administered 2021-11-16: 40 mL via INTRAVENOUS

## 2021-11-16 MED ORDER — DEXAMETHASONE SODIUM PHOSPHATE 10 MG/ML IJ SOLN
INTRAMUSCULAR | Status: DC | PRN
Start: 2021-11-16 — End: 2021-11-16
  Administered 2021-11-16: 10 mg via INTRAVENOUS

## 2021-11-16 MED ORDER — NITROGLYCERIN 1 MG/10 ML FOR IR/CATH LAB
INTRA_ARTERIAL | Status: AC | PRN
Start: 2021-11-16 — End: 2021-11-16
  Administered 2021-11-16: 3 mL via INTRA_ARTERIAL

## 2021-11-16 MED ORDER — HYDRALAZINE HCL 20 MG/ML IJ SOLN: 10.0000 mg | INTRAMUSCULAR | Status: DC | PRN

## 2021-11-16 MED ORDER — HYDROMORPHONE HCL 1 MG/ML IJ SOLN
1.0000 mg | INTRAMUSCULAR | Status: DC | PRN
  Administered 2021-11-16 (×4): 1 mg via INTRAVENOUS
  Filled 2021-11-16 (×4): qty 1

## 2021-11-16 MED ORDER — ROCURONIUM BROMIDE 10 MG/ML (PF) SYRINGE
PREFILLED_SYRINGE | INTRAVENOUS | Status: DC | PRN
  Administered 2021-11-16: 10 mg via INTRAVENOUS
  Administered 2021-11-16: 20 mg via INTRAVENOUS
  Administered 2021-11-16: 70 mg via INTRAVENOUS
  Administered 2021-11-16 (×2): 20 mg via INTRAVENOUS

## 2021-11-16 MED ORDER — CLINDAMYCIN PHOSPHATE 900 MG/50ML IV SOLN
900.0000 mg | INTRAVENOUS | Status: AC
  Administered 2021-11-16: 900 mg via INTRAVENOUS
  Filled 2021-11-16: qty 50

## 2021-11-16 MED ORDER — ACETAMINOPHEN 160 MG/5ML PO SOLN
650.0000 mg | ORAL | Status: DC | PRN
  Filled 2021-11-16: qty 20.3

## 2021-11-16 MED ORDER — PHENYLEPHRINE HCL-NACL 20-0.9 MG/250ML-% IV SOLN
INTRAVENOUS | Status: DC | PRN
  Administered 2021-11-16: 25 ug/min via INTRAVENOUS

## 2021-11-16 MED ORDER — NITROGLYCERIN 1 MG/10 ML FOR IR/CATH LAB
INTRA_ARTERIAL | Status: AC | PRN
Start: 2021-11-16 — End: 2021-11-16
  Administered 2021-11-16: 2 mL via INTRA_ARTERIAL

## 2021-11-16 MED ORDER — BENAZEPRIL HCL 20 MG PO TABS
10.0000 mg | ORAL_TABLET | Freq: Two times a day (BID) | ORAL | Status: DC
  Administered 2021-11-16 – 2021-11-17 (×2): 10 mg via ORAL
  Filled 2021-11-16 (×2): qty 1

## 2021-11-16 MED ORDER — BUSPIRONE HCL 5 MG PO TABS
30.0000 mg | ORAL_TABLET | Freq: Two times a day (BID) | ORAL | Status: DC
  Administered 2021-11-16 – 2021-11-17 (×2): 30 mg via ORAL
  Filled 2021-11-16 (×2): qty 6

## 2021-11-16 MED ORDER — SUCCINYLCHOLINE CHLORIDE 200 MG/10ML IV SOSY
PREFILLED_SYRINGE | INTRAVENOUS | Status: DC | PRN
  Administered 2021-11-16: 100 mg via INTRAVENOUS

## 2021-11-16 MED ORDER — ALBUMIN HUMAN 5 % IV SOLN: INTRAVENOUS | Status: DC | PRN

## 2021-11-16 MED ORDER — TRANEXAMIC ACID-NACL 1000-0.7 MG/100ML-% IV SOLN
1000.0000 mg | INTRAVENOUS | Status: AC
  Administered 2021-11-16: 1000 mg via INTRAVENOUS

## 2021-11-16 MED ORDER — NITROGLYCERIN 1 MG/10 ML FOR IR/CATH LAB
INTRA_ARTERIAL | Status: AC
  Filled 2021-11-16: qty 10

## 2021-11-16 MED ORDER — ONDANSETRON HCL 4 MG PO TABS: 4.0000 mg | ORAL_TABLET | Freq: Four times a day (QID) | ORAL | Status: DC | PRN

## 2021-11-16 NOTE — Progress Notes (Signed)
?PROGRESS NOTE ? ?Shawn York PTW:[id number] DOB: Feb 25, 1976 DOA: 11/15/2021 ?PCP: Janith Lima, MD ? ? LOS: 0 days  ? 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  Patient's been seen by ENT multiple times.  He has had multiple packings both in the ER and ENT office.  Sent to the ER today by his ENT due to worsening bleeding from an already packed left nare.  Due to severity of bleeding he was admitted to the hospital and neuro interventional radiology was consulted for embolization ? ?Subjective / 24h Interval events: ?Seen in PACU, on [place], appears comfortable. No bleeding noted ? ?Assesement and Plan: ?Principal Problem: ?  Left-sided epistaxis ?Active Problems: ?  Contrast media allergy ?  Primary hypertension ?  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  Major depressive disorder, recurrent episode ([place]) ?  Epistaxis ? ? ?Assessment and Plan: ?Principal problem ?Left-sided epistaxis -patient was admitted to the hospital, ENT was consulted and attempted repeat nasal packing but had persistent bleeding.  Neuro IR was consulted and he was taken to the OR on 3/21 for attempted embolization, now status post bilateral sphenopalatine artery embolization with AG. Continue monitoring for now. ENT evaluated patient as well and removed Epistat with placement of Nasopore dissolving packet. He was empirically on antibiotics, can probably dc after today's dose.  ? ?Active problems ?Contrast media allergy -received emergent contrast allergy protocol. ? ?Primary hypertension -he is on benazepril, [place] at home.  Resume benazepril ? ?Depression-resume home medications ? 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 Follows annually at [facility]. ? ?Scheduled Meds: ?  HYDROmorphone  (DILAUDID) injection  1 mg Intravenous Once  ? lidocaine      ? nitroGLYCERIN      ? ?Continuous Infusions: ? cefTRIAXone (ROCEPHIN)  IV Stopped (11/16/21 0022)  ? dextrose 5% lactated ringers 50 mL/hr at 11/16/21 1304  ? ?PRN Meds:.acetaminophen **OR** acetaminophen (TYLENOL) oral liquid 160 mg/5 mL **OR** acetaminophen, hydrALAZINE, HYDROmorphone (DILAUDID) injection, iohexol, ondansetron **OR** ondansetron (ZOFRAN) IV ? ?Diet Orders (From admission, onward)  ? ?  Start     Ordered  ? 11/16/21 1312  Diet regular Room service appropriate? Yes; Fluid consistency: Thin  Diet effective now       ?Question Answer Comment  ?Room service appropriate? Yes   ?Fluid consistency: Thin   ?  ? 11/16/21 1311  ? ?  ?  ? ?  ? ? ?DVT prophylaxis: SCDs Start: 11/16/21 0053 ? ? ?Lab Results  ?Component Value Date  ? PLT 172 11/16/2021  ? ? ?  Code Status: Full Code ? ?Family Communication: no family at bedside  ? ?Status is: Inpatient. Likely home tomorrow if he has no further bleeding post embolization ? ?Level of care: Telemetry Medical ? ?Consultants:  ?ENT ?IR ? ?Procedures:  ?3/21 >> CEREBRAL ANGIOGRAPHY WITH BILATERAL SPHENOPALATINE ARTERY EMBOLIZATION, Dr Charma Igo ? ?Microbiology  ?none ? ?Antimicrobials: ?Ceftriaxone 3/20 >>   ? ? ?Objective: ?Vitals:  ? 11/16/21 1130 11/16/21 1145 11/16/21 1200 11/16/21 1215  ?BP: 119/84 123/86 (!) 132/93 (!) 133/91  ?Pulse: 80 79 74 78  ?Resp: '11 12 11 11  '$ ?Temp:    98.5 ?F (36.9 ?C)  ?TempSrc:      ?SpO2: 96% 96% 99% 98%  ?Weight:      ?Height:      ? ? ?  Intake/Output Summary (Last 24 hours) at 11/16/2021 1319 ?Last data filed at 11/16/2021 1200 ?Gross per 24 hour  ?Intake 1840.24 ml  ?Output 1850 ml  ?Net -9.76 ml  ? ?Wt Readings from Last 3 Encounters:  ?11/16/21 90.7 kg  ?11/13/21 89.4 kg  ?11/08/21 89.4 kg  ? ? ?Examination: ? ?Constitutional: NAD ?Eyes: no scleral icterus ?ENMT: Mucous membranes are moist.  ?Neck: normal, supple ?Respiratory: clear to auscultation bilaterally,  no wheezing, no crackles.  ?Cardiovascular: Regular rate and rhythm, no murmurs / rubs / gallops. No LE edema. ?Abdomen: non distended, no tenderness. Bowel sounds positive.  ?Musculoskeletal: no clubbing / cyanosis.  ?Skin: no rashes ?Neurologic: non focal  ? ?Data Reviewed: I have independently reviewed following labs and imaging studies  ? ?CBC ?Recent Labs  ?Lab 11/13/21 ?1158 11/15/21 ?1933 11/16/21 ?0016 11/16/21 ?0542  ?WBC 4.9 5.2  --  4.6  ?HGB 13.7 13.6 12.6* 12.2*  ?HCT 39.4 39.0 35.3* 34.2*  ?PLT 176 175  --  172  ?MCV 97.3 95.1  --  95.0  ?MCH 33.8 33.2  --  33.9  ?MCHC 34.8 34.9  --  35.7  ?RDW 12.1 12.0  --  12.1  ?LYMPHSABS  --  2.3  --  0.7  ?MONOABS  --  0.5  --  0.1  ?EOSABS  --  0.1  --  0.0  ?BASOSABS  --  0.0  --  0.0  ? ? ?Recent Labs  ?Lab 11/13/21 ?1158 11/15/21 ?1933 11/16/21 ?0542  ?NA 136 135 135  ?K 3.5 3.7 4.1  ?CL 104 102 105  ?CO2 '27 24 23  '$ ?GLUCOSE 87 99 173*  ?BUN '20 14 13  '$ ?CREATININE 0.95 0.97 0.85  ?CALCIUM 8.9 9.2 8.7*  ?AST  --   --  16  ?ALT  --   --  18  ?ALKPHOS  --   --  31*  ?BILITOT  --   --  0.5  ?ALBUMIN  --   --  4.0  ?INR  --  1.0  --   ? ? ?------------------------------------------------------------------------------------------------------------------ ?No results for input(s): CHOL, HDL, LDLCALC, TRIG, CHOLHDL, LDLDIRECT in the last 72 hours. ? ?No results found for: HGBA1C ?------------------------------------------------------------------------------------------------------------------ ?No results for input(s): TSH, T4TOTAL, T3FREE, THYROIDAB in the last 72 hours. ? ?Invalid input(s): FREET3 ? ?Cardiac Enzymes ?No results for input(s): CKMB, TROPONINI, MYOGLOBIN in the last 168 hours. ? ?Invalid input(s): CK ?------------------------------------------------------------------------------------------------------------------ ?No results found for: BNP ? ?CBG: ?No results for input(s): GLUCAP in the last 168 hours. ? ?Recent Results (from the past 240 hour(s))  ?Resp  Panel by RT-PCR (Flu A&B, Covid) Nasopharyngeal Swab     Status: None  ? Collection Time: 11/15/21  9:43 PM  ? Specimen: Nasopharyngeal Swab; Nasopharyngeal(NP) swabs in vial transport medium  ?Result Value Ref Range Status  ? SARS Coronavirus 2 by RT PCR NEGATIVE NEGATIVE Final  ?  Comment: (NOTE) ?SARS-CoV-2 target nucleic acids are NOT DETECTED. ? ?The SARS-CoV-2 RNA is generally detectable in upper respiratory ?specimens during the acute phase of infection. The lowest ?concentration of SARS-CoV-2 viral copies this assay can detect is ?138 copies/mL. A negative result does not preclude SARS-Cov-2 ?infection and should not be used as the sole basis for treatment or ?other patient management decisions. A negative result may occur with  ?improper specimen collection/handling, submission of specimen other ?than nasopharyngeal swab, presence of viral mutation(s) within the ?areas targeted by this assay, and inadequate number of viral ?copies(<138 copies/mL). A negative result must be combined  with ?clinical observations, patient history, and epidemiological ?information. The expected result is Negative. ? ?Fact Sheet for Patients:  ?EntrepreneurPulse.com.au ? ?Fact Sheet for Healthcare Providers:  ?IncredibleEmployment.be ? ?This test is no t yet approved or cleared by the [place] FDA and  ?has been authorized for detection and/or diagnosis of SARS-CoV-2 by ?FDA under an Emergency Use Authorization (EUA). This EUA will remain  ?in effect (meaning this test can be used) for the duration of the ?COVID-19 declaration under Section 564(b)(1) of the Act, 21 ?U.S.C.section 360bbb-3(b)(1), unless the authorization is terminated  ?or revoked sooner.  ? ? ?  ? Influenza A by PCR NEGATIVE NEGATIVE Final  ? Influenza B by PCR NEGATIVE NEGATIVE Final  ?  Comment: (NOTE) ?The Xpert Xpress SARS-CoV-2/FLU/RSV plus assay is intended as an aid ?in the diagnosis of influenza from Nasopharyngeal  swab specimens and ?should not be used as a sole basis for treatment. Nasal washings and ?aspirates are unacceptable for Xpert Xpress SARS-CoV-2/FLU/RSV ?testing. ? ?Fact Sheet for Patients: ?https://www.f

## 2021-11-16 NOTE — Progress Notes (Signed)
Patient arrived from PACU. A+Ox4, oriented to room. Pain of 8, pain meds given. ?

## 2021-11-16 NOTE — Progress Notes (Signed)
Patient arrived on the unit, accompanied by an Therapist, sports. Patient alert and oriented x4. Left sided nose bleed continues. Vital signs remains elevated despite multiple anti-hypertensive IV meds administered at ED. Saline packed pressure on the left sided nostril reinforced. Patient wiped down with CHG wipe. Will continue monitoring. ?

## 2021-11-16 NOTE — Transfer of Care (Signed)
Immediate Anesthesia Transfer of Care Note ? ?Patient: Shawn York ? ?Procedure(s) Performed: IR WITH ANESTHESIA ? ?Patient Location: PACU ? ?Anesthesia Type:General ? ?Level of Consciousness: drowsy, patient cooperative and responds to stimulation ? ?Airway & Oxygen Therapy: Patient Spontanous Breathing ? ?Post-op Assessment: Report given to RN and Post -op Vital signs reviewed and stable ? ?Post vital signs: Reviewed and stable ? ?Last Vitals:  ?Vitals Value Taken Time  ?BP 126/75 11/16/21 1112  ?Temp    ?Pulse 78 11/16/21 1114  ?Resp 10 11/16/21 1114  ?SpO2 97 % 11/16/21 1114  ?Vitals shown include unvalidated device data. ? ?Last Pain:  ?Vitals:  ? 11/16/21 0742  ?TempSrc: Oral  ?PainSc: 6   ?   ? ?Patients Stated Pain Goal: 3 (11/16/21 4383) ? ?Complications: No notable events documented. ?

## 2021-11-16 NOTE — Procedures (Signed)
INTERVENTIONAL NEURORADIOLOGY BRIEF POSTPROCEDURE NOTE ? ?CEREBRAL ANGIOGRAPHY WITH BILATERAL SPHENOPALATINE ARTERY EMBOLIZATION  ?  ?Attending: Dr. Pedro Earls ?  ?Assistant: None.  ?  ?Diagnosis: Epistaxis.  ?  ?Access site: Distal right radial artery.  ?  ?Access closure: Inflatable band.  ?  ?Anesthesia: General anesthesia.  ?  ?Medication used: Refer to anesthesia documentation. ?  ?Complications: None.  ?  ?Estimated blood loss: Negligible.  ?  ?Specimen: None.  ?  ?Bilateral sphenopalatine artery embolization performed with polyvinyl alcohol particles 250-350 microns.  No evidence of nontarget embolization.  .  ?  ?The patient tolerated the procedure well without incident or complication and is in stable condition. ? ?  ?

## 2021-11-16 NOTE — Anesthesia Preprocedure Evaluation (Addendum)
Anesthesia Evaluation  ?Patient identified by MRN, date of birth, ID band ?Patient awake ? ? ? ?Reviewed: ?Allergy & Precautions, NPO status , Patient's Chart, lab work & pertinent test results, Unable to perform ROS - Chart review only ? ?Airway ?Mallampati: III ? ?TM Distance: >3 FB ?Neck ROM: Full ? ?Mouth opening: Limited Mouth Opening ? Dental ? ?(+) Dental Advisory Given, Teeth Intact ?  ?Pulmonary ?neg pulmonary ROS, former smoker,  ?  ?Pulmonary exam normal ? ? ? ? ? ? ? Cardiovascular ?hypertension, Pt. on medications ?Normal cardiovascular exam ? ? ?  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  ? GI/Hepatic ?negative GI ROS, Neg liver ROS,   ?Endo/Other  ?negative endocrine ROS ? Renal/GU ?negative Renal ROS  ? ?  ?Musculoskeletal ?negative musculoskeletal ROS ?(+)  ? Abdominal ?  ?Peds ? Hematology ? ?(+) Blood dyscrasia, anemia ,   ?Anesthesia Other Findings ? ? Reproductive/Obstetrics ? ?  ? ? ? ? ? ? ? ? ? ? ? ? ? ?  ?  ? ? ? ? ? ? ?Anesthesia Physical ?Anesthesia Plan ? ?ASA: 3 ? ?Anesthesia Plan: General  ? ?Post-op Pain Management: Minimal or no pain anticipated  ? ?Induction: Intravenous ? ?PONV Risk Score and Plan: 2 and Treatment may vary due to age or medical condition, Ondansetron, Dexamethasone and Midazolam ? ?Airway Management Planned: Oral ETT ? ?Additional Equipment: None ? ?Intra-op Plan:  ? ?Post-operative Plan: Extubation in OR ? ?Informed Consent: I have reviewed the patients History and Physical, chart, labs and discussed the procedure including the risks, benefits and alternatives for the proposed anesthesia with the patient or authorized representative who has indicated his/her understanding and acceptance.  ? ? ? ?Dental advisory given ? ?Plan Discussed with:  ? ?Anesthesia Plan Comments:   ? ? ? ? ? ?Anesthesia Quick Evaluation ? ?

## 2021-11-16 NOTE — Anesthesia Postprocedure Evaluation (Signed)
Anesthesia Post Note ? ?Patient: Shawn York ? ?Procedure(s) Performed: IR WITH ANESTHESIA ? ?  ? ?Patient location during evaluation: PACU ?Anesthesia Type: General ?Level of consciousness: awake and alert ?Pain management: pain level controlled ?Vital Signs Assessment: post-procedure vital signs reviewed and stable ?Respiratory status: spontaneous breathing, nonlabored ventilation and respiratory function stable ?Cardiovascular status: blood pressure returned to baseline and stable ?Postop Assessment: no apparent nausea or vomiting ?Anesthetic complications: no ? ? ?No notable events documented. ? ?Last Vitals:  ?Vitals:  ? 11/16/21 1215 11/16/21 1252  ?BP: (!) 133/91 (!) 154/102  ?Pulse: 78 81  ?Resp: 11 10  ?Temp: 36.9 ?C 37.1 ?C  ?SpO2: 98% 99%  ?  ?Last Pain:  ?Vitals:  ? 11/16/21 1301  ?TempSrc:   ?PainSc: 8   ? ?Pain Goal: Patients Stated Pain Goal: 3 (11/16/21 1301) ? ?  ?  ?  ?  ?  ?  ?  ? ?Lidia Collum ? ? ? ? ?

## 2021-11-16 NOTE — Progress Notes (Signed)
WEDDING RING AND WATCH WAS GIVEN TO MOTHER DONNA Rumore WHO IS IN THE WAITING AREA. ?

## 2021-11-16 NOTE — Progress Notes (Signed)
ENT PROGRESS NOTE ? ? ?Subjective: ?Patient seen and examined in PACU. No bleeding noted from anterior nares.  ? ?Objective: ?Vital signs in last 24 hours: ?Temp:  [97.2 ?F (36.2 ?C)-99 ?F (37.2 ?C)] 98.9 ?F (37.2 ?C) (03/21 1115) ?Pulse Rate:  [64-92] 78 (03/21 1215) ?Resp:  [11-19] 11 (03/21 1215) ?BP: (119-190)/(75-126) 133/91 (03/21 1215) ?SpO2:  [92 %-100 %] 98 % (03/21 1215) ?Weight:  [84.9 kg-90.7 kg] 90.7 kg (03/21 0741) ? ?CONSTITUTIONAL: well developed, nourished, no distress and alert and oriented x 3 ?PULMONARY/CHEST WALL: effort normal and no stridor, no stertor, no dysphonia ?HENT: Head : normocephalic and atraumatic ?Nose: Epistat in place in left nare with dried blood noted. This was removed. Following removal, no evidence of active bleeding appreciated. Intranasal mucosa excoriated on left.  ?Mouth/Throat:  Mouth: no bleeding ?Mucous membranes: normal ?Recent Labs  ?  11/15/21 ?1933 11/16/21 ?0542  ?NA 135 135  ?K 3.7 4.1  ?CL 102 105  ?CO2 24 23  ?GLUCOSE 99 173*  ?BUN 14 13  ?CREATININE 0.97 0.85  ?CALCIUM 9.2 8.7*  ? ? ?Medications: I have reviewed the patient's current medications. ? ?New Imaging: None ? ? ?Assessment/Plan: ?Shawn York is a 46 y/o M with left epistaxis, now s/p embolization with neuro IR. Epistat was removed in PACU without issue. No active bleeding appreciated. Nasopore dissolving packing placed in left nare, this does not need to be removed.  ?-Recommend application of saline to bilateral nares every 4 hours. ?-Afrin as needed for bleeding ?-Will contact patient to set up FU with ENT outpatient in 1 week. ? ?Thank you for allowing me to participate in the care of this patient. Please do not hesitate to contact me with any questions or concerns.  ? ?[place], DO ?Otolaryngology ?[facility] ?Cell: [phone] ? ? ? LOS: 0 days  ? ? ?[place], DO ?[facility] ?11/16/2021, 12:48 PM ? ? ? ? ? ?

## 2021-11-16 NOTE — Consult Note (Addendum)
? ?I was called overnight about this patient by Dr. Francia Greaves. I was informed  he continued to ooze through left nare despite packing. He informed be this has been going on for 4 days. On further discussion with Dr. Fredric Dine, she relayed concerns that this is a large volume bleed but also that the patient has a history of contrast allergy that is listed as anaphylaxis. Decision made to start contrast allergy prep and check H&H to evaluate blood loss. The hospitalist was instructed to call us in case there is a drop in H&H and we will activate call team and come for embo overnight. Otherwise, we will address it first thing in the morning.   ?Dr. Erven Colla de Sindy Messing  ? ? ?====== ?Chief Complaint: ?Patient was seen in consultation today for epistaxis embolization ?Chief Complaint  ?Patient presents with  ? Epistaxis  ? at the request of Dr Fredric Dine ? ? ?Supervising Physician: Pedro Earls ? ?Patient Status: [facility] - In-pt ? ?History of Present Illness: ?Shawn York is a 46 y.o. male  ? ?Left sided epistaxis ?Hx HTN; anaplastic oligodendroglioma- post craniotomy 01/2011 ?Started 4 days ago--spontaneous bleeding ?Seen by ENT treatment has been refractory ?To ED--- 10 cm Merisel removed and 2 rhino rockets placed---bleeding continues ?No thinners ? ?Evaluation with Dr Fredric Dine: ?I reviewed options with patient and his wife, including operative intervention with nasal endoscopy and cauterization versus embolization.  Risks, benefits were reviewed.  At this time, they would like to proceed with embolization, which I agree is likely to be most successful based on the severity of the bleed.  Neuro interventional radiology was consulted in the emergency department by ED physician, however as only 1 provider is available, they will assess patient in the morning for embolization.  Patient is to be admitted by hospital medicine for medical management. ? ?Dr Tennis Must Sindy Messing has reviewed chart and  approves procedure ?Scheduled now for Bilateral Epistaxis embolization with anesthesia ? ? ?Past Medical History:  ?Diagnosis Date  ? Avascular necrosis ([place])   ? Brain cancer ([place]) 02/25/2011  ? grade III anaplastic ologodendrglioma  ? Hypertension   ? PTSD (post-traumatic stress disorder)   ? Seizures ([place])   ? ? ?Past Surgical History:  ?Procedure Laterality Date  ? BASAL CELL CARCINOMA EXCISION  1995  ? CRANIOTOMY FOR TUMOR Right 02/25/2011  ? WISDOM TOOTH EXTRACTION    ? ? ?Allergies: ?Iodinated contrast media, Penicillins, Shellfish allergy, and Shellfish-derived products ? ?Medications: ?Prior to Admission medications   ?Medication Sig Start Date End Date Taking? Authorizing Provider  ?acetaminophen (TYLENOL) 500 MG tablet Take 1,000 mg by mouth every 6 (six) hours as needed for moderate pain or headache.   Yes [provider]  ?benazepril (LOTENSIN) 10 MG tablet Take 1 tablet (10 mg total) by mouth at bedtime. ?Patient taking differently: Take 10 mg by mouth 2 (two) times daily. 02/18/21  Yes Marrian Salvage, FNP  ?busPIRone (BUSPAR) 30 MG tablet Take 1 tablet (30 mg total) by mouth 2 (two) times daily. 04/29/21 11/15/21 Yes Thayer Headings, PMHNP  ?doxazosin (CARDURA) 4 MG tablet Take 1 tablet (4 mg total) by mouth at bedtime. 04/29/21  Yes Thayer Headings, [place]  ?EPINEPHrine 0.3 mg/0.3 mL IJ SOAJ injection Inject 0.3 mg into the muscle once as needed (for a severe, allergic reaction).    Yes [provider]  ?Oxcarbazepine (TRILEPTAL) 300 MG tablet Take 1 tablet (300 mg total) by mouth 2 (two) times daily. 08/21/16  Yes Lewis,  Marcy Panning, NP  ?hydrOXYzine (ATARAX/VISTARIL) 25 MG tablet Take 1 tablet (25 mg total) by mouth at bedtime as needed for anxiety. ?Patient not taking: Reported on 11/15/2021 05/27/21   Thayer Headings, PMHNP  ?levocetirizine (XYZAL) 5 MG tablet Take 1 tablet (5 mg total) by mouth every evening. ?Patient not taking: Reported on 11/15/2021 11/08/21   Janith Lima, MD   ?vortioxetine HBr (TRINTELLIX) 10 MG TABS tablet Decrease to 10 mg tablet daily for one week, then stop ?Patient not taking: Reported on 11/15/2021 05/27/21   Thayer Headings, [place]  ?  ? ?Family History  ?Problem Relation Age of Onset  ? Cancer Other   ?     brain cancer  ? Cancer Father   ?     skin cancer  ? Cancer Maternal Grandmother 74  ?     brain cancer  ? Alcohol abuse Brother   ? Alzheimer's disease Paternal Grandmother   ? Dementia Paternal Grandmother   ? Heart disease Paternal Grandfather   ? ? ?Social History  ? ?Socioeconomic History  ? Marital status: Single  ?  Spouse name: Not on file  ? Number of children: Not on file  ? Years of education: Not on file  ? Highest education level: Not on file  ?Occupational History  ? Not on file  ?Tobacco Use  ? Smoking status: Former  ?  Types: Cigarettes  ?  Quit date: 02/25/2011  ?  Years since quitting: 10.7  ? Smokeless tobacco: Never  ?Substance and Sexual Activity  ? Alcohol use: Not Currently  ?  Alcohol/week: 14.0 standard drinks  ?  Types: 14 Cans of beer per week  ?  Comment: quit 11-08-2021  ? Drug use: No  ? Sexual activity: Yes  ?  Comment: E-cigarette users  ?Other Topics Concern  ? Not on file  ?Social History Narrative  ? Not on file  ? ?Social Determinants of Health  ? ?Financial Resource Strain: Not on file  ?Food Insecurity: Not on file  ?Transportation Needs: Not on file  ?Physical Activity: Not on file  ?Stress: Not on file  ?Social Connections: Not on file  ? ? ? ? ?Review of Systems: A 12 point ROS discussed and pertinent positives are indicated in the HPI above.  All other systems are negative. ? ?Review of Systems  ?Constitutional:  Negative for activity change, fatigue and fever.  ?HENT:  Positive for nosebleeds. Negative for trouble swallowing.   ?Respiratory:  Negative for cough and shortness of breath.   ?Cardiovascular:  Negative for chest pain.  ?Gastrointestinal:  Positive for nausea. Negative for abdominal pain.  ?Neurological:   Negative for weakness.  ?Psychiatric/Behavioral:  Negative for behavioral problems and confusion.   ? ?Vital Signs: ?BP 124/87 (BP Location: Right Arm)   Pulse 66   Temp 98.1 ?F (36.7 ?C) (Axillary)   Resp 13   Ht '6\' 1"'$  (1.854 m)   Wt 187 lb 2.7 oz (84.9 kg)   SpO2 92%   BMI 24.69 kg/m?  ? ?Physical Exam ?Vitals reviewed.  ?HENT:  ?   Nose:  ?   Comments: Packed left nare ?   Mouth/Throat:  ?   Mouth: Mucous membranes are moist.  ?Cardiovascular:  ?   Rate and Rhythm: Normal rate and regular rhythm.  ?   Heart sounds: Normal heart sounds.  ?Pulmonary:  ?   Effort: Pulmonary effort is normal.  ?   Breath sounds: Normal breath sounds. No wheezing.  ?Abdominal:  ?  Palpations: Abdomen is soft.  ?Musculoskeletal:     ?   General: Normal range of motion.  ?Skin: ?   General: Skin is warm.  ?Neurological:  ?   Mental Status: He is alert and oriented to person, place, and time.  ?Psychiatric:     ?   Behavior: Behavior normal.  ? ? ?Imaging: ?DG Chest 2 View ? ?Result Date: 11/13/2021 ?CLINICAL DATA:  Chest pressure and shortness of breath.  Nosebleed. EXAM: CHEST - 2 VIEW COMPARISON:  Chest x-ray dated April 30, 2020. FINDINGS: The heart size and mediastinal contours are within normal limits. Both lungs are clear. The visualized skeletal structures are unremarkable. IMPRESSION: No active cardiopulmonary disease. Electronically Signed   By: Titus Dubin M.D.   On: 11/13/2021 11:53  ? ?MR HIP RIGHT WO CONTRAST ? ?Result Date: 10/24/2021 ?CLINICAL DATA:  Hip pain. Osteonecrosis suspected. Weakness after fall on ice. Fall 2022. EXAM: MR OF THE RIGHT HIP WITHOUT CONTRAST TECHNIQUE: Multiplanar, multisequence MR imaging was performed. No intravenous contrast was administered. COMPARISON:  Pelvis and right hip radiographs 10/12/2021 FINDINGS: Bones: There is serpiginous signal within the anterior superior through the posterosuperior portions of the right femoral head with central increased T1 and decreased T2 with  fat saturation marrow fat signal and peripheral decreased T1 and alternating increased and decreased T2 signal no significant surrounding marrow edema. This region of avascular necrosis measures up to approximately 4.

## 2021-11-16 NOTE — Anesthesia Procedure Notes (Signed)
Procedure Name: Intubation ?Date/Time: 11/16/2021 8:09 AM ?Performed by: Janace Litten, CRNA ?Pre-anesthesia Checklist: Patient identified, Emergency Drugs available, Suction available and Patient being monitored ?Patient Re-evaluated:Patient Re-evaluated prior to induction ?Oxygen Delivery Method: Circle System Utilized ?Preoxygenation: Pre-oxygenation with 100% oxygen ?Induction Type: IV induction and Rapid sequence ?Laryngoscope Size: Mac and 4 ?Grade View: Grade I ?Tube type: Oral ?Tube size: 7.5 mm ?Number of attempts: 1 ?Airway Equipment and Method: Stylet ?Placement Confirmation: ETT inserted through vocal cords under direct vision, positive ETCO2 and breath sounds checked- equal and bilateral ?Secured at: 23 cm ?Tube secured with: Tape ?Dental Injury: Teeth and Oropharynx as per pre-operative assessment  ? ? ? ? ?

## 2021-11-17 ENCOUNTER — Other Ambulatory Visit (HOSPITAL_COMMUNITY): Payer: Self-pay | Admitting: Neuroradiology

## 2021-11-17 ENCOUNTER — Encounter (HOSPITAL_COMMUNITY): Payer: Self-pay | Admitting: Neuroradiology

## 2021-11-17 ENCOUNTER — Encounter: Payer: Self-pay | Admitting: Internal Medicine

## 2021-11-17 ENCOUNTER — Ambulatory Visit: Payer: BC Managed Care – PPO | Admitting: Psychiatry

## 2021-11-17 DIAGNOSIS — R040 Epistaxis: Secondary | ICD-10-CM

## 2021-11-17 HISTORY — PX: IR NEURO EACH ADD'L AFTER BASIC UNI LEFT (MS): IMG5373

## 2021-11-17 HISTORY — PX: IR ANGIOGRAM FOLLOW UP STUDY: IMG697

## 2021-11-17 HISTORY — PX: IR NEURO EACH ADD'L AFTER BASIC UNI RIGHT (MS): IMG5374

## 2021-11-17 LAB — BASIC METABOLIC PANEL
Anion gap: 6 (ref 5–15)
BUN: 10 mg/dL (ref 6–20)
CO2: 24 mmol/L (ref 22–32)
Calcium: 8.6 mg/dL — ABNORMAL LOW (ref 8.9–10.3)
Chloride: 106 mmol/L (ref 98–111)
Creatinine, Ser: 0.79 mg/dL (ref 0.61–1.24)
GFR, Estimated: >60 mL/min (ref >60)
Glucose, Bld: 91 mg/dL (ref 70–99)
Potassium: 3.7 mmol/L (ref 3.5–5.1)
Sodium: 136 mmol/L (ref 135–145)

## 2021-11-17 LAB — CBC
HCT: 29.2 % — ABNORMAL LOW (ref 39.0–52.0)
Hemoglobin: 10.1 g/dL — ABNORMAL LOW (ref 13.0–17.0)
MCH: 33.8 pg (ref 26.0–34.0)
MCHC: 34.6 g/dL (ref 30.0–36.0)
MCV: 97.7 fL (ref 80.0–100.0)
Platelets: 142 10*3/uL — ABNORMAL LOW (ref 150–400)
RBC: 2.99 MIL/uL — ABNORMAL LOW (ref 4.22–5.81)
RDW: 12.3 % (ref 11.5–15.5)
WBC: 6.3 10*3/uL (ref 4.0–10.5)
nRBC: 0 % (ref 0.0–0.2)

## 2021-11-17 MED ORDER — BENAZEPRIL HCL 10 MG PO TABS: 10.0000 mg | ORAL_TABLET | Freq: Two times a day (BID) | ORAL | 2 refills | Status: DC

## 2021-11-17 NOTE — Progress Notes (Signed)
Referring Physician(s): Dr. Baldemar Lenis  Supervising Physician: Joana Reamer Kizzie Fantasia  Patient Status:  [facility] - In-pt  Chief Complaint:  Bilateral sphenopalatine artery embolization performed with polyvinyl alcohol particles.   Subjective:  Pt sitting upright in chair. He is A&O, calm and pleasant. He is in no distress. Wife is at bedside. Pt endorses mild pain to sinuses with some slight oozing, mild SOB on exertion, weakness and dizziness. He denies active bleeding, severe pain, N/V. He states he is eating and drinking normally.   Allergies: Iodinated contrast media, Penicillins, Shellfish allergy, and Shellfish-derived products  Medications: Prior to Admission medications   Medication Sig Start Date End Date Taking? Authorizing Provider  acetaminophen (TYLENOL) 500 MG tablet Take 1,000 mg by mouth every 6 (six) hours as needed for moderate pain or headache.   Yes [provider]  benazepril (LOTENSIN) 10 MG tablet Take 1 tablet (10 mg total) by mouth at bedtime. Patient taking differently: Take 10 mg by mouth 2 (two) times daily. 02/18/21  Yes Olive Bass, FNP  busPIRone (BUSPAR) 30 MG tablet Take 1 tablet (30 mg total) by mouth 2 (two) times daily. 04/29/21 11/15/21 Yes Corie Chiquito, PMHNP  doxazosin (CARDURA) 4 MG tablet Take 1 tablet (4 mg total) by mouth at bedtime. 04/29/21  Yes Corie Chiquito, PMHNP  EPINEPHrine 0.3 mg/0.3 mL IJ SOAJ injection Inject 0.3 mg into the muscle once as needed (for a severe, allergic reaction).    Yes [provider]  Oxcarbazepine (TRILEPTAL) 300 MG tablet Take 1 tablet (300 mg total) by mouth 2 (two) times daily. 08/21/16  Yes Oneta Rack, NP  hydrOXYzine (ATARAX/VISTARIL) 25 MG tablet Take 1 tablet (25 mg total) by mouth at bedtime as needed for anxiety. Patient not taking: Reported on 11/15/2021 05/27/21   Corie Chiquito, PMHNP  levocetirizine (XYZAL) 5 MG tablet Take 1 tablet (5 mg  total) by mouth every evening. Patient not taking: Reported on 11/15/2021 11/08/21   Etta Grandchild, MD  vortioxetine HBr (TRINTELLIX) 10 MG TABS tablet Decrease to 10 mg tablet daily for one week, then stop Patient not taking: Reported on 11/15/2021 05/27/21   Corie Chiquito, PMHNP     Vital Signs: BP (!) 83/60 (BP Location: Left Arm)   Pulse 92   Temp 97.7 F (36.5 C) (Oral)   Resp 18   Ht 6' (1.829 m)   Wt 200 lb (90.7 kg)   SpO2 100%   BMI 27.12 kg/m   Physical Exam Vitals reviewed.  Constitutional:      Appearance: Normal appearance. He is not ill-appearing.  HENT:     Head: Normocephalic and atraumatic.     Nose:     Comments: Residual material noted to medial edge of L nare. There is dried blood to edge of L nare. No active bleeding noted.     Mouth/Throat:     Mouth: Mucous membranes are moist.     Pharynx: Oropharynx is clear.  Eyes:     Extraocular Movements: Extraocular movements intact.     Pupils: Pupils are equal, round, and reactive to light.  Pulmonary:     Effort: Pulmonary effort is normal. No respiratory distress.  Skin:    General: Skin is warm and dry.     Comments: R radial puncture site is soft with no active bleeding and no appreciable pseudoaneurysm. Dressing is C/D/I   Neurological:     Mental Status: He is alert and oriented to person, place,  and time.  Psychiatric:        Mood and Affect: Mood normal.        Behavior: Behavior normal.        Thought Content: Thought content normal.        Judgment: Judgment normal.    Imaging: DG Chest 2 View  Result Date: 11/13/2021 CLINICAL DATA:  Chest pressure and shortness of breath.  Nosebleed. EXAM: CHEST - 2 VIEW COMPARISON:  Chest x-ray dated April 30, 2020. FINDINGS: The heart size and mediastinal contours are within normal limits. Both lungs are clear. The visualized skeletal structures are unremarkable. IMPRESSION: No active cardiopulmonary disease. Electronically Signed   By: Obie Dredge  M.D.   On: 11/13/2021 11:53   IR Transcath/Emboliz  Result Date: 11/16/2021 INDICATION: 46 year old male with intermittent epistaxis for approximately 4 days, persistent despite nasal packing. EXAM: ULTRASOUND-GUIDED VASCULAR ACCESS DIAGNOSTIC CEREBRAL ANGIOGRAM ENDOVASCULAR EPISTAXIS EMBOLIZATION COMPARISON:  None. MEDICATIONS: Clindamycin 900 mg IV. The antibiotic was administered within 1 hour of the procedure. ANESTHESIA/SEDATION: The procedure was performed in the general anesthesia. CONTRAST:  107 mL of Omnipaque 300 milligram/mL. FLUOROSCOPY: Radiation Exposure Index (as provided by the fluoroscopic device): 1,090 mGy Kerma. COMPLICATIONS: None immediate. TECHNIQUE: Informed written consent was obtained from the patient after a thorough discussion of the procedural risks, benefits and alternatives. All questions were addressed. Maximal Sterile Barrier Technique was utilized including caps, mask, sterile gowns, sterile gloves, sterile drape, hand hygiene and skin antiseptic. A timeout was performed prior to the initiation of the procedure. Using the modified Seldinger technique and a micropuncture kit, access was gained to the distal right radial artery at the anatomical snuffbox and a 6 French sheath was placed. Real-time ultrasound guidance was utilized for vascular access including the acquisition of a permanent ultrasound image documenting patency of the accessed vessel. Slow intra arterial infusion of 300 mcg nitroglycerin diluted in patient's own blood was performed. Then, a right radial artery angiogram was obtained via sheath side port. Normal brachial artery branching pattern seen. No significant anatomical variation. The right radial artery caliber is adequate for vascular access. Next, a 5 [place] Simmons 2 glide catheter was navigated over a 0.035" Terumo Glidewire into the right subclavian artery under fluoroscopic guidance. The catheter was then advanced into the aortic arch where the catheter  tip was reformed. Then, the catheter was placed into the left common carotid artery. Frontal and lateral angiograms of the neck were. Using biplane roadmap guidance, the catheter was placed into the left internal carotid artery. Frontal and lateral angiograms of the head and face were obtained. The catheter was then placed into the left external carotid artery. Frontal and lateral angiograms of the face/head were obtained. The catheter was then exchanged over the wire and under fluoroscopic guidance for a 6 French benchmark catheter which was placed into the left external carotid artery. Frontal and lateral angiograms of the head/face were obtained. Endovascular embolization of the left sphenopalatine artery was then carried out, as described below. The benchmark was exchanged over the wire and under fluoroscopy for a 5 French Simmons 2 glide catheter which was navigated over a 0.035" Terumo Glidewire into the right subclavian artery. The catheter tip was reformed in the aortic valve. The catheter was then navigated into the right vertebral artery. Frontal and lateral angiograms of the head and skull base were obtained. The catheter was then placed into the right common carotid artery. Frontal and lateral angiograms of the head were obtained followed by frontal and  angiograms of the neck. The catheter was then exchanged over the wire and under fluoroscopic guidance for a 6 French benchmark catheter which was placed into the right external carotid artery. Frontal and lateral angiograms of the head/face were obtained. Endovascular embolization of the right sphenopalatine artery was then carried out, as described below. FINDINGS: Right radial artery ultrasound and right radial artery angiogram: The caliber of the distal right radial artery is appropriate for angiogram access. The right radial artery and the right ulnar artery have normal course and caliber. No significant anatomical variants noted. Left CCA angiograms:  Cervical angiograms show normal course and caliber of the visualized left common carotid and internal carotid arteries. There are no significant stenoses. Left ICA angiograms: There is brisk vascular contrast filling of the left ACA and MCA vascular trees. Luminal caliber is smooth and tapering. No aneurysms or abnormally high-flow, early draining veins are seen. No regions of abnormal hypervascularity are noted. The visualized dural sinuses are patent. Small contribution of the ethmoidal branches of the ophthalmic artery to the nasal cavity is seen. Left ECA angiograms: Prominent arterial blush of the nasal septum, inferior and lateral walls of the left nasal cavity are seen from sphenopalatine artery. Minor contribution from the left facial artery. Right vertebral artery angiograms: The right vertebral artery, basilar artery and visualized portions of the bilateral PCA are normal in course and caliber. No aneurysms or abnormally high-flow, early draining veins are seen. No regions of abnormal hypervascularity are noted. The visualized dural sinuses are patent. Right CCA angiograms: Cervical angiograms show normal course and caliber of the visualized right common carotid and internal carotid arteries. There are no significant stenoses. Right CCA angiograms-cranial views: There is brisk vascular contrast filling of the right ACA and MCA vascular trees. Luminal caliber is smooth and tapering. No aneurysms or abnormally high-flow, early draining veins are seen. No regions of abnormal hypervascularity are noted. The visualized dural sinuses are patent. Contribution of the ethmoidal branches of the ophthalmic artery to the nasal cavity is seen. Right ECA angiograms: Prominent arterial blush of the nasal septum, inferior and lateral walls of the right nasal cavity and inferior aspect of the left nasal cavity are seen from sphenopalatine artery. Minor contribution from the right facial artery. PROCEDURE: Left  sphenopalatine artery embolization: Left external carotid artery angiogram were obtained with magnified frontal and lateral views of the face were obtained. Using biplane roadmap guidance, a Prowler select Plus microcatheter was navigated over a synchro select microguidewire into the left sphenopalatine artery. Frontal and lateral angiograms were obtained with microcatheter contrast injection. Endovascular embolization was then performed with 250-350 polyvinyl alcohol particles (PVA) in an iodinated contrast suspension. No evidence of nontarget embolization. The microcatheter was subsequently withdrawn. Left external carotid artery angiograms showed adequate embolization of the sphenopalatine artery with persistent supply of the nasal cavity by a greater palatine artery. Using biplane roadmap, attempted navigation of a new prowler select plus microcatheter over a synchro select microguidewire into the right greater palatine artery resulted in herniation of the guide catheter into the aortic arch. The guide catheter was then exchanged over the wire and under fluoroscopy for a 5 French Simmons 2 glide catheter which was navigated over a 0.035" Terumo Glidewire into the right subclavian artery. The catheter tip was reformed in the aortic arch. The catheter was then navigated into the left common carotid artery. The glide catheter was then exchanged over the wire and under fluoroscopy for a benchmark catheter which was placed into the  left external carotid artery. Frontal and lateral angiograms of the face were obtained. Using biplane roadmap guidance, a headway duo microcatheter was navigated over a synchro select microguidewire into the left greater palatine artery. Frontal and lateral angiograms were obtained with microcatheter contrast injection. Endovascular embolization was then performed with 250-350 polyvinyl alcohol particles (PVA) in an iodinated contrast suspension. No evidence of nontarget embolization. The  microcatheter was subsequently withdrawn. Left external carotid artery angiograms showed adequate embolization of the sphenopalatine artery and greater palatine arteries. Left internal carotid artery angiogram showed no evidence of nontarget embolization to the intracranial circulation. Right sphenopalatine artery embolization: Right external carotid artery angiogram were obtained with magnified frontal and lateral views of the face were obtained. Using biplane roadmap guidance, a Prowler select Plus microcatheter was navigated over a synchro select microguidewire into the right sphenopalatine artery. Frontal and lateral angiograms were obtained with microcatheter contrast injection. Endovascular embolization was then performed with 250-350 polyvinyl alcohol particles (PVA) in an iodinated contrast suspension. No evidence of nontarget embolization. The microcatheter was subsequently withdrawn. Right external carotid artery angiograms showed adequate embolization of the sphenopalatine artery. Right common carotid artery angiogram showed no evidence of nontarget embolization to the intracranial circulation. The guide catheter was subsequently withdrawn. The guide catheter was subsequently withdrawn. An inflatable band was placed and inflated over the right hand access site. The vascular sheath was withdrawn and the band was slowly deflated until brisk flow was noted through the arteriotomy site. At this point, the band was reinflated with additional 2 cc of air to obtain patent hemostasis. IMPRESSION: Successful and uncomplicated bilateral interbody and artery embolization for treatment of persistent recurrent epistaxis. PLAN: 1. Post angiogram care with radial band deflation protocol. 2. Further management per ENT. Electronically Signed   By: Baldemar Lenis M.D.   On: 11/16/2021 15:58   IR US Guide Vasc Access Right  Result Date: 11/16/2021 INDICATION: 46 year old male with intermittent epistaxis  for approximately 4 days, persistent despite nasal packing. EXAM: ULTRASOUND-GUIDED VASCULAR ACCESS DIAGNOSTIC CEREBRAL ANGIOGRAM ENDOVASCULAR EPISTAXIS EMBOLIZATION COMPARISON:  None. MEDICATIONS: Clindamycin 900 mg IV. The antibiotic was administered within 1 hour of the procedure. ANESTHESIA/SEDATION: The procedure was performed in the general anesthesia. CONTRAST:  107 mL of Omnipaque 300 milligram/mL. FLUOROSCOPY: Radiation Exposure Index (as provided by the fluoroscopic device): 1,090 mGy Kerma. COMPLICATIONS: None immediate. TECHNIQUE: Informed written consent was obtained from the patient after a thorough discussion of the procedural risks, benefits and alternatives. All questions were addressed. Maximal Sterile Barrier Technique was utilized including caps, mask, sterile gowns, sterile gloves, sterile drape, hand hygiene and skin antiseptic. A timeout was performed prior to the initiation of the procedure. Using the modified Seldinger technique and a micropuncture kit, access was gained to the distal right radial artery at the anatomical snuffbox and a 6 French sheath was placed. Real-time ultrasound guidance was utilized for vascular access including the acquisition of a permanent ultrasound image documenting patency of the accessed vessel. Slow intra arterial infusion of 300 mcg nitroglycerin diluted in patient's own blood was performed. Then, a right radial artery angiogram was obtained via sheath side port. Normal brachial artery branching pattern seen. No significant anatomical variation. The right radial artery caliber is adequate for vascular access. Next, a 5 [place] Simmons 2 glide catheter was navigated over a 0.035" Terumo Glidewire into the right subclavian artery under fluoroscopic guidance. The catheter was then advanced into the aortic arch where the catheter tip was reformed. Then, the catheter was placed  into the left common carotid artery. Frontal and lateral angiograms of the neck were.  Using biplane roadmap guidance, the catheter was placed into the left internal carotid artery. Frontal and lateral angiograms of the head and face were obtained. The catheter was then placed into the left external carotid artery. Frontal and lateral angiograms of the face/head were obtained. The catheter was then exchanged over the wire and under fluoroscopic guidance for a 6 French benchmark catheter which was placed into the left external carotid artery. Frontal and lateral angiograms of the head/face were obtained. Endovascular embolization of the left sphenopalatine artery was then carried out, as described below. The benchmark was exchanged over the wire and under fluoroscopy for a 5 French Simmons 2 glide catheter which was navigated over a 0.035" Terumo Glidewire into the right subclavian artery. The catheter tip was reformed in the aortic valve. The catheter was then navigated into the right vertebral artery. Frontal and lateral angiograms of the head and skull base were obtained. The catheter was then placed into the right common carotid artery. Frontal and lateral angiograms of the head were obtained followed by frontal and angiograms of the neck. The catheter was then exchanged over the wire and under fluoroscopic guidance for a 6 French benchmark catheter which was placed into the right external carotid artery. Frontal and lateral angiograms of the head/face were obtained. Endovascular embolization of the right sphenopalatine artery was then carried out, as described below. FINDINGS: Right radial artery ultrasound and right radial artery angiogram: The caliber of the distal right radial artery is appropriate for angiogram access. The right radial artery and the right ulnar artery have normal course and caliber. No significant anatomical variants noted. Left CCA angiograms: Cervical angiograms show normal course and caliber of the visualized left common carotid and internal carotid arteries. There are no  significant stenoses. Left ICA angiograms: There is brisk vascular contrast filling of the left ACA and MCA vascular trees. Luminal caliber is smooth and tapering. No aneurysms or abnormally high-flow, early draining veins are seen. No regions of abnormal hypervascularity are noted. The visualized dural sinuses are patent. Small contribution of the ethmoidal branches of the ophthalmic artery to the nasal cavity is seen. Left ECA angiograms: Prominent arterial blush of the nasal septum, inferior and lateral walls of the left nasal cavity are seen from sphenopalatine artery. Minor contribution from the left facial artery. Right vertebral artery angiograms: The right vertebral artery, basilar artery and visualized portions of the bilateral PCA are normal in course and caliber. No aneurysms or abnormally high-flow, early draining veins are seen. No regions of abnormal hypervascularity are noted. The visualized dural sinuses are patent. Right CCA angiograms: Cervical angiograms show normal course and caliber of the visualized right common carotid and internal carotid arteries. There are no significant stenoses. Right CCA angiograms-cranial views: There is brisk vascular contrast filling of the right ACA and MCA vascular trees. Luminal caliber is smooth and tapering. No aneurysms or abnormally high-flow, early draining veins are seen. No regions of abnormal hypervascularity are noted. The visualized dural sinuses are patent. Contribution of the ethmoidal branches of the ophthalmic artery to the nasal cavity is seen. Right ECA angiograms: Prominent arterial blush of the nasal septum, inferior and lateral walls of the right nasal cavity and inferior aspect of the left nasal cavity are seen from sphenopalatine artery. Minor contribution from the right facial artery. PROCEDURE: Left sphenopalatine artery embolization: Left external carotid artery angiogram were obtained with magnified frontal and lateral  views of the face  were obtained. Using biplane roadmap guidance, a Prowler select Plus microcatheter was navigated over a synchro select microguidewire into the left sphenopalatine artery. Frontal and lateral angiograms were obtained with microcatheter contrast injection. Endovascular embolization was then performed with 250-350 polyvinyl alcohol particles (PVA) in an iodinated contrast suspension. No evidence of nontarget embolization. The microcatheter was subsequently withdrawn. Left external carotid artery angiograms showed adequate embolization of the sphenopalatine artery with persistent supply of the nasal cavity by a greater palatine artery. Using biplane roadmap, attempted navigation of a new prowler select plus microcatheter over a synchro select microguidewire into the right greater palatine artery resulted in herniation of the guide catheter into the aortic arch. The guide catheter was then exchanged over the wire and under fluoroscopy for a 5 French Simmons 2 glide catheter which was navigated over a 0.035" Terumo Glidewire into the right subclavian artery. The catheter tip was reformed in the aortic arch. The catheter was then navigated into the left common carotid artery. The glide catheter was then exchanged over the wire and under fluoroscopy for a benchmark catheter which was placed into the left external carotid artery. Frontal and lateral angiograms of the face were obtained. Using biplane roadmap guidance, a headway duo microcatheter was navigated over a synchro select microguidewire into the left greater palatine artery. Frontal and lateral angiograms were obtained with microcatheter contrast injection. Endovascular embolization was then performed with 250-350 polyvinyl alcohol particles (PVA) in an iodinated contrast suspension. No evidence of nontarget embolization. The microcatheter was subsequently withdrawn. Left external carotid artery angiograms showed adequate embolization of the sphenopalatine artery  and greater palatine arteries. Left internal carotid artery angiogram showed no evidence of nontarget embolization to the intracranial circulation. Right sphenopalatine artery embolization: Right external carotid artery angiogram were obtained with magnified frontal and lateral views of the face were obtained. Using biplane roadmap guidance, a Prowler select Plus microcatheter was navigated over a synchro select microguidewire into the right sphenopalatine artery. Frontal and lateral angiograms were obtained with microcatheter contrast injection. Endovascular embolization was then performed with 250-350 polyvinyl alcohol particles (PVA) in an iodinated contrast suspension. No evidence of nontarget embolization. The microcatheter was subsequently withdrawn. Right external carotid artery angiograms showed adequate embolization of the sphenopalatine artery. Right common carotid artery angiogram showed no evidence of nontarget embolization to the intracranial circulation. The guide catheter was subsequently withdrawn. The guide catheter was subsequently withdrawn. An inflatable band was placed and inflated over the right hand access site. The vascular sheath was withdrawn and the band was slowly deflated until brisk flow was noted through the arteriotomy site. At this point, the band was reinflated with additional 2 cc of air to obtain patent hemostasis. IMPRESSION: Successful and uncomplicated bilateral interbody and artery embolization for treatment of persistent recurrent epistaxis. PLAN: 1. Post angiogram care with radial band deflation protocol. 2. Further management per ENT. Electronically Signed   By: Baldemar Lenis M.D.   On: 11/16/2021 15:58   DG Chest Port 1 View  Result Date: 11/15/2021 CLINICAL DATA:  Chest pain. EXAM: PORTABLE CHEST 1 VIEW COMPARISON:  November 13, 2021. FINDINGS: The heart size and mediastinal contours are within normal limits. Both lungs are clear. The visualized skeletal  structures are unremarkable. IMPRESSION: No active disease. Electronically Signed   By: Lupita Raider M.D.   On: 11/15/2021 19:55   IR ANGIO EXTRACRAN SEL COM CAROTID INNOMINATE UNI R MOD SED  Result Date: 11/16/2021 INDICATION: 46 year old male with  intermittent epistaxis for approximately 4 days, persistent despite nasal packing. EXAM: ULTRASOUND-GUIDED VASCULAR ACCESS DIAGNOSTIC CEREBRAL ANGIOGRAM ENDOVASCULAR EPISTAXIS EMBOLIZATION COMPARISON:  None. MEDICATIONS: Clindamycin 900 mg IV. The antibiotic was administered within 1 hour of the procedure. ANESTHESIA/SEDATION: The procedure was performed in the general anesthesia. CONTRAST:  107 mL of Omnipaque 300 milligram/mL. FLUOROSCOPY: Radiation Exposure Index (as provided by the fluoroscopic device): 1,090 mGy Kerma. COMPLICATIONS: None immediate. TECHNIQUE: Informed written consent was obtained from the patient after a thorough discussion of the procedural risks, benefits and alternatives. All questions were addressed. Maximal Sterile Barrier Technique was utilized including caps, mask, sterile gowns, sterile gloves, sterile drape, hand hygiene and skin antiseptic. A timeout was performed prior to the initiation of the procedure. Using the modified Seldinger technique and a micropuncture kit, access was gained to the distal right radial artery at the anatomical snuffbox and a 6 French sheath was placed. Real-time ultrasound guidance was utilized for vascular access including the acquisition of a permanent ultrasound image documenting patency of the accessed vessel. Slow intra arterial infusion of 300 mcg nitroglycerin diluted in patient's own blood was performed. Then, a right radial artery angiogram was obtained via sheath side port. Normal brachial artery branching pattern seen. No significant anatomical variation. The right radial artery caliber is adequate for vascular access. Next, a 5 [place] Simmons 2 glide catheter was navigated over a 0.035" Terumo  Glidewire into the right subclavian artery under fluoroscopic guidance. The catheter was then advanced into the aortic arch where the catheter tip was reformed. Then, the catheter was placed into the left common carotid artery. Frontal and lateral angiograms of the neck were. Using biplane roadmap guidance, the catheter was placed into the left internal carotid artery. Frontal and lateral angiograms of the head and face were obtained. The catheter was then placed into the left external carotid artery. Frontal and lateral angiograms of the face/head were obtained. The catheter was then exchanged over the wire and under fluoroscopic guidance for a 6 French benchmark catheter which was placed into the left external carotid artery. Frontal and lateral angiograms of the head/face were obtained. Endovascular embolization of the left sphenopalatine artery was then carried out, as described below. The benchmark was exchanged over the wire and under fluoroscopy for a 5 French Simmons 2 glide catheter which was navigated over a 0.035" Terumo Glidewire into the right subclavian artery. The catheter tip was reformed in the aortic valve. The catheter was then navigated into the right vertebral artery. Frontal and lateral angiograms of the head and skull base were obtained. The catheter was then placed into the right common carotid artery. Frontal and lateral angiograms of the head were obtained followed by frontal and angiograms of the neck. The catheter was then exchanged over the wire and under fluoroscopic guidance for a 6 French benchmark catheter which was placed into the right external carotid artery. Frontal and lateral angiograms of the head/face were obtained. Endovascular embolization of the right sphenopalatine artery was then carried out, as described below. FINDINGS: Right radial artery ultrasound and right radial artery angiogram: The caliber of the distal right radial artery is appropriate for angiogram access.  The right radial artery and the right ulnar artery have normal course and caliber. No significant anatomical variants noted. Left CCA angiograms: Cervical angiograms show normal course and caliber of the visualized left common carotid and internal carotid arteries. There are no significant stenoses. Left ICA angiograms: There is brisk vascular contrast filling of the left ACA and MCA  vascular trees. Luminal caliber is smooth and tapering. No aneurysms or abnormally high-flow, early draining veins are seen. No regions of abnormal hypervascularity are noted. The visualized dural sinuses are patent. Small contribution of the ethmoidal branches of the ophthalmic artery to the nasal cavity is seen. Left ECA angiograms: Prominent arterial blush of the nasal septum, inferior and lateral walls of the left nasal cavity are seen from sphenopalatine artery. Minor contribution from the left facial artery. Right vertebral artery angiograms: The right vertebral artery, basilar artery and visualized portions of the bilateral PCA are normal in course and caliber. No aneurysms or abnormally high-flow, early draining veins are seen. No regions of abnormal hypervascularity are noted. The visualized dural sinuses are patent. Right CCA angiograms: Cervical angiograms show normal course and caliber of the visualized right common carotid and internal carotid arteries. There are no significant stenoses. Right CCA angiograms-cranial views: There is brisk vascular contrast filling of the right ACA and MCA vascular trees. Luminal caliber is smooth and tapering. No aneurysms or abnormally high-flow, early draining veins are seen. No regions of abnormal hypervascularity are noted. The visualized dural sinuses are patent. Contribution of the ethmoidal branches of the ophthalmic artery to the nasal cavity is seen. Right ECA angiograms: Prominent arterial blush of the nasal septum, inferior and lateral walls of the right nasal cavity and inferior  aspect of the left nasal cavity are seen from sphenopalatine artery. Minor contribution from the right facial artery. PROCEDURE: Left sphenopalatine artery embolization: Left external carotid artery angiogram were obtained with magnified frontal and lateral views of the face were obtained. Using biplane roadmap guidance, a Prowler select Plus microcatheter was navigated over a synchro select microguidewire into the left sphenopalatine artery. Frontal and lateral angiograms were obtained with microcatheter contrast injection. Endovascular embolization was then performed with 250-350 polyvinyl alcohol particles (PVA) in an iodinated contrast suspension. No evidence of nontarget embolization. The microcatheter was subsequently withdrawn. Left external carotid artery angiograms showed adequate embolization of the sphenopalatine artery with persistent supply of the nasal cavity by a greater palatine artery. Using biplane roadmap, attempted navigation of a new prowler select plus microcatheter over a synchro select microguidewire into the right greater palatine artery resulted in herniation of the guide catheter into the aortic arch. The guide catheter was then exchanged over the wire and under fluoroscopy for a 5 French Simmons 2 glide catheter which was navigated over a 0.035" Terumo Glidewire into the right subclavian artery. The catheter tip was reformed in the aortic arch. The catheter was then navigated into the left common carotid artery. The glide catheter was then exchanged over the wire and under fluoroscopy for a benchmark catheter which was placed into the left external carotid artery. Frontal and lateral angiograms of the face were obtained. Using biplane roadmap guidance, a headway duo microcatheter was navigated over a synchro select microguidewire into the left greater palatine artery. Frontal and lateral angiograms were obtained with microcatheter contrast injection. Endovascular embolization was then  performed with 250-350 polyvinyl alcohol particles (PVA) in an iodinated contrast suspension. No evidence of nontarget embolization. The microcatheter was subsequently withdrawn. Left external carotid artery angiograms showed adequate embolization of the sphenopalatine artery and greater palatine arteries. Left internal carotid artery angiogram showed no evidence of nontarget embolization to the intracranial circulation. Right sphenopalatine artery embolization: Right external carotid artery angiogram were obtained with magnified frontal and lateral views of the face were obtained. Using biplane roadmap guidance, a Prowler select Plus microcatheter was navigated over a  synchro select microguidewire into the right sphenopalatine artery. Frontal and lateral angiograms were obtained with microcatheter contrast injection. Endovascular embolization was then performed with 250-350 polyvinyl alcohol particles (PVA) in an iodinated contrast suspension. No evidence of nontarget embolization. The microcatheter was subsequently withdrawn. Right external carotid artery angiograms showed adequate embolization of the sphenopalatine artery. Right common carotid artery angiogram showed no evidence of nontarget embolization to the intracranial circulation. The guide catheter was subsequently withdrawn. The guide catheter was subsequently withdrawn. An inflatable band was placed and inflated over the right hand access site. The vascular sheath was withdrawn and the band was slowly deflated until brisk flow was noted through the arteriotomy site. At this point, the band was reinflated with additional 2 cc of air to obtain patent hemostasis. IMPRESSION: Successful and uncomplicated bilateral interbody and artery embolization for treatment of persistent recurrent epistaxis. PLAN: 1. Post angiogram care with radial band deflation protocol. 2. Further management per ENT. Electronically Signed   By: Baldemar Lenis M.D.   On:  11/16/2021 15:58   IR ANGIO INTRA EXTRACRAN SEL INTERNAL CAROTID UNI L MOD SED  Result Date: 11/16/2021 INDICATION: 46 year old male with intermittent epistaxis for approximately 4 days, persistent despite nasal packing. EXAM: ULTRASOUND-GUIDED VASCULAR ACCESS DIAGNOSTIC CEREBRAL ANGIOGRAM ENDOVASCULAR EPISTAXIS EMBOLIZATION COMPARISON:  None. MEDICATIONS: Clindamycin 900 mg IV. The antibiotic was administered within 1 hour of the procedure. ANESTHESIA/SEDATION: The procedure was performed in the general anesthesia. CONTRAST:  107 mL of Omnipaque 300 milligram/mL. FLUOROSCOPY: Radiation Exposure Index (as provided by the fluoroscopic device): 1,090 mGy Kerma. COMPLICATIONS: None immediate. TECHNIQUE: Informed written consent was obtained from the patient after a thorough discussion of the procedural risks, benefits and alternatives. All questions were addressed. Maximal Sterile Barrier Technique was utilized including caps, mask, sterile gowns, sterile gloves, sterile drape, hand hygiene and skin antiseptic. A timeout was performed prior to the initiation of the procedure. Using the modified Seldinger technique and a micropuncture kit, access was gained to the distal right radial artery at the anatomical snuffbox and a 6 French sheath was placed. Real-time ultrasound guidance was utilized for vascular access including the acquisition of a permanent ultrasound image documenting patency of the accessed vessel. Slow intra arterial infusion of 300 mcg nitroglycerin diluted in patient's own blood was performed. Then, a right radial artery angiogram was obtained via sheath side port. Normal brachial artery branching pattern seen. No significant anatomical variation. The right radial artery caliber is adequate for vascular access. Next, a 5 [place] Simmons 2 glide catheter was navigated over a 0.035" Terumo Glidewire into the right subclavian artery under fluoroscopic guidance. The catheter was then advanced into the  aortic arch where the catheter tip was reformed. Then, the catheter was placed into the left common carotid artery. Frontal and lateral angiograms of the neck were. Using biplane roadmap guidance, the catheter was placed into the left internal carotid artery. Frontal and lateral angiograms of the head and face were obtained. The catheter was then placed into the left external carotid artery. Frontal and lateral angiograms of the face/head were obtained. The catheter was then exchanged over the wire and under fluoroscopic guidance for a 6 French benchmark catheter which was placed into the left external carotid artery. Frontal and lateral angiograms of the head/face were obtained. Endovascular embolization of the left sphenopalatine artery was then carried out, as described below. The benchmark was exchanged over the wire and under fluoroscopy for a 5 French Simmons 2 glide catheter which was navigated over  a 0.035" Terumo Glidewire into the right subclavian artery. The catheter tip was reformed in the aortic valve. The catheter was then navigated into the right vertebral artery. Frontal and lateral angiograms of the head and skull base were obtained. The catheter was then placed into the right common carotid artery. Frontal and lateral angiograms of the head were obtained followed by frontal and angiograms of the neck. The catheter was then exchanged over the wire and under fluoroscopic guidance for a 6 French benchmark catheter which was placed into the right external carotid artery. Frontal and lateral angiograms of the head/face were obtained. Endovascular embolization of the right sphenopalatine artery was then carried out, as described below. FINDINGS: Right radial artery ultrasound and right radial artery angiogram: The caliber of the distal right radial artery is appropriate for angiogram access. The right radial artery and the right ulnar artery have normal course and caliber. No significant anatomical  variants noted. Left CCA angiograms: Cervical angiograms show normal course and caliber of the visualized left common carotid and internal carotid arteries. There are no significant stenoses. Left ICA angiograms: There is brisk vascular contrast filling of the left ACA and MCA vascular trees. Luminal caliber is smooth and tapering. No aneurysms or abnormally high-flow, early draining veins are seen. No regions of abnormal hypervascularity are noted. The visualized dural sinuses are patent. Small contribution of the ethmoidal branches of the ophthalmic artery to the nasal cavity is seen. Left ECA angiograms: Prominent arterial blush of the nasal septum, inferior and lateral walls of the left nasal cavity are seen from sphenopalatine artery. Minor contribution from the left facial artery. Right vertebral artery angiograms: The right vertebral artery, basilar artery and visualized portions of the bilateral PCA are normal in course and caliber. No aneurysms or abnormally high-flow, early draining veins are seen. No regions of abnormal hypervascularity are noted. The visualized dural sinuses are patent. Right CCA angiograms: Cervical angiograms show normal course and caliber of the visualized right common carotid and internal carotid arteries. There are no significant stenoses. Right CCA angiograms-cranial views: There is brisk vascular contrast filling of the right ACA and MCA vascular trees. Luminal caliber is smooth and tapering. No aneurysms or abnormally high-flow, early draining veins are seen. No regions of abnormal hypervascularity are noted. The visualized dural sinuses are patent. Contribution of the ethmoidal branches of the ophthalmic artery to the nasal cavity is seen. Right ECA angiograms: Prominent arterial blush of the nasal septum, inferior and lateral walls of the right nasal cavity and inferior aspect of the left nasal cavity are seen from sphenopalatine artery. Minor contribution from the right facial  artery. PROCEDURE: Left sphenopalatine artery embolization: Left external carotid artery angiogram were obtained with magnified frontal and lateral views of the face were obtained. Using biplane roadmap guidance, a Prowler select Plus microcatheter was navigated over a synchro select microguidewire into the left sphenopalatine artery. Frontal and lateral angiograms were obtained with microcatheter contrast injection. Endovascular embolization was then performed with 250-350 polyvinyl alcohol particles (PVA) in an iodinated contrast suspension. No evidence of nontarget embolization. The microcatheter was subsequently withdrawn. Left external carotid artery angiograms showed adequate embolization of the sphenopalatine artery with persistent supply of the nasal cavity by a greater palatine artery. Using biplane roadmap, attempted navigation of a new prowler select plus microcatheter over a synchro select microguidewire into the right greater palatine artery resulted in herniation of the guide catheter into the aortic arch. The guide catheter was then exchanged over the wire and  under fluoroscopy for a 5 French Simmons 2 glide catheter which was navigated over a 0.035" Terumo Glidewire into the right subclavian artery. The catheter tip was reformed in the aortic arch. The catheter was then navigated into the left common carotid artery. The glide catheter was then exchanged over the wire and under fluoroscopy for a benchmark catheter which was placed into the left external carotid artery. Frontal and lateral angiograms of the face were obtained. Using biplane roadmap guidance, a headway duo microcatheter was navigated over a synchro select microguidewire into the left greater palatine artery. Frontal and lateral angiograms were obtained with microcatheter contrast injection. Endovascular embolization was then performed with 250-350 polyvinyl alcohol particles (PVA) in an iodinated contrast suspension. No evidence of  nontarget embolization. The microcatheter was subsequently withdrawn. Left external carotid artery angiograms showed adequate embolization of the sphenopalatine artery and greater palatine arteries. Left internal carotid artery angiogram showed no evidence of nontarget embolization to the intracranial circulation. Right sphenopalatine artery embolization: Right external carotid artery angiogram were obtained with magnified frontal and lateral views of the face were obtained. Using biplane roadmap guidance, a Prowler select Plus microcatheter was navigated over a synchro select microguidewire into the right sphenopalatine artery. Frontal and lateral angiograms were obtained with microcatheter contrast injection. Endovascular embolization was then performed with 250-350 polyvinyl alcohol particles (PVA) in an iodinated contrast suspension. No evidence of nontarget embolization. The microcatheter was subsequently withdrawn. Right external carotid artery angiograms showed adequate embolization of the sphenopalatine artery. Right common carotid artery angiogram showed no evidence of nontarget embolization to the intracranial circulation. The guide catheter was subsequently withdrawn. The guide catheter was subsequently withdrawn. An inflatable band was placed and inflated over the right hand access site. The vascular sheath was withdrawn and the band was slowly deflated until brisk flow was noted through the arteriotomy site. At this point, the band was reinflated with additional 2 cc of air to obtain patent hemostasis. IMPRESSION: Successful and uncomplicated bilateral interbody and artery embolization for treatment of persistent recurrent epistaxis. PLAN: 1. Post angiogram care with radial band deflation protocol. 2. Further management per ENT. Electronically Signed   By: Baldemar Lenis M.D.   On: 11/16/2021 15:58   IR ANGIO VERTEBRAL SEL VERTEBRAL UNI R MOD SED  Result Date: 11/16/2021 INDICATION:  46 year old male with intermittent epistaxis for approximately 4 days, persistent despite nasal packing. EXAM: ULTRASOUND-GUIDED VASCULAR ACCESS DIAGNOSTIC CEREBRAL ANGIOGRAM ENDOVASCULAR EPISTAXIS EMBOLIZATION COMPARISON:  None. MEDICATIONS: Clindamycin 900 mg IV. The antibiotic was administered within 1 hour of the procedure. ANESTHESIA/SEDATION: The procedure was performed in the general anesthesia. CONTRAST:  107 mL of Omnipaque 300 milligram/mL. FLUOROSCOPY: Radiation Exposure Index (as provided by the fluoroscopic device): 1,090 mGy Kerma. COMPLICATIONS: None immediate. TECHNIQUE: Informed written consent was obtained from the patient after a thorough discussion of the procedural risks, benefits and alternatives. All questions were addressed. Maximal Sterile Barrier Technique was utilized including caps, mask, sterile gowns, sterile gloves, sterile drape, hand hygiene and skin antiseptic. A timeout was performed prior to the initiation of the procedure. Using the modified Seldinger technique and a micropuncture kit, access was gained to the distal right radial artery at the anatomical snuffbox and a 6 French sheath was placed. Real-time ultrasound guidance was utilized for vascular access including the acquisition of a permanent ultrasound image documenting patency of the accessed vessel. Slow intra arterial infusion of 300 mcg nitroglycerin diluted in patient's own blood was performed. Then, a right radial artery angiogram was obtained  via sheath side port. Normal brachial artery branching pattern seen. No significant anatomical variation. The right radial artery caliber is adequate for vascular access. Next, a 5 [place] Simmons 2 glide catheter was navigated over a 0.035" Terumo Glidewire into the right subclavian artery under fluoroscopic guidance. The catheter was then advanced into the aortic arch where the catheter tip was reformed. Then, the catheter was placed into the left common carotid artery.  Frontal and lateral angiograms of the neck were. Using biplane roadmap guidance, the catheter was placed into the left internal carotid artery. Frontal and lateral angiograms of the head and face were obtained. The catheter was then placed into the left external carotid artery. Frontal and lateral angiograms of the face/head were obtained. The catheter was then exchanged over the wire and under fluoroscopic guidance for a 6 French benchmark catheter which was placed into the left external carotid artery. Frontal and lateral angiograms of the head/face were obtained. Endovascular embolization of the left sphenopalatine artery was then carried out, as described below. The benchmark was exchanged over the wire and under fluoroscopy for a 5 [place] Simmons 2 glide catheter which was navigated over a 0.035" Terumo Glidewire into the right subclavian artery. The catheter tip was reformed in the aortic valve. The catheter was then navigated into the right vertebral artery. Frontal and lateral angiograms of the head and skull base were obtained. The catheter was then placed into the right common carotid artery. Frontal and lateral angiograms of the head were obtained followed by frontal and angiograms of the neck. The catheter was then exchanged over the wire and under fluoroscopic guidance for a 6 French benchmark catheter which was placed into the right external carotid artery. Frontal and lateral angiograms of the head/face were obtained. Endovascular embolization of the right sphenopalatine artery was then carried out, as described below. FINDINGS: Right radial artery ultrasound and right radial artery angiogram: The caliber of the distal right radial artery is appropriate for angiogram access. The right radial artery and the right ulnar artery have normal course and caliber. No significant anatomical variants noted. Left CCA angiograms: Cervical angiograms show normal course and caliber of the visualized left common  carotid and internal carotid arteries. There are no significant stenoses. Left ICA angiograms: There is brisk vascular contrast filling of the left ACA and MCA vascular trees. Luminal caliber is smooth and tapering. No aneurysms or abnormally high-flow, early draining veins are seen. No regions of abnormal hypervascularity are noted. The visualized dural sinuses are patent. Small contribution of the ethmoidal branches of the ophthalmic artery to the nasal cavity is seen. Left ECA angiograms: Prominent arterial blush of the nasal septum, inferior and lateral walls of the left nasal cavity are seen from sphenopalatine artery. Minor contribution from the left facial artery. Right vertebral artery angiograms: The right vertebral artery, basilar artery and visualized portions of the bilateral PCA are normal in course and caliber. No aneurysms or abnormally high-flow, early draining veins are seen. No regions of abnormal hypervascularity are noted. The visualized dural sinuses are patent. Right CCA angiograms: Cervical angiograms show normal course and caliber of the visualized right common carotid and internal carotid arteries. There are no significant stenoses. Right CCA angiograms-cranial views: There is brisk vascular contrast filling of the right ACA and MCA vascular trees. Luminal caliber is smooth and tapering. No aneurysms or abnormally high-flow, early draining veins are seen. No regions of abnormal hypervascularity are noted. The visualized dural sinuses are patent. Contribution of the ethmoidal  branches of the ophthalmic artery to the nasal cavity is seen. Right ECA angiograms: Prominent arterial blush of the nasal septum, inferior and lateral walls of the right nasal cavity and inferior aspect of the left nasal cavity are seen from sphenopalatine artery. Minor contribution from the right facial artery. PROCEDURE: Left sphenopalatine artery embolization: Left external carotid artery angiogram were obtained with  magnified frontal and lateral views of the face were obtained. Using biplane roadmap guidance, a Prowler select Plus microcatheter was navigated over a synchro select microguidewire into the left sphenopalatine artery. Frontal and lateral angiograms were obtained with microcatheter contrast injection. Endovascular embolization was then performed with 250-350 polyvinyl alcohol particles (PVA) in an iodinated contrast suspension. No evidence of nontarget embolization. The microcatheter was subsequently withdrawn. Left external carotid artery angiograms showed adequate embolization of the sphenopalatine artery with persistent supply of the nasal cavity by a greater palatine artery. Using biplane roadmap, attempted navigation of a new prowler select plus microcatheter over a synchro select microguidewire into the right greater palatine artery resulted in herniation of the guide catheter into the aortic arch. The guide catheter was then exchanged over the wire and under fluoroscopy for a 5 French Simmons 2 glide catheter which was navigated over a 0.035" Terumo Glidewire into the right subclavian artery. The catheter tip was reformed in the aortic arch. The catheter was then navigated into the left common carotid artery. The glide catheter was then exchanged over the wire and under fluoroscopy for a benchmark catheter which was placed into the left external carotid artery. Frontal and lateral angiograms of the face were obtained. Using biplane roadmap guidance, a headway duo microcatheter was navigated over a synchro select microguidewire into the left greater palatine artery. Frontal and lateral angiograms were obtained with microcatheter contrast injection. Endovascular embolization was then performed with 250-350 polyvinyl alcohol particles (PVA) in an iodinated contrast suspension. No evidence of nontarget embolization. The microcatheter was subsequently withdrawn. Left external carotid artery angiograms showed  adequate embolization of the sphenopalatine artery and greater palatine arteries. Left internal carotid artery angiogram showed no evidence of nontarget embolization to the intracranial circulation. Right sphenopalatine artery embolization: Right external carotid artery angiogram were obtained with magnified frontal and lateral views of the face were obtained. Using biplane roadmap guidance, a Prowler select Plus microcatheter was navigated over a synchro select microguidewire into the right sphenopalatine artery. Frontal and lateral angiograms were obtained with microcatheter contrast injection. Endovascular embolization was then performed with 250-350 polyvinyl alcohol particles (PVA) in an iodinated contrast suspension. No evidence of nontarget embolization. The microcatheter was subsequently withdrawn. Right external carotid artery angiograms showed adequate embolization of the sphenopalatine artery. Right common carotid artery angiogram showed no evidence of nontarget embolization to the intracranial circulation. The guide catheter was subsequently withdrawn. The guide catheter was subsequently withdrawn. An inflatable band was placed and inflated over the right hand access site. The vascular sheath was withdrawn and the band was slowly deflated until brisk flow was noted through the arteriotomy site. At this point, the band was reinflated with additional 2 cc of air to obtain patent hemostasis. IMPRESSION: Successful and uncomplicated bilateral interbody and artery embolization for treatment of persistent recurrent epistaxis. PLAN: 1. Post angiogram care with radial band deflation protocol. 2. Further management per ENT. Electronically Signed   By: Baldemar Lenis M.D.   On: 11/16/2021 15:58   IR ANGIO EXTERNAL CAROTID SEL EXT CAROTID BILAT MOD SED  Result Date: 11/16/2021 INDICATION: 46 year old male with intermittent  epistaxis for approximately 4 days, persistent despite nasal packing.  EXAM: ULTRASOUND-GUIDED VASCULAR ACCESS DIAGNOSTIC CEREBRAL ANGIOGRAM ENDOVASCULAR EPISTAXIS EMBOLIZATION COMPARISON:  None. MEDICATIONS: Clindamycin 900 mg IV. The antibiotic was administered within 1 hour of the procedure. ANESTHESIA/SEDATION: The procedure was performed in the general anesthesia. CONTRAST:  107 mL of Omnipaque 300 milligram/mL. FLUOROSCOPY: Radiation Exposure Index (as provided by the fluoroscopic device): 1,090 mGy Kerma. COMPLICATIONS: None immediate. TECHNIQUE: Informed written consent was obtained from the patient after a thorough discussion of the procedural risks, benefits and alternatives. All questions were addressed. Maximal Sterile Barrier Technique was utilized including caps, mask, sterile gowns, sterile gloves, sterile drape, hand hygiene and skin antiseptic. A timeout was performed prior to the initiation of the procedure. Using the modified Seldinger technique and a micropuncture kit, access was gained to the distal right radial artery at the anatomical snuffbox and a 6 French sheath was placed. Real-time ultrasound guidance was utilized for vascular access including the acquisition of a permanent ultrasound image documenting patency of the accessed vessel. Slow intra arterial infusion of 300 mcg nitroglycerin diluted in patient's own blood was performed. Then, a right radial artery angiogram was obtained via sheath side port. Normal brachial artery branching pattern seen. No significant anatomical variation. The right radial artery caliber is adequate for vascular access. Next, a 5 [place] Simmons 2 glide catheter was navigated over a 0.035" Terumo Glidewire into the right subclavian artery under fluoroscopic guidance. The catheter was then advanced into the aortic arch where the catheter tip was reformed. Then, the catheter was placed into the left common carotid artery. Frontal and lateral angiograms of the neck were. Using biplane roadmap guidance, the catheter was placed into  the left internal carotid artery. Frontal and lateral angiograms of the head and face were obtained. The catheter was then placed into the left external carotid artery. Frontal and lateral angiograms of the face/head were obtained. The catheter was then exchanged over the wire and under fluoroscopic guidance for a 6 French benchmark catheter which was placed into the left external carotid artery. Frontal and lateral angiograms of the head/face were obtained. Endovascular embolization of the left sphenopalatine artery was then carried out, as described below. The benchmark was exchanged over the wire and under fluoroscopy for a 5 French Simmons 2 glide catheter which was navigated over a 0.035" Terumo Glidewire into the right subclavian artery. The catheter tip was reformed in the aortic valve. The catheter was then navigated into the right vertebral artery. Frontal and lateral angiograms of the head and skull base were obtained. The catheter was then placed into the right common carotid artery. Frontal and lateral angiograms of the head were obtained followed by frontal and angiograms of the neck. The catheter was then exchanged over the wire and under fluoroscopic guidance for a 6 French benchmark catheter which was placed into the right external carotid artery. Frontal and lateral angiograms of the head/face were obtained. Endovascular embolization of the right sphenopalatine artery was then carried out, as described below. FINDINGS: Right radial artery ultrasound and right radial artery angiogram: The caliber of the distal right radial artery is appropriate for angiogram access. The right radial artery and the right ulnar artery have normal course and caliber. No significant anatomical variants noted. Left CCA angiograms: Cervical angiograms show normal course and caliber of the visualized left common carotid and internal carotid arteries. There are no significant stenoses. Left ICA angiograms: There is brisk  vascular contrast filling of the left ACA and MCA  vascular trees. Luminal caliber is smooth and tapering. No aneurysms or abnormally high-flow, early draining veins are seen. No regions of abnormal hypervascularity are noted. The visualized dural sinuses are patent. Small contribution of the ethmoidal branches of the ophthalmic artery to the nasal cavity is seen. Left ECA angiograms: Prominent arterial blush of the nasal septum, inferior and lateral walls of the left nasal cavity are seen from sphenopalatine artery. Minor contribution from the left facial artery. Right vertebral artery angiograms: The right vertebral artery, basilar artery and visualized portions of the bilateral PCA are normal in course and caliber. No aneurysms or abnormally high-flow, early draining veins are seen. No regions of abnormal hypervascularity are noted. The visualized dural sinuses are patent. Right CCA angiograms: Cervical angiograms show normal course and caliber of the visualized right common carotid and internal carotid arteries. There are no significant stenoses. Right CCA angiograms-cranial views: There is brisk vascular contrast filling of the right ACA and MCA vascular trees. Luminal caliber is smooth and tapering. No aneurysms or abnormally high-flow, early draining veins are seen. No regions of abnormal hypervascularity are noted. The visualized dural sinuses are patent. Contribution of the ethmoidal branches of the ophthalmic artery to the nasal cavity is seen. Right ECA angiograms: Prominent arterial blush of the nasal septum, inferior and lateral walls of the right nasal cavity and inferior aspect of the left nasal cavity are seen from sphenopalatine artery. Minor contribution from the right facial artery. PROCEDURE: Left sphenopalatine artery embolization: Left external carotid artery angiogram were obtained with magnified frontal and lateral views of the face were obtained. Using biplane roadmap guidance, a Prowler  select Plus microcatheter was navigated over a synchro select microguidewire into the left sphenopalatine artery. Frontal and lateral angiograms were obtained with microcatheter contrast injection. Endovascular embolization was then performed with 250-350 polyvinyl alcohol particles (PVA) in an iodinated contrast suspension. No evidence of nontarget embolization. The microcatheter was subsequently withdrawn. Left external carotid artery angiograms showed adequate embolization of the sphenopalatine artery with persistent supply of the nasal cavity by a greater palatine artery. Using biplane roadmap, attempted navigation of a new prowler select plus microcatheter over a synchro select microguidewire into the right greater palatine artery resulted in herniation of the guide catheter into the aortic arch. The guide catheter was then exchanged over the wire and under fluoroscopy for a 5 French Simmons 2 glide catheter which was navigated over a 0.035" Terumo Glidewire into the right subclavian artery. The catheter tip was reformed in the aortic arch. The catheter was then navigated into the left common carotid artery. The glide catheter was then exchanged over the wire and under fluoroscopy for a benchmark catheter which was placed into the left external carotid artery. Frontal and lateral angiograms of the face were obtained. Using biplane roadmap guidance, a headway duo microcatheter was navigated over a synchro select microguidewire into the left greater palatine artery. Frontal and lateral angiograms were obtained with microcatheter contrast injection. Endovascular embolization was then performed with 250-350 polyvinyl alcohol particles (PVA) in an iodinated contrast suspension. No evidence of nontarget embolization. The microcatheter was subsequently withdrawn. Left external carotid artery angiograms showed adequate embolization of the sphenopalatine artery and greater palatine arteries. Left internal carotid artery  angiogram showed no evidence of nontarget embolization to the intracranial circulation. Right sphenopalatine artery embolization: Right external carotid artery angiogram were obtained with magnified frontal and lateral views of the face were obtained. Using biplane roadmap guidance, a Prowler select Plus microcatheter was navigated over a  synchro select microguidewire into the right sphenopalatine artery. Frontal and lateral angiograms were obtained with microcatheter contrast injection. Endovascular embolization was then performed with 250-350 polyvinyl alcohol particles (PVA) in an iodinated contrast suspension. No evidence of nontarget embolization. The microcatheter was subsequently withdrawn. Right external carotid artery angiograms showed adequate embolization of the sphenopalatine artery. Right common carotid artery angiogram showed no evidence of nontarget embolization to the intracranial circulation. The guide catheter was subsequently withdrawn. The guide catheter was subsequently withdrawn. An inflatable band was placed and inflated over the right hand access site. The vascular sheath was withdrawn and the band was slowly deflated until brisk flow was noted through the arteriotomy site. At this point, the band was reinflated with additional 2 cc of air to obtain patent hemostasis. IMPRESSION: Successful and uncomplicated bilateral interbody and artery embolization for treatment of persistent recurrent epistaxis. PLAN: 1. Post angiogram care with radial band deflation protocol. 2. Further management per ENT. Electronically Signed   By: Baldemar Lenis M.D.   On: 11/16/2021 15:58    Labs:  CBC: Recent Labs    11/13/21 1158 11/15/21 1933 11/16/21 0016 11/16/21 0542 11/17/21 0658  WBC 4.9 5.2  --  4.6 6.3  HGB 13.7 13.6 12.6* 12.2* 10.1*  HCT 39.4 39.0 35.3* 34.2* 29.2*  PLT 176 175  --  172 142*    COAGS: Recent Labs    06/30/21 1555 11/15/21 1933  INR 1.0 1.0     BMP: Recent Labs    11/13/21 1158 11/15/21 1933 11/16/21 0542 11/17/21 0658  NA 136 135 135 136  K 3.5 3.7 4.1 3.7  CL 104 102 105 106  CO2 27 24 23 24   GLUCOSE 87 99 173* 91  BUN 20 14 13 10   CALCIUM 8.9 9.2 8.7* 8.6*  CREATININE 0.95 0.97 0.85 0.79  GFRNONAA >60 >60 >60 >60    LIVER FUNCTION TESTS: Recent Labs    05/17/21 1434 06/30/21 1555 11/16/21 0542  BILITOT 0.7 0.5 0.5  AST 55* 8 16  ALT 81* 12 18  ALKPHOS 59 54 31*  PROT 7.4 6.7 6.4*  ALBUMIN 4.4 4.4 4.0    Assessment and Plan:  Bilateral sphenopalatine artery embolization performed with polyvinyl alcohol particles.  Pt sitting upright in chair. He is A&O, calm and pleasant. He is in no distress. Wife is at bedside. Pt endorses mild pain to sinuses with some slight oozing, mild SOB on exertion, weakness and dizziness. He denies active bleeding, severe pain, N/V. He states he is eating and drinking normally.   Residual material noted to medial edge of L nare. There is dried blood to edge of L nare. No active bleeding noted.  R radial puncture site is soft with no active bleeding and no appreciable pseudoaneurysm. Dressing is C/D/I   Pt was educated on the importance of keeping room air moist with humidifier. He can use saline spray to keep sinuses moist but to wait for a few days post procedure healing.  Discussed the importance of taking medication to manage HTN as well.   Per admitting service, pt to be discharged today.  Electronically Signed: Shon Hough, NP 11/17/2021, 11:36 AM   I spent a total of 15 Minutes at the the patient's bedside AND on the patient's hospital floor or unit, greater than 50% of which was counseling/coordinating care for bilateral sphenopalatine artery embolization performed with polyvinyl alcohol particles.

## 2021-11-17 NOTE — Progress Notes (Signed)
Pt with d.c orders. Discharge paperwork reviewed with pt and all questions. IVs removed. Prescriptions electronically faxed to pharmacy. Pt escorted out via wheelchair with all belongings to private vehicle.  ?

## 2021-11-17 NOTE — Discharge Summary (Signed)
? ?Physician Discharge Summary  ?Shawn York WER:[id number] DOB: [birth year] DOA: 11/15/2021 ? ?PCP: Janith Lima, MD ? ?Admit date: 11/15/2021 ?Discharge date: 11/17/2021 ? ?Admitted From: Home ?Discharge disposition: Home ? ?Recommendations at discharge:  ?NasoPore dissolving packing was placed in the left nare which does not need to be removed. ?ENT recommends application of saline to both nares every 4 hours and Afrin as needed for bleeding. ?Follow-up with ENT in 1 week. ? 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 ?Patient presented to the ED on 3/20 with complaint of persistent epistaxis from the left nare.   ?Patient first began having issues with left-sided nosebleeds 4 days prior to presentation which he states occurred spontaneously when he was bent over.  Denies any history of trauma or injury. ?On 3/20, he was seen at the ENT clinic.  Comprehensive examination at that time did not show any evidence of active bleeding.  A 10 cm Merocel nasal pack was placed due to recurrent nature of epistaxis.  Later that evening, patient began having breakthrough bleeding this did not improve with application of Afrin and manual pressure and hence came to the ED. ? ?In the ED, Merocel was removed and 2 different Rhino Rocket's were placed without improvement in the bleeding.  Patient was seen by ENT.  Recommended embolization.  Neuro interventional radiology was consulted. ?Because of patient's history of anaphylaxis with contrast medium, patient was started on contrast allergy prep. ?Admitted to hospital service. ?See below for details ? ?Subjective:  ?Seen and examined this morning.  Sitting up in chair.  Not in distress.  No new symptoms.  No further bleeding.  Cleared by ENT and IR for discharge today. ? ?Hospital Course:  ?Left-sided epistaxis  ?-4 days of  epistaxis not responding to outpatient intervention or packing ?-Patient underwent embolization of bilateral sphenopalatine artery by IR on 3/21 ?-Slight drop in hemoglobin but no active bleeding for last 24 hours postprocedure ?-NasoPore dissolving packing was placed in the left nare which does not need to be removed. ?-ENT recommends application of saline to both nares every 4 hours and Afrin as needed for bleeding. ?-Patient will follow-up with ENT as an outpatient in a week ? ?Essential hypertension ?-On benazepril 10 mg daily at home. (Not on Cardura at home as previously stated).  Patient measures blood pressure at home and has noted to be elevated to 160s at times.  Benazepril dose has been increased to 10 mg twice daily.  Continue to monitor blood pressure at home. ?-Continue to follow-up with PCP for further adjustment. ? ?Contrast media allergy  ?-received emergent contrast allergy protocol. ?   ?Depression ?-Continue BuSpar ?  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  ?-Continue Trileptal as before ?-Follows annually at [facility]. ?  ?-  Code Status: Full Code  ? ?Nutritional status:  ?Body mass index is 27.12 kg/m?.  ?  ?  ?Diet:  ?Diet Order   ? ?       ?  Diet - low sodium heart healthy       ?  ?  Diet regular Room service appropriate? Yes; Fluid consistency: Thin  Diet effective now       ?  ? ?  ?  ? ?  ? ? ? ?Wounds:  ?- ?  ? ?Discharge Exam:  ? ?Vitals:  ? 11/17/21 1950  11/17/21 0754 11/17/21 1131 11/17/21 1158  ?BP: 108/60 108/71 (!) 83/60 95/64  ?Pulse: 67 72 92   ?Resp: '12 14 18   '$ ?Temp: 98.4 ?F (36.9 ?C) 97.9 ?F (36.6 ?C) 97.7 ?F (36.5 ?C)   ?TempSrc: Oral Oral Oral   ?SpO2: 97% 100% 100%   ?Weight:      ?Height:      ? ? ?Body mass index is 27.12 kg/m?.  ?General exam: Pleasant, middle-aged Caucasian male.  Not in physical distress ?Skin: No rashes, lesions or ulcers. ?HEENT: Atraumatic, normocephalic, no nasal bleeding ?Lungs: Clear to auscultation  bilaterally ?CVS: Regular rate and rhythm, no murmur ?GI/Abd soft, nontender, nondistended, bowel sound present ?CNS: Alert, awake, oriented x3 ?Psychiatry: Mood appropriate ?Extremities: No pedal edema, no calf tenderness ? ?Follow ups:  ? ? Follow-up Information   ? ? Janith Lima, MD Follow up.   ?Specialty: Internal Medicine ?Contact information: ?[place] [address] ?[phone] ? ? ?  ?  ? ? Skotnicki, Meghan A, DO Follow up.   ?Specialty: Otolaryngology ?Contact information: ?[address] ?[address] ?[place] [address] ?[phone] ? ? ?  ?  ? ?  ?  ? ?  ? ? ?Discharge Instructions:  ? ?Discharge Instructions   ? ? Call MD for:  difficulty breathing, headache or visual disturbances   Complete by: As directed ?  ? Call MD for:  extreme fatigue   Complete by: As directed ?  ? Call MD for:  hives   Complete by: As directed ?  ? Call MD for:  persistant dizziness or light-headedness   Complete by: As directed ?  ? Call MD for:  persistant nausea and vomiting   Complete by: As directed ?  ? Call MD for:  severe uncontrolled pain   Complete by: As directed ?  ? Call MD for:  temperature >100.4   Complete by: As directed ?  ? Diet - low sodium heart healthy   Complete by: As directed ?  ? Discharge instructions   Complete by: As directed ?  ? Recommendations at discharge:  ?? NasoPore dissolving packing was placed in the left nare which does not need to be removed. ?? ENT recommends application of saline to both nares every 4 hours and Afrin as needed for bleeding. ?? Follow-up with ENT in 1 week. ? ?General discharge instructions: ?Follow with Primary MD Janith Lima, MD in 7 days  ?Please request your PCP  to go over your hospital tests, procedures, radiology results at the follow up. Please get your medicines reviewed and adjusted.  Your PCP may decide to repeat certain labs or tests as needed. ?Do not drive, operate heavy machinery, perform activities at heights, swimming or  participation in water activities or provide baby sitting services if your were admitted for syncope or siezures until you have seen by Primary MD or a Neurologist and advised to do so again. ?[place] Controlled Substance Reporting System database was reviewed. Do not drive, operate heavy machinery, perform activities at heights, swim, participate in water activities or provide baby-sitting services while on medications for pain, sleep and mood until your outpatient physician has reevaluated you and advised to do so again.  You are strongly recommended to comply with the dose, frequency and duration of prescribed medications. ?Activity: As tolerated with Full fall precautions use walker/cane & assistance as needed ?Avoid using any recreational substances like cigarette, tobacco, alcohol, or non-prescribed drug. ?If you experience worsening  of your admission symptoms, develop shortness of breath, life threatening emergency, suicidal or homicidal thoughts you must seek medical attention immediately by calling 911 or calling your MD immediately  if symptoms less severe. ?You must read complete instructions/literature along with all the possible adverse reactions/side effects for all the medicines you take and that have been prescribed to you. Take any new medicine only after you have completely understood and accepted all the possible adverse reactions/side effects.  ?Wear Seat belts while driving. ?You were cared for by a hospitalist during your hospital stay. If you have any questions about your discharge medications or the care you received while you were in the hospital after you are discharged, you can call the unit and ask to speak with the hospitalist or the covering physician. Once you are discharged, your primary care physician will handle any further medical issues. Please note that NO REFILLS for any discharge medications will be authorized once you are discharged, as it is imperative that you return  to your primary care physician (or establish a relationship with a primary care physician if you do not have one).  ? Increase activity slowly   Complete by: As directed ?  ? ?  ? ? ?Discharge Medications:  ? ?[place]

## 2021-11-17 NOTE — Progress Notes (Signed)
Mobility Specialist: Progress Note ? ? 11/17/21 1316  ?Mobility  ?Activity Ambulated independently in hallway  ?Level of Assistance Independent  ?Assistive Device None  ?Distance Ambulated (ft) 300 ft  ?Activity Response Tolerated well  ?$Mobility charge 1 Mobility  ? ?Pre-Mobility: 84 HR, 100/70 (80) BP ?Post-Mobility: 89 HR, 114/75 (88) BP ? ?Received pt in bed having no complaints and agreeable to mobility. Asymptomatic throughout ambulation, returned back to bed w/ call bell in reach and all needs met. ? ?Harrell Gave Jakira Mcfadden ?Mobility Specialist ?Mobility Specialist [place]: [phone] ?Mobility Specialist [place]: [phone] ? ?

## 2021-11-21 ENCOUNTER — Other Ambulatory Visit: Payer: Self-pay

## 2021-11-21 ENCOUNTER — Emergency Department (HOSPITAL_COMMUNITY)
Admission: EM | Admit: 2021-11-21 | Discharge: 2021-11-21 | Disposition: A | Discharge status: Home / Self Care | Payer: BC Managed Care – PPO | Attending: Emergency Medicine | Admitting: Emergency Medicine

## 2021-11-21 ENCOUNTER — Encounter (HOSPITAL_COMMUNITY): Payer: Self-pay

## 2021-11-21 DIAGNOSIS — I10 Essential (primary) hypertension: Secondary | ICD-10-CM | POA: Diagnosis not present

## 2021-11-21 DIAGNOSIS — Z79899 Other long term (current) drug therapy: Secondary | ICD-10-CM | POA: Insufficient documentation

## 2021-11-21 DIAGNOSIS — R040 Epistaxis: Secondary | ICD-10-CM | POA: Diagnosis not present

## 2021-11-21 LAB — CBC
HCT: 30.6 % — ABNORMAL LOW (ref 39.0–52.0)
Hemoglobin: 10.6 g/dL — ABNORMAL LOW (ref 13.0–17.0)
MCH: 33.4 pg (ref 26.0–34.0)
MCHC: 34.6 g/dL (ref 30.0–36.0)
MCV: 96.5 fL (ref 80.0–100.0)
Platelets: 199 10*3/uL (ref 150–400)
RBC: 3.17 MIL/uL — ABNORMAL LOW (ref 4.22–5.81)
RDW: 12.2 % (ref 11.5–15.5)
WBC: 4.5 10*3/uL (ref 4.0–10.5)
nRBC: 0 % (ref 0.0–0.2)

## 2021-11-21 LAB — PROTIME-INR
INR: 1 (ref 0.8–1.2)
Prothrombin Time: 13.2 seconds (ref 11.4–15.2)

## 2021-11-21 MED ORDER — OXYMETAZOLINE HCL 0.05 % NA SOLN
1.0000 | Freq: Once | NASAL | Status: DC
  Filled 2021-11-21: qty 30

## 2021-11-21 MED ORDER — OXYCODONE HCL 5 MG PO TABS: 5.0000 mg | ORAL_TABLET | Freq: Four times a day (QID) | ORAL | 0 refills | Status: AC | PRN

## 2021-11-21 MED ORDER — OXYCODONE HCL 5 MG PO TABS
5.0000 mg | ORAL_TABLET | Freq: Once | ORAL | Status: AC
Start: 2021-11-21 — End: 2021-11-21
  Administered 2021-11-21: 5 mg via ORAL
  Filled 2021-11-21: qty 1

## 2021-11-21 NOTE — ED Triage Notes (Signed)
Pt arrives to ED POV c/o epistaxis since 7pm this afternoon. Pt was seen at IR for same and packing was removed today. Pt has a tampon in his Left nare but is still bleeding. ?

## 2021-11-21 NOTE — ED Provider Notes (Signed)
?[facility] ?Provider Note ? ? ?CSN: [id number] ?Arrival date & time: 11/21/21  2003 ? ?  ? ?History ?Chief Complaint  ?Patient presents with  ? Epistaxis  ? ? ?Shawn York is a 46 y.o. 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 ? ?Nosebleed started on 3/20 from left nare then.  Persistent bleeding underwent IR embolization on 3/21.  Dissolvable nasal packing came out today, and he immediately surgery bleed.  Bleeding occurred for about 2 hours.  They then placed a tampon in his left nare and the bleeding stopped.  Patient denies any other symptoms.  Patient never had nosebleeds before this timeframe. ? ?Epistaxis ?Associated symptoms: no headaches   ? ?  ? ?Home Medications ?Prior to Admission medications   ?Medication Sig Start Date End Date Taking? Authorizing Provider  ?oxyCODONE (ROXICODONE) 5 MG immediate release tablet Take 1 tablet (5 mg total) by mouth every 6 (six) hours as needed for up to 3 days for severe pain. 11/21/21 11/24/21 Yes Lupita Dawn, MD  ?acetaminophen (TYLENOL) 500 MG tablet Take 1,000 mg by mouth every 6 (six) hours as needed for moderate pain or headache.    [provider]  ?benazepril (LOTENSIN) 10 MG tablet Take 1 tablet (10 mg total) by mouth 2 (two) times daily. 11/17/21 02/15/22  Terrilee Croak, MD  ?busPIRone (BUSPAR) 30 MG tablet Take 1 tablet (30 mg total) by mouth 2 (two) times daily. 04/29/21 11/15/21  Thayer Headings, [place]  ?EPINEPHrine 0.3 mg/0.3 mL IJ SOAJ injection Inject 0.3 mg into the muscle once as needed (for a severe, allergic reaction).     [provider]  ?Oxcarbazepine (TRILEPTAL) 300 MG tablet Take 1 tablet (300 mg total) by mouth 2 (two) times daily. 08/21/16   Derrill Center, NP  ?vortioxetine HBr (TRINTELLIX) 10 MG TABS tablet Decrease to 10 mg tablet daily for one week, then  stop ?Patient not taking: Reported on 11/15/2021 05/27/21   Thayer Headings, [place]  ?   ? ?Allergies    ?Iodinated contrast media, Penicillins, Shellfish allergy, and Shellfish-derived products   ? ?Review of Systems   ?Review of Systems  ?HENT:  Positive for nosebleeds. Negative for rhinorrhea.   ?Skin:  Negative for rash and wound.  ?Neurological:  Negative for light-headedness and headaches.  ? ?Physical Exam ?Updated Vital Signs ?BP (!) 130/100   Pulse 63   Temp 98 ?F (36.7 ?C) (Oral)   Resp 18   SpO2 100%  ?Physical Exam ?Constitutional:   ?   Appearance: He is not ill-appearing.  ?HENT:  ?   Nose:  ?   Comments: L nare removed a large clot and then minimal active bleeding noted.  ?Pulmonary:  ?   Effort: Pulmonary effort is normal. No respiratory distress.  ?Abdominal:  ?   Tenderness: There is no abdominal tenderness. There is no guarding or rebound.  ?Neurological:  ?   Mental Status: He is alert.  ? ? ?ED Results / Procedures / Treatments   ?Labs ?(all labs ordered are listed, but only abnormal results are displayed) ?Labs Reviewed  ?CBC - Abnormal; Notable for the following components:  ?    Result Value  ? RBC 3.17 (*)   ? Hemoglobin 10.6 (*)   ? HCT 30.6 (*)   ? All other components within normal limits  ?PROTIME-INR  ? ? ?EKG ?None ? ?Radiology ?No  results found. ? ?Procedures ?.Epistaxis Management ? ?Date/Time: 11/21/2021 10:27 PM ?Performed by: Lupita Dawn, MD ?Authorized by: Isla Pence, MD  ? ?Consent:  ?  Consent obtained:  Verbal ?  Consent given by:  Patient ?  Risks, benefits, and alternatives were discussed: yes   ?  Risks discussed:  Bleeding, infection, nasal injury and pain ?  Alternatives discussed:  Alternative treatment and observation ?Universal protocol:  ?  Procedure explained and questions answered to patient or proxy's satisfaction: yes   ?  Site/side marked: yes   ?  Immediately prior to procedure, a time out was called: yes   ?  Patient identity confirmed:  Verbally with  patient ?Anesthesia:  ?  Anesthesia method:  None ?Procedure details:  ?  Treatment site:  L anterior and L posterior ?  Treatment method:  Nasal balloon ?  Treatment complexity:  Limited ?  Treatment episode: recurring   ?Post-procedure details:  ?  Assessment:  Bleeding stopped ?  Procedure completion:  Tolerated well, no immediate complications  ? ?Medications Ordered in ED ?Medications  ?oxymetazoline (AFRIN) 0.05 % nasal spray 1 spray (0 sprays Each Nare Hold 11/21/21 2128)  ?oxyCODONE (Oxy IR/ROXICODONE) immediate release tablet 5 mg (5 mg Oral Given 11/21/21 2159)  ? ? ?ED Course/ Medical Decision Making/ A&P ?  ?                        ?Medical Decision Making ?Amount and/or Complexity of Data Reviewed ?Labs: ordered. ? ?Risk ?OTC drugs. ?Prescription drug management. ? ? ? ?On exam, left ear with a tampon noted.  No bleeding down the back of his throat.   ? ?Hemoglobin 10.6 (10.1 4 days ago, 12.2 5 days ago).  No indication to inflate at this time given bleeding had subsided.   Spoke with ENT on-call, Dr. Barry Dienes who recommends replacing tampon with a packing and if that is successfully done patient can follow-up in clinic with them.  Removed tampon from left nare.  Able to have him blow out a large clot from his left nare.  Minimal ooze noted.  Able to visualize inside the nare but no distinct area of a bleed. Place a 7.5 rapid Rhino in his left nare with no difficulty.  No indication for further imaging at this time due to bleeding controlled.  Monitored for multiple hours.  Had patient walk around the room and no recurrence of the bleed occurred.  Offered admission, however patient has close follow-up with ENT and feels comfortable going home and following up with them.  Patient given strict return precautions.  Wife and patient verbalized agreement understanding of plan.  Patient discharged in stable condition. ? ?Seen in conjunction with my attending Dr. Gilford Raid who agreed with plan. ? ? ?Final Clinical  Impression(s) / ED Diagnoses ?Final diagnoses:  ?Epistaxis  ? ? ?Rx / DC Orders ?ED Discharge Orders   ? ?      Ordered  ?  oxyCODONE (ROXICODONE) 5 MG immediate release tablet  Every 6 hours PRN       ? 11/21/21 2220  ? ?  ?  ? ?  ? ? ?  ?Lupita Dawn, MD ?11/22/21 0041 ? ?  ?Isla Pence, MD ?11/22/21 1639 ? ?

## 2021-11-21 NOTE — Discharge Instructions (Signed)
You were evaluated in the Emergency Department and after careful evaluation, we did not find any emergent condition requiring admission or further testing in the hospital. ? ?Please return to the ED if your nose starts to bleed again.  Keep your nasal packing in until seen by your ENT.  Please call ENT for [facility] tomorrow morning.  Your hemoglobin was stable even though he lost a lot of blood today.  If you start to feel lightheaded, shortness of breath, develop chest pain, fever, chills or your nose pain becomes unbearable return to the ED to be seen.  Please only take your oxycodone as prescribed for severe pain.  Please return any leftovers to the pharmacy if you do not use them. ? ?Please return to the Emergency Department if you experience any worsening of your condition.  Thank you for allowing [place] to be a part of your care.  ?

## 2021-11-22 ENCOUNTER — Encounter: Payer: Self-pay | Admitting: Internal Medicine

## 2021-11-22 ENCOUNTER — Other Ambulatory Visit: Payer: Self-pay | Admitting: Internal Medicine

## 2021-11-22 ENCOUNTER — Ambulatory Visit (INDEPENDENT_AMBULATORY_CARE_PROVIDER_SITE_OTHER): Payer: BC Managed Care – PPO | Admitting: Internal Medicine

## 2021-11-22 VITALS — BP 122/84 | HR 91 | Temp 98.1°F | Ht 72.0 in | Wt 188.0 lb

## 2021-11-22 DIAGNOSIS — I10 Essential (primary) hypertension: Secondary | ICD-10-CM

## 2021-11-22 DIAGNOSIS — D539 Nutritional anemia, unspecified: Secondary | ICD-10-CM | POA: Diagnosis not present

## 2021-11-22 DIAGNOSIS — F331 Major depressive disorder, recurrent, moderate: Secondary | ICD-10-CM | POA: Diagnosis not present

## 2021-11-22 DIAGNOSIS — D538 Other specified nutritional anemias: Secondary | ICD-10-CM | POA: Diagnosis not present

## 2021-11-22 DIAGNOSIS — E519 Thiamine deficiency, unspecified: Secondary | ICD-10-CM

## 2021-11-22 MED ORDER — VORTIOXETINE HBR 5 MG PO TABS: 5.0000 mg | ORAL_TABLET | Freq: Every day | ORAL | 0 refills | Status: DC

## 2021-11-22 MED ORDER — NEBIVOLOL HCL 5 MG PO TABS: 5.0000 mg | ORAL_TABLET | Freq: Every day | ORAL | 0 refills | Status: DC

## 2021-11-22 NOTE — Progress Notes (Signed)
? ?Subjective:  ?Patient ID: Shawn York, male    DOB: Apr 02, 1976  Age: 46 y.o. MRN: [id number] ? ?CC: Anemia ? ? ?HPI ?Johnross Nabozny presents for f/up - ? ?He tells me that up until 10 days ago he was drinking pretty heavily.  He has abstained from alcohol since then.  He thinks the alcohol may be contributing to the wide swings in his blood pressure.  He also continues to struggle with epistaxis and has been to the ED and is seen an ENT doctor.  The nose is currently packed. ? ?Outpatient Medications Prior to Visit  ?Medication Sig Dispense Refill  ? acetaminophen (TYLENOL) 500 MG tablet Take 1,000 mg by mouth every 6 (six) hours as needed for moderate pain or headache.    ? benazepril (LOTENSIN) 10 MG tablet Take 1 tablet (10 mg total) by mouth 2 (two) times daily. 60 tablet 2  ? EPINEPHrine 0.3 mg/0.3 mL IJ SOAJ injection Inject 0.3 mg into the muscle once as needed (for a severe, allergic reaction).     ? Oxcarbazepine (TRILEPTAL) 300 MG tablet Take 1 tablet (300 mg total) by mouth 2 (two) times daily. 60 tablet 0  ? oxyCODONE (ROXICODONE) 5 MG immediate release tablet Take 1 tablet (5 mg total) by mouth every 6 (six) hours as needed for up to 3 days for severe pain. 6 tablet 0  ? vortioxetine HBr (TRINTELLIX) 10 MG TABS tablet Decrease to 10 mg tablet daily for one week, then stop 7 tablet 0  ? busPIRone (BUSPAR) 30 MG tablet Take 1 tablet (30 mg total) by mouth 2 (two) times daily. 180 tablet 1  ? ?No facility-administered medications prior to visit.  ? ? ?ROS ?Review of Systems  ?Constitutional:  Negative for chills, diaphoresis, fatigue and fever.  ?HENT:  Positive for nosebleeds. Negative for sore throat.   ?Eyes: Negative.   ?Respiratory:  Negative for cough, choking, wheezing and stridor.   ?Cardiovascular:  Negative for palpitations and leg swelling.  ?Gastrointestinal:  Negative for abdominal pain, diarrhea, nausea and vomiting.  ?Endocrine: Negative.   ?Genitourinary: Negative.   ?Musculoskeletal:   Positive for arthralgias. Negative for back pain.  ?Skin: Negative.  Negative for color change.  ?Hematological:  Negative for adenopathy. Does not bruise/bleed easily.  ?Psychiatric/Behavioral:  Positive for dysphoric mood and sleep disturbance. Negative for behavioral problems, confusion, decreased concentration and suicidal ideas. The patient is not nervous/anxious.   ? ?Objective:  ?BP 122/84 (BP Location: Left Arm, Patient Position: Sitting, Cuff Size: Large)   Pulse 91   Temp 98.1 ?F (36.7 ?C) (Oral)   Ht 6' (1.829 m)   Wt 188 lb (85.3 kg)   SpO2 98%   BMI 25.50 kg/m?  ? ?BP Readings from Last 3 Encounters:  ?11/22/21 122/84  ?11/21/21 (!) 130/100  ?11/17/21 118/75  ? ? ?Wt Readings from Last 3 Encounters:  ?11/22/21 188 lb (85.3 kg)  ?11/16/21 200 lb (90.7 kg)  ?11/13/21 197 lb (89.4 kg)  ? ? ?Physical Exam ?Vitals reviewed.  ?HENT:  ?   Nose: Nose normal.  ?   Mouth/Throat:  ?   Mouth: Mucous membranes are moist.  ?Eyes:  ?   General: No scleral icterus. ?   Conjunctiva/sclera: Conjunctivae normal.  ?Cardiovascular:  ?   Rate and Rhythm: Normal rate and regular rhythm.  ?   Heart sounds: No murmur heard. ?Pulmonary:  ?   Effort: Pulmonary effort is normal.  ?   Breath sounds: No stridor. No wheezing, rhonchi  or rales.  ?Abdominal:  ?   General: Abdomen is flat.  ?   Palpations: There is no mass.  ?   Tenderness: There is no abdominal tenderness. There is no guarding.  ?   Hernia: No hernia is present.  ?Musculoskeletal:     ?   General: Normal range of motion.  ?   Cervical back: Neck supple.  ?   Right lower leg: No edema.  ?Lymphadenopathy:  ?   Cervical: No cervical adenopathy.  ?Skin: ?   General: Skin is warm and dry.  ?Neurological:  ?   General: No focal deficit present.  ?   Mental Status: He is alert.  ?Psychiatric:     ?   Attention and Perception: Attention and perception normal.     ?   Mood and Affect: Mood is depressed. Mood is not anxious. Affect is flat and tearful.     ?   Speech:  Speech is not delayed or tangential.     ?   Behavior: Behavior normal. Behavior is cooperative.     ?   Thought Content: Thought content normal. Thought content is not paranoid or delusional. Thought content does not include homicidal or suicidal ideation.     ?   Judgment: Judgment normal.  ? ? ?Lab Results  ?Component Value Date  ? WBC 4.5 11/21/2021  ? HGB 10.6 (L) 11/21/2021  ? HCT 30.6 (L) 11/21/2021  ? PLT 199 11/21/2021  ? GLUCOSE 91 11/17/2021  ? CHOL 213 (H) 05/17/2021  ? TRIG 271.0 (H) 05/17/2021  ? HDL 53.60 05/17/2021  ? LDLDIRECT 127.0 05/17/2021  ? ALT 18 11/16/2021  ? AST 16 11/16/2021  ? NA 136 11/17/2021  ? K 3.7 11/17/2021  ? CL 106 11/17/2021  ? CREATININE 0.79 11/17/2021  ? BUN 10 11/17/2021  ? CO2 24 11/17/2021  ? TSH 1.37 05/17/2021  ? INR 1.0 11/21/2021  ? ? ?No results found. ? ?Assessment & Plan:  ? ?Wendy was seen today for anemia. ? ?Diagnoses and all orders for this visit: ? ?Primary hypertension- Labs are negative for secondary causes. ?-     Aldosterone + renin activity w/ ratio; Future ?-     nebivolol (BYSTOLIC) 5 MG tablet; Take 1 tablet (5 mg total) by mouth daily. ?-     Aldosterone + renin activity w/ ratio ? ?Deficiency anemia- His thiamine level is borderline low.  This may be contributing to the anemia. ?-     IBC + Ferritin; Future ?-     Vitamin B12; Future ?-     Folate; Future ?-     Vitamin B1; Future ?-     Vitamin B1 ?-     Folate ?-     Vitamin B12 ?-     IBC + Ferritin ?-     Ambulatory referral to Hematology / Oncology ? ?Moderate episode of recurrent major depressive disorder (HCC) ?-     vortioxetine HBr (TRINTELLIX) 5 MG TABS tablet; Take 1 tablet (5 mg total) by mouth daily. ? ?Anemia due to acquired thiamine deficiency- He is committed to abstaining from alcohol.  I recommended that he attend AA meetings.  Will start a thiamine supplement. ?-     thiamine 100 MG tablet; Take 1 tablet (100 mg total) by mouth daily. ? ? ?I have discontinued Darriel Basques's  vortioxetine HBr. I am also having him start on nebivolol, vortioxetine HBr, and thiamine. Additionally, I am having him maintain his EPINEPHrine, Oxcarbazepine,  busPIRone, acetaminophen, and benazepril. ? ?Meds ordered this encounter  ?Medications  ? nebivolol (BYSTOLIC) 5 MG tablet  ?  Sig: Take 1 tablet (5 mg total) by mouth daily.  ?  Dispense:  90 tablet  ?  Refill:  0  ? vortioxetine HBr (TRINTELLIX) 5 MG TABS tablet  ?  Sig: Take 1 tablet (5 mg total) by mouth daily.  ?  Dispense:  30 tablet  ?  Refill:  0  ? thiamine 100 MG tablet  ?  Sig: Take 1 tablet (100 mg total) by mouth daily.  ?  Dispense:  90 tablet  ?  Refill:  1  ? ? ? ?Follow-up: Return in about 3 months (around 02/22/2022). ? ?Scarlette Calico, MD ?

## 2021-11-22 NOTE — Patient Instructions (Signed)
Anemia ?Anemia is a condition in which there is not enough red blood cells or hemoglobin in the blood. Hemoglobin is a substance in red blood cells that carries oxygen. ?When you do not have enough red blood cells or hemoglobin (are anemic), your body cannot get enough oxygen and your organs may not work properly. As a result, you may feel very tired or have other problems. ?What are the causes? ?Common causes of anemia include: ?Excessive bleeding. Anemia can be caused by excessive bleeding inside or outside the body, including bleeding from the intestines or from heavy menstrual periods in females. ?Poor nutrition. ?Long-lasting (chronic) kidney, thyroid, and liver disease. ?Bone marrow disorders, spleen problems, and blood disorders. ?Cancer and treatments for cancer. ?HIV (human immunodeficiency virus) and AIDS (acquired immunodeficiency syndrome). ?Infections, medicines, and autoimmune disorders that destroy red blood cells. ?What are the signs or symptoms? ?Symptoms of this condition include: ?Minor weakness. ?Dizziness. ?Headache, or difficulties concentrating and sleeping. ?Heartbeats that feel irregular or faster than normal (palpitations). ?Shortness of breath, especially with exercise. ?Pale skin, lips, and nails, or cold hands and feet. ?Indigestion and nausea. ?Symptoms may occur suddenly or develop slowly. If your anemia is mild, you may not have symptoms. ?How is this diagnosed? ?This condition is diagnosed based on blood tests, your medical history, and a physical exam. In some cases, a test may be needed in which cells are removed from the soft tissue inside of a bone and looked at under a microscope (bone marrow biopsy). Your health care provider may also check your stool (feces) for blood and may do additional testing to look for the cause of your bleeding. ?Other tests may include: ?Imaging tests, such as a CT scan or MRI. ?A procedure to see inside your esophagus and stomach (endoscopy). ?A  procedure to see inside your colon and rectum (colonoscopy). ?How is this treated? ?Treatment for this condition depends on the cause. If you continue to lose a lot of blood, you may need to be treated at a hospital. Treatment may include: ?Taking supplements of iron, vitamin E09, or folic acid. ?Taking a hormone medicine (erythropoietin) that can help to stimulate red blood cell growth. ?Having a blood transfusion. This may be needed if you lose a lot of blood. ?Making changes to your diet. ?Having surgery to remove your spleen. ?Follow these instructions at home: ?Take over-the-counter and prescription medicines only as told by your health care provider. ?Take supplements only as told by your health care provider. ?Follow any diet instructions that you were given by your health care provider. ?Keep all follow-up visits as told by your health care provider. This is important. ?Contact a health care provider if: ?You develop new bleeding anywhere in the body. ?Get help right away if: ?You are very weak. ?You are short of breath. ?You have pain in your abdomen or chest. ?You are dizzy or feel faint. ?You have trouble concentrating. ?You have bloody stools, black stools, or tarry stools. ?You vomit repeatedly or you vomit up blood. ?These symptoms may represent a serious problem that is an emergency. Do not wait to see if the symptoms will go away. Get medical help right away. Call your local emergency services (911 in the [place] Do not drive yourself to the hospital. ?Summary ?Anemia is a condition in which you do not have enough red blood cells or enough of a substance in your red blood cells that carries oxygen (hemoglobin). ?Symptoms may occur suddenly or develop slowly. ?If your anemia is  mild, you may not have symptoms. ?This condition is diagnosed with blood tests, a medical history, and a physical exam. Other tests may be needed. ?Treatment for this condition depends on the cause of the anemia. ?This  information is not intended to replace advice given to you by your health care provider. Make sure you discuss any questions you have with your health care provider. ?Document Revised: 07/23/2019 Document Reviewed: 07/23/2019 ?Elsevier Patient Education ? 2022 [employer] ? ?

## 2021-11-23 ENCOUNTER — Other Ambulatory Visit: Payer: BC Managed Care – PPO

## 2021-11-23 ENCOUNTER — Ambulatory Visit: Payer: BC Managed Care – PPO | Admitting: Internal Medicine

## 2021-11-23 LAB — IBC + FERRITIN
Ferritin: 109.8 ng/mL (ref 22.0–322.0)
Iron: 75 ug/dL (ref 42–165)
Saturation Ratios: 29.3 % (ref 20.0–50.0)
TIBC: 256.2 ug/dL (ref 250.0–450.0)
Transferrin: 183 mg/dL — ABNORMAL LOW (ref 212.0–360.0)

## 2021-11-23 LAB — VITAMIN B12: Vitamin B-12: 502 pg/mL (ref 211–911)

## 2021-11-23 LAB — FOLATE: Folate: 15 ng/mL (ref >5.9)

## 2021-11-25 ENCOUNTER — Encounter: Payer: Self-pay | Admitting: Orthopaedic Surgery

## 2021-11-26 ENCOUNTER — Encounter: Payer: Self-pay | Admitting: Internal Medicine

## 2021-11-26 DIAGNOSIS — J342 Deviated nasal septum: Secondary | ICD-10-CM | POA: Diagnosis not present

## 2021-11-26 DIAGNOSIS — R040 Epistaxis: Secondary | ICD-10-CM | POA: Diagnosis not present

## 2021-11-29 ENCOUNTER — Telehealth: Payer: Self-pay | Admitting: Hematology and Oncology

## 2021-11-29 NOTE — Telephone Encounter (Signed)
Scheduled appointment per 04/03 referral. Patient is aware of appointment date and time. Patient is aware to arrive 15 mins prior to appointment time and to bring updated insurance cards. Patient is aware of location.  ? ?

## 2021-11-29 NOTE — Progress Notes (Addendum)
Surgical Instructions ? ? ? Your procedure is scheduled on Monday April 17th. ? Report to [facility] Main Entrance "A" at 1 P.M., then check in with the Admitting office. ? Call this number if you have problems the morning of surgery: ? [phone] ? ? If you have any questions prior to your surgery date call [phone]: Open Monday-Friday 8am-4pm ? ? ? Remember: ? Do not eat after midnight the night before your surgery ? ?You may drink clear liquids until 12 pm the afternoon of your surgery.   ?Clear liquids allowed are: Water, Non-Citrus Juices (without pulp), Carbonated Beverages, Clear Tea, Black Coffee ONLY (NO MILK, CREAM OR POWDERED CREAMER of any kind), and Gatorade ? ? ?Enhanced Recovery after Surgery for Orthopedics ?Enhanced Recovery after Surgery is a protocol used to improve the stress on your body and your recovery after surgery. ? ?Patient Instructions ? ?The day of surgery (if you do NOT have diabetes):  ?Drink ONE (1) Pre-Surgery Clear Ensure by ___12__ pm the morning of surgery   ?This drink was given to you during your hospital  ?pre-op appointment visit. ?Nothing else to drink after completing the  ?Pre-Surgery Clear Ensure. ? ?       If you have questions, please contact your surgeon?s office. ? ?  ? Take these medicines the morning of surgery with A SIP OF WATER: ?nebivolol (BYSTOLIC) 5 MG tablet ?busPIRone (BUSPAR) 30 MG tablet ?Oxcarbazepine (TRILEPTAL) 300 MG tablet ?vortioxetine HBr (TRINTELLIX) 5 MG TABS tablet ? ?IF NEEDED  ?acetaminophen (TYLENOL) 500 MG tablet ?EPINEPHrine 0.3 mg/0.3 mL IJ SOAJ injection ? ? ?As of today, STOP taking any Aspirin (unless otherwise instructed by your surgeon) Aleve, Naproxen, Ibuprofen, Motrin, Advil, Goody's, BC's, all herbal medications, fish oil, and all vitamins. ? ?         ?Do not wear jewelry  ?Do not wear lotions, powders, colognes, or deodorant. ?Do not shave 48 hours prior to surgery.  Men may shave face and neck. ?Do not bring valuables to  the hospital. ?Do not wear nail polish, gel polish, artificial nails, or any other type of covering on natural nails (fingers and toes) ?If you have artificial nails or gel coating that need to be removed by a nail salon, please have this removed prior to surgery. Artificial nails or gel coating may interfere with anesthesia's ability to adequately monitor your vital signs. ? ?[facility] is not responsible for any belongings or valuables. .  ? ?Do NOT Smoke (Tobacco/Vaping)  24 hours prior to your procedure ? ?If you use a CPAP at night, you may bring your mask for your overnight stay. ?  ?Contacts, glasses, hearing aids, dentures or partials may not be worn into surgery, please bring cases for these belongings ?  ?For patients admitted to the hospital, discharge time will be determined by your treatment team. ?  ?Patients discharged the day of surgery will not be allowed to drive home, and someone needs to stay with them for 24 hours. ? ? ?SURGICAL WAITING ROOM VISITATION ?Patients having surgery or a procedure in a hospital may have two support people. ?Children under the age of 9 must have an adult with them who is not the patient. ?They may stay in the waiting area during the procedure and may switch out with other visitors. If the patient needs to stay at the hospital during part of their recovery, the visitor guidelines for inpatient rooms apply. ? ?Please refer to the [facility] website for the visitor  guidelines for Inpatients (after your surgery is over and you are in a regular room).  ? ? ? ? ? ?Special instructions:   ? ?Oral Hygiene is also important to reduce your risk of infection.  Remember - BRUSH YOUR TEETH THE MORNING OF SURGERY WITH YOUR REGULAR TOOTHPASTE ? ? ?[facility]- Preparing For Surgery ? ?Before surgery, you can play an important role. Because skin is not sterile, your skin needs to be as free of germs as possible. You can reduce the number of germs on your skin by washing with CHG  (chlorahexidine gluconate) Soap before surgery.  CHG is an antiseptic cleaner which kills germs and bonds with the skin to continue killing germs even after washing.   ? ? ?Please do not use if you have an allergy to CHG or antibacterial soaps. If your skin becomes reddened/irritated stop using the CHG.  ?Do not shave (including legs and underarms) for at least 48 hours prior to first CHG shower. It is OK to shave your face. ? ?Please follow these instructions carefully. ?  ? ? Shower the NIGHT BEFORE SURGERY and the MORNING OF SURGERY with CHG Soap.  ? If you chose to wash your hair, wash your hair first as usual with your normal shampoo. After you shampoo, rinse your hair and body thoroughly to remove the shampoo.  Then [employer] and genitals (private parts) with your normal soap and rinse thoroughly to remove soap. ? ?After that Use CHG Soap as you would any other liquid soap. You can apply CHG directly to the skin and wash gently with a scrungie or a clean washcloth.  ? ?Apply the CHG Soap to your body ONLY FROM THE NECK DOWN.  Do not use on open wounds or open sores. Avoid contact with your eyes, ears, mouth and genitals (private parts). Wash Face and genitals (private parts)  with your normal soap.  ? ?Wash thoroughly, paying special attention to the area where your surgery will be performed. ? ?Thoroughly rinse your body with warm water from the neck down. ? ?DO NOT shower/wash with your normal soap after using and rinsing off the CHG Soap. ? ?Pat yourself dry with a CLEAN TOWEL. ? ?Wear CLEAN PAJAMAS to bed the night before surgery ? ?Place CLEAN SHEETS on your bed the night before your surgery ? ?DO NOT SLEEP WITH PETS. ? ? ?Day of Surgery: ? ?Take a shower with CHG soap. ?Wear Clean/Comfortable clothing the morning of surgery ?Do not apply any deodorants/lotions.   ?Remember to brush your teeth WITH YOUR REGULAR TOOTHPASTE. ? ? ? ?If you received a COVID test during your pre-op visit  it is requested that  you wear a mask when out in public, stay away from anyone that may not be feeling well and notify your surgeon if you develop symptoms. If you have been in contact with anyone that has tested positive in the last 10 days please notify you surgeon. ? ?  ?Please read over the following fact sheets that you were given.  ? ?

## 2021-11-30 ENCOUNTER — Inpatient Hospital Stay (HOSPITAL_COMMUNITY)
Admission: RE | Admit: 2021-11-30 | Discharge: 2021-11-30 | Disposition: A | Discharge status: Home / Self Care | Payer: BC Managed Care – PPO | Source: Ambulatory Visit

## 2021-11-30 NOTE — Progress Notes (Signed)
1418 ?Left message for patient on voicemail and asked him to call and let us know if he was still planning on coming to his pre admission testing appointment that was scheduled for 2pm.  ?

## 2021-12-01 DIAGNOSIS — E519 Thiamine deficiency, unspecified: Secondary | ICD-10-CM | POA: Insufficient documentation

## 2021-12-01 LAB — VITAMIN B1: Vitamin B1 (Thiamine): 8 nmol/L (ref 8–30)

## 2021-12-01 LAB — ALDOSTERONE + RENIN ACTIVITY W/ RATIO
ALDO / PRA Ratio: 1.1 Ratio (ref 0.9–28.9)
Aldosterone: 12 ng/dL
Renin Activity: 10.95 ng/mL/h — ABNORMAL HIGH (ref 0.25–5.82)

## 2021-12-01 MED ORDER — THIAMINE HCL 100 MG PO TABS: 100.0000 mg | ORAL_TABLET | Freq: Every day | ORAL | 1 refills | Status: DC

## 2021-12-13 DIAGNOSIS — M87051 Idiopathic aseptic necrosis of right femur: Secondary | ICD-10-CM

## 2021-12-14 ENCOUNTER — Other Ambulatory Visit: Payer: Self-pay

## 2021-12-14 ENCOUNTER — Encounter (HOSPITAL_COMMUNITY)
Admission: RE | Admit: 2021-12-14 | Discharge: 2021-12-14 | Disposition: A | Discharge status: Home / Self Care | Payer: BC Managed Care – PPO | Source: Ambulatory Visit | Attending: Orthopaedic Surgery | Admitting: Orthopaedic Surgery

## 2021-12-14 ENCOUNTER — Encounter (HOSPITAL_COMMUNITY): Payer: Self-pay

## 2021-12-14 ENCOUNTER — Telehealth: Payer: Self-pay

## 2021-12-14 VITALS — BP 125/83 | HR 60 | Temp 98.0°F | Resp 17 | Ht 73.0 in | Wt 200.1 lb

## 2021-12-14 DIAGNOSIS — Z01812 Encounter for preprocedural laboratory examination: Secondary | ICD-10-CM | POA: Insufficient documentation

## 2021-12-14 DIAGNOSIS — M87051 Idiopathic aseptic necrosis of right femur: Secondary | ICD-10-CM | POA: Insufficient documentation

## 2021-12-14 DIAGNOSIS — Z01818 Encounter for other preprocedural examination: Secondary | ICD-10-CM

## 2021-12-14 HISTORY — DX: Epistaxis: R04.0

## 2021-12-14 HISTORY — DX: Headache, unspecified: R51.9

## 2021-12-14 HISTORY — DX: Anemia, unspecified: D64.9

## 2021-12-14 HISTORY — DX: Depression, unspecified: F32.A

## 2021-12-14 LAB — TYPE AND SCREEN
ABO/RH(D): O POS
Antibody Screen: NEGATIVE

## 2021-12-14 LAB — COMPREHENSIVE METABOLIC PANEL
ALT: 20 U/L (ref 0–44)
AST: 17 U/L (ref 15–41)
Albumin: 4.2 g/dL (ref 3.5–5.0)
Alkaline Phosphatase: 43 U/L (ref 38–126)
Anion gap: 6 (ref 5–15)
BUN: 20 mg/dL (ref 6–20)
CO2: 25 mmol/L (ref 22–32)
Calcium: 9.1 mg/dL (ref 8.9–10.3)
Chloride: 108 mmol/L (ref 98–111)
Creatinine, Ser: 1.16 mg/dL (ref 0.61–1.24)
GFR, Estimated: >60 mL/min (ref >60)
Glucose, Bld: 90 mg/dL (ref 70–99)
Potassium: 4.1 mmol/L (ref 3.5–5.1)
Sodium: 139 mmol/L (ref 135–145)
Total Bilirubin: 0.6 mg/dL (ref 0.3–1.2)
Total Protein: 6.7 g/dL (ref 6.5–8.1)

## 2021-12-14 LAB — CBC
HCT: 34.6 % — ABNORMAL LOW (ref 39.0–52.0)
Hemoglobin: 11.9 g/dL — ABNORMAL LOW (ref 13.0–17.0)
MCH: 34 pg (ref 26.0–34.0)
MCHC: 34.4 g/dL (ref 30.0–36.0)
MCV: 98.9 fL (ref 80.0–100.0)
Platelets: 170 10*3/uL (ref 150–400)
RBC: 3.5 MIL/uL — ABNORMAL LOW (ref 4.22–5.81)
RDW: 12.6 % (ref 11.5–15.5)
WBC: 4.2 10*3/uL (ref 4.0–10.5)
nRBC: 0 % (ref 0.0–0.2)

## 2021-12-14 LAB — SURGICAL PCR SCREEN
MRSA, PCR: NEGATIVE
Staphylococcus aureus: NEGATIVE

## 2021-12-14 NOTE — Progress Notes (Signed)
PCP - Scarlette Calico, MD ?Cardiologist - denies ? ?PPM/ICD - denies ?Device Orders - n/a ?Rep Notified - n/a ? ?Chest x-ray - n/a ?EKG - 11/17/2021  ?Stress Test - denies ?ECHO - 02/24/2011 - [facility] ?Cardiac Cath - denies ? ?Sleep Study - yes, negative for OSA per patient ?CPAP - n/a ? ?Fasting Blood Sugar - n/a ? ?Blood Thinner Instructions: n/a ? ?Aspirin Instructions: Patient was instructed: As of today, STOP taking any Aspirin (unless otherwise instructed by your surgeon) Aleve, Naproxen, Ibuprofen, Motrin, Advil, Goody's, BC's, all herbal medications, fish oil, and all vitamins. ? ?ERAS Protcol - yes ?PRE-SURGERY Ensure - yes, until 06:45 ? ?COVID TEST- n/a ? ? ?Anesthesia review: yes - recent hospitalization for recurrent epistaxis (11/15/21) ? ?Patient denies shortness of breath, fever, cough and chest pain at PAT appointment ? ? ?All instructions explained to the patient, with a verbal understanding of the material. Patient agrees to go over the instructions while at home for a better understanding. Patient also instructed to self quarantine after being tested for COVID-19. The opportunity to ask questions was provided. ?  ?

## 2021-12-14 NOTE — Progress Notes (Addendum)
Surgical Instructions ? ? ? Your procedure is scheduled on Tuesday, April 25th, 2023. ? ? Report to [facility] Main Entrance "A" at 07:45 A.M., then check in with the Admitting office. ? Call this number if you have problems the morning of surgery: ? [phone] ? ? If you have any questions prior to your surgery date call [phone]: Open Monday-Friday 8am-4pm ? ? ? Remember: ? Do not eat after midnight the night before your surgery ? ?You may drink clear liquids until 06:45 the afternoon of your surgery.   ?Clear liquids allowed are: Water, Non-Citrus Juices (without pulp), Carbonated Beverages, Clear Tea, Black Coffee ONLY (NO MILK, CREAM OR POWDERED CREAMER of any kind), and Gatorade ? ?Patient Instructions ? ?The night before surgery:  ?No food after midnight. ONLY clear liquids after midnight ? ?The day of surgery (if you do NOT have diabetes):  ?Drink ONE (1) Pre-Surgery Clear Ensure by 06:45 the morning of surgery. Drink in one sitting. Do not sip.  ?This drink was given to you during your hospital  ?pre-op appointment visit. ? ?Nothing else to drink after completing the  ?Pre-Surgery Clear Ensure. ? ?       If you have questions, please contact your surgeon?s office.  ? ?  ? Take these medicines the morning of surgery with A SIP OF WATER: ? ?nebivolol (BYSTOLIC) 5 MG tablet ?busPIRone (BUSPAR) 30 MG tablet ?Oxcarbazepine (TRILEPTAL) 300 MG tablet ?vortioxetine HBr (TRINTELLIX) 5 MG TABS tablet ? ?IF NEEDED  ?acetaminophen (TYLENOL) 500 MG tablet ?EPINEPHrine 0.3 mg/0.3 mL IJ SOAJ injection ? ?As of today, STOP taking any Aspirin (unless otherwise instructed by your surgeon) Aleve, Naproxen, Ibuprofen, Motrin, Advil, Goody's, BC's, all herbal medications, fish oil, and all vitamins. ? ? ? The day of surgery: ?         ?Do not wear jewelry  ?Do not wear lotions, powders, colognes, or deodorant. ?Men may shave face and neck. ?Do not bring valuables to the hospital. ? ? ?[facility] is not responsible for  any belongings or valuables. .  ? ?Do NOT Smoke (Tobacco/Vaping)  24 hours prior to your procedure ? ?If you use a CPAP at night, you may bring your mask for your overnight stay. ?  ?Contacts, glasses, hearing aids, dentures or partials may not be worn into surgery, please bring cases for these belongings ?  ?For patients admitted to the hospital, discharge time will be determined by your treatment team. ?  ?Patients discharged the day of surgery will not be allowed to drive home, and someone needs to stay with them for 24 hours. ? ? ?SURGICAL WAITING ROOM VISITATION ?Patients having surgery or a procedure in a hospital may have two support people. ?Children under the age of 80 must have an adult with them who is not the patient. ?They may stay in the waiting area during the procedure and may switch out with other visitors. If the patient needs to stay at the hospital during part of their recovery, the visitor guidelines for inpatient rooms apply. ? ?Please refer to the [facility] website for the visitor guidelines for Inpatients (after your surgery is over and you are in a regular room).  ? ? ?Special instructions:   ? ?Oral Hygiene is also important to reduce your risk of infection.  Remember - BRUSH YOUR TEETH THE MORNING OF SURGERY WITH YOUR REGULAR TOOTHPASTE ? ? ?[facility]- Preparing For Surgery ? ?Before surgery, you can play an important role. Because skin is not  sterile, your skin needs to be as free of germs as possible. You can reduce the number of germs on your skin by washing with CHG (chlorahexidine gluconate) Soap before surgery.  CHG is an antiseptic cleaner which kills germs and bonds with the skin to continue killing germs even after washing.   ? ? ?Please do not use if you have an allergy to CHG or antibacterial soaps. If your skin becomes reddened/irritated stop using the CHG.  ?Do not shave (including legs and underarms) for at least 48 hours prior to first CHG shower. It is OK to shave your  face. ? ?Please follow these instructions carefully. ?  ? ? Shower the NIGHT BEFORE SURGERY and the MORNING OF SURGERY with CHG Soap.  ? If you chose to wash your hair, wash your hair first as usual with your normal shampoo. After you shampoo, rinse your hair and body thoroughly to remove the shampoo.  Then [employer] and genitals (private parts) with your normal soap and rinse thoroughly to remove soap. ? ?After that Use CHG Soap as you would any other liquid soap. You can apply CHG directly to the skin and wash gently with a scrungie or a clean washcloth.  ? ?Apply the CHG Soap to your body ONLY FROM THE NECK DOWN.  Do not use on open wounds or open sores. Avoid contact with your eyes, ears, mouth and genitals (private parts). Wash Face and genitals (private parts)  with your normal soap.  ? ?Wash thoroughly, paying special attention to the area where your surgery will be performed. ? ?Thoroughly rinse your body with warm water from the neck down. ? ?DO NOT shower/wash with your normal soap after using and rinsing off the CHG Soap. ? ?Pat yourself dry with a CLEAN TOWEL. ? ?Wear CLEAN PAJAMAS to bed the night before surgery ? ?Place CLEAN SHEETS on your bed the night before your surgery ? ?DO NOT SLEEP WITH PETS. ? ? ?Day of Surgery: ? ?Take a shower with CHG soap. ?Wear Clean/Comfortable clothing the morning of surgery ?Do not apply any deodorants/lotions.   ?Remember to brush your teeth WITH YOUR REGULAR TOOTHPASTE. ? ? ? ?If you received a COVID test during your pre-op visit  it is requested that you wear a mask when out in public, stay away from anyone that may not be feeling well and notify your surgeon if you develop symptoms. If you have been in contact with anyone that has tested positive in the last 10 days please notify you surgeon. ? ?  ?Please read over the following fact sheets that you were given.  ? ?

## 2021-12-14 NOTE — Telephone Encounter (Signed)
Spoke with pt to confirm new pt apt for 4/19. ?

## 2021-12-15 ENCOUNTER — Inpatient Hospital Stay: Payer: BC Managed Care – PPO

## 2021-12-15 ENCOUNTER — Encounter (HOSPITAL_COMMUNITY): Payer: Self-pay

## 2021-12-15 ENCOUNTER — Inpatient Hospital Stay: Payer: BC Managed Care – PPO | Attending: Hematology and Oncology | Admitting: Hematology and Oncology

## 2021-12-15 VITALS — BP 121/76 | HR 58 | Temp 98.0°F | Resp 17 | Wt 202.8 lb

## 2021-12-15 DIAGNOSIS — R040 Epistaxis: Secondary | ICD-10-CM | POA: Diagnosis not present

## 2021-12-15 DIAGNOSIS — F32A Depression, unspecified: Secondary | ICD-10-CM | POA: Insufficient documentation

## 2021-12-15 DIAGNOSIS — D689 Coagulation defect, unspecified: Secondary | ICD-10-CM | POA: Diagnosis not present

## 2021-12-15 DIAGNOSIS — Z87891 Personal history of nicotine dependence: Secondary | ICD-10-CM | POA: Insufficient documentation

## 2021-12-15 DIAGNOSIS — I10 Essential (primary) hypertension: Secondary | ICD-10-CM | POA: Diagnosis not present

## 2021-12-15 LAB — CMP (CANCER CENTER ONLY)
ALT: 19 U/L (ref 0–44)
AST: 15 U/L (ref 15–41)
Albumin: 4.4 g/dL (ref 3.5–5.0)
Alkaline Phosphatase: 42 U/L (ref 38–126)
Anion gap: 4 — ABNORMAL LOW (ref 5–15)
BUN: 20 mg/dL (ref 6–20)
CO2: 26 mmol/L (ref 22–32)
Calcium: 8.9 mg/dL (ref 8.9–10.3)
Chloride: 106 mmol/L (ref 98–111)
Creatinine: 1.03 mg/dL (ref 0.61–1.24)
GFR, Estimated: >60 mL/min (ref >60)
Glucose, Bld: 90 mg/dL (ref 70–99)
Potassium: 3.9 mmol/L (ref 3.5–5.1)
Sodium: 136 mmol/L (ref 135–145)
Total Bilirubin: 0.3 mg/dL (ref 0.3–1.2)
Total Protein: 7.1 g/dL (ref 6.5–8.1)

## 2021-12-15 LAB — PROTIME-INR
INR: 1 (ref 0.8–1.2)
Prothrombin Time: 13 seconds (ref 11.4–15.2)

## 2021-12-15 LAB — CBC WITH DIFFERENTIAL (CANCER CENTER ONLY)
Abs Immature Granulocytes: 0.01 K/uL (ref 0.00–0.07)
Basophils Absolute: 0 K/uL (ref 0.0–0.1)
Basophils Relative: 1 %
Eosinophils Absolute: 0.1 K/uL (ref 0.0–0.5)
Eosinophils Relative: 3 %
HCT: 33.9 % — ABNORMAL LOW (ref 39.0–52.0)
Hemoglobin: 11.6 g/dL — ABNORMAL LOW (ref 13.0–17.0)
Immature Granulocytes: 0 %
Lymphocytes Relative: 37 %
Lymphs Abs: 1.5 K/uL (ref 0.7–4.0)
MCH: 32.9 pg (ref 26.0–34.0)
MCHC: 34.2 g/dL (ref 30.0–36.0)
MCV: 96 fL (ref 80.0–100.0)
Monocytes Absolute: 0.4 K/uL (ref 0.1–1.0)
Monocytes Relative: 10 %
Neutro Abs: 1.9 K/uL (ref 1.7–7.7)
Neutrophils Relative %: 49 %
Platelet Count: 145 K/uL — ABNORMAL LOW (ref 150–400)
RBC: 3.53 MIL/uL — ABNORMAL LOW (ref 4.22–5.81)
RDW: 12.4 % (ref 11.5–15.5)
WBC Count: 3.9 K/uL — ABNORMAL LOW (ref 4.0–10.5)
nRBC: 0 % (ref 0.0–0.2)

## 2021-12-15 LAB — PLATELET FUNCTION ASSAY: Collagen / Epinephrine: 177 s (ref 0–193)

## 2021-12-15 LAB — APTT: aPTT: 34 s (ref 24–36)

## 2021-12-15 NOTE — Progress Notes (Addendum)
Anesthesia Chart Review: ? Case: [id number] Date/Time: 12/21/21 0927  ? Procedure: RIGHT TOTAL HIP ARTHROPLASTY ANTERIOR APPROACH (Right: Hip) - 3-C  ? Anesthesia type: Spinal  ? Pre-op diagnosis: right hip avascular necrosis  ? Location: [facility] OR ROOM 06 / [place] [facility]  ? Surgeons: Leandrew Koyanagi, MD  ? ?  ? ? ?DISCUSSION: Patient is a 46 year old male scheduled for the above procedure. ? 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 Derrill Memo with 70% removal of the tumor. Craniotomy 02/25/11, s/p Metronomic Temodar chemotherapy 03/03/11-12/27/10, radiation 2013 for MRI progression, Immunotherapy/Avastin Q 2 weeks 09/07/11-06/26/13 for progression), seizures, PTSD, epistaxis (s/p embolization of bilateral sphenopalatine arteries by IR 11/16/21; recurrent left nasal bleeding 11/21/21 after NasoPore fell out with 7.5 Rhino pack placed in ED), anemia, avascular necrosis (bilateral hips 10/23/21), skin cancer ([place] excision 1995).   ? ?He was drinking 14 beers per week, quit 11/08/21.  ? ?He had PCP follow-up with Dr. Ronnald Ramp on 11/22/21.  He said he quit drinking because he felt alcohol may be contributing to his wide swings in his blood pressure.  Bystolic 5 mg daily added for HTN.  Iron studies ordered. Thiamine level was borderline low, so supplements started. Referred to hematology. 12/14/21 labs showed stable/improved H/H 11.9/34.6, normal PLT count and LFTs.  ? ?Had ENT follow-up with Dr. Roseanna Rainbow on 11/26/2021.  The Rhino Rocket in the left naris was removed.  Following removal nasal passage was suctioned free of clot and mucus.  The nasal passage was packed with a cotton ball soaked with topical anesthetic.  After removal, inspection of the nasal cavity using nasal endoscopy was done and signed you phone dissolvable nasal packing placed in the left nasal cavity per manufacturer instructions.  No bleeding was noted.   He was to use nasal saline rinses and avoid heavy lifting, nose blowing or straining for the next 1 to 2 weeks.  He was referred to hematologist for evaluation of possible underlying bleeding disorder.  2 to 3-week follow-up planned. ? ?He is seeing hematologist Dr. Lorenso Courier on 12/15/21. Will leave chart for follow-up and also notify Dr. Phoebe Sharps scheduler. ? ?ADDENDUM 12/16/21 9:28 AM: ?Reviewed Dr. Libby Maw 12/15/21 note. PT/PTT and platelet function assay were normal on 12/15/21. CBC and CMP also done on 12/15/21 and showed stable H/H 11.6/33.9, PLT count 145K. WBC 3.9. Normal CMET except for anion gap of 4. Patient had not had a issue with nosebleeds until recently. Previous cocaine use, but not in years. Prior wisdom teeth without any major bleeding. He wrote: ?"# Nosebleeds ?# Concern for Coagulation Disorder ?-- No need to delay surgery at this time as there is no evidence of a coagulation disorder ?--We will order full coagulation studies to include PT, INR, PTT, and platelet function assay ?--We will additionally order CBC and CMP.  Hepatitis serologies have been ordered in the past 6 months ?--Recommend routine DVT prophylaxis following his procedure.  Close attention for possible nosebleeds.  We will prefer Lovenox as prophylaxis over DOAC therapy ?--Return to clinic on an as-needed basis." ? ? ?VS: BP 125/83   Pulse 60   Temp 36.7 ?C (Oral)   Resp 17   Ht '6\' 1"'$  (1.854 m)   Wt 90.8 kg   SpO2 100%   BMI 26.40 kg/m?  ? ? ?PROVIDERS: ?Janith Lima, MD is PCP ?Skotnicki, Meghan, DO is ENT ?Kandis Nab, MD is HEM-[facility]).  Last  visit 04/14/2021 with Crissie Reese, [place]. ?Sheran Spine, MD is HEM ? ? ?LABS: Labs reviewed: Acceptable for surgery. PT/PTT and platelet function assay were normal on 12/15/21. CBC and CMP also done on 12/15/21 and showed stable H/H 11.6/33.9, PLT count 145K. WBC 3.9. Normal CMET except for anion gap of 4.   ?(all labs ordered are listed, but only abnormal results are  displayed) ? ?Labs Reviewed  ?CBC - Abnormal; Notable for the following components:  ?    Result Value  ? RBC 3.50 (*)   ? Hemoglobin 11.9 (*)   ? HCT 34.6 (*)   ? All other components within normal limits  ?SURGICAL PCR SCREEN  ?COMPREHENSIVE METABOLIC PANEL  ?TYPE AND SCREEN  ? ? ?PSG with parasomnia montage/expanded EEG Polysomnogram 11/01/17: ?IMPRESSION:  ?1. No evidence of clinically significant Obstructive Sleep Apnea  ?(OSA)  ?2. No REM sleep disorder identified, neither Periodic Limb  ?Movement Disorder (PLMD)  ?3. Mild Snoring  ?4. We did not capture Confusional Arousals, Sleepwalking, Sleep  ?Terrors, or other Parasomnias.  ?RECOMMENDATIONS: Treat as PTSD/ Confusional arousals- with  ?Prazosin or Doxazosin.  ?Watch for hyponatremia related confusion/ disorientation. Refrain  ?from all alcohol use...  ? ? ?IMAGES: ?1V PCXR 11/15/21: ?FINDINGS: ?The heart size and mediastinal contours are within normal limits. ?Both lungs are clear. The visualized skeletal structures are ?unremarkable. ?IMPRESSION: ?No active disease. ? ?MRI Bilateral Hips 10/23/21: ?IMPRESSION: ?Bilateral superior femoral head moderate avascular necrosis. Early ?minimal right-greater-than-left superior femoral head cortical ?flattening. This is consistent with early stage III avascular ?necrosis (with minimal early cortical collapse). ? ?MRI Brain 04/14/21 ([facility]) ?IMPRESSION:  ?Stable examination without evidence of disease progression.  Brain tumor  ?follow-up score 2a: Tumor-related abnormalities are unchanged allowing for  ?slice selection and technical factors.   ? ? ?EKG: 11/15/21: SR ? ? ?CV: ?Echo 04/13/12 ([facility]): ?INTERPRETATION(S)  ?1.  Normal LV systolic function.  ?2.  Trace MR, trace TR.  ?3.  No valvular stenosis.    ? ? ?Past Medical History:  ?Diagnosis Date  ? Anemia   ? Avascular necrosis ([place])   ? Brain cancer ([place]) 02/25/2011  ? grade III anaplastic ologodendrglioma  ? Depression   ? Headache   ? Hypertension   ? PTSD  (post-traumatic stress disorder)   ? Seizures ([place])   ? ? ?Past Surgical History:  ?Procedure Laterality Date  ? BASAL CELL CARCINOMA EXCISION  1995  ? CRANIOTOMY FOR TUMOR Right 02/25/2011  ? IR ANGIO EXTERNAL CAROTID SEL EXT CAROTID BILAT MOD SED  11/16/2021  ? IR ANGIO INTRA EXTRACRAN SEL COM CAROTID INNOMINATE UNI R MOD SED  11/16/2021  ? IR ANGIO INTRA EXTRACRAN SEL INTERNAL CAROTID UNI L MOD SED  11/16/2021  ? IR ANGIO VERTEBRAL SEL VERTEBRAL UNI R MOD SED  11/16/2021  ? IR ANGIOGRAM FOLLOW UP STUDY  11/17/2021  ? IR NEURO EACH ADD'L AFTER BASIC UNI LEFT (MS)  11/17/2021  ? IR NEURO EACH ADD'L AFTER BASIC UNI RIGHT (MS)  11/17/2021  ? IR TRANSCATH/EMBOLIZ  11/16/2021  ? IR US GUIDE VASC ACCESS RIGHT  11/16/2021  ? RADIOLOGY WITH ANESTHESIA N/A 11/16/2021  ? Procedure: IR WITH ANESTHESIA;  Surgeon: Pedro Earls, MD;  Location: [place];  Service: Radiology;  Laterality: N/A;  ? WISDOM TOOTH EXTRACTION    ? ? ?MEDICATIONS: ? acetaminophen (TYLENOL) 500 MG tablet  ? benazepril (LOTENSIN) 10 MG tablet  ? busPIRone (BUSPAR) 30 MG tablet  ? EPINEPHrine 0.3 mg/0.3  mL IJ SOAJ injection  ? nebivolol (BYSTOLIC) 5 MG tablet  ? Oxcarbazepine (TRILEPTAL) 300 MG tablet  ? thiamine 100 MG tablet  ? vortioxetine HBr (TRINTELLIX) 5 MG TABS tablet  ? ?No current facility-administered medications for this encounter.  ? ? ?Myra Gianotti, PA-C ?Surgical Short Stay/Anesthesiology ?[facility] Phone [phone] ?[facility] Phone [phone] ?12/15/2021 2:36 PM ? ? ? ? ? ? ?

## 2021-12-15 NOTE — Progress Notes (Signed)
?[place] ?[phone]   [phone] ? ?INITIAL CONSULT NOTE ? ?Patient Care Team: ?Janith Lima, MD as PCP - General (Internal Medicine) ?Lujean Amel, MD as Consulting Physician (Family Medicine) ?Marius Ditch, MD as Attending Physician (Internal Medicine) ?Casimiro Needle, DO as Referring Physician ? ?Hematological/Oncological History ?# Nosebleeds ?# Concern for Coagulation Disorder ?12/15/2021: establish care with Dr. Lorenso Courier  ? ?CHIEF COMPLAINTS/PURPOSE OF CONSULTATION:  ?"Concern for coagulation disorder/nosebleeds " ? ?HISTORY OF PRESENTING ILLNESS:  ?Shawn York 46 y.o. male with medical history significant for grade 3 anaplastic oligodendroglioma status post resection and chemoradiation, hypertension, seizures, avascular necrosis of the hip who presents for evaluation of nosebleeds and concern for coagulation disorder. ? ?On review of the previous records Shawn York has been having frequent issues with nosebleeds over the last 3 weeks.  He has had at least 5 visits emergency department and recently underwent an IR embolization ofbilateral sphenopalatine artery. He has established care with Dr. Mirian Mo [facility] with ENT.  Due to concern for possible underlying coagulation disorder the patient was referred to hematology for further evaluation and management ? ?On exam today Shawn York notes that the nosebleeds are new phenomena.  He reports that he is only been going on over the last 3 weeks.  He has had no history of nosebleeds before in the past.  He has undergone other surgical procedures including resection of oligodendroglioma in 2012 with no major complications result of that surgery.  Additionally he underwent wisdom teeth resection without any major bleeding.  He reports that during the course this time he did have black stools concerning for GI bleed but no bright red bleeding from his gums, urinary tract, or GI tract. ? ?On further  discussion he notes that his family history is remarkable for an aneurysm in his brother.  He has a healthy mother and father no family history of bleeding disorders.  He is a former smoker having quit in 2012.  He did have some cocaine usage in the past but is not used it for many many years.  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  He otherwise denies any issues with fevers, chills, sweats, nausea, ming or diarrhea.  Full 10 point ROS is listed below. ? ?MEDICAL HISTORY:  ?Past Medical History:  ?Diagnosis Date  ? Anemia   ? Avascular necrosis ([place])   ? Brain cancer ([place]) 02/25/2011  ? grade III anaplastic ologodendrglioma  ? Depression   ? Epistaxis   ? Headache   ? Hypertension   ? PTSD (post-traumatic stress disorder)   ? Seizures ([place])   ? ? ?SURGICAL HISTORY: ?Past Surgical History:  ?Procedure Laterality Date  ? BASAL CELL CARCINOMA EXCISION  1995  ? CRANIOTOMY FOR TUMOR Right 02/25/2011  ? IR ANGIO EXTERNAL CAROTID SEL EXT CAROTID BILAT MOD SED  11/16/2021  ? IR ANGIO INTRA EXTRACRAN SEL COM CAROTID INNOMINATE UNI R MOD SED  11/16/2021  ? IR ANGIO INTRA EXTRACRAN SEL INTERNAL CAROTID UNI L MOD SED  11/16/2021  ? IR ANGIO VERTEBRAL SEL VERTEBRAL UNI R MOD SED  11/16/2021  ? IR ANGIOGRAM FOLLOW UP STUDY  11/17/2021  ? IR NEURO EACH ADD'L AFTER BASIC UNI LEFT (MS)  11/17/2021  ? IR NEURO EACH ADD'L AFTER BASIC UNI RIGHT (MS)  11/17/2021  ? IR TRANSCATH/EMBOLIZ  11/16/2021  ? IR US GUIDE VASC ACCESS RIGHT  11/16/2021  ? RADIOLOGY WITH ANESTHESIA N/A 11/16/2021  ? Procedure:  IR WITH ANESTHESIA;  Surgeon: Pedro Earls, MD;  Location: [place];  Service: Radiology;  Laterality: N/A;  ? WISDOM TOOTH EXTRACTION    ? ? ?SOCIAL HISTORY: ?Social History  ? ?Socioeconomic History  ? Marital status: Married  ?  Spouse name: Not on file  ? Number of children: Not on file  ? Years of education: Not on file  ? Highest education level: Not on file  ?Occupational History  ? Not on file   ?Tobacco Use  ? Smoking status: Former  ?  Types: Cigarettes  ?  Quit date: 02/25/2011  ?  Years since quitting: 10.8  ? Smokeless tobacco: Never  ?Vaping Use  ? Vaping Use: Never used  ?Substance and Sexual Activity  ? Alcohol use: Not Currently  ?  Alcohol/week: 14.0 standard drinks  ?  Types: 14 Cans of beer per week  ?  Comment: quit 11-08-2021  ? Drug use: No  ? Sexual activity: Yes  ?  Comment: E-cigarette users  ?Other Topics Concern  ? Not on file  ?Social History Narrative  ? Not on file  ? ?Social Determinants of Health  ? ?Financial Resource Strain: Not on file  ?Food Insecurity: Not on file  ?Transportation Needs: Not on file  ?Physical Activity: Not on file  ?Stress: Not on file  ?Social Connections: Not on file  ?Intimate Partner Violence: Not on file  ? ? ?FAMILY HISTORY: ?Family History  ?Problem Relation Age of Onset  ? Cancer Other   ?     brain cancer  ? Cancer Father   ?     skin cancer  ? Cancer Maternal Grandmother 55  ?     brain cancer  ? Alcohol abuse Brother   ? Alzheimer's disease Paternal Grandmother   ? Dementia Paternal Grandmother   ? Heart disease Paternal Grandfather   ? ? ?ALLERGIES:  is allergic to iodinated contrast media, penicillins, shellfish allergy, and shellfish-derived products. ? ?MEDICATIONS:  ?Current Outpatient Medications  ?Medication Sig Dispense Refill  ? nebivolol (BYSTOLIC) 5 MG tablet Take 1 tablet by mouth daily.    ? acetaminophen (TYLENOL) 500 MG tablet Take 1,000 mg by mouth every 6 (six) hours as needed for moderate pain or headache.    ? busPIRone (BUSPAR) 30 MG tablet Take 1 tablet (30 mg total) by mouth 2 (two) times daily. 180 tablet 1  ? EPINEPHrine 0.3 mg/0.3 mL IJ SOAJ injection Inject 0.3 mg into the muscle once as needed (for a severe, allergic reaction).     ? Oxcarbazepine (TRILEPTAL) 300 MG tablet Take 1 tablet (300 mg total) by mouth 2 (two) times daily. 60 tablet 0  ? thiamine 100 MG tablet Take 1 tablet (100 mg total) by mouth daily. 90 tablet 1   ? vortioxetine HBr (TRINTELLIX) 5 MG TABS tablet Take 1 tablet (5 mg total) by mouth daily. 30 tablet 0  ? ?No current facility-administered medications for this visit.  ? ? ?REVIEW OF SYSTEMS:   ?Constitutional: ( - ) fevers, ( - )  chills , ( - ) night sweats ?Eyes: ( - ) blurriness of vision, ( - ) double vision, ( - ) watery eyes ?Ears, nose, mouth, throat, and face: ( - ) mucositis, ( - ) sore throat ?Respiratory: ( - ) cough, ( - ) dyspnea, ( - ) wheezes ?Cardiovascular: ( - ) palpitation, ( - ) chest discomfort, ( - ) lower extremity swelling ?Gastrointestinal:  ( - ) nausea, ( - )  heartburn, ( - ) change in bowel habits ?Skin: ( - ) abnormal skin rashes ?Lymphatics: ( - ) new lymphadenopathy, ( - ) easy bruising ?Neurological: ( - ) numbness, ( - ) tingling, ( - ) new weaknesses ?Behavioral/Psych: ( - ) mood change, ( - ) new changes  ?All other systems were reviewed with the patient and are negative. ? ?PHYSICAL EXAMINATION: ? ?Vitals:  ? 12/15/21 1407  ?BP: 121/76  ?Pulse: (!) 58  ?Resp: 17  ?Temp: 98 ?F (36.7 ?C)  ?SpO2: 99%  ? ?Filed Weights  ? 12/15/21 1407  ?Weight: 202 lb 12.8 oz (92 kg)  ? ? ?GENERAL: well appearing middle-aged Caucasian male in NAD  ?SKIN: skin color, texture, turgor are normal, no rashes or significant lesions ?EYES: conjunctiva are pink and non-injected, sclera clear ?LUNGS: clear to auscultation and percussion with normal breathing effort ?HEART: regular rate & rhythm and no murmurs and no lower extremity edema ?Musculoskeletal: no cyanosis of digits and no clubbing  ?PSYCH: alert & oriented x 3, fluent speech ?NEURO: no focal motor/sensory deficits ? ?LABORATORY DATA:  ?I have reviewed the data as listed ? ?  Latest Ref Rng & Units 12/15/2021  ?  3:22 PM 12/14/2021  ?  4:00 PM 11/21/2021  ?  8:40 PM  ?CBC  ?WBC 4.0 - 10.5 K/uL 3.9   4.2   4.5    ?Hemoglobin 13.0 - 17.0 g/dL 11.6   11.9   10.6    ?Hematocrit 39.0 - 52.0 % 33.9   34.6   30.6    ?Platelets 150 - 400 K/uL 145   170    199    ? ? ? ?  Latest Ref Rng & Units 12/15/2021  ?  3:22 PM 12/14/2021  ?  4:00 PM 11/17/2021  ?  6:58 AM  ?CMP  ?Glucose 70 - 99 mg/dL 90   90   91    ?BUN 6 - 20 mg/dL '20   20   10    '$ ?Creatinine 0.61 - 1.24 mg/dL

## 2021-12-16 ENCOUNTER — Encounter: Payer: Self-pay | Admitting: Hematology and Oncology

## 2021-12-16 ENCOUNTER — Telehealth: Payer: Self-pay

## 2021-12-16 NOTE — Telephone Encounter (Signed)
-----   Message from Orson Slick, MD sent at 12/16/2021  9:33 AM EDT ----- ?Please let Mr. Needs know that his blood work showed no clear evidence of a coagulation disorder.  I do not suspect he has any underlying problems with his blood.  It is okay for him to proceed with his hip surgery.  There is no need for routine follow-up in our clinic. ?----- Message ----- ?From: Interface, Lab In [facility] ?Sent: 12/15/2021   3:38 PM EDT ?To: Orson Slick, MD ? ? ?

## 2021-12-16 NOTE — Anesthesia Preprocedure Evaluation (Addendum)
Anesthesia Evaluation  ?Patient identified by MRN, date of birth, ID band ?Patient awake ? ? ? ?Reviewed: ?Allergy & Precautions, NPO status , Patient's Chart, lab work & pertinent test results, reviewed documented beta blocker date and time  ? ?Airway ?Mallampati: III ? ?TM Distance: >3 FB ?Neck ROM: Full ? ? ? Dental ?no notable dental hx. ?(+) Teeth Intact, Dental Advisory Given ?  ?Pulmonary ?former smoker,  ?  ?breath sounds clear to auscultation ? ? ?(-) rales ? ? ? Cardiovascular ?hypertension, Pt. on medications and Pt. on home beta blockers ?Normal cardiovascular exam ?Rhythm:Regular Rate:Normal ? ?Hx/o epistaxis 12/15/21 no coagulopathy ?  ?Neuro/Psych ? Headaches, Seizures -, Well Controlled,  PSYCHIATRIC DISORDERS Anxiety Depression PTSDHx/o Grade III anaplastic oligodendroglioma S/P craniotomy 01/2011 ? ?  ? GI/Hepatic ?negative GI ROS, Neg liver ROS,   ?Endo/Other  ?negative endocrine ROS ? Renal/GU ?negative Renal ROS  ?negative genitourinary ?  ?Musculoskeletal ?AVN Right hip  ? Abdominal ?  ?Peds ? Hematology ? ?(+) Blood dyscrasia, anemia ,   ?Anesthesia Other Findings ? ? Reproductive/Obstetrics ? ?  ? ? ? ? ? ? ? ? ? ? ? ? ? ?  ?  ? ? ? ? ? ? ? ?Anesthesia Physical ?Anesthesia Plan ? ?ASA: 2 ? ?Anesthesia Plan: Spinal  ? ?Post-op Pain Management:   ? ?Induction: Intravenous ? ?PONV Risk Score and Plan: 2 and Treatment may vary due to age or medical condition, Midazolam, Dexamethasone and Ondansetron ? ?Airway Management Planned: Natural Airway and Simple Face Mask ? ?Additional Equipment: None ? ?Intra-op Plan:  ? ?Post-operative Plan:  ? ?Informed Consent: I have reviewed the patients History and Physical, chart, labs and discussed the procedure including the risks, benefits and alternatives for the proposed anesthesia with the patient or authorized representative who has indicated his/her understanding and acceptance.  ? ? ? ?Dental advisory given ? ?Plan  Discussed with: CRNA and Anesthesiologist ? ?Anesthesia Plan Comments: (See PAT note written 12/16/2021 by Myra Gianotti, PA-C. Hematology evaluation on 12/15/21 for epistaxis. Per Dr. Lorenso Courier, "No need to delay surgery at this time as there is no evidence of a coagulation disorder.")  ? ? ? ? ? ?Anesthesia Quick Evaluation ? ?

## 2021-12-16 NOTE — Telephone Encounter (Signed)
Called pt to report message from Dr. Lorenso Courier below.  ?

## 2021-12-17 DIAGNOSIS — R040 Epistaxis: Secondary | ICD-10-CM | POA: Diagnosis not present

## 2021-12-17 DIAGNOSIS — H903 Sensorineural hearing loss, bilateral: Secondary | ICD-10-CM | POA: Diagnosis not present

## 2021-12-17 DIAGNOSIS — H9311 Tinnitus, right ear: Secondary | ICD-10-CM | POA: Diagnosis not present

## 2021-12-19 ENCOUNTER — Other Ambulatory Visit: Payer: Self-pay | Admitting: Internal Medicine

## 2021-12-19 DIAGNOSIS — F331 Major depressive disorder, recurrent, moderate: Secondary | ICD-10-CM

## 2021-12-20 ENCOUNTER — Other Ambulatory Visit: Payer: Self-pay | Admitting: Physician Assistant

## 2021-12-20 ENCOUNTER — Other Ambulatory Visit: Payer: Self-pay | Admitting: Internal Medicine

## 2021-12-20 ENCOUNTER — Encounter: Payer: Self-pay | Admitting: Internal Medicine

## 2021-12-20 DIAGNOSIS — F332 Major depressive disorder, recurrent severe without psychotic features: Secondary | ICD-10-CM

## 2021-12-20 MED ORDER — VORTIOXETINE HBR 10 MG PO TABS: 10.0000 mg | ORAL_TABLET | Freq: Every day | ORAL | 0 refills | Status: DC

## 2021-12-20 MED ORDER — ONDANSETRON HCL 4 MG PO TABS: 4.0000 mg | ORAL_TABLET | Freq: Three times a day (TID) | ORAL | 0 refills | Status: DC | PRN

## 2021-12-20 MED ORDER — ASPIRIN EC 81 MG PO TBEC: 81.0000 mg | DELAYED_RELEASE_TABLET | Freq: Two times a day (BID) | ORAL | 0 refills | Status: DC

## 2021-12-20 MED ORDER — OXYCODONE-ACETAMINOPHEN 5-325 MG PO TABS: 1.0000 | ORAL_TABLET | Freq: Four times a day (QID) | ORAL | 0 refills | Status: DC | PRN

## 2021-12-20 MED ORDER — METHOCARBAMOL 500 MG PO TABS
500.0000 mg | ORAL_TABLET | Freq: Two times a day (BID) | ORAL | 0 refills | Status: DC | PRN
Start: 2021-12-20 — End: 2022-02-28

## 2021-12-20 MED ORDER — TRANEXAMIC ACID 1000 MG/10ML IV SOLN
2000.0000 mg | INTRAVENOUS | Status: DC
  Filled 2021-12-20: qty 20

## 2021-12-20 MED ORDER — DOCUSATE SODIUM 100 MG PO CAPS: 100.0000 mg | ORAL_CAPSULE | Freq: Every day | ORAL | 2 refills | Status: DC | PRN

## 2021-12-21 ENCOUNTER — Ambulatory Visit (HOSPITAL_COMMUNITY): Payer: BC Managed Care – PPO

## 2021-12-21 ENCOUNTER — Other Ambulatory Visit: Payer: Self-pay

## 2021-12-21 ENCOUNTER — Encounter (HOSPITAL_COMMUNITY): Payer: Self-pay | Admitting: Orthopaedic Surgery

## 2021-12-21 ENCOUNTER — Observation Stay (HOSPITAL_COMMUNITY)
Admission: RE | Admit: 2021-12-21 | Discharge: 2021-12-22 | Disposition: A | Discharge status: Home Health | Payer: BC Managed Care – PPO | Attending: Orthopaedic Surgery | Admitting: Orthopaedic Surgery

## 2021-12-21 ENCOUNTER — Encounter (HOSPITAL_COMMUNITY)
Admission: RE | Disposition: A | Discharge status: Home Health | Payer: Self-pay | Source: Home / Self Care | Attending: Orthopaedic Surgery

## 2021-12-21 ENCOUNTER — Ambulatory Visit (HOSPITAL_COMMUNITY): Payer: BC Managed Care – PPO | Admitting: Vascular Surgery

## 2021-12-21 ENCOUNTER — Ambulatory Visit (HOSPITAL_COMMUNITY): Payer: BC Managed Care – PPO | Admitting: Certified Registered Nurse Anesthetist

## 2021-12-21 ENCOUNTER — Observation Stay (HOSPITAL_COMMUNITY): Payer: BC Managed Care – PPO

## 2021-12-21 DIAGNOSIS — Z87891 Personal history of nicotine dependence: Secondary | ICD-10-CM | POA: Insufficient documentation

## 2021-12-21 DIAGNOSIS — Z96641 Presence of right artificial hip joint: Secondary | ICD-10-CM

## 2021-12-21 DIAGNOSIS — Z7982 Long term (current) use of aspirin: Secondary | ICD-10-CM | POA: Insufficient documentation

## 2021-12-21 DIAGNOSIS — Z85841 Personal history of malignant neoplasm of brain: Secondary | ICD-10-CM | POA: Insufficient documentation

## 2021-12-21 DIAGNOSIS — M87051 Idiopathic aseptic necrosis of right femur: Secondary | ICD-10-CM | POA: Diagnosis not present

## 2021-12-21 DIAGNOSIS — Z853 Personal history of malignant neoplasm of breast: Secondary | ICD-10-CM | POA: Diagnosis not present

## 2021-12-21 DIAGNOSIS — I10 Essential (primary) hypertension: Secondary | ICD-10-CM | POA: Insufficient documentation

## 2021-12-21 DIAGNOSIS — Z471 Aftercare following joint replacement surgery: Secondary | ICD-10-CM | POA: Diagnosis not present

## 2021-12-21 DIAGNOSIS — Z79899 Other long term (current) drug therapy: Secondary | ICD-10-CM | POA: Insufficient documentation

## 2021-12-21 DIAGNOSIS — M879 Osteonecrosis, unspecified: Secondary | ICD-10-CM | POA: Diagnosis not present

## 2021-12-21 HISTORY — PX: TOTAL HIP ARTHROPLASTY: SHX124

## 2021-12-21 SURGERY — ARTHROPLASTY, HIP, TOTAL, ANTERIOR APPROACH
Anesthesia: Spinal | Site: Hip | Laterality: Right

## 2021-12-21 MED ORDER — HYDROMORPHONE HCL 1 MG/ML IJ SOLN: 0.2500 mg | INTRAMUSCULAR | Status: DC | PRN

## 2021-12-21 MED ORDER — MIDAZOLAM HCL 2 MG/2ML IJ SOLN
INTRAMUSCULAR | Status: DC | PRN
  Administered 2021-12-21: 2 mg via INTRAVENOUS

## 2021-12-21 MED ORDER — MENTHOL 3 MG MT LOZG: 1.0000 | LOZENGE | OROMUCOSAL | Status: DC | PRN

## 2021-12-21 MED ORDER — VORTIOXETINE HBR 5 MG PO TABS
10.0000 mg | ORAL_TABLET | Freq: Every day | ORAL | Status: DC
  Administered 2021-12-21 – 2021-12-22 (×2): 10 mg via ORAL
  Filled 2021-12-21 (×2): qty 2

## 2021-12-21 MED ORDER — ASPIRIN 81 MG PO CHEW
81.0000 mg | CHEWABLE_TABLET | Freq: Two times a day (BID) | ORAL | Status: DC
  Administered 2021-12-21: 81 mg via ORAL
  Filled 2021-12-21: qty 1

## 2021-12-21 MED ORDER — FERROUS SULFATE 325 (65 FE) MG PO TABS: 325.0000 mg | ORAL_TABLET | Freq: Three times a day (TID) | ORAL | Status: DC

## 2021-12-21 MED ORDER — FENTANYL CITRATE (PF) 250 MCG/5ML IJ SOLN
INTRAMUSCULAR | Status: AC
  Filled 2021-12-21: qty 5

## 2021-12-21 MED ORDER — CLINDAMYCIN PHOSPHATE 900 MG/50ML IV SOLN
900.0000 mg | INTRAVENOUS | Status: AC
  Administered 2021-12-21: 900 mg via INTRAVENOUS

## 2021-12-21 MED ORDER — ONDANSETRON HCL 4 MG/2ML IJ SOLN: 4.0000 mg | Freq: Once | INTRAMUSCULAR | Status: DC | PRN

## 2021-12-21 MED ORDER — CEFAZOLIN SODIUM-DEXTROSE 2-3 GM-%(50ML) IV SOLR
INTRAVENOUS | Status: DC | PRN
  Administered 2021-12-21: 2 g via INTRAVENOUS

## 2021-12-21 MED ORDER — ONDANSETRON HCL 4 MG/2ML IJ SOLN
INTRAMUSCULAR | Status: DC | PRN
Start: 2021-12-21 — End: 2021-12-21
  Administered 2021-12-21: 4 mg via INTRAVENOUS

## 2021-12-21 MED ORDER — PRONTOSAN WOUND IRRIGATION OPTIME
TOPICAL | Status: DC | PRN
  Administered 2021-12-21: 1

## 2021-12-21 MED ORDER — OXYCODONE HCL 5 MG/5ML PO SOLN: 5.0000 mg | Freq: Once | ORAL | Status: AC | PRN

## 2021-12-21 MED ORDER — PANTOPRAZOLE SODIUM 40 MG PO TBEC
40.0000 mg | DELAYED_RELEASE_TABLET | Freq: Every day | ORAL | Status: DC
  Administered 2021-12-21 – 2021-12-22 (×2): 40 mg via ORAL
  Filled 2021-12-21 (×2): qty 1

## 2021-12-21 MED ORDER — METHOCARBAMOL 500 MG PO TABS
500.0000 mg | ORAL_TABLET | Freq: Four times a day (QID) | ORAL | Status: DC | PRN
  Administered 2021-12-21 – 2021-12-22 (×3): 500 mg via ORAL
  Filled 2021-12-21 (×3): qty 1

## 2021-12-21 MED ORDER — SORBITOL 70 % SOLN: 30.0000 mL | Freq: Every day | Status: DC | PRN

## 2021-12-21 MED ORDER — PHENOL 1.4 % MT LIQD: 1.0000 | OROMUCOSAL | Status: DC | PRN

## 2021-12-21 MED ORDER — SODIUM CHLORIDE 0.9 % IR SOLN
Status: DC | PRN
  Administered 2021-12-21: 1000 mL

## 2021-12-21 MED ORDER — LACTATED RINGERS IV SOLN: INTRAVENOUS | Status: DC

## 2021-12-21 MED ORDER — HYDROMORPHONE HCL 1 MG/ML IJ SOLN
0.5000 mg | INTRAMUSCULAR | Status: DC | PRN
  Administered 2021-12-21: 1 mg via INTRAVENOUS
  Filled 2021-12-21: qty 1

## 2021-12-21 MED ORDER — METOCLOPRAMIDE HCL 5 MG/ML IJ SOLN: 5.0000 mg | Freq: Three times a day (TID) | INTRAMUSCULAR | Status: DC | PRN

## 2021-12-21 MED ORDER — CHLORHEXIDINE GLUCONATE 0.12 % MT SOLN
OROMUCOSAL | Status: AC
  Administered 2021-12-21: 15 mL via OROMUCOSAL
  Filled 2021-12-21: qty 15

## 2021-12-21 MED ORDER — DOCUSATE SODIUM 100 MG PO CAPS
100.0000 mg | ORAL_CAPSULE | Freq: Two times a day (BID) | ORAL | Status: DC
  Administered 2021-12-21 – 2021-12-22 (×3): 100 mg via ORAL
  Filled 2021-12-21 (×3): qty 1

## 2021-12-21 MED ORDER — PHENYLEPHRINE 80 MCG/ML (10ML) SYRINGE FOR IV PUSH (FOR BLOOD PRESSURE SUPPORT)
PREFILLED_SYRINGE | INTRAVENOUS | Status: AC
  Filled 2021-12-21: qty 10

## 2021-12-21 MED ORDER — VANCOMYCIN HCL 1000 MG IV SOLR
INTRAVENOUS | Status: AC
  Filled 2021-12-21: qty 20

## 2021-12-21 MED ORDER — BUPIVACAINE-MELOXICAM ER 400-12 MG/14ML IJ SOLN
INTRAMUSCULAR | Status: AC
  Filled 2021-12-21: qty 1

## 2021-12-21 MED ORDER — SODIUM CHLORIDE 0.9 % IV SOLN: INTRAVENOUS | Status: DC

## 2021-12-21 MED ORDER — CLINDAMYCIN PHOSPHATE 900 MG/50ML IV SOLN
INTRAVENOUS | Status: AC
  Filled 2021-12-21: qty 50

## 2021-12-21 MED ORDER — OXYCODONE HCL 5 MG PO TABS
5.0000 mg | ORAL_TABLET | Freq: Once | ORAL | Status: AC | PRN
  Administered 2021-12-21: 5 mg via ORAL

## 2021-12-21 MED ORDER — DIPHENHYDRAMINE HCL 12.5 MG/5ML PO ELIX: 25.0000 mg | ORAL_SOLUTION | ORAL | Status: DC | PRN

## 2021-12-21 MED ORDER — CHLORHEXIDINE GLUCONATE 0.12 % MT SOLN: 15.0000 mL | Freq: Once | OROMUCOSAL | Status: AC

## 2021-12-21 MED ORDER — EPHEDRINE SULFATE-NACL 50-0.9 MG/10ML-% IV SOSY
PREFILLED_SYRINGE | INTRAVENOUS | Status: DC | PRN
  Administered 2021-12-21 (×2): 5 mg via INTRAVENOUS

## 2021-12-21 MED ORDER — OXYCODONE HCL 5 MG PO TABS
10.0000 mg | ORAL_TABLET | ORAL | Status: DC | PRN
  Administered 2021-12-21: 15 mg via ORAL
  Filled 2021-12-21: qty 3

## 2021-12-21 MED ORDER — METOCLOPRAMIDE HCL 5 MG PO TABS: 5.0000 mg | ORAL_TABLET | Freq: Three times a day (TID) | ORAL | Status: DC | PRN

## 2021-12-21 MED ORDER — TRANEXAMIC ACID-NACL 1000-0.7 MG/100ML-% IV SOLN
INTRAVENOUS | Status: AC
  Filled 2021-12-21: qty 100

## 2021-12-21 MED ORDER — ONDANSETRON HCL 4 MG PO TABS: 4.0000 mg | ORAL_TABLET | Freq: Four times a day (QID) | ORAL | Status: DC | PRN

## 2021-12-21 MED ORDER — CEFAZOLIN SODIUM-DEXTROSE 2-4 GM/100ML-% IV SOLN
2.0000 g | Freq: Four times a day (QID) | INTRAVENOUS | Status: AC
  Administered 2021-12-21 (×2): 2 g via INTRAVENOUS
  Filled 2021-12-21 (×2): qty 100

## 2021-12-21 MED ORDER — SODIUM CHLORIDE 0.9 % IV SOLN
INTRAVENOUS | Status: DC | PRN
  Administered 2021-12-21: 2000 mg via TOPICAL

## 2021-12-21 MED ORDER — ONDANSETRON HCL 4 MG/2ML IJ SOLN: 4.0000 mg | Freq: Four times a day (QID) | INTRAMUSCULAR | Status: DC | PRN

## 2021-12-21 MED ORDER — FENTANYL CITRATE (PF) 250 MCG/5ML IJ SOLN
INTRAMUSCULAR | Status: DC | PRN
  Administered 2021-12-21: 25 ug via INTRAVENOUS

## 2021-12-21 MED ORDER — OXYCODONE HCL ER 10 MG PO T12A
10.0000 mg | EXTENDED_RELEASE_TABLET | Freq: Two times a day (BID) | ORAL | Status: DC
  Administered 2021-12-21 – 2021-12-22 (×3): 10 mg via ORAL
  Filled 2021-12-21 (×3): qty 1

## 2021-12-21 MED ORDER — PROPOFOL 500 MG/50ML IV EMUL
INTRAVENOUS | Status: DC | PRN
  Administered 2021-12-21: 50 ug/kg/min via INTRAVENOUS

## 2021-12-21 MED ORDER — THIAMINE HCL 100 MG PO TABS
100.0000 mg | ORAL_TABLET | Freq: Every day | ORAL | Status: DC
  Administered 2021-12-21 – 2021-12-22 (×2): 100 mg via ORAL
  Filled 2021-12-21 (×2): qty 1

## 2021-12-21 MED ORDER — MIDAZOLAM HCL 2 MG/2ML IJ SOLN
INTRAMUSCULAR | Status: AC
  Filled 2021-12-21: qty 2

## 2021-12-21 MED ORDER — TRANEXAMIC ACID-NACL 1000-0.7 MG/100ML-% IV SOLN
1000.0000 mg | Freq: Once | INTRAVENOUS | Status: AC
  Administered 2021-12-21: 1000 mg via INTRAVENOUS
  Filled 2021-12-21: qty 100

## 2021-12-21 MED ORDER — OXYCODONE HCL 5 MG PO TABS
5.0000 mg | ORAL_TABLET | ORAL | Status: DC | PRN
  Administered 2021-12-22: 10 mg via ORAL
  Filled 2021-12-21 (×2): qty 2

## 2021-12-21 MED ORDER — PROPOFOL 10 MG/ML IV BOLUS
INTRAVENOUS | Status: DC | PRN
  Administered 2021-12-21: 30 mg via INTRAVENOUS
  Administered 2021-12-21 (×2): 10 mg via INTRAVENOUS
  Administered 2021-12-21: 20 mg via INTRAVENOUS

## 2021-12-21 MED ORDER — METHOCARBAMOL 1000 MG/10ML IJ SOLN
500.0000 mg | Freq: Four times a day (QID) | INTRAVENOUS | Status: DC | PRN
  Filled 2021-12-21: qty 5

## 2021-12-21 MED ORDER — OXYCODONE HCL 5 MG PO TABS
ORAL_TABLET | ORAL | Status: AC
  Filled 2021-12-21: qty 1

## 2021-12-21 MED ORDER — POVIDONE-IODINE 10 % EX SWAB: 2.0000 "application " | Freq: Once | CUTANEOUS | Status: DC

## 2021-12-21 MED ORDER — ACETAMINOPHEN 325 MG PO TABS: 325.0000 mg | ORAL_TABLET | Freq: Four times a day (QID) | ORAL | Status: DC | PRN

## 2021-12-21 MED ORDER — BUPIVACAINE-MELOXICAM ER 400-12 MG/14ML IJ SOLN
INTRAMUSCULAR | Status: DC | PRN
Start: 2021-12-21 — End: 2021-12-21
  Administered 2021-12-21: 400 mg

## 2021-12-21 MED ORDER — ACETAMINOPHEN 500 MG PO TABS
1000.0000 mg | ORAL_TABLET | Freq: Four times a day (QID) | ORAL | Status: AC
  Administered 2021-12-21 – 2021-12-22 (×4): 1000 mg via ORAL
  Filled 2021-12-21 (×4): qty 2

## 2021-12-21 MED ORDER — VANCOMYCIN HCL 1 G IV SOLR
INTRAVENOUS | Status: DC | PRN
Start: 2021-12-21 — End: 2021-12-21
  Administered 2021-12-21: 1000 mg via TOPICAL

## 2021-12-21 MED ORDER — TRANEXAMIC ACID-NACL 1000-0.7 MG/100ML-% IV SOLN
1000.0000 mg | INTRAVENOUS | Status: AC
  Administered 2021-12-21: 1000 mg via INTRAVENOUS

## 2021-12-21 MED ORDER — ORAL CARE MOUTH RINSE: 15.0000 mL | Freq: Once | OROMUCOSAL | Status: AC

## 2021-12-21 MED ORDER — ONDANSETRON HCL 4 MG/2ML IJ SOLN
INTRAMUSCULAR | Status: AC
  Filled 2021-12-21: qty 2

## 2021-12-21 MED ORDER — BUPIVACAINE IN DEXTROSE 0.75-8.25 % IT SOLN
INTRATHECAL | Status: DC | PRN
  Administered 2021-12-21: 2 mL via INTRATHECAL

## 2021-12-21 MED ORDER — ALUM & MAG HYDROXIDE-SIMETH 200-200-20 MG/5ML PO SUSP: 30.0000 mL | ORAL | Status: DC | PRN

## 2021-12-21 MED ORDER — DEXAMETHASONE SODIUM PHOSPHATE 10 MG/ML IJ SOLN
10.0000 mg | Freq: Once | INTRAMUSCULAR | Status: AC
  Administered 2021-12-22: 10 mg via INTRAVENOUS
  Filled 2021-12-21: qty 1

## 2021-12-21 MED ORDER — POLYETHYLENE GLYCOL 3350 17 G PO PACK
17.0000 g | PACK | Freq: Every day | ORAL | Status: DC
  Administered 2021-12-21 – 2021-12-22 (×2): 17 g via ORAL
  Filled 2021-12-21: qty 1

## 2021-12-21 MED ORDER — OXCARBAZEPINE 300 MG PO TABS
300.0000 mg | ORAL_TABLET | Freq: Two times a day (BID) | ORAL | Status: DC
  Administered 2021-12-21 – 2021-12-22 (×3): 300 mg via ORAL
  Filled 2021-12-21 (×5): qty 1

## 2021-12-21 MED ORDER — 0.9 % SODIUM CHLORIDE (POUR BTL) OPTIME
TOPICAL | Status: DC | PRN
  Administered 2021-12-21: 1000 mL

## 2021-12-21 SURGICAL SUPPLY — 68 items
ACETAB CUP W/GRIPTION 54 (Plate) ×2 IMPLANT
BAG COUNTER SPONGE SURGICOUNT (BAG) ×2 IMPLANT
BAG DECANTER FOR FLEXI CONT (MISCELLANEOUS) ×2 IMPLANT
CELLS DAT CNTRL 66122 CELL SVR (MISCELLANEOUS) IMPLANT
CLSR STERI-STRIP ANTIMIC 1/2X4 (GAUZE/BANDAGES/DRESSINGS) ×1 IMPLANT
COOLER ICEMAN CLASSIC (MISCELLANEOUS) ×1 IMPLANT
COVER PERINEAL POST (MISCELLANEOUS) ×2 IMPLANT
COVER SURGICAL LIGHT HANDLE (MISCELLANEOUS) ×2 IMPLANT
CUP ACETAB W/GRIPTION 54 (Plate) IMPLANT
DRAPE C-ARM 42X72 X-RAY (DRAPES) ×2 IMPLANT
DRAPE POUCH INSTRU U-SHP 10X18 (DRAPES) ×2 IMPLANT
DRAPE STERI IOBAN 125X83 (DRAPES) ×2 IMPLANT
DRAPE U-SHAPE 47X51 STRL (DRAPES) ×4 IMPLANT
DRSG AQUACEL AG ADV 3.5X10 (GAUZE/BANDAGES/DRESSINGS) ×2 IMPLANT
DURAPREP 26ML APPLICATOR (WOUND CARE) ×4 IMPLANT
ELECT BLADE 4.0 EZ CLEAN MEGAD (MISCELLANEOUS) ×2
ELECT REM PT RETURN 9FT ADLT (ELECTROSURGICAL) ×2
ELECTRODE BLDE 4.0 EZ CLN MEGD (MISCELLANEOUS) ×1 IMPLANT
ELECTRODE REM PT RTRN 9FT ADLT (ELECTROSURGICAL) ×1 IMPLANT
GLOVE BIOGEL PI IND STRL 7.0 (GLOVE) ×1 IMPLANT
GLOVE BIOGEL PI IND STRL 7.5 (GLOVE) ×4 IMPLANT
GLOVE BIOGEL PI INDICATOR 7.0 (GLOVE) ×1
GLOVE BIOGEL PI INDICATOR 7.5 (GLOVE) ×4
GLOVE ECLIPSE 7.0 STRL STRAW (GLOVE) ×4 IMPLANT
GLOVE SKINSENSE NS SZ7.5 (GLOVE) ×1
GLOVE SKINSENSE STRL SZ7.5 (GLOVE) ×1 IMPLANT
GLOVE SURG UNDER POLY LF SZ7 (GLOVE) ×2 IMPLANT
GLOVE SURG UNDER POLY LF SZ7.5 (GLOVE) ×4 IMPLANT
GOWN STRL REIN XL XLG (GOWN DISPOSABLE) ×3 IMPLANT
GOWN STRL REUS W/ TWL LRG LVL3 (GOWN DISPOSABLE) IMPLANT
GOWN STRL REUS W/ TWL XL LVL3 (GOWN DISPOSABLE) ×1 IMPLANT
GOWN STRL REUS W/TWL LRG LVL3 (GOWN DISPOSABLE) ×2
GOWN STRL REUS W/TWL XL LVL3 (GOWN DISPOSABLE) ×2
HANDPIECE INTERPULSE COAX TIP (DISPOSABLE) ×2
HEAD CERAMIC 36 PLUS5 (Hips) ×1 IMPLANT
HOOD PEEL AWAY FLYTE STAYCOOL (MISCELLANEOUS) ×5 IMPLANT
IV NS IRRIG 3000ML ARTHROMATIC (IV SOLUTION) ×2 IMPLANT
JET LAVAGE IRRISEPT WOUND (IRRIGATION / IRRIGATOR)
KIT BASIN OR (CUSTOM PROCEDURE TRAY) ×2 IMPLANT
LAVAGE JET IRRISEPT WOUND (IRRIGATION / IRRIGATOR) ×1 IMPLANT
LINER NEUTRAL 54X36MM PLUS 4 (Hips) ×1 IMPLANT
MARKER SKIN DUAL TIP RULER LAB (MISCELLANEOUS) ×2 IMPLANT
NDL SPNL 18GX3.5 QUINCKE PK (NEEDLE) ×1 IMPLANT
NEEDLE SPNL 18GX3.5 QUINCKE PK (NEEDLE) ×2 IMPLANT
PACK TOTAL JOINT (CUSTOM PROCEDURE TRAY) ×2 IMPLANT
PACK UNIVERSAL I (CUSTOM PROCEDURE TRAY) ×2 IMPLANT
PAD COLD SHLDR WRAP-ON (PAD) ×1 IMPLANT
RETRACTOR WND ALEXIS 18 MED (MISCELLANEOUS) IMPLANT
RTRCTR WOUND ALEXIS 18CM MED (MISCELLANEOUS)
SAW OSC TIP CART 19.5X105X1.3 (SAW) ×2 IMPLANT
SET HNDPC FAN SPRY TIP SCT (DISPOSABLE) ×1 IMPLANT
SOLUTION PRONTOSAN WOUND 350ML (IRRIGATION / IRRIGATOR) ×1 IMPLANT
STAPLER VISISTAT 35W (STAPLE) IMPLANT
STEM FEM ACTIS HIGH SZ7 (Stem) ×1 IMPLANT
SUT ETHIBOND 2 V 37 (SUTURE) ×2 IMPLANT
SUT MNCRL AB 3-0 PS2 27 (SUTURE) ×1 IMPLANT
SUT VIC AB 0 CT1 27 (SUTURE) ×2
SUT VIC AB 0 CT1 27XBRD ANBCTR (SUTURE) ×1 IMPLANT
SUT VIC AB 1 CTX 36 (SUTURE) ×2
SUT VIC AB 1 CTX36XBRD ANBCTR (SUTURE) ×1 IMPLANT
SUT VIC AB 2-0 CT1 27 (SUTURE) ×6
SUT VIC AB 2-0 CT1 TAPERPNT 27 (SUTURE) ×2 IMPLANT
SYR 50ML LL SCALE MARK (SYRINGE) ×2 IMPLANT
TOWEL GREEN STERILE (TOWEL DISPOSABLE) ×2 IMPLANT
TRAY CATH 16FR W/PLASTIC CATH (SET/KITS/TRAYS/PACK) IMPLANT
TRAY FOLEY W/BAG SLVR 16FR (SET/KITS/TRAYS/PACK) ×2
TRAY FOLEY W/BAG SLVR 16FR ST (SET/KITS/TRAYS/PACK) ×1 IMPLANT
YANKAUER SUCT BULB TIP NO VENT (SUCTIONS) ×2 IMPLANT

## 2021-12-21 NOTE — Discharge Instructions (Signed)

## 2021-12-21 NOTE — H&P (Signed)
H&P update ? ?The surgical history has been reviewed and remains accurate without interval change.  The patient was re-examined and patient's physiologic condition has not changed significantly in the last 30 days. The condition still exists that makes this procedure necessary. The treatment plan remains the same, without new options for care.  No new pharmacological allergies or types of therapy has been initiated that would change the plan or the appropriateness of the plan.  The patient and/or family understand the potential benefits and risks. ? ?12/21/2021 ?8:41 AM ? ? ? ?

## 2021-12-21 NOTE — H&P (Signed)
? ? ?PREOPERATIVE H&P ? ?Chief Complaint: right hip avascular necrosis ? ?HPI: ?Shawn York is a 46 y.o. male who presents for surgical treatment of right hip avascular necrosis.  He denies any changes in medical history. ? ?Past Medical History:  ?Diagnosis Date  ? Anemia   ? Avascular necrosis ([place])   ? 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 grade III anaplastic ologodendrglioma  ? Depression   ? Epistaxis   ? Headache   ? Hypertension   ? PTSD (post-traumatic stress disorder)   ? Seizures ([place])   ? ?Past Surgical History:  ?Procedure Laterality Date  ? BASAL CELL CARCINOMA EXCISION  1995  ? CRANIOTOMY FOR TUMOR Right 02/25/2011  ? IR ANGIO EXTERNAL CAROTID SEL EXT CAROTID BILAT MOD SED  11/16/2021  ? IR ANGIO INTRA EXTRACRAN SEL COM CAROTID INNOMINATE UNI R MOD SED  11/16/2021  ? IR ANGIO INTRA EXTRACRAN SEL INTERNAL CAROTID UNI L MOD SED  11/16/2021  ? IR ANGIO VERTEBRAL SEL VERTEBRAL UNI R MOD SED  11/16/2021  ? IR ANGIOGRAM FOLLOW UP STUDY  11/17/2021  ? IR NEURO EACH ADD'L AFTER BASIC UNI LEFT (MS)  11/17/2021  ? IR NEURO EACH ADD'L AFTER BASIC UNI RIGHT (MS)  11/17/2021  ? IR TRANSCATH/EMBOLIZ  11/16/2021  ? IR US GUIDE VASC ACCESS RIGHT  11/16/2021  ? RADIOLOGY WITH ANESTHESIA N/A 11/16/2021  ? Procedure: IR WITH ANESTHESIA;  Surgeon: Pedro Earls, MD;  Location: [facility];  Service: Radiology;  Laterality: N/A;  ? WISDOM TOOTH EXTRACTION    ? ?Social History  ? ?Socioeconomic History  ? Marital status: Married  ?  Spouse name: Not on file  ? Number of children: Not on file  ? Years of education: Not on file  ? Highest education level: Not on file  ?Occupational History  ? Not on file  ?Tobacco Use  ? Smoking status: Former  ?  Types: Cigarettes  ?  Quit date: 02/25/2011  ?  Years since quitting: 10.8  ? Smokeless tobacco: Never  ?Vaping Use  ? Vaping Use: Never used  ?Substance and Sexual Activity  ? Alcohol use: Not Currently  ?  Alcohol/week: 14.0 standard drinks  ?  Types: 14 Cans of beer per  week  ?  Comment: quit 11-08-2021  ? Drug use: No  ? Sexual activity: Yes  ?  Comment: E-cigarette users  ?Other Topics Concern  ? Not on file  ?Social History Narrative  ? Not on file  ? ?Social Determinants of Health  ? ?Financial Resource Strain: Not on file  ?Food Insecurity: Not on file  ?Transportation Needs: Not on file  ?Physical Activity: Not on file  ?Stress: Not on file  ?Social Connections: Not on file  ? ?Family History  ?Problem Relation Age of Onset  ? Cancer Other   ?     brain cancer  ? Cancer Father   ?     skin cancer  ? Cancer Maternal Grandmother 40  ?     brain cancer  ? Alcohol abuse Brother   ? Alzheimer's disease Paternal Grandmother   ? Dementia Paternal Grandmother   ? Heart disease Paternal Grandfather   ? ?Allergies  ?Allergen Reactions  ? Iodinated Contrast Media Rash and Anaphylaxis  ? Penicillins Shortness Of Breath and Rash  ?  Did it involve swelling of the face/tongue/throat, SOB, or low BP? Yes ?Did it involve sudden or severe rash/hives, skin peeling, or any reaction on the inside of your  mouth or nose? No ?Did you need to seek medical attention at a hospital or doctor's office? No ?When did it last happen? Unk ?If all above answers are ?NO?, may proceed with cephalosporin use. ? ?  ? Shellfish Allergy Anaphylaxis and Rash  ? Shellfish-Derived Products Anaphylaxis and Rash  ? ?Prior to Admission medications   ?Medication Sig Start Date End Date Taking? Authorizing Provider  ?aspirin EC 81 MG tablet Take 1 tablet (81 mg total) by mouth 2 (two) times daily. To be taken after surgery to prevent blood clots 12/20/21   Aundra Dubin, PA-C  ?docusate sodium (COLACE) 100 MG capsule Take 1 capsule (100 mg total) by mouth daily as needed. 12/20/21 12/20/22  Aundra Dubin, PA-C  ?methocarbamol (ROBAXIN) 500 MG tablet Take 1 tablet (500 mg total) by mouth 2 (two) times daily as needed. 12/20/21   Aundra Dubin, PA-C  ?ondansetron (ZOFRAN) 4 MG tablet Take 1 tablet (4 mg total) by mouth  every 8 (eight) hours as needed for nausea or vomiting. 12/20/21   Aundra Dubin, PA-C  ?oxyCODONE-acetaminophen (PERCOCET) 5-325 MG tablet Take 1-2 tablets by mouth every 6 (six) hours as needed. 12/20/21   Aundra Dubin, PA-C  ?acetaminophen (TYLENOL) 500 MG tablet Take 1,000 mg by mouth every 6 (six) hours as needed for moderate pain or headache.    [provider]  ?busPIRone (BUSPAR) 30 MG tablet Take 1 tablet (30 mg total) by mouth 2 (two) times daily. 04/29/21 11/15/21  Thayer Headings, [place]  ?EPINEPHrine 0.3 mg/0.3 mL IJ SOAJ injection Inject 0.3 mg into the muscle once as needed (for a severe, allergic reaction).     [provider]  ?nebivolol (BYSTOLIC) 5 MG tablet Take 1 tablet by mouth daily. 11/22/21   [provider]  ?Oxcarbazepine (TRILEPTAL) 300 MG tablet Take 1 tablet (300 mg total) by mouth 2 (two) times daily. 08/21/16   Derrill Center, NP  ?thiamine 100 MG tablet Take 1 tablet (100 mg total) by mouth daily. 12/01/21   Janith Lima, MD  ?vortioxetine HBr (TRINTELLIX) 10 MG TABS tablet Take 1 tablet (10 mg total) by mouth daily. 12/20/21   Janith Lima, MD  ? ? ? ?Positive ROS: All other systems have been reviewed and were otherwise negative with the exception of those mentioned in the HPI and as above. ? ?Physical Exam: ?General: Alert, no acute distress ?Cardiovascular: No pedal edema ?Respiratory: No cyanosis, no use of accessory musculature ?GI: abdomen soft ?Skin: No lesions in the area of chief complaint ?Neurologic: Sensation intact distally ?Psychiatric: Patient is competent for consent with normal mood and affect ?Lymphatic: no lymphedema ? ?MUSCULOSKELETAL: exam stable ? ?Assessment: ?right hip avascular necrosis ? ?Plan: ?Plan for Procedure(s): ?RIGHT TOTAL HIP ARTHROPLASTY ANTERIOR APPROACH ? ?The risks benefits and alternatives were discussed with the patient including but not limited to the risks of nonoperative treatment, versus surgical intervention  including infection, bleeding, nerve injury,  blood clots, cardiopulmonary complications, morbidity, mortality, among others, and they were willing to proceed.  ? ?Preoperative templating of the joint replacement has been completed, documented, and submitted to the Operating Room personnel in order to optimize intra-operative equipment management. ? ? ?Eduard Roux, MD ?12/21/2021 ?6:05 AM ? ?

## 2021-12-21 NOTE — Anesthesia Procedure Notes (Signed)
Spinal ? ?Patient location during procedure: OR ?Start time: 12/21/2021 9:50 AM ?End time: 12/21/2021 9:54 AM ?Reason for block: surgical anesthesia ?Staffing ?Performed: anesthesiologist  ?Anesthesiologist: Josephine Igo, MD ?Preanesthetic Checklist ?Completed: patient identified, IV checked, site marked, risks and benefits discussed, surgical consent, monitors and equipment checked, pre-op evaluation and timeout performed ?Spinal Block ?Patient position: sitting ?Prep: DuraPrep and site prepped and draped ?Patient monitoring: heart rate, cardiac monitor, continuous pulse ox and blood pressure ?Approach: midline ?Location: L3-4 ?Injection technique: single-shot ?Needle ?Needle type: Pencan  ?Needle gauge: 24 G ?Needle length: 9 cm ?Needle insertion depth: 7 cm ?Assessment ?Sensory level: T4 ?Events: CSF return ?Additional Notes ?Patient tolerated procedure well. Adequate sensory level. ? ? ? ? ? ?

## 2021-12-21 NOTE — Op Note (Signed)
? ?RIGHT TOTAL HIP REPLACEMENT  Procedure Note ?Kilian Schwartz   [id number] ? ?Pre-op Diagnosis: right hip avascular necrosis ?    ?Post-op Diagnosis: same ?  ?Operative Procedures  ?1. Total hip replacement; Right hip; uncemented [id number]  ? ?Surgeon: Frankey Shown, M.D. ? ?Assist: Madalyn Rob, PA-C ?  ?Anesthesia: spinal ? ?Prosthesis: Depuy ?Acetabulum: Pinnacle 54 mm ?Femur: Actis 7 HO ?Head: 36 mm size: +5 ?Liner: +4 ?Bearing Type: ceramic/poly ? ?Total Hip Arthroplasty (Anterior Approach) Op Note:  ?After informed consent was obtained and the operative extremity marked in the holding area, the patient was brought back to the operating room and placed supine on the HANA table. Next, the operative extremity was prepped and draped in normal sterile fashion. Surgical timeout occurred verifying patient identification, surgical site, surgical procedure and administration of antibiotics.  ?A modified anterior Smith-Peterson approach to the hip was performed, using the interval between tensor fascia lata and sartorius.  Dissection was carried bluntly down onto the anterior hip capsule. The lateral femoral circumflex vessels were identified and coagulated. A capsulotomy was performed and the capsular flaps tagged for later repair.  The neck osteotomy was performed. The femoral head was removed, the acetabular rim was cleared of soft tissue and attention was turned to reaming the acetabulum.  ?Sequential reaming was performed under fluoroscopic guidance. We reamed to a size 53 mm, and then impacted the acetabular shell.  The liner was then placed after irrigation and attention turned to the femur.  ?After placing the femoral hook, the leg was taken to externally rotated, extended and adducted position taking care to perform soft tissue releases to allow for adequate mobilization of the femur. Soft tissue was cleared from the shoulder of the greater trochanter and the hook elevator used to improve exposure of the  proximal femur. Sequential broaching performed up to a size 7. Trial neck and head were placed. The leg was brought back up to neutral and the construct reduced.  Antibiotic irrigation was placed in the surgical wound and kept for at least 1 minute.  The position and sizing of components, offset and leg lengths were checked using fluoroscopy. Stability of the construct was checked in extension and external rotation without any subluxation or impingement of prosthesis. We dislocated the prosthesis, dropped the leg back into position, removed trial components, and irrigated copiously. The final stem and head was then placed, the leg brought back up, the system reduced and fluoroscopy used to verify positioning.  ?We irrigated, obtained hemostasis and closed the capsule using #2 ethibond suture.  One gram of vancomycin powder was placed in the surgical bed.   One gram of topical tranexamic acid was injected into the joint.  The fascia was closed with #1 vicryl plus, the deep fat layer was closed with 0 vicryl, the subcutaneous layers closed with 2.0 Vicryl Plus and the skin closed with 3.0 monocryl and steri strips. A sterile dressing was applied. The patient was awakened in the operating room and taken to recovery in stable condition.  ?All sponge, needle, and instrument counts were correct at the end of the case.  ? ?Tawanna Cooler, my PA, was a medical necessity for opening, closing, limb positioning, retracting, exposing, and overall facilitation and timely completion of the surgery. ? ?Position: supine  ?Complications: see description of procedure.  ?Time Out: performed  ? ?Drains/Packing: none ? ?Estimated blood loss: see anesthesia record ? ?Returned to Recovery Room: in good condition.  ? ?Antibiotics: yes  ? ?Mechanical VTE (  DVT) Prophylaxis: sequential compression devices, TED thigh-high  ?Chemical VTE (DVT) Prophylaxis: aspirin  ? ?Fluid Replacement: see anesthesia record ? ?Specimens Removed: 1 to  pathology  ? ?Sponge and Instrument Count Correct? yes  ? ?PACU: portable radiograph - low AP  ? ?Plan/RTC: Return in 2 weeks for staple removal. ?Weight Bearing/Load Lower Extremity: full  ?Hip precautions: none ?Suture Removal: 2 weeks  ? ?N. Eduard Roux, MD ?Marga Hoots ?11:05 AM ? ? ?Implant Name Type Inv. Item Serial No. Manufacturer Lot No. LRB No. Used Action  ?Enzo Montgomery 54MM - [id number] Plate [place] 54MM  [place] [id number] Right 1 Implanted  ?LINER NEUTRAL 54X36MM PLUS 4 - [id number] Hips LINER NEUTRAL 54X36MM PLUS 4  [employer] [id number] Right 1 Implanted  ?HEAD CERAMIC 36 PLUS5 - [id number] Hips HEAD CERAMIC 36 PLUS5  [employer] [id number] Right 1 Implanted  ?STEM FEM ACTIS HIGH SZ7 - [id number] Stem STEM FEM ACTIS HIGH SZ7  [employer] [id number] Right 1 Implanted  ? ? ?

## 2021-12-21 NOTE — Anesthesia Postprocedure Evaluation (Signed)
Anesthesia Post Note ? ?Patient: Shawn York ? ?Procedure(s) Performed: RIGHT TOTAL HIP REPLACEMENT (Right: Hip) ? ?  ? ?Patient location during evaluation: PACU ?Anesthesia Type: Spinal ?Level of consciousness: oriented and awake and alert ?Pain management: pain level controlled ?Vital Signs Assessment: post-procedure vital signs reviewed and stable ?Respiratory status: spontaneous breathing, respiratory function stable and nonlabored ventilation ?Cardiovascular status: blood pressure returned to baseline and stable ?Postop Assessment: no headache, no backache, no apparent nausea or vomiting, spinal receding and patient able to bend at knees ?Anesthetic complications: no ? ? ?No notable events documented. ? ?Last Vitals:  ?Vitals:  ? 12/21/21 1205 12/21/21 1220  ?BP: 108/75 112/85  ?Pulse: (!) 51 (!) 45  ?Resp: 16 14  ?Temp:    ?SpO2: 100% 100%  ?  ?Last Pain:  ?Vitals:  ? 12/21/21 1205  ?TempSrc:   ?PainSc: 0-No pain  ? ? ?  ?  ?  ?  ?  ?  ? ?Ashelyn Mccravy A. ? ? ? ? ?

## 2021-12-21 NOTE — Transfer of Care (Signed)
Immediate Anesthesia Transfer of Care Note ? ?Patient: Shawn York ? ?Procedure(s) Performed: RIGHT TOTAL HIP REPLACEMENT (Right: Hip) ? ?Patient Location: PACU ? ?Anesthesia Type:MAC and Spinal ? ?Level of Consciousness: awake and oriented ? ?Airway & Oxygen Therapy: Patient Spontanous Breathing ? ?Post-op Assessment: Report given to RN and Post -op Vital signs reviewed and stable ? ?Post vital signs: Reviewed and stable ? ?Last Vitals:  ?Vitals Value Taken Time  ?BP 108/65 12/21/21 1135  ?Temp 36.9 ?C 12/21/21 1135  ?Pulse 62 12/21/21 1136  ?Resp 11 12/21/21 1136  ?SpO2 99 % 12/21/21 1136  ?Vitals shown include unvalidated device data. ? ?Last Pain:  ?Vitals:  ? 12/21/21 0829  ?TempSrc:   ?PainSc: 0-No pain  ?   ? ?  ? ?Complications: No notable events documented. ?

## 2021-12-21 NOTE — Anesthesia Procedure Notes (Signed)
Procedure Name: [place] ?Date/Time: 12/21/2021 10:06 AM ?Performed by: Dorthea Cove, CRNA ?Pre-anesthesia Checklist: Patient identified, Emergency Drugs available, Suction available, Patient being monitored and Timeout performed ?Patient Re-evaluated:Patient Re-evaluated prior to induction ?Oxygen Delivery Method: Simple face mask ?Preoxygenation: Pre-oxygenation with 100% oxygen ?Induction Type: IV induction ?Placement Confirmation: positive ETCO2 and CO2 detector ?Dental Injury: Teeth and Oropharynx as per pre-operative assessment  ? ? ? ? ?

## 2021-12-21 NOTE — Evaluation (Signed)
Physical Therapy Evaluation ?Patient Details ?Name: Shawn York ?MRN: [id number] ?DOB: 26-Jul-1976 ?Today's Date: 12/21/2021 ? ?History of Present Illness ? Pt is 46 yo male s/p R anterior THA on 12/21/21.  Pt with hx of AVN bil hip, brain CA 2012 with craniotomy, PTSD, HTN  ?Clinical Impression ? Pt is s/p THA resulting in the deficits listed below (see PT Problem List). At baseline, pt is independent.  He has borrowed RW and assist at home.  Pt ambulated 150' with RW and min guard , min A transfers.  Pt did have some mild decreased sensation from spinal but able to compensate safely.  Pt expected to progress very well. Pt will benefit from skilled PT to increase their independence and safety with mobility to allow discharge to the venue listed below.  ?   ?   ? ?Recommendations for follow up therapy are one component of a multi-disciplinary discharge planning process, led by the attending physician.  Recommendations may be updated based on patient status, additional functional criteria and insurance authorization. ? ?Follow Up Recommendations Follow physician's recommendations for discharge plan and follow up therapies ? ?  ?Assistance Recommended at Discharge Intermittent Supervision/Assistance  ?Patient can return home with the following ? A little help with walking and/or transfers;A little help with bathing/dressing/bathroom;Help with stairs or ramp for entrance ? ?  ?Equipment Recommendations None recommended by PT  ?Recommendations for Other Services ?    ?  ?Functional Status Assessment Patient has had a recent decline in their functional status and demonstrates the ability to make significant improvements in function in a reasonable and predictable amount of time.  ? ?  ?Precautions / Restrictions Precautions ?Precautions: Fall ?Restrictions ?Weight Bearing Restrictions: Yes ?RLE Weight Bearing: Weight bearing as tolerated  ? ?  ? ?Mobility ? Bed Mobility ?Overal bed mobility: Needs Assistance ?Bed Mobility:  Supine to Sit, Sit to Supine ?  ?  ?Supine to sit: Min assist ?Sit to supine: Min assist ?  ?General bed mobility comments: min A for R LE ?  ? ?Transfers ?Overall transfer level: Needs assistance ?Equipment used: Rolling walker (2 wheels) ?Transfers: Sit to/from Stand ?Sit to Stand: Min guard, From elevated surface ?  ?  ?  ?  ?  ?General transfer comment: cues for hand placement and R LE management ?  ? ?Ambulation/Gait ?Ambulation/Gait assistance: Min guard ?Gait Distance (Feet): 150 Feet ?Assistive device: Rolling walker (2 wheels) ?Gait Pattern/deviations: Step-to pattern, Decreased weight shift to right ?Gait velocity: decreased ?  ?  ?General Gait Details: Cues for sequencing and RW managment ? ?Stairs ?  ?  ?  ?  ?  ? ?Wheelchair Mobility ?  ? ?Modified Rankin (Stroke Patients Only) ?  ? ?  ? ?Balance Overall balance assessment: Needs assistance ?Sitting-balance support: No upper extremity supported ?Sitting balance-Leahy Scale: Good ?  ?  ?Standing balance support: Bilateral upper extremity supported, No upper extremity supported ?Standing balance-Leahy Scale: Fair ?Standing balance comment: RW to ambulate; could static stand no AD ?  ?  ?  ?  ?  ?  ?  ?  ?  ?  ?  ?   ? ? ? ?Pertinent Vitals/Pain Pain Assessment ?Pain Assessment: 0-10 ?Pain Score: 8  ?Pain Location: R hip ?Pain Descriptors / Indicators: Burning, Sore ?Pain Intervention(s): Limited activity within patient's tolerance, Monitored during session, Premedicated before session, Patient requesting pain meds-RN notified, Ice applied  ? ? ?Home Living Family/patient expects to be discharged to:: Private residence ?Living Arrangements: Spouse/significant  other;Children (kids are 3 and 6) ?Available Help at Discharge: Family;Available 24 hours/day ?Type of Home: House ?Home Access: Stairs to enter ?Entrance Stairs-Rails: None ?Entrance Stairs-Number of Steps: 2 ?  ?Home Layout: One level ?Home Equipment: Conservation officer, nature (2 wheels) ?   ?  ?Prior Function  Prior Level of Function : Independent/Modified Independent;Working/employed;Driving ?  ?  ?  ?  ?  ?  ?Mobility Comments: Did not need AD ?  ?  ? ? ?Hand Dominance  ?   ? ?  ?Extremity/Trunk Assessment  ? Upper Extremity Assessment ?Upper Extremity Assessment: Overall WFL for tasks assessed ?  ? ?Lower Extremity Assessment ?Lower Extremity Assessment: RLE deficits/detail;LLE deficits/detail ?RLE Deficits / Details: ROM: WFL; MMT: ankle 5/5, knee and hip 3/5 not further tested ?RLE Sensation: decreased light touch (Reports mild tingling in feet, initially reports sensation in buttock but with ambulation reports numb) ?LLE Deficits / Details: ROM WFL; MMT 5/5 ?LLE Sensation: decreased light touch (Reports mild tingling in feet, initially reports sensation in buttock but with ambulation reports numb) ?  ? ?Cervical / Trunk Assessment ?Cervical / Trunk Assessment: Normal  ?Communication  ? Communication: No difficulties  ?Cognition Arousal/Alertness: Awake/alert ?Behavior During Therapy: [facility] for tasks assessed/performed ?Overall Cognitive Status: Within Functional Limits for tasks assessed ?  ?  ?  ?  ?  ?  ?  ?  ?  ?  ?  ?  ?  ?  ?  ?  ?  ?  ?  ? ?  ?General Comments General comments (skin integrity, edema, etc.): VSS ? ?  ?Exercises    ? ?Assessment/Plan  ?  ?PT Assessment Patient needs continued PT services  ?PT Problem List Decreased strength;Decreased mobility;Decreased safety awareness;Decreased range of motion;Decreased activity tolerance;Decreased balance;Decreased knowledge of use of DME;Pain ? ?   ?  ?PT Treatment Interventions DME instruction;Therapeutic activities;Modalities;Gait training;Therapeutic exercise;Patient/family education;Stair training;Balance training;Functional mobility training   ? ?PT Goals (Current goals can be found in the Care Plan section)  ?Acute Rehab PT Goals ?Patient Stated Goal: return home ?PT Goal Formulation: With patient ?Time For Goal Achievement: 01/04/22 ?Potential to Achieve  Goals: Good ? ?  ?Frequency 7X/week ?  ? ? ?Co-evaluation   ?  ?  ?  ?  ? ? ?  ?AM-PAC PT "6 Clicks" Mobility  ?Outcome Measure Help needed turning from your back to your side while in a flat bed without using bedrails?: None ?Help needed moving from lying on your back to sitting on the side of a flat bed without using bedrails?: A Little ?Help needed moving to and from a bed to a chair (including a wheelchair)?: A Little ?Help needed standing up from a chair using your arms (e.g., wheelchair or bedside chair)?: A Little ?Help needed to walk in hospital room?: A Little ?Help needed climbing 3-5 steps with a railing? : A Little ?6 Click Score: 19 ? ?  ?End of Session Equipment Utilized During Treatment: Gait belt ?Activity Tolerance: Patient tolerated treatment well ?Patient left: in bed;with call bell/phone within reach;with bed alarm set ?Nurse Communication: Mobility status ?PT Visit Diagnosis: Other abnormalities of gait and mobility (R26.89);Muscle weakness (generalized) (M62.81) ?  ? ?Time: 6962-9528 ?PT Time Calculation (min) (ACUTE ONLY): 20 min ? ? ?Charges:   PT Evaluation ?$PT Eval Low Complexity: 1 Low ?  ?  ?   ? ? ?Abran Richard, PT ?Acute Rehab Services ?Pager [phone] ?[facility] [phone] ? ? ?Mikael Spray Srihari Shellhammer ?12/21/2021, 3:55 PM ? ?

## 2021-12-22 DIAGNOSIS — Z87891 Personal history of nicotine dependence: Secondary | ICD-10-CM | POA: Diagnosis not present

## 2021-12-22 DIAGNOSIS — M87051 Idiopathic aseptic necrosis of right femur: Secondary | ICD-10-CM | POA: Diagnosis not present

## 2021-12-22 DIAGNOSIS — I10 Essential (primary) hypertension: Secondary | ICD-10-CM | POA: Diagnosis not present

## 2021-12-22 DIAGNOSIS — Z79899 Other long term (current) drug therapy: Secondary | ICD-10-CM | POA: Diagnosis not present

## 2021-12-22 DIAGNOSIS — Z853 Personal history of malignant neoplasm of breast: Secondary | ICD-10-CM | POA: Diagnosis not present

## 2021-12-22 DIAGNOSIS — Z85841 Personal history of malignant neoplasm of brain: Secondary | ICD-10-CM | POA: Diagnosis not present

## 2021-12-22 DIAGNOSIS — Z7982 Long term (current) use of aspirin: Secondary | ICD-10-CM | POA: Diagnosis not present

## 2021-12-22 LAB — CBC
HCT: 34.9 % — ABNORMAL LOW (ref 39.0–52.0)
Hemoglobin: 11.6 g/dL — ABNORMAL LOW (ref 13.0–17.0)
MCH: 32.7 pg (ref 26.0–34.0)
MCHC: 33.2 g/dL (ref 30.0–36.0)
MCV: 98.3 fL (ref 80.0–100.0)
Platelets: 129 10*3/uL — ABNORMAL LOW (ref 150–400)
RBC: 3.55 MIL/uL — ABNORMAL LOW (ref 4.22–5.81)
RDW: 12.2 % (ref 11.5–15.5)
WBC: 5.2 10*3/uL (ref 4.0–10.5)
nRBC: 0 % (ref 0.0–0.2)

## 2021-12-22 MED ORDER — ENOXAPARIN SODIUM 40 MG/0.4ML IJ SOSY
40.0000 mg | PREFILLED_SYRINGE | INTRAMUSCULAR | Status: DC
  Administered 2021-12-22: 40 mg via SUBCUTANEOUS
  Filled 2021-12-22: qty 0.4

## 2021-12-22 MED ORDER — ENOXAPARIN (LOVENOX) PATIENT EDUCATION KIT
PACK | Freq: Once | Status: AC
  Filled 2021-12-22 (×2): qty 1

## 2021-12-22 MED ORDER — ENOXAPARIN SODIUM 40 MG/0.4ML IJ SOSY: 40.0000 mg | PREFILLED_SYRINGE | INTRAMUSCULAR | 0 refills | Status: DC

## 2021-12-22 NOTE — Telephone Encounter (Addendum)
Key: [id number] ? ?Approved ?Effective from 12/22/2021 through 12/21/2022. ?

## 2021-12-22 NOTE — Progress Notes (Signed)
Patient alert and oriented, mae's well, voiding adequate amount of urine, swallowing without difficulty, no c/o pain at time of discharge. Patient discharged home with family. Script and discharged instructions given to patient. Patient and family stated understanding of instructions given. Patient has an appointment with Dr. Xu 

## 2021-12-22 NOTE — Plan of Care (Signed)
?  Problem: Education: ?Goal: Knowledge of the prescribed therapeutic regimen will improve ?Outcome: Completed/Met ?Goal: Understanding of discharge needs will improve ?Outcome: Completed/Met ?Goal: Individualized Educational Video(s) ?Outcome: Completed/Met ?  ?Problem: Activity: ?Goal: Ability to avoid complications of mobility impairment will improve ?Outcome: Completed/Met ?Goal: Ability to tolerate increased activity will improve ?Outcome: Completed/Met ?  ?Problem: Clinical Measurements: ?Goal: Postoperative complications will be avoided or minimized ?Outcome: Completed/Met ?  ?Problem: Clinical Measurements: ?Goal: Postoperative complications will be avoided or minimized ?Outcome: Completed/Met ?  ?Problem: Pain Management: ?Goal: Pain level will decrease with appropriate interventions ?Outcome: Completed/Met ?  ?Problem: Skin Integrity: ?Goal: Will show signs of wound healing ?Outcome: Completed/Met ?  ?Problem: Safety: ?Goal: Ability to remain free from injury will improve ?Outcome: Completed/Met ?  ?

## 2021-12-22 NOTE — Progress Notes (Signed)
Physical Therapy Treatment ?Patient Details ?Name: Shawn York ?MRN: [id number] ?DOB: April 13, 1976 ?Today's Date: 12/22/2021 ? ? ?History of Present Illness Pt is 46 yo male s/p R anterior THA on 12/21/21.  Pt with hx of AVN bil hip, brain CA 2012 with craniotomy, PTSD, HTN ? ?  ?PT Comments  ? ? Continuing work on functional mobility and activity tolerance;  Pregressed to managing bed mobiltiy, transfers, and ambulation independently/modified independently; discussed car transfers; Questions solicited and answered; OK for dc home from PT standpoint; Acute PT goals met; will sign off ?  ?Recommendations for follow up therapy are one component of a multi-disciplinary discharge planning process, led by the attending physician.  Recommendations may be updated based on patient status, additional functional criteria and insurance authorization. ? ?Follow Up Recommendations ? Follow physician's recommendations for discharge plan and follow up therapies; Outpt PT ?  ?  ?Assistance Recommended at Discharge Intermittent Supervision/Assistance  ?Patient can return home with the following A little help with walking and/or transfers;A little help with bathing/dressing/bathroom;Help with stairs or ramp for entrance ?  ?Equipment Recommendations ? Rolling walker (2 wheels);BSC/3in1 (Likely already has)  ?  ?Recommendations for Other Services   ? ? ?  ?Precautions / Restrictions Precautions ?Precautions: None ?Restrictions ?RLE Weight Bearing: Weight bearing as tolerated  ?  ? ?Mobility ? Bed Mobility ?Overal bed mobility: Modified Independent ?Bed Mobility: Supine to Sit ?  ?  ?  ?  ?  ?General bed mobility comments: No difficulty getting up on R side of bed ?  ? ?Transfers ?Overall transfer level: Modified independent ?Equipment used: Rolling walker (2 wheels) ?Transfers: Sit to/from Stand ?Sit to Stand: Modified independent (Device/Increase time) ?  ?  ?  ?  ?  ?General transfer comment: No difficulty ?   ? ?Ambulation/Gait ?Ambulation/Gait assistance: Supervision, Modified independent (Device/Increase time) ?Gait Distance (Feet): 180 Feet ?Assistive device: Rolling walker (2 wheels) ?Gait Pattern/deviations: Step-through pattern ?  ?  ?  ?General Gait Details: Cues for sequencing and RW managment; progressed to modI ? ? ?Stairs ?Stairs: Yes ?Stairs assistance: Min guard ?Stair Management: No rails, Backwards, Step to pattern, With walker ?Number of Stairs: 5 (x2) ?General stair comments: Initial cues for sequence and technique; Second bout pt was able to cue his wife corrently re: how to steady RW ? ? ?Wheelchair Mobility ?  ? ?Modified Rankin (Stroke Patients Only) ?  ? ? ?  ?Balance   ?  ?Sitting balance-Leahy Scale: Good ?  ?  ?  ?Standing balance-Leahy Scale: Fair (approaching Good) ?  ?  ?  ?  ?  ?  ?  ?  ?  ?  ?  ?  ?  ? ?  ?Cognition Arousal/Alertness: Awake/alert ?Behavior During Therapy: [facility] for tasks assessed/performed ?Overall Cognitive Status: Within Functional Limits for tasks assessed ?  ?  ?  ?  ?  ?  ?  ?  ?  ?  ?  ?  ?  ?  ?  ?  ?  ?  ?  ? ?  ?Exercises Other Exercises ?Other Exercises: Manual strecth to R quad in standing ? ?  ?General Comments General comments (skin integrity, edema, etc.): Discussed car transfer, what to anticipate re: progression ?  ?  ? ?Pertinent Vitals/Pain Pain Assessment ?Pain Assessment: 0-10 ?Pain Score: 6  ?Pain Location: R hip ?Pain Descriptors / Indicators: Sore (Tight) ?Pain Intervention(s): Monitored during session  ? ? ?Home Living   ?  ?  ?  ?  ?  ?  ?  ?  ?  ?   ?  ?  Prior Function    ?  ?  ?   ? ?PT Goals (current goals can now be found in the care plan section) Acute Rehab PT Goals ?Patient Stated Goal: return home ?PT Goal Formulation: With patient ?Progress towards PT goals: Goals met/education completed, patient discharged from PT ? ?  ?Frequency ? ? ? 7X/week ? ? ? ?  ?PT Plan Current plan remains appropriate  ? ? ?Co-evaluation   ?  ?  ?  ?  ? ?  ?AM-PAC PT  "6 Clicks" Mobility   ?Outcome Measure ? Help needed turning from your back to your side while in a flat bed without using bedrails?: None ?Help needed moving from lying on your back to sitting on the side of a flat bed without using bedrails?: None ?Help needed moving to and from a bed to a chair (including a wheelchair)?: None ?Help needed standing up from a chair using your arms (e.g., wheelchair or bedside chair)?: None ?Help needed to walk in hospital room?: None ?Help needed climbing 3-5 steps with a railing? : A Little ?6 Click Score: 23 ? ?  ?End of Session   ?Activity Tolerance: Patient tolerated treatment well ?Patient left: Other (comment) (Up walking with wife) ?Nurse Communication: Mobility status (ok for dc home) ?PT Visit Diagnosis: Other abnormalities of gait and mobility (R26.89);Muscle weakness (generalized) (M62.81) ?  ? ? ?Time: 8453-6468 ?PT Time Calculation (min) (ACUTE ONLY): 22 min ? ?Charges:  $Gait Training: 8-22 mins          ?          ? ?Roney Marion, PT  ?Acute Rehabilitation Services ?Office [phone] ? ? ? ?Colletta [place] ?12/22/2021, 8:51 AM ? ?

## 2021-12-22 NOTE — Discharge Summary (Signed)
Patient ID: ?Shawn York ?MRN: [id number] ?DOB/AGE: 05/27/1976 46 y.o. ? ?Admit date: 12/21/2021 ?Discharge date: 12/22/2021 ? ?Admission Diagnoses:  ?Principal Problem: ?  Avascular necrosis of bone of right hip ([place]) ?Active Problems: ?  Status post total replacement of right hip ? ? ?Discharge Diagnoses:  ?Same ? ?Past Medical History:  ?Diagnosis Date  ? Anemia   ? Avascular necrosis ([place])   ? 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 Depression   ? Epistaxis   ? Headache   ? Hypertension   ? PTSD (post-traumatic stress disorder)   ? Seizures ([place])   ? ? ?Surgeries: Procedure(s): ?RIGHT TOTAL HIP REPLACEMENT on 12/21/2021 ?  ?Consultants:  ? ?Discharged Condition: Improved ? ?Hospital Course: Shawn York is an 46 y.o. male who was admitted 12/21/2021 for operative treatment ofAvascular necrosis of bone of right hip ([place]). Patient has severe unremitting pain that affects sleep, daily activities, and work/hobbies. After pre-op clearance the patient was taken to the operating room on 12/21/2021 and underwent  Procedure(s): ?RIGHT TOTAL HIP REPLACEMENT.   ? ?Patient was given perioperative antibiotics:  ?Anti-infectives (From admission, onward)  ? ? Start     Dose/Rate Route Frequency Ordered Stop  ? 12/21/21 1600  ceFAZolin (ANCEF) IVPB 2g/100 mL premix       ? 2 g ?200 mL/hr over 30 Minutes Intravenous Every 6 hours 12/21/21 1314 12/21/21 2309  ? 12/21/21 1025  vancomycin (VANCOCIN) powder  Status:  Discontinued       ?   As needed 12/21/21 1026 12/21/21 1132  ? 12/21/21 0815  clindamycin (CLEOCIN) IVPB 900 mg       ? 900 mg ?100 mL/hr over 30 Minutes Intravenous On call to O.R. 12/21/21 0812 12/21/21 1005  ? 12/21/21 0813  clindamycin (CLEOCIN) 900 MG/50ML IVPB       ?Note to Pharmacy: Gustavo Lah J: cabinet override  ?    12/21/21 0813 12/21/21 1005  ? ?  ?  ? ?Patient was given sequential compression devices, early ambulation, and chemoprophylaxis to prevent DVT. ? ?Patient benefited  maximally from hospital stay and there were no complications.   ? ?Recent vital signs: Patient Vitals for the past 24 hrs: ? BP Temp Temp src Pulse Resp SpO2  ?12/22/21 0553 117/76 98.1 ?F (36.7 ?C) -- 64 18 100 %  ?12/21/21 2317 117/76 98.4 ?F (36.9 ?C) Oral 69 18 99 %  ?12/21/21 2000 119/79 98.4 ?F (36.9 ?C) Oral 78 18 99 %  ?12/21/21 1550 123/82 98.5 ?F (36.9 ?C) -- 66 16 100 %  ?12/21/21 1321 (!) 136/99 (!) 97.4 ?F (36.3 ?C) -- (!) 46 18 100 %  ?12/21/21 1305 123/90 -- -- (!) 48 15 100 %  ?12/21/21 1235 114/80 98.5 ?F (36.9 ?C) -- (!) 59 19 100 %  ?12/21/21 1220 112/85 -- -- (!) 45 14 100 %  ?12/21/21 1205 108/75 -- -- (!) 51 16 100 %  ?12/21/21 1150 102/66 -- -- (!) 55 15 100 %  ?12/21/21 1135 108/65 98.5 ?F (36.9 ?C) -- 63 11 98 %  ?  ? ?Recent laboratory studies:  ?Recent Labs  ?  12/22/21 ?0635  ?WBC 5.2  ?HGB 11.6*  ?HCT 34.9*  ?PLT 129*  ? ? ? ?Discharge Medications:   ?Allergies as of 12/22/2021   ? ?   Reactions  ? Iodinated Contrast Media Rash, Anaphylaxis  ? Penicillins Shortness Of Breath, Rash  ? Did it involve swelling of the face/tongue/throat, SOB, or  low BP? Yes ?Did it involve sudden or severe rash/hives, skin peeling, or any reaction on the inside of your mouth or nose? No ?Did you need to seek medical attention at a hospital or doctor's office? No ?When did it last happen? Unk ?If all above answers are ?NO?, may proceed with cephalosporin use.  ? Shellfish Allergy Anaphylaxis, Rash  ? Shellfish-derived Products Anaphylaxis, Rash  ? ?  ? ?  ?Medication List  ?  ? ?STOP taking these medications   ? ?acetaminophen 500 MG tablet ?Commonly known as: TYLENOL ?  ?aspirin EC 81 MG tablet ?  ? ?  ? ?TAKE these medications   ? ?busPIRone 30 MG tablet ?Commonly known as: BUSPAR ?Take 1 tablet (30 mg total) by mouth 2 (two) times daily. ?  ?docusate sodium 100 MG capsule ?Commonly known as: Colace ?Take 1 capsule (100 mg total) by mouth daily as needed. ?  ?enoxaparin 40 MG/0.4ML injection ?Commonly known  as: LOVENOX ?Inject 0.4 mLs (40 mg total) into the skin daily for 28 doses. ?  ?EPINEPHrine 0.3 mg/0.3 mL Soaj injection ?Commonly known as: EPI-PEN ?Inject 0.3 mg into the muscle once as needed (for a severe, allergic reaction). ?  ?methocarbamol 500 MG tablet ?Commonly known as: Robaxin ?Take 1 tablet (500 mg total) by mouth 2 (two) times daily as needed. ?  ?nebivolol 5 MG tablet ?Commonly known as: BYSTOLIC ?Take 1 tablet by mouth daily. ?  ?ondansetron 4 MG tablet ?Commonly known as: Zofran ?Take 1 tablet (4 mg total) by mouth every 8 (eight) hours as needed for nausea or vomiting. ?  ?Oxcarbazepine 300 MG tablet ?Commonly known as: TRILEPTAL ?Take 1 tablet (300 mg total) by mouth 2 (two) times daily. ?  ?oxyCODONE-acetaminophen 5-325 MG tablet ?Commonly known as: Percocet ?Take 1-2 tablets by mouth every 6 (six) hours as needed. ?  ?thiamine 100 MG tablet ?Take 1 tablet (100 mg total) by mouth daily. ?  ?vortioxetine HBr 10 MG Tabs tablet ?Commonly known as: TRINTELLIX ?Take 1 tablet (10 mg total) by mouth daily. ?  ? ?  ? ?  ?  ? ? ?  ?Durable Medical Equipment  ?(From admission, onward)  ?  ? ? ?  ? ?  Start     Ordered  ? 12/21/21 1315  DME Walker rolling  Once       ?Question:  Patient needs a walker to treat with the following condition  Answer:  History of hip replacement  ? 12/21/21 1314  ? 12/21/21 1315  DME 3 n 1  Once       ? 12/21/21 1314  ? 12/21/21 1315  DME Bedside commode  Once       ?Question:  Patient needs a bedside commode to treat with the following condition  Answer:  History of hip replacement  ? 12/21/21 1314  ? ?  ?  ? ?  ? ? ?Diagnostic Studies: DG Pelvis Portable ? ?Result Date: 12/21/2021 ?CLINICAL DATA:  Postoperative for right total hip arthroplasty EXAM: PORTABLE PELVIS 1-2 VIEWS COMPARISON:  12/21/2021 intraoperative views, pelvic radiographs from 11/02/2021 FINDINGS: Right total hip prosthesis in place without visible periprosthetic fracture on this frontal projection. Expected  gas in the soft tissues. No early complicating feature identified. Abnormal sclerosis in the left femoral head favoring chronic avascular necrosis, similar to the 11/02/2021 exam. IMPRESSION: 1. Right total hip prosthesis in place without early complicating feature appreciable. 2. Sclerosis compatible with chronic AVN in the left femoral head. Electronically  Signed   By: Van Clines M.D.   On: 12/21/2021 12:25  ? ?DG C-Arm 1-60 Min-No Report ? ?Result Date: 12/21/2021 ?Fluoroscopy was utilized by the requesting physician.  No radiographic interpretation.  ? ?DG C-Arm 1-60 Min-No Report ? ?Result Date: 12/21/2021 ?Fluoroscopy was utilized by the requesting physician.  No radiographic interpretation.  ? ?DG HIP UNILAT WITH PELVIS 2-3 VIEWS RIGHT ? ?Result Date: 12/21/2021 ?CLINICAL DATA:  Total right hip EXAM: DG HIP (WITH OR WITHOUT PELVIS) 2V RIGHT COMPARISON:  None. FINDINGS: Fluoroscopic images were obtained intraoperatively and submitted for post operative interpretation. Right total hip arthroplasty with hardware in expected position, for images were obtained with 19 seconds of fluoroscopy time and 2.44 mGy. Please see the performing provider's procedural report for further detail. IMPRESSION: As above. Electronically Signed   By: Yetta Glassman M.D.   On: 12/21/2021 11:41   ? ?Disposition: Discharge disposition: 01-Home or Self Care ? ? ? ? ? ? ? ? ? Follow-up Information   ? ? Leandrew Koyanagi, MD. Schedule an appointment as soon as possible for a visit in 2 week(s).   ?Specialty: Orthopedic Surgery ?Contact information: ?[address] ?[place] [address] ?[phone] ? ? ?  ?  ? ?  ?  ? ?  ? ? ? ?Signed: ?Aundra Dubin ?12/22/2021, 8:11 AM ? ? ? ? ?

## 2021-12-22 NOTE — Progress Notes (Signed)
Subjective: ?1 Day Post-Op Procedure(s) (LRB): ?RIGHT TOTAL HIP REPLACEMENT (Right) ?Patient reports pain as mild.   ? ?Objective: ?Vital signs in last 24 hours: ?Temp:  [97.4 ?F (36.3 ?C)-98.5 ?F (36.9 ?C)] 98.1 ?F (36.7 ?C) (04/26 4888) ?Pulse Rate:  [45-78] 64 (04/26 0553) ?Resp:  [11-19] 18 (04/26 0553) ?BP: (102-136)/(65-99) 117/76 (04/26 0553) ?SpO2:  [98 %-100 %] 100 % (04/26 0553) ? ?Intake/Output from previous day: ?04/25 0701 - 04/26 0700 ?In: 1600.1 [P.O.:450; I.V.:750; IV Piggyback:400.1] ?Out: 1875 [BVQXI:5038; Blood:50] ?Intake/Output this shift: ?No intake/output data recorded. ? ?Recent Labs  ?  12/22/21 ?0635  ?HGB 11.6*  ? ?Recent Labs  ?  12/22/21 ?0635  ?WBC 5.2  ?RBC 3.55*  ?HCT 34.9*  ?PLT 129*  ? ?No results for input(s): NA, K, CL, CO2, BUN, CREATININE, GLUCOSE, CALCIUM in the last 72 hours. ?No results for input(s): LABPT, INR in the last 72 hours. ? ?Neurologically intact ?Neurovascular intact ?Sensation intact distally ?Intact pulses distally ?Dorsiflexion/Plantar flexion intact ?Incision: dressing C/D/I ?No cellulitis present ?Compartment soft ? ? ?Assessment/Plan: ?1 Day Post-Op Procedure(s) (LRB): ?RIGHT TOTAL HIP REPLACEMENT (Right) ?Advance diet ?Up with therapy ?D/C IV fluids ?WBAT RLE ?ABLA mild and stable ?Will switch mechanical dvt ppx from asa to lovenox.  Has not yet had aspirin ? ? ? ? ? ? ?Aundra Dubin ?12/22/2021, 8:10 AM ? ?

## 2021-12-22 NOTE — TOC Progression Note (Addendum)
Transition of Care (TOC) - Progression Note  ? ? ?Patient Details  ?Name: Shawn York ?MRN: [id number] ?Date of Birth: [birth year] ? ?Transition of Care (TOC) CM/SW Contact  ?Marilu Favre, RN ?Phone Number: ?12/22/2021, 8:20 AM ? ?Clinical Narrative:    ? ?[facility] unable to accept.  ? ?NCM calling other agencies.  ? ?Waiting to hear back from [place], [place], [place], [facility] and [facility]  ? ? ?Unable to find another home health agency to accept  [facility] now able to accept  ?[facility] will provide any needed DME  ?  ?  ? ?Expected Discharge Plan and Services ?  ?  ?  ?  ?  ?Expected Discharge Date: 12/22/21               ?  ?  ?  ?  ?  ?  ?  ?  ?  ?  ? ? ?Social Determinants of Health (SDOH) Interventions ?  ? ?Readmission Risk Interventions ?   ? View : No data to display.  ?  ?  ?  ? ? ?

## 2021-12-27 ENCOUNTER — Encounter: Payer: Self-pay | Admitting: Orthopaedic Surgery

## 2021-12-28 ENCOUNTER — Encounter: Payer: BC Managed Care – PPO | Admitting: Orthopaedic Surgery

## 2021-12-28 NOTE — Telephone Encounter (Signed)
Everyone has different allergies and things may interact differently from person to person.  If he thinks he  may have had a previous reaction to robaxin, we can send in something else

## 2021-12-30 ENCOUNTER — Ambulatory Visit: Payer: BC Managed Care – PPO | Admitting: Internal Medicine

## 2021-12-31 ENCOUNTER — Encounter: Payer: Self-pay | Admitting: Orthopaedic Surgery

## 2022-01-03 ENCOUNTER — Ambulatory Visit (INDEPENDENT_AMBULATORY_CARE_PROVIDER_SITE_OTHER): Payer: BC Managed Care – PPO | Admitting: Psychiatry

## 2022-01-03 ENCOUNTER — Other Ambulatory Visit: Payer: Self-pay | Admitting: Physician Assistant

## 2022-01-03 DIAGNOSIS — F4310 Post-traumatic stress disorder, unspecified: Secondary | ICD-10-CM | POA: Diagnosis not present

## 2022-01-03 MED ORDER — OXYCODONE-ACETAMINOPHEN 5-325 MG PO TABS: 1.0000 | ORAL_TABLET | Freq: Four times a day (QID) | ORAL | 0 refills | Status: DC | PRN

## 2022-01-03 NOTE — Progress Notes (Signed)
?      [facility] Counselor/Therapist Progress Note ? ?Patient ID: Shawn York, MRN: [id number],   ? ?Date: 01/03/2022 ? ?Time Spent: 58 minutes start time 9:59 AM end time 10:57 AM ? ?Treatment Type: Individual Therapy ? ?Reported Symptoms: pain issues, sadness, triggered responses, sleep issues ? ?Mental Status Exam: ? ?Appearance:   Casual and Neat     ?Behavior:  Appropriate  ?Motor:  Normal  ?Speech/Language:   Negative and Normal Rate  ?Affect:  Appropriate tearful  ?Mood:  sad  ?Thought process:  normal  ?Thought content:    WNL  ?Sensory/Perceptual disturbances:    WNL  ?Orientation:  oriented to person, place, time/date, and situation  ?Attention:  Good  ?Concentration:  Good  ?Memory:  WNL  ?Fund of knowledge:   Good  ?Insight:    Good  ?Judgment:   Good  ?Impulse Control:  Good  ? ?Risk Assessment: ?Danger to Self:  No ?Self-injurious Behavior: No ?Danger to Others: No ?Duty to Warn:no ?Physical Aggression / Violence:No  ?Access to Firearms a concern: No  ?Gang Involvement:No  ? ?Subjective: Patient was present for session.  Patient updated social history.  He shared that he has had 3 surgeries over the past several months. He is still in the healing process.  He went on to share that his brother died in 09-07-23.  Patient explained it was very traumatic and very quick because he had an aneurysm.  Patient went on to share that it has been a difficult transition to go through the loss of his brother and then having to adjust with being more of a caretaker role for his parents.  Patient shared that he has realized that due to being in the [employer] witness group his parents did not make many decisions for themselves on different situations and now he is having to help navigate them through.  He explained that it gets very overwhelming but he is not sure what else to do.  Patient was allowed time to update treatment plan and goals in session.  He was also able to discuss the surgeries and illnesses that he has  had recently that have caused him to have to make different life changes.  Patient reported those have been very traumatic as well and very painful.  Patient was encouraged to continue working on taking things one step at a time and agreed that processing would start next session. ? ?Interventions: Solution-Oriented/Positive Psychology ? ?Diagnosis: ?  ICD-10-CM   ?1. PTSD (post-traumatic stress disorder)  F43.10   ?  ? ? ?Plan: Patient is to use CBT and coping skills to decrease anger responses.  Patient is to continue working on healing his body.  Patient is to talk to his wife about different strategies for communication and problem-solving to help deal with triggering each other.  Patient is to take medication as directed ?Long-term goal: display a full range of emotions without experiencing loss of control ?Short-term goal: Practice implement relaxation training as a coping mechanism for tension panic stress anger and anxiety ?Lina Sayre, [facility] ? ? ? ? ? ? ? ? ? ? ? ? ? ? ? ? ? ? ?

## 2022-01-05 ENCOUNTER — Ambulatory Visit (INDEPENDENT_AMBULATORY_CARE_PROVIDER_SITE_OTHER): Payer: BC Managed Care – PPO

## 2022-01-05 ENCOUNTER — Ambulatory Visit (INDEPENDENT_AMBULATORY_CARE_PROVIDER_SITE_OTHER): Payer: BC Managed Care – PPO | Admitting: Orthopaedic Surgery

## 2022-01-05 ENCOUNTER — Encounter: Payer: Self-pay | Admitting: Orthopaedic Surgery

## 2022-01-05 DIAGNOSIS — Z96641 Presence of right artificial hip joint: Secondary | ICD-10-CM | POA: Diagnosis not present

## 2022-01-05 NOTE — Progress Notes (Signed)
? ?Post-Op Visit Note ?  ?Patient: Shawn York           ?Date of Birth: [birth year]           ?MRN: [id number] ?Visit Date: 01/05/2022 ?PCP: Janith Lima, MD ? ? ?Assessment & Plan: ? ?Chief Complaint:  ?Chief Complaint  ?Patient presents with  ? Right Hip - Routine Post Op  ? ?Visit Diagnoses:  ?1. Status post total replacement of right hip   ? ? ?Plan: Shawn York is 2 weeks status post right total hip replacement for AVN.  Overall doing well reports muscle stiffness and soreness.  He has 28 days of Lovenox.  No real complaints. ? ?Examination of the right hip shows a healed surgical incision with minimal swelling.  Ambulating with a single-point cane.  No neurovascular compromise. ? ?Shawn York is doing very well from his hip replacement.  Wound care instructions reviewed.  Continue Lovenox for 28 days.  Handicap placard and implant card provided today.  Questions encouraged and answered.  Follow-up in 4 weeks for recheck and standing AP pelvis x-rays. ? ?Follow-Up Instructions: Return in about 4 weeks (around 02/02/2022).  ? ?Orders:  ?Orders Placed This Encounter  ?Procedures  ? XR HIP UNILAT W OR W/O PELVIS 2-3 VIEWS RIGHT  ? ?No orders of the defined types were placed in this encounter. ? ? ?Imaging: ?XR HIP UNILAT W OR W/O PELVIS 2-3 VIEWS RIGHT ? ?Result Date: 01/05/2022 ?Stable right total hip replacement without complication.  ? ?PMFS History: ?Patient Active Problem List  ? Diagnosis Date Noted  ? Status post total replacement of right hip 12/21/2021  ? Anemia due to acquired thiamine deficiency 12/01/2021  ? Deficiency anemia 11/22/2021  ? Left-sided epistaxis 11/10/2021  ? Seasonal allergic rhinitis due to pollen 11/08/2021  ? Family history of dissecting aortic aneurysm 11/08/2021  ? Contrast media allergy 11/08/2021  ? Avascular necrosis of bone of right hip ([place]) 11/02/2021  ? Avascular necrosis of bone of left hip ([place]) 11/02/2021  ? Primary hypertension 05/17/2021  ? Encounter for general adult medical  examination with abnormal findings 05/17/2021  ? PTSD (post-traumatic stress disorder) 06/05/2018  ? Nightmares associated with chronic post-traumatic stress disorder 09/27/2017  ? Brain tumor, glioma ([place]) 09/27/2017  ? Severe episode of recurrent major depressive disorder, without psychotic features ([place])   ? Major depressive disorder, recurrent episode ([place]) 08/17/2016  ? 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 ?Past Medical History:  ?Diagnosis Date  ? Anemia   ? Avascular necrosis ([place])   ? Brain cancer ([place]) 02/25/2011  ? grade III anaplastic ologodendrglioma  ? Depression   ? Epistaxis   ? Headache   ? Hypertension   ? PTSD (post-traumatic stress disorder)   ? Seizures ([place])   ?  ?Family History  ?Problem Relation Age of Onset  ? Cancer Other   ?     brain cancer  ? Cancer Father   ?     skin cancer  ? Cancer Maternal Grandmother 60  ?     brain cancer  ? Alcohol abuse Brother   ? Alzheimer's disease Paternal Grandmother   ? Dementia Paternal Grandmother   ? Heart disease Paternal Grandfather   ?  ?Past Surgical History:  ?Procedure Laterality Date  ? BASAL CELL CARCINOMA EXCISION  1995  ? CRANIOTOMY FOR TUMOR Right 02/25/2011  ? IR ANGIO EXTERNAL CAROTID SEL EXT CAROTID BILAT MOD SED  11/16/2021  ? IR ANGIO INTRA EXTRACRAN SEL COM CAROTID  INNOMINATE UNI R MOD SED  11/16/2021  ? IR ANGIO INTRA EXTRACRAN SEL INTERNAL CAROTID UNI L MOD SED  11/16/2021  ? IR ANGIO VERTEBRAL SEL VERTEBRAL UNI R MOD SED  11/16/2021  ? IR ANGIOGRAM FOLLOW UP STUDY  11/17/2021  ? IR NEURO EACH ADD'L AFTER BASIC UNI LEFT (MS)  11/17/2021  ? IR NEURO EACH ADD'L AFTER BASIC UNI RIGHT (MS)  11/17/2021  ? IR TRANSCATH/EMBOLIZ  11/16/2021  ? IR US GUIDE VASC ACCESS RIGHT  11/16/2021  ? RADIOLOGY WITH ANESTHESIA N/A 11/16/2021  ? Procedure: IR WITH ANESTHESIA;  Surgeon: Pedro Earls, MD;  Location: [facility];  Service: Radiology;  Laterality: N/A;  ? TOTAL HIP ARTHROPLASTY Right 12/21/2021  ?  Procedure: RIGHT TOTAL HIP REPLACEMENT;  Surgeon: Leandrew Koyanagi, MD;  Location: [facility];  Service: Orthopedics;  Laterality: Right;  3-C  ? WISDOM TOOTH EXTRACTION    ? ?Social History  ? ?Occupational History  ? Not on file  ?Tobacco Use  ? Smoking status: Former  ?  Types: Cigarettes  ?  Quit date: 02/25/2011  ?  Years since quitting: 10.8  ? Smokeless tobacco: Never  ?Vaping Use  ? Vaping Use: Never used  ?Substance and Sexual Activity  ? Alcohol use: Not Currently  ?  Alcohol/week: 14.0 standard drinks  ?  Types: 14 Cans of beer per week  ?  Comment: quit 11-08-2021  ? Drug use: No  ? Sexual activity: Yes  ?  Comment: E-cigarette users  ? ? ? ?

## 2022-01-14 ENCOUNTER — Other Ambulatory Visit: Payer: Self-pay | Admitting: Internal Medicine

## 2022-01-14 DIAGNOSIS — Z91041 Radiographic dye allergy status: Secondary | ICD-10-CM

## 2022-01-14 MED ORDER — DIPHENHYDRAMINE HCL 25 MG PO TABS: ORAL_TABLET | ORAL | 0 refills | Status: DC

## 2022-01-14 MED ORDER — PREDNISONE 50 MG PO TABS: ORAL_TABLET | ORAL | 0 refills | Status: AC

## 2022-01-25 ENCOUNTER — Other Ambulatory Visit (HOSPITAL_COMMUNITY): Payer: BC Managed Care – PPO

## 2022-01-25 DIAGNOSIS — Z6825 Body mass index (BMI) 25.0-25.9, adult: Secondary | ICD-10-CM | POA: Diagnosis not present

## 2022-01-25 DIAGNOSIS — M5412 Radiculopathy, cervical region: Secondary | ICD-10-CM | POA: Diagnosis not present

## 2022-01-26 ENCOUNTER — Ambulatory Visit (INDEPENDENT_AMBULATORY_CARE_PROVIDER_SITE_OTHER): Payer: BC Managed Care – PPO | Admitting: Psychiatry

## 2022-01-26 DIAGNOSIS — F4310 Post-traumatic stress disorder, unspecified: Secondary | ICD-10-CM

## 2022-01-26 NOTE — Progress Notes (Signed)
      [facility] Counselor/Therapist Progress Note  Patient ID: Shawn York, MRN: [id number],    Date: 01/26/2022  Time Spent: 49 minutes start time 10:08 AM end time 10:57 AM  Treatment Type: Individual Therapy  Reported Symptoms: triggered responses, anxiety, sadness  Mental Status Exam:  Appearance:   Well Groomed     Behavior:  Appropriate  Motor:  Normal  Speech/Language:   Normal Rate  Affect:  Appropriate  Mood:  anxious  Thought process:  normal  Thought content:    WNL  Sensory/Perceptual disturbances:    WNL  Orientation:  oriented to person, place, time/date, and situation  Attention:  Good  Concentration:  Good  Memory:  WNL  Fund of knowledge:   Good  Insight:    Good  Judgment:   Good  Impulse Control:  Good   Risk Assessment: Danger to Self:  No Self-injurious Behavior: No Danger to Others: No Duty to Warn:no Physical Aggression / Violence:No  Access to Firearms a concern: No  Gang Involvement:No   Subjective: Patient was present for session.  He shared that he is feeling triggered by things going on with his wife recently.  Patient reported he is not sure if his communication is off or what was going on within the situation.  Patient was allowed time to share what he had been noticing and what she has been saying.  Patient was able to recognize that he is getting triggered at times when he communicates and that is impacting the way he responds to her and potentially others.  Discussed different ways to talk himself through the triggered responses as well as different ways of communicating his thoughts and concerns with others so they are not coming out in a way that he does not want them to.  Was encouraged to talk to his wife about what he recognized in session.  The importance of them having time to connect and affirm each other regularly was also addressed with patient.  Patient shared that has not been happening recently and that does seem to create more  tension between them.  Was also encouraged to watch podcast by Dr. Tawanna Solo on over explaining and how it is tied to trauma.  Interventions: Cognitive Behavioral Therapy, Assertiveness/Communication, and Solution-Oriented/Positive Psychology  Diagnosis:   ICD-10-CM   1. PTSD (post-traumatic stress disorder)  F43.10       Plan: Patient is to use CBT and coping skills to decrease anger responses.  Patient is to continue working on healing his body.  Patient is to talk to his wife about different strategies for communication and problem-solving to help deal with triggering each other.  Patient is to watch podcast by Dr. Tawanna Solo concerning over explaining and trauma.  Patient is to take medication as directed Long-term goal: display a full range of emotions without experiencing loss of control Short-term goal: Practice implement relaxation training as a coping mechanism for tension panic stress anger and anxiety  Lina Sayre, [facility]

## 2022-01-30 ENCOUNTER — Other Ambulatory Visit: Payer: Self-pay | Admitting: Physician Assistant

## 2022-02-01 ENCOUNTER — Telehealth: Payer: Self-pay

## 2022-02-01 NOTE — Telephone Encounter (Signed)
[name] from [place] [facility] is calling stating that the Pt is scheduled for 2 CT Angio on 02/03/22 and neither of them have Pre-cert completed.  [name] states that she contacted [place] and they states that it is required. Also contacted [facility] and they also stated that it is required.  [name] provided me the number to [place] to call [phone].  Shawn York is requesting a call back as soon as its been complete as it is very time sensitive or his appt will be cx [phone] Ext [phone].  Please advise

## 2022-02-02 ENCOUNTER — Ambulatory Visit (INDEPENDENT_AMBULATORY_CARE_PROVIDER_SITE_OTHER): Payer: BC Managed Care – PPO

## 2022-02-02 ENCOUNTER — Ambulatory Visit (INDEPENDENT_AMBULATORY_CARE_PROVIDER_SITE_OTHER): Payer: BC Managed Care – PPO | Admitting: Orthopaedic Surgery

## 2022-02-02 DIAGNOSIS — Z96641 Presence of right artificial hip joint: Secondary | ICD-10-CM

## 2022-02-02 NOTE — Progress Notes (Signed)
Post-Op Visit Note   Patient: Shawn York           Date of Birth: August 20, 1976           MRN: [id number] Visit Date: 02/02/2022 PCP: Shawn Lima, MD   Assessment & Plan:  Chief Complaint:  Chief Complaint  Patient presents with   Right Hip - Routine Post Op   Visit Diagnoses:  1. Status post total replacement of right hip     Plan: Shawn York is 6 weeks status post right total hip replacement on 12/21/2021 for AVN.  He has pain in his hip flexors when getting in the car.  He is some muscular discomfort.  The AVN pain is gone.  Examination right hip shows a fully healed surgical scar.  He has good range of motion the hip without pain.  Hip flexion strength is appropriate.  Total hip implant is stable without complication.  [name] will continue to increase activity as tolerated.  Work note filled out for today for 30 days light duty and then full duty after that.  Follow-up in 6 weeks for recheck.  Follow-Up Instructions: Return in about 6 weeks (around 03/16/2022).   Orders:  Orders Placed This Encounter  Procedures   XR Pelvis 1-2 Views   No orders of the defined types were placed in this encounter.   Imaging: XR Pelvis 1-2 Views  Result Date: 02/02/2022 Stable right total hip replacement without complications   PMFS History: Patient Active Problem List   Diagnosis Date Noted   Status post total replacement of right hip 12/21/2021   Anemia due to acquired thiamine deficiency 12/01/2021   Deficiency anemia 11/22/2021   Left-sided epistaxis 11/10/2021   Seasonal allergic rhinitis due to pollen 11/08/2021   Family history of dissecting aortic aneurysm 11/08/2021   Contrast media allergy 11/08/2021   Avascular necrosis of bone of right hip ([place]) 11/02/2021   Avascular necrosis of bone of left hip ([place]) 11/02/2021   Primary hypertension 05/17/2021   Encounter for general adult medical examination with abnormal findings 05/17/2021   PTSD (post-traumatic stress disorder)  06/05/2018   Nightmares associated with chronic post-traumatic stress disorder 09/27/2017   Brain tumor, glioma ([place]) 09/27/2017   Severe episode of recurrent major depressive disorder, without psychotic features ([place])    Major depressive disorder, recurrent episode ([place]) 08/17/2016   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   Past Medical History:  Diagnosis Date   Anemia    Avascular necrosis (HCC)    Brain cancer ([place]) 02/25/2011   grade III anaplastic ologodendrglioma   Depression    Epistaxis    Headache    Hypertension    PTSD (post-traumatic stress disorder)    Seizures ([place])     Family History  Problem Relation Age of Onset   Cancer Other        brain cancer   Cancer Father        skin cancer   Cancer Maternal Grandmother 29       brain cancer   Alcohol abuse Brother    Alzheimer's disease Paternal Grandmother    Dementia Paternal Grandmother    Heart disease Paternal Grandfather     Past Surgical History:  Procedure Laterality Date   BASAL CELL CARCINOMA EXCISION  1995   CRANIOTOMY FOR TUMOR Right 02/25/2011   IR ANGIO EXTERNAL CAROTID SEL EXT CAROTID BILAT MOD SED  11/16/2021   IR ANGIO INTRA EXTRACRAN SEL COM CAROTID INNOMINATE UNI R MOD SED  11/16/2021   IR ANGIO INTRA EXTRACRAN SEL INTERNAL CAROTID UNI L MOD SED  11/16/2021   IR ANGIO VERTEBRAL SEL VERTEBRAL UNI R MOD SED  11/16/2021   IR ANGIOGRAM FOLLOW UP STUDY  11/17/2021   IR NEURO EACH ADD'L AFTER BASIC UNI LEFT (MS)  11/17/2021   IR NEURO EACH ADD'L AFTER BASIC UNI RIGHT (MS)  11/17/2021   IR TRANSCATH/EMBOLIZ  11/16/2021   IR US GUIDE VASC ACCESS RIGHT  11/16/2021   RADIOLOGY WITH ANESTHESIA N/A 11/16/2021   Procedure: IR WITH ANESTHESIA;  Surgeon: Pedro Earls, MD;  Location: [facility];  Service: Radiology;  Laterality: N/A;   TOTAL HIP ARTHROPLASTY Right 12/21/2021   Procedure: RIGHT TOTAL HIP REPLACEMENT;  Surgeon: Leandrew Koyanagi, MD;  Location: [facility];  Service:  Orthopedics;  Laterality: Right;  3-C   WISDOM TOOTH EXTRACTION     Social History   Occupational History   Not on file  Tobacco Use   Smoking status: Former    Types: Cigarettes    Quit date: 02/25/2011    Years since quitting: 10.9   Smokeless tobacco: Never  Vaping Use   Vaping Use: Never used  Substance and Sexual Activity   Alcohol use: Not Currently    Alcohol/week: 14.0 standard drinks    Types: 14 Cans of beer per week    Comment: quit 11-08-2021   Drug use: No   Sexual activity: Yes    Comment: E-cigarette users

## 2022-02-03 ENCOUNTER — Ambulatory Visit (HOSPITAL_COMMUNITY): Payer: BC Managed Care – PPO

## 2022-02-04 ENCOUNTER — Encounter: Payer: Self-pay | Admitting: Orthopaedic Surgery

## 2022-02-09 NOTE — Telephone Encounter (Signed)
Insurance wants a peer to peer tracking # [id number] for CPT [id number] and [phone] Phone number for peer to peer is [phone]. Message sent to Dr Ronnald Ramp.

## 2022-02-10 ENCOUNTER — Telehealth: Payer: Self-pay | Admitting: Internal Medicine

## 2022-02-10 NOTE — Telephone Encounter (Signed)
[name] called in and states the authorization was denied and pt has an appt scheduled on 6.19.2023.   Requesting that [facility] call back ASAP.

## 2022-02-11 ENCOUNTER — Telehealth: Payer: Self-pay | Admitting: Internal Medicine

## 2022-02-11 ENCOUNTER — Other Ambulatory Visit: Payer: Self-pay | Admitting: Internal Medicine

## 2022-02-11 DIAGNOSIS — Z8249 Family history of ischemic heart disease and other diseases of the circulatory system: Secondary | ICD-10-CM

## 2022-02-11 NOTE — Telephone Encounter (Signed)
I spoke to Dr Scarlette Shorts and Donella Stade. Dr. Scarlette Shorts approved the CTA chest  Auth # [id number] Valid until 07/31/22  She did not approve the CTA abd and said an U/S should be done first - U/S ordered.  Scarlette Calico, MD

## 2022-02-11 NOTE — Telephone Encounter (Signed)
-----   Message from [place], [place] sent at 02/10/2022  1:10 PM EDT -----  ----- Message ----- From: Carlynn Purl Sent: 02/10/2022  11:49 AM EDT To: Jari Pigg, CMA  Message was sent to Dr Ronnald Ramp 02/08/22 with no response. Insurance wants a peer to peer tracking # [id number] for CPT [phone] and [id number] Phone number for peer to peer is [phone]. Message sent to Dr Ronnald Ramp

## 2022-02-11 NOTE — Telephone Encounter (Signed)
Damita Dunnings called from cardiovascular to ask about the [employer] [place] [employer] DUPLEX request that was placed today. Stated that there should be an ultrasound before hand, and he could not find the results of one, and also wanted to know if there were any additional care orders to follow after the procedure.   Damita Dunnings wants to make sure it is ok to proceed with the [employer] [place] [employer] DUPLEX.  Please advise and call him @  [phone]

## 2022-02-12 ENCOUNTER — Other Ambulatory Visit: Payer: Self-pay | Admitting: Psychiatry

## 2022-02-12 DIAGNOSIS — F4310 Post-traumatic stress disorder, unspecified: Secondary | ICD-10-CM

## 2022-02-12 DIAGNOSIS — F515 Nightmare disorder: Secondary | ICD-10-CM

## 2022-02-14 ENCOUNTER — Encounter: Payer: Self-pay | Admitting: Internal Medicine

## 2022-02-14 ENCOUNTER — Ambulatory Visit (HOSPITAL_COMMUNITY)
Admission: RE | Admit: 2022-02-14 | Discharge: 2022-02-14 | Disposition: A | Discharge status: Home / Self Care | Payer: BC Managed Care – PPO | Source: Ambulatory Visit | Attending: Internal Medicine | Admitting: Internal Medicine

## 2022-02-14 ENCOUNTER — Other Ambulatory Visit: Payer: Self-pay | Admitting: Physician Assistant

## 2022-02-14 ENCOUNTER — Ambulatory Visit (HOSPITAL_COMMUNITY): Payer: BC Managed Care – PPO

## 2022-02-14 DIAGNOSIS — Z8249 Family history of ischemic heart disease and other diseases of the circulatory system: Secondary | ICD-10-CM | POA: Insufficient documentation

## 2022-02-14 DIAGNOSIS — I7121 Aneurysm of the ascending aorta, without rupture: Secondary | ICD-10-CM | POA: Diagnosis not present

## 2022-02-14 MED ORDER — IOHEXOL 350 MG/ML SOLN
100.0000 mL | Freq: Once | INTRAVENOUS | Status: AC | PRN
  Administered 2022-02-14: 100 mL via INTRAVENOUS

## 2022-02-28 ENCOUNTER — Ambulatory Visit (INDEPENDENT_AMBULATORY_CARE_PROVIDER_SITE_OTHER): Payer: BC Managed Care – PPO | Admitting: Psychiatry

## 2022-02-28 ENCOUNTER — Ambulatory Visit (INDEPENDENT_AMBULATORY_CARE_PROVIDER_SITE_OTHER): Payer: BC Managed Care – PPO | Admitting: Internal Medicine

## 2022-02-28 VITALS — BP 128/88 | HR 62 | Temp 98.1°F | Resp 16 | Ht 73.0 in | Wt 198.0 lb

## 2022-02-28 DIAGNOSIS — F4310 Post-traumatic stress disorder, unspecified: Secondary | ICD-10-CM | POA: Diagnosis not present

## 2022-02-28 DIAGNOSIS — D538 Other specified nutritional anemias: Secondary | ICD-10-CM | POA: Diagnosis not present

## 2022-02-28 DIAGNOSIS — E519 Thiamine deficiency, unspecified: Secondary | ICD-10-CM | POA: Diagnosis not present

## 2022-02-28 DIAGNOSIS — I7121 Aneurysm of the ascending aorta, without rupture: Secondary | ICD-10-CM | POA: Diagnosis not present

## 2022-02-28 DIAGNOSIS — I10 Essential (primary) hypertension: Secondary | ICD-10-CM

## 2022-02-28 LAB — CBC WITH DIFFERENTIAL/PLATELET
Basophils Absolute: 0 10*3/uL (ref 0.0–0.1)
Basophils Relative: 0.5 % (ref 0.0–3.0)
Eosinophils Absolute: 0.1 10*3/uL (ref 0.0–0.7)
Eosinophils Relative: 1.2 % (ref 0.0–5.0)
HCT: 42.9 % (ref 39.0–52.0)
Hemoglobin: 14.6 g/dL (ref 13.0–17.0)
Lymphocytes Relative: 35.9 % (ref 12.0–46.0)
Lymphs Abs: 2.2 10*3/uL (ref 0.7–4.0)
MCHC: 34.1 g/dL (ref 30.0–36.0)
MCV: 93 fl (ref 78.0–100.0)
Monocytes Absolute: 0.5 10*3/uL (ref 0.1–1.0)
Monocytes Relative: 8.1 % (ref 3.0–12.0)
Neutro Abs: 3.3 10*3/uL (ref 1.4–7.7)
Neutrophils Relative %: 54.3 % (ref 43.0–77.0)
Platelets: 187 10*3/uL (ref 150.0–400.0)
RBC: 4.61 Mil/uL (ref 4.22–5.81)
RDW: 14.4 % (ref 11.5–15.5)
WBC: 6 10*3/uL (ref 4.0–10.5)

## 2022-02-28 NOTE — Progress Notes (Signed)
[facility] Counselor/Therapist Progress Note  Patient ID: Shawn York, MRN: [id number],    Date: 02/28/2022  Time Spent: 57 minutes start time 10:02 AM end time 10:59 AM  Treatment Type: Individual Therapy  Reported Symptoms: anxiety, health issues, sadness, triggered responses, rumination  Mental Status Exam:  Appearance:   Well Groomed     Behavior:  Appropriate  Motor:  Normal  Speech/Language:   Normal Rate  Affect:  Appropriate  Mood:  anxious  Thought process:  normal  Thought content:    WNL  Sensory/Perceptual disturbances:    WNL  Orientation:  oriented to person, place, time/date, and situation  Attention:  Good  Concentration:  Good  Memory:  WNL  Fund of knowledge:   Good  Insight:    Good  Judgment:   Good  Impulse Control:  Good   Risk Assessment: Danger to Self:  No Self-injurious Behavior: No Danger to Others: No Duty to Warn:no Physical Aggression / Violence:No  Access to Firearms a concern: No  Gang Involvement:No   Subjective: Patient was present for session.  He shared that after his brother died he got a CT and it came back that her has an cardiac thoracic aneurism.  He was only allowed to have a higher CT scan not a lower scan.  He is working with his physicians on the issue.  They reported at this point they just want to monitor things because it is not quite big enough to be removed.  He explained that knowing it is in their is very anxiety producing for him especially since he just lost his brother for the same reason.  Patient also reported that makes him think about what would happen with his family.  Patient explained this is the third aneurysm he knows currently and it is his body this year.  He is wondering if the chemotherapy he did is creating the problem within his body.  He shared that it is all very difficult and overwhelming.  Encouraged patient to think through ways that he can focus on his current situation and being thankful for the  fact that he is still here and he is able to enjoy his children and his wife.  Patient was reminded that thoughts lead to feelings lead to behaviors and he has to focus on his thoughts to make sure he puts the right things then and gives the direction he needs to.  He shared that his daughter almost drown yesterday and that was terrifying.  Patient shared what happened and how that did make him think more about things.  Again he was reminded of the importance of focusing on what he can control fix and change and trying to stay present and not allowing his brain to go in a negative direction that will not be helpful for him.  Also discussed different options of things that he can look into as potential job situations or careers that he might find more joy.    Interventions: Cognitive Behavioral Therapy and Solution-Oriented/Positive Psychology  Diagnosis:   ICD-10-CM   1. PTSD (post-traumatic stress disorder)  F43.10       Plan:  Patient is to use CBT and coping skills to decrease anger responses.  Patient is to continue working on healing his body.  Patient is to think about different career options that may feel better to him with his current situation.  Patient is to watch podcast by Dr. Tawanna Solo concerning over explaining and trauma.  Patient is to take medication as directed Long-term goal: display a full range of emotions without experiencing loss of control Short-term goal: Practice implement relaxation training as a coping mechanism for tension panic stress anger and anxiety  Lina Sayre, [facility]

## 2022-02-28 NOTE — Progress Notes (Signed)
Subjective:  Patient ID: Shawn York, male    DOB: 1976-02-21  Age: 46 y.o. MRN: [id number]  CC: Anemia and Hypertension   HPI Anakin Nolette presents for f/up -   He is concerned about the ascending thoracic aortic aneurysm.  He has had some recent episodes of dyspnea on exertion, chest pain, and headaches.  He is concerned his blood pressure is not adequately well controlled.  I have recommended a recheck of the aneurysm in 1 year but I think that he has some concerns that would be better addressed by a thoracic surgeon.  Outpatient Medications Prior to Visit  Medication Sig Dispense Refill   EPINEPHrine 0.3 mg/0.3 mL IJ SOAJ injection Inject 0.3 mg into the muscle once as needed (for a severe, allergic reaction).      nebivolol (BYSTOLIC) 5 MG tablet Take 1 tablet by mouth daily.     Oxcarbazepine (TRILEPTAL) 300 MG tablet Take 1 tablet (300 mg total) by mouth 2 (two) times daily. 60 tablet 0   thiamine 100 MG tablet Take 1 tablet (100 mg total) by mouth daily. 90 tablet 1   vortioxetine HBr (TRINTELLIX) 10 MG TABS tablet Take 1 tablet (10 mg total) by mouth daily. 90 tablet 0   methocarbamol (ROBAXIN) 500 MG tablet Take 1 tablet (500 mg total) by mouth 2 (two) times daily as needed. 20 tablet 0   ondansetron (ZOFRAN) 4 MG tablet Take 1 tablet (4 mg total) by mouth every 8 (eight) hours as needed for nausea or vomiting. 40 tablet 0   oxyCODONE-acetaminophen (PERCOCET) 5-325 MG tablet Take 1-2 tablets by mouth every 6 (six) hours as needed. 40 tablet 0   busPIRone (BUSPAR) 30 MG tablet Take 1 tablet (30 mg total) by mouth 2 (two) times daily. 180 tablet 1   diphenhydrAMINE (BENADRYL ALLERGY) 25 MG tablet Take 50 mg one hour prior to CT scan 2 tablet 0   docusate sodium (COLACE) 100 MG capsule Take 1 capsule (100 mg total) by mouth daily as needed. 30 capsule 2   enoxaparin (LOVENOX) 40 MG/0.4ML injection Inject 0.4 mLs (40 mg total) into the skin daily for 28 doses. 11.2 mL 0   No  facility-administered medications prior to visit.    ROS Review of Systems  Constitutional: Negative.  Negative for diaphoresis and fatigue.  HENT: Negative.    Respiratory:  Positive for shortness of breath. Negative for chest tightness and wheezing.   Cardiovascular:  Positive for chest pain. Negative for palpitations and leg swelling.  Gastrointestinal:  Negative for abdominal pain, diarrhea, nausea and vomiting.  Endocrine: Negative.   Genitourinary: Negative.  Negative for difficulty urinating.  Musculoskeletal: Negative.   Skin: Negative.   Neurological:  Positive for headaches. Negative for dizziness, weakness and light-headedness.  Hematological:  Negative for adenopathy. Does not bruise/bleed easily.  Psychiatric/Behavioral: Negative.      Objective:  BP 128/88 (BP Location: Right Arm, Patient Position: Sitting, Cuff Size: Large)   Pulse 62   Temp 98.1 F (36.7 C) (Oral)   Resp 16   Ht 6\' 1"  (1.854 m)   Wt 198 lb (89.8 kg)   SpO2 99%   BMI 26.12 kg/m   BP Readings from Last 3 Encounters:  02/28/22 128/88  12/22/21 119/86  12/15/21 121/76    Wt Readings from Last 3 Encounters:  02/28/22 198 lb (89.8 kg)  12/21/21 200 lb (90.7 kg)  12/15/21 202 lb 12.8 oz (92 kg)    Physical Exam Vitals reviewed.  Constitutional:  Appearance: Normal appearance.  HENT:     Nose: Nose normal.     Mouth/Throat:     Mouth: Mucous membranes are moist.  Eyes:     General: No scleral icterus.    Conjunctiva/sclera: Conjunctivae normal.  Cardiovascular:     Rate and Rhythm: Normal rate and regular rhythm.     Heart sounds: No murmur heard.    Comments: EKG- NSR, 60 bpm No ST/T wave changes, LVH, or Q waves Pulmonary:     Effort: Pulmonary effort is normal.     Breath sounds: No stridor. No wheezing, rhonchi or rales.  Abdominal:     General: Abdomen is flat.     Palpations: There is no mass.     Tenderness: There is no abdominal tenderness. There is no guarding.      Hernia: No hernia is present.  Musculoskeletal:        General: No swelling.     Cervical back: Neck supple.     Right lower leg: No edema.     Left lower leg: No edema.  Lymphadenopathy:     Cervical: No cervical adenopathy.  Skin:    General: Skin is warm and dry.  Neurological:     General: No focal deficit present.     Mental Status: He is alert.  Psychiatric:        Mood and Affect: Mood normal.        Behavior: Behavior normal.     Lab Results  Component Value Date   WBC 6.0 02/28/2022   HGB 14.6 02/28/2022   HCT 42.9 02/28/2022   PLT 187.0 02/28/2022   GLUCOSE 90 12/15/2021   CHOL 213 (H) 05/17/2021   TRIG 271.0 (H) 05/17/2021   HDL 53.60 05/17/2021   LDLDIRECT 127.0 05/17/2021   ALT 19 12/15/2021   AST 15 12/15/2021   NA 136 12/15/2021   K 3.9 12/15/2021   CL 106 12/15/2021   CREATININE 1.03 12/15/2021   BUN 20 12/15/2021   CO2 26 12/15/2021   TSH 1.37 05/17/2021   INR 1.0 12/15/2021    CT ANGIO CHEST AORTA W/CM & OR WO/CM  Result Date: 02/14/2022 CLINICAL DATA:  Suspected aneurysm of aorta. EXAM: CT ANGIOGRAPHY CHEST WITH CONTRAST TECHNIQUE: Multidetector CT imaging of the chest was performed using the standard protocol during bolus administration of intravenous contrast. Multiplanar CT image reconstructions and MIPs were obtained to evaluate the vascular anatomy. RADIATION DOSE REDUCTION: This exam was performed according to the departmental dose-optimization program which includes automated exposure control, adjustment of the mA and/or kV according to patient size and/or use of iterative reconstruction technique. CONTRAST:  OMNIPAQUE IOHEXOL 350 MG/ML SOLN COMPARISON:  None Available. FINDINGS: Cardiovascular: Satisfactory opacification of the pulmonary arteries to the segmental level. No evidence of pulmonary embolism. Normal heart size. No pericardial effusion. The ascending thoracic aorta measures 4.02 x 4.1 cm. The aortic arch measures 2.6 cm in  diameter, the descending aorta measures 2.4 cm in diameter. Mediastinum/Nodes: No enlarged mediastinal, hilar, or axillary lymph nodes. Thyroid gland, trachea, and esophagus demonstrate no significant findings. Lungs/Pleura: Lungs are clear. No pleural effusion or pneumothorax. Upper Abdomen: No acute abnormality. Musculoskeletal: Small bone island is identified in a upper to midthoracic vertebral body. Minimal degenerative joint changes of the spine are noted. Review of the MIP images confirms the above findings. IMPRESSION: 1. Ascending thoracic aortic aneurysm measuring 4.02 x 4.1 cm. Recommend annual imaging followup by CTA or MRA. This recommendation follows 2010 ACCF/AHA/AATS/ACR/ASA/SCA/SCAI/SIR/STS/SVM Guidelines  for the Diagnosis and Management of Patients with Thoracic Aortic Disease. Circulation. 2010; 121: W098-J191. Aortic aneurysm NOS (ICD10-I71.9) 2. No pulmonary embolus. Electronically Signed   By: Sherian Rein M.D.   On: 02/14/2022 12:47    Assessment & Plan:   Rudis was seen today for anemia and hypertension.  Diagnoses and all orders for this visit:  Primary hypertension- His blood pressure is well controlled.  His EKG is reassuring. -     CBC with Differential/Platelet; Future -     EKG 12-Lead -     CBC with Differential/Platelet  Anemia due to acquired thiamine deficiency- His H&H are normal now. -     CBC with Differential/Platelet; Future -     CBC with Differential/Platelet  Aneurysm of ascending aorta without rupture (HCC) -     Ambulatory referral to Cardiothoracic Surgery   I have discontinued Taurus Sabia's ondansetron, methocarbamol, docusate sodium, enoxaparin, oxyCODONE-acetaminophen, and diphenhydrAMINE. I am also having him maintain his EPINEPHrine, Oxcarbazepine, busPIRone, thiamine, nebivolol, and vortioxetine HBr.  No orders of the defined types were placed in this encounter.    Follow-up: No follow-ups on file.  Sanda Linger, MD

## 2022-03-01 ENCOUNTER — Encounter: Payer: Self-pay | Admitting: Internal Medicine

## 2022-03-21 DIAGNOSIS — H9311 Tinnitus, right ear: Secondary | ICD-10-CM | POA: Diagnosis not present

## 2022-03-21 DIAGNOSIS — H9041 Sensorineural hearing loss, unilateral, right ear, with unrestricted hearing on the contralateral side: Secondary | ICD-10-CM | POA: Diagnosis not present

## 2022-03-24 ENCOUNTER — Other Ambulatory Visit: Payer: Self-pay | Admitting: Internal Medicine

## 2022-03-24 DIAGNOSIS — J301 Allergic rhinitis due to pollen: Secondary | ICD-10-CM

## 2022-03-30 NOTE — Patient Instructions (Signed)
Make every effort to maintain a "heart-healthy" lifestyle with regular physical exercise and adherence to a low-fat, low-carbohydrate diet.  Continue to seek regular follow-up appointments with your primary care physician and/or cardiologist.  Avoid Ciprofloxacin which can increase your risk of aortic dissection

## 2022-03-30 NOTE — Progress Notes (Unsigned)
      [address]       [place] [address]             [phone]      Shawn York [id number] Jun 29, 1976  History of Present Illness:  Shawn York is a 46 yo male with history of HTN, Brain Cancer (2012), anemia related to thiamine deficiency, and Ascending Aortic Dissection measuring 4.0 x 4.1 cm.      Current Outpatient Medications on File Prior to Visit  Medication Sig Dispense Refill   busPIRone (BUSPAR) 30 MG tablet Take 1 tablet (30 mg total) by mouth 2 (two) times daily. 180 tablet 1   EPINEPHrine 0.3 mg/0.3 mL IJ SOAJ injection Inject 0.3 mg into the muscle once as needed (for a severe, allergic reaction).      nebivolol (BYSTOLIC) 5 MG tablet Take 1 tablet by mouth daily.     Oxcarbazepine (TRILEPTAL) 300 MG tablet Take 1 tablet (300 mg total) by mouth 2 (two) times daily. 60 tablet 0   thiamine 100 MG tablet Take 1 tablet (100 mg total) by mouth daily. 90 tablet 1   vortioxetine HBr (TRINTELLIX) 10 MG TABS tablet Take 1 tablet (10 mg total) by mouth daily. 90 tablet 0   No current facility-administered medications on file prior to visit.     There were no vitals taken for this visit.  Physical Exam  CTA Results:  FINDINGS: Cardiovascular: Satisfactory opacification of the pulmonary arteries to the segmental level. No evidence of pulmonary embolism. Normal heart size. No pericardial effusion. The ascending thoracic aorta measures 4.02 x 4.1 cm. The aortic arch measures 2.6 cm in diameter, the descending aorta measures 2.4 cm in diameter.   Mediastinum/Nodes: No enlarged mediastinal, hilar, or axillary lymph nodes. Thyroid gland, trachea, and esophagus demonstrate no significant findings.   Lungs/Pleura: Lungs are clear. No pleural effusion or pneumothorax.   Upper Abdomen: No acute abnormality.   Musculoskeletal: Small bone island is identified in a upper to midthoracic vertebral body. Minimal degenerative joint changes of the spine  are noted.   Review of the MIP images confirms the above findings.   IMPRESSION: 1. Ascending thoracic aortic aneurysm measuring 4.02 x 4.1 cm. Recommend annual imaging followup by CTA or MRA. This recommendation follows 2010 ACCF/AHA/AATS/ACR/ASA/SCA/SCAI/SIR/STS/SVM Guidelines for the Diagnosis and Management of Patients with Thoracic Aortic Disease. Circulation. 2010; 121: M226-J335. Aortic aneurysm NOS (ICD10-I71.9) 2. No pulmonary embolus.     Electronically Signed   By: Shawn York M.D.   On: 02/14/2022 12:47    A/P:  Ascending Aortic Aneurysm measuring 4 cm, this is the upper limits of normal HTN Anemia due to thiamine deficiency    Risk Modification:  Statin:  ***  Smoking cessation instruction/counseling given:  {CHL AMB [facility] SMOKING CESSATION COUNSELING:20758}  Patient was counseled on importance of Blood Pressure Control.  Despite Medical intervention if the patient notices persistently elevated blood pressure readings.  They are instructed to contact their Primary Care Physician  Please avoid use of Fluoroquinolones as this can potentially increase your risk of Aortic Rupture and/or Dissection  Patient educated on signs and symptoms of Aortic Dissection, handout also provided in AVS  Shawn Corsetti, PA-C 03/30/22

## 2022-03-31 ENCOUNTER — Other Ambulatory Visit: Payer: Self-pay | Admitting: Internal Medicine

## 2022-03-31 ENCOUNTER — Institutional Professional Consult (permissible substitution) (INDEPENDENT_AMBULATORY_CARE_PROVIDER_SITE_OTHER): Payer: BC Managed Care – PPO | Admitting: Physician Assistant

## 2022-03-31 VITALS — BP 133/80 | HR 60 | Resp 20 | Ht 73.0 in | Wt 198.0 lb

## 2022-03-31 DIAGNOSIS — I7121 Aneurysm of the ascending aorta, without rupture: Secondary | ICD-10-CM

## 2022-04-01 ENCOUNTER — Other Ambulatory Visit: Payer: Self-pay

## 2022-04-01 MED ORDER — NEBIVOLOL HCL 5 MG PO TABS
5.0000 mg | ORAL_TABLET | Freq: Every day | ORAL | 0 refills | Status: DC
  Filled 2022-04-01: qty 90, 90d supply, fill #0

## 2022-04-03 ENCOUNTER — Other Ambulatory Visit: Payer: Self-pay | Admitting: Internal Medicine

## 2022-04-03 DIAGNOSIS — I10 Essential (primary) hypertension: Secondary | ICD-10-CM

## 2022-04-13 DIAGNOSIS — C712 Malignant neoplasm of temporal lobe: Secondary | ICD-10-CM | POA: Diagnosis not present

## 2022-04-13 DIAGNOSIS — Z923 Personal history of irradiation: Secondary | ICD-10-CM | POA: Diagnosis not present

## 2022-05-11 DIAGNOSIS — C712 Malignant neoplasm of temporal lobe: Secondary | ICD-10-CM | POA: Diagnosis not present

## 2022-05-11 DIAGNOSIS — Z85841 Personal history of malignant neoplasm of brain: Secondary | ICD-10-CM | POA: Diagnosis not present

## 2022-05-11 DIAGNOSIS — D499 Neoplasm of unspecified behavior of unspecified site: Secondary | ICD-10-CM | POA: Diagnosis not present

## 2022-05-17 ENCOUNTER — Encounter: Payer: Self-pay | Admitting: Internal Medicine

## 2022-05-30 ENCOUNTER — Other Ambulatory Visit: Payer: Self-pay | Admitting: Internal Medicine

## 2022-05-30 DIAGNOSIS — R9089 Other abnormal findings on diagnostic imaging of central nervous system: Secondary | ICD-10-CM | POA: Insufficient documentation

## 2022-06-19 ENCOUNTER — Other Ambulatory Visit: Payer: Self-pay | Admitting: Internal Medicine

## 2022-06-19 DIAGNOSIS — J301 Allergic rhinitis due to pollen: Secondary | ICD-10-CM

## 2022-06-30 ENCOUNTER — Other Ambulatory Visit: Payer: Self-pay | Admitting: Internal Medicine

## 2022-06-30 DIAGNOSIS — I10 Essential (primary) hypertension: Secondary | ICD-10-CM

## 2022-07-04 DIAGNOSIS — F4310 Post-traumatic stress disorder, unspecified: Secondary | ICD-10-CM | POA: Diagnosis not present

## 2022-07-04 DIAGNOSIS — F329 Major depressive disorder, single episode, unspecified: Secondary | ICD-10-CM | POA: Diagnosis not present

## 2022-07-08 ENCOUNTER — Ambulatory Visit (INDEPENDENT_AMBULATORY_CARE_PROVIDER_SITE_OTHER): Payer: BC Managed Care – PPO | Admitting: Psychiatry

## 2022-07-08 ENCOUNTER — Encounter: Payer: Self-pay | Admitting: Psychiatry

## 2022-07-08 VITALS — BP 118/75 | HR 62

## 2022-07-08 DIAGNOSIS — F330 Major depressive disorder, recurrent, mild: Secondary | ICD-10-CM | POA: Diagnosis not present

## 2022-07-08 DIAGNOSIS — F515 Nightmare disorder: Secondary | ICD-10-CM

## 2022-07-08 DIAGNOSIS — F4310 Post-traumatic stress disorder, unspecified: Secondary | ICD-10-CM | POA: Diagnosis not present

## 2022-07-08 MED ORDER — BUSPIRONE HCL 30 MG PO TABS: 30.0000 mg | ORAL_TABLET | Freq: Two times a day (BID) | ORAL | 1 refills | Status: DC

## 2022-07-08 MED ORDER — VILAZODONE HCL 10 MG PO TABS: ORAL_TABLET | ORAL | 1 refills | Status: DC

## 2022-07-08 MED ORDER — DOXAZOSIN MESYLATE 4 MG PO TABS: 4.0000 mg | ORAL_TABLET | Freq: Every day | ORAL | 1 refills | Status: DC

## 2022-07-08 NOTE — Progress Notes (Signed)
Shawn York [id number] 19-Oct-1975 46 y.o.  Subjective:   Patient ID:  Shawn York is a 46 y.o. (DOB 1976/03/20) male.  Chief Complaint:  Chief Complaint  Patient presents with   Anxiety   Depression    HPI Shawn York presents to the office today for follow-up of depression, anxiety, and insomnia.   He reports that he is no longer taking Trintellix any longer due to nausea and indigestion. He was prescribed it again in January/February by PCP and experienced the same side effects.   Brother died in 08/12/23 and he decided to have some tests completed and he was dx'd with Ascending Aortic Aneurysm. In March he had   He had a fusion in November and a hip replaced in April. He had a nosebleed for 3 weeks and had an embolization in the spring. He is now having brain scans every 6 months- "because my brain cancer is back." He had been told that he had a CVA during one of the surgeries.   Denies night terrors. "I'm still anxious... I don't feel like I am on edge constantly." Denies panic attacks. He reports that he continues to have intrusive memories. He reports that he has been having flashbacks more since brother died.   He reports constant, moderate depressed mood. He reports that he feels as if he is "in a fog all day long." He reports difficulty getting up and getting going in the morning. He reports difficulty gathering his thoughts. He reports impaired concentration. Energy is low- "I do what I have to do" and goes to work. Motivation is low. He reports that his children make him laugh and he is able to enjoy them. Denies diminished interest. Denies SI.   Children will soon be 43 yo and 30 yo.   He reports that he started Doxazosin about 2 weeks ago and his nightmares and sleep has improved.   He has been seeing a new therapist with the [facility].   Past Psychiatric Medication Trials: Trintellix Viibryd Rexulti Trileptal Buspar Remeron Zoloft- Sexual side effects.  "Took the edge off a little bit."  Hydroxyzine- Drowsiness Dayvigo-excessive somnolence Doxazosin Hydroxyzine- had withdrawal  PHQ2-9    [place] Visit from 02/28/2022 in [place] at [facility] Visit from 10/12/2021 in [place] at [facility] Visit from 03/18/2021 in [facility] [place] Visit from 02/18/2021 in [place] at [place] Visit from 06/02/2020 in [place] at [place]  PHQ-2 Total Score '2 2 2 3 '$ 0  PHQ-9 Total Score '11 4 5 12 '$ 0      Flowsheet Row Pre-Admission Testing 60 from 12/14/2021 in [facility] PREADMISSION TESTING ED from 11/21/2021 in [place] ED to Hosp-Admission (Discharged) from 11/15/2021 in [place] No Risk No Risk No Risk        Review of Systems:  Review of Systems  HENT:  Positive for tinnitus.   Gastrointestinal:  Negative for diarrhea and nausea.       Indigestion  Musculoskeletal:  Negative for gait problem.  Neurological:  Negative for tremors.       Occ headache  Psychiatric/Behavioral:         Please refer to HPI    Medications: I have reviewed the patient's current medications.  Current Outpatient Medications  Medication Sig Dispense Refill   b complex vitamins  capsule Take 1 capsule by mouth daily.     nebivolol (BYSTOLIC) 5 MG tablet TAKE 1 TABLET (5 MG TOTAL) BY MOUTH DAILY. 90 tablet 0   Oxcarbazepine (TRILEPTAL) 300 MG tablet Take 1 tablet (300 mg total) by mouth 2 (two) times daily. 60 tablet 0   thiamine 100 MG tablet Take 1 tablet (100 mg total) by mouth daily. 90 tablet 1   busPIRone (BUSPAR) 30 MG tablet Take 1 tablet (30 mg total) by mouth 2 (two) times daily. 180 tablet 1   doxazosin (CARDURA) 4 MG tablet Take 1 tablet (4 mg total) by mouth at bedtime. 30 tablet 1   EPINEPHrine 0.3 mg/0.3 mL IJ  SOAJ injection Inject 0.3 mg into the muscle once as needed (for a severe, allergic reaction).      Vilazodone HCl (VIIBRYD) 10 MG TABS Take 1/2 tablet daily with breakfast for one week, then increase 1 tablet daily with breakfast 30 tablet 1   No current facility-administered medications for this visit.    Medication Side Effects: None  Allergies:  Allergies  Allergen Reactions   Iodinated Contrast Media Rash and Anaphylaxis   Penicillins Shortness Of Breath and Rash    Did it involve swelling of the face/tongue/throat, SOB, or low BP? Yes Did it involve sudden or severe rash/hives, skin peeling, or any reaction on the inside of your mouth or nose? No Did you need to seek medical attention at a hospital or doctor's office? No When did it last happen? Unk If all above answers are "NO", may proceed with cephalosporin use.     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   Please see review of systems for further details on the patient's review from today.   Objective:   Physical Exam:  BP 118/75   Pulse 62   Physical Exam Constitutional:      General: He is not in acute distress. Musculoskeletal:        General: No deformity.  Neurological:     Mental Status: He is alert and oriented to person, place, and time.     Coordination: Coordination normal.  Psychiatric:        Attention and Perception: Attention and perception normal. He does not perceive auditory or visual hallucinations.        Mood and Affect: Mood is anxious and depressed.  Affect is not labile, blunt, angry or inappropriate.        Speech: Speech normal.        Behavior: Behavior normal.        Thought Content: Thought content normal. Thought content is not paranoid or delusional. Thought content does not include homicidal or suicidal ideation. Thought content does not include homicidal or suicidal plan.        Cognition and Memory: Cognition and memory normal.        Judgment: Judgment normal.     Comments: Insight intact     Lab Review:     Component Value Date/Time   NA 136  12/15/2021 1522   NA 141 06/21/2013 1505   K 3.9 12/15/2021 1522   K 4.2 06/21/2013 1505   CL 106 12/15/2021 1522   CO2 26 12/15/2021 1522   CO2 25 06/21/2013 1505   GLUCOSE 90 12/15/2021 1522   GLUCOSE 83 06/21/2013 1505   BUN 20 12/15/2021 1522   BUN 18.1 06/21/2013 1505   CREATININE 1.03 12/15/2021 1522   CREATININE 0.9 06/21/2013 1505   CALCIUM 8.9 12/15/2021 1522   CALCIUM 9.1 06/21/2013 1505   PROT 7.1 12/15/2021 1522   PROT 7.0 06/21/2013 1505   ALBUMIN 4.4 12/15/2021 1522   ALBUMIN 4.1 06/21/2013 1505   AST 15 12/15/2021 1522   AST 15 06/21/2013 1505   ALT 19 12/15/2021 1522   ALT 13 06/21/2013 1505   ALKPHOS 42 12/15/2021 1522   ALKPHOS 59 06/21/2013 1505   BILITOT 0.3 12/15/2021 1522   BILITOT 0.33 06/21/2013 1505   GFRNONAA >60 12/15/2021 1522   GFRAA >60 04/30/2020 1533       Component Value Date/Time   WBC 6.0 02/28/2022 1501   RBC 4.61 02/28/2022 1501   HGB 14.6 02/28/2022 1501   HGB 11.6 (L) 12/15/2021 1522   HGB 13.3 06/21/2013 1505   HCT 42.9 02/28/2022 1501   HCT 38.7 06/21/2013 1505   PLT 187.0 02/28/2022 1501   PLT 145 (L) 12/15/2021 1522   PLT 154 06/21/2013 1505   MCV 93.0 02/28/2022 1501   MCV 99.0 (H) 06/21/2013 1505   MCH 32.7 12/22/2021 0635   MCHC 34.1 02/28/2022 1501   RDW 14.4 02/28/2022 1501   RDW 13.3 06/21/2013 1505   LYMPHSABS 2.2 02/28/2022 1501   LYMPHSABS 1.1 06/21/2013 1505   MONOABS 0.5 02/28/2022 1501    MONOABS 0.5 06/21/2013 1505   EOSABS 0.1 02/28/2022 1501   EOSABS 0.1 06/21/2013 1505   BASOSABS 0.0 02/28/2022 1501   BASOSABS 0.0 06/21/2013 1505    No results found for: "POCLITH", "LITHIUM"   No results found for: "PHENYTOIN", "PHENOBARB", "VALPROATE", "CBMZ"   .res Assessment: Plan:    Pt seen for 30 minutes and time spent discussing treatment options as an alternative to Trintellix since he reports that Trintellix was minimally effective and causing side effects. Discussed potential benefits, risks, and side effects of possible treatment options to include Viibryd, Prozac, or Lexapro. Will start Viibryd 5 mg po qf for one week, then increase to 10 mg daily with food for anxiety and depression. Pt reports that he would like to continue Doxazosin 4 mg po QHS since this is helpful for his sleep and night terrors.  Continue Buspar 30 mg po BID for anxiety.  Pt to follow-up with this provider in 5 weeks or sooner if clinically indicated.  Patient advised to contact office with any questions, adverse effects, or acute worsening in signs and symptoms.   Eathon was seen today for anxiety and depression.  Diagnoses and all orders for this visit:  PTSD (post-traumatic stress disorder) -     Vilazodone HCl (VIIBRYD) 10 MG TABS; Take 1/2 tablet daily with breakfast for one week, then increase 1 tablet daily with breakfast -     busPIRone (BUSPAR) 30 MG tablet; Take 1 tablet (30 mg total) by mouth 2 (two) times daily. -     doxazosin (CARDURA) 4 MG tablet; Take 1 tablet (4 mg total) by mouth at bedtime.  MDD (major depressive disorder), recurrent episode, mild (HCC) -     Vilazodone HCl (VIIBRYD) 10 MG TABS; Take 1/2  tablet daily with breakfast for one week, then increase 1 tablet daily with breakfast  Nightmares -     doxazosin (CARDURA) 4 MG tablet; Take 1 tablet (4 mg total) by mouth at bedtime.     Please see After Visit Summary for patient specific instructions.  Future  Appointments  Date Time Provider [place]  08/12/2022 12:45 PM Thayer Headings, PMHNP CP-CP None    No orders of the defined types were placed in this encounter.   -------------------------------

## 2022-07-18 DIAGNOSIS — F329 Major depressive disorder, single episode, unspecified: Secondary | ICD-10-CM | POA: Diagnosis not present

## 2022-07-18 DIAGNOSIS — F4310 Post-traumatic stress disorder, unspecified: Secondary | ICD-10-CM | POA: Diagnosis not present

## 2022-08-01 DIAGNOSIS — F4310 Post-traumatic stress disorder, unspecified: Secondary | ICD-10-CM | POA: Diagnosis not present

## 2022-08-01 DIAGNOSIS — F329 Major depressive disorder, single episode, unspecified: Secondary | ICD-10-CM | POA: Diagnosis not present

## 2022-08-12 ENCOUNTER — Encounter: Payer: Self-pay | Admitting: Psychiatry

## 2022-08-12 ENCOUNTER — Ambulatory Visit (INDEPENDENT_AMBULATORY_CARE_PROVIDER_SITE_OTHER): Payer: BC Managed Care – PPO | Admitting: Psychiatry

## 2022-08-12 DIAGNOSIS — F4310 Post-traumatic stress disorder, unspecified: Secondary | ICD-10-CM

## 2022-08-12 DIAGNOSIS — F330 Major depressive disorder, recurrent, mild: Secondary | ICD-10-CM | POA: Diagnosis not present

## 2022-08-12 DIAGNOSIS — F515 Nightmare disorder: Secondary | ICD-10-CM | POA: Diagnosis not present

## 2022-08-12 MED ORDER — DOXAZOSIN MESYLATE 4 MG PO TABS: 4.0000 mg | ORAL_TABLET | Freq: Every day | ORAL | 1 refills | Status: DC

## 2022-08-12 MED ORDER — FLUOXETINE HCL 10 MG PO CAPS: ORAL_CAPSULE | ORAL | 2 refills | Status: DC
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
## 2022-08-15 DIAGNOSIS — F4310 Post-traumatic stress disorder, unspecified: Secondary | ICD-10-CM | POA: Diagnosis not present

## 2022-08-15 DIAGNOSIS — F329 Major depressive disorder, single episode, unspecified: Secondary | ICD-10-CM | POA: Diagnosis not present

## 2022-09-05 DIAGNOSIS — F4310 Post-traumatic stress disorder, unspecified: Secondary | ICD-10-CM | POA: Diagnosis not present

## 2022-09-05 DIAGNOSIS — F329 Major depressive disorder, single episode, unspecified: Secondary | ICD-10-CM | POA: Diagnosis not present

## 2022-09-19 DIAGNOSIS — F329 Major depressive disorder, single episode, unspecified: Secondary | ICD-10-CM | POA: Diagnosis not present

## 2022-09-19 DIAGNOSIS — F4310 Post-traumatic stress disorder, unspecified: Secondary | ICD-10-CM | POA: Diagnosis not present

## 2022-09-27 ENCOUNTER — Other Ambulatory Visit: Payer: Self-pay | Admitting: Internal Medicine

## 2022-09-27 DIAGNOSIS — I10 Essential (primary) hypertension: Secondary | ICD-10-CM

## 2022-09-29 ENCOUNTER — Encounter: Payer: Self-pay | Admitting: Internal Medicine

## 2022-10-03 DIAGNOSIS — F4310 Post-traumatic stress disorder, unspecified: Secondary | ICD-10-CM | POA: Diagnosis not present

## 2022-10-03 DIAGNOSIS — F329 Major depressive disorder, single episode, unspecified: Secondary | ICD-10-CM | POA: Diagnosis not present

## 2022-10-10 ENCOUNTER — Ambulatory Visit (INDEPENDENT_AMBULATORY_CARE_PROVIDER_SITE_OTHER): Payer: BC Managed Care – PPO | Admitting: Psychiatry

## 2022-10-10 ENCOUNTER — Encounter: Payer: Self-pay | Admitting: Psychiatry

## 2022-10-10 DIAGNOSIS — F515 Nightmare disorder: Secondary | ICD-10-CM | POA: Diagnosis not present

## 2022-10-10 DIAGNOSIS — F330 Major depressive disorder, recurrent, mild: Secondary | ICD-10-CM | POA: Diagnosis not present

## 2022-10-10 DIAGNOSIS — F4310 Post-traumatic stress disorder, unspecified: Secondary | ICD-10-CM | POA: Diagnosis not present

## 2022-10-10 MED ORDER — BUSPIRONE HCL 30 MG PO TABS: 30.0000 mg | ORAL_TABLET | Freq: Two times a day (BID) | ORAL | 1 refills | Status: DC

## 2022-10-10 MED ORDER — FLUOXETINE HCL 20 MG PO CAPS: ORAL_CAPSULE | ORAL | 2 refills | Status: DC

## 2022-10-10 MED ORDER — DOXAZOSIN MESYLATE 4 MG PO TABS: 4.0000 mg | ORAL_TABLET | Freq: Every day | ORAL | 1 refills | Status: DC

## 2022-10-10 NOTE — Progress Notes (Signed)
Merit Herpel [id number] May 05, 1976 47 y.o.  Subjective:   Patient ID:  Shawn York is a 47 y.o. (DOB [birth year]) male.  Chief Complaint:  Chief Complaint  Patient presents with   Anxiety   Depression    HPI Shawn York presents to the office today for follow-up of anxiety, depression, and insomnia. Shawn York reports that Shawn York tolerated Prozac without difficulty. Shawn York reports that Shawn York ran out of Prozac a couple of days ago. Shawn York reports that Shawn York not certain about the benefit of Prozac.  Shawn York notices dissociation and is unclear how long this occurs. Occasionally forgets things and will set alarms for things, like when Shawn York needs to pick up his children.  Shawn York reports that his hearing loss also prevents him from becoming re-grounded. Shawn York reports dissociation without recalling flashbacks. Shawn York reports that Shawn York continues to have flashbacks and intrusive memories. Shawn York describes his mood as "not particularly negative... blase." Shawn York reports less negative mood with Prozac. Continued irritability. Shawn York reports that Shawn York continues to procrastinate. Shawn York reports energy and motivation remain low. Sleep has been "ok." Has implemented a nightly routine to help with sleep hygiene. Estimates sleeping about 6 hours a night. Shawn York continues to have strange dreams and nightmares. Appetite has been fine. Shawn York reports that Shawn York is easily distracted. Shawn York reports occ fleeting passive thoughts of suicide- "I don't entertain them." Denies suicidal intent or plan.  Shawn York reports that his wife has mentioned that she seems, "not present." Shawn York reports occasionally making mistakes at work due to mild dissociation. "I would like to be more of an active participant about where my life goes."   Shawn York has been in school. Re-took a class that Shawn York had trouble with before and had to withdrawal due to getting behind in class.   Working with therapist on self-awareness.   Past Psychiatric Medication Trials: Trintellix Viibryd Rexulti Trileptal Buspar Remeron Zoloft- Sexual  side effects. "Took the edge off a little bit."  Prozac Viibryd- Severe GI side effects Hydroxyzine- Drowsiness Dayvigo-excessive somnolence Doxazosin Hydroxyzine- had withdrawal   PHQ2-9    [place] Office Visit from 02/28/2022 in [place] at [facility] Visit from 10/12/2021 in [place] at [place] Visit from 03/18/2021 in [facility] [place] Visit from 02/18/2021 in [facility] at [place] Visit from 06/02/2020 in [place] at [facility]  PHQ-2 Total Score 2 2 2 3 $ 0  PHQ-9 Total Score 11 4 5 12 $ 0      Flowsheet Row Pre-Admission Testing 60 from 12/14/2021 in [facility] PREADMISSION TESTING ED from 11/21/2021 in [facility] Emergency Department at [facility] ED to Hosp-Admission (Discharged) from 11/15/2021 in [place] No Risk No Risk No Risk        Review of Systems:  Review of Systems  HENT:  Positive for hearing loss.   Musculoskeletal:  Negative for gait problem.  Neurological:        Has apt with neurology in March.   Psychiatric/Behavioral:         Please refer to HPI    Medications: I have reviewed the patient's current medications.  Current Outpatient Medications  Medication Sig Dispense Refill   b complex vitamins capsule Take 1 capsule by mouth daily.     busPIRone (BUSPAR) 30 MG tablet Take 1 tablet (30 mg total) by mouth 2 (  two) times daily. 180 tablet 1   doxazosin (CARDURA) 4 MG tablet Take 1 tablet (4 mg total) by mouth at bedtime. 90 tablet 1   EPINEPHrine 0.3 mg/0.3 mL IJ SOAJ injection Inject 0.3 mg into the muscle once as needed (for a severe, allergic reaction).      FLUoxetine (PROZAC) 20 MG capsule Take 1 capsule daily for 1 week, then increase to 2 capsules daily 60 capsule 2   levocetirizine (XYZAL) 5 MG tablet  Take 5 mg by mouth every evening.     nebivolol (BYSTOLIC) 5 MG tablet TAKE 1 TABLET (5 MG TOTAL) BY MOUTH DAILY. 90 tablet 0   Oxcarbazepine (TRILEPTAL) 300 MG tablet Take 1 tablet (300 mg total) by mouth 2 (two) times daily. 60 tablet 0   thiamine 100 MG tablet Take 1 tablet (100 mg total) by mouth daily. 90 tablet 1   No current facility-administered medications for this visit.    Medication Side Effects: None  Allergies:  Allergies  Allergen Reactions   Iodinated Contrast Media Rash and Anaphylaxis   Penicillins Shortness Of Breath and Rash    Did it involve swelling of the face/tongue/throat, SOB, or low BP? Yes Did it involve sudden or severe rash/hives, skin peeling, or any reaction on the inside of your mouth or nose? No Did you need to seek medical attention at a hospital or doctor's office? No When did it last happen? Unk If all above answers are "NO", may proceed with cephalosporin use.     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   Please see review of systems for further details on the patient's review from today.   Objective:   Physical Exam:  There were no vitals taken for this visit.  Physical Exam Constitutional:      General: Shawn York is not in acute distress. Musculoskeletal:        General: No deformity.  Neurological:     Mental Status: Shawn York is alert and oriented to person, place, and time.     Coordination: Coordination normal.  Psychiatric:        Attention and Perception: Attention and perception normal. Shawn York does not  perceive auditory or visual hallucinations.        Mood and Affect: Mood is anxious. Affect is not labile, blunt, angry or inappropriate.        Speech: Speech normal.        Behavior: Behavior normal.        Thought Content: Thought content normal. Thought content is not paranoid or delusional. Thought content does not include homicidal or suicidal ideation. Thought content does not include homicidal or suicidal plan.        Cognition and Memory: Cognition and memory normal.        Judgment: Judgment normal.     Comments: Insight intact Dysthymic mood     Lab Review:     Component Value Date/Time   NA 136 12/15/2021 1522   NA 141 06/21/2013 1505   K 3.9  12/15/2021 1522   K 4.2 06/21/2013 1505   CL 106 12/15/2021 1522   CO2 26 12/15/2021 1522   CO2 25 06/21/2013 1505   GLUCOSE 90 12/15/2021 1522   GLUCOSE 83 06/21/2013 1505   BUN 20 12/15/2021 1522   BUN 18.1 06/21/2013 1505   CREATININE 1.03 12/15/2021 1522   CREATININE 0.9 06/21/2013 1505   CALCIUM 8.9 12/15/2021 1522   CALCIUM 9.1 06/21/2013 1505   PROT 7.1 12/15/2021 1522   PROT 7.0 06/21/2013 1505   ALBUMIN 4.4 12/15/2021 1522   ALBUMIN 4.1 06/21/2013 1505   AST 15 12/15/2021 1522   AST 15 06/21/2013 1505   ALT 19 12/15/2021 1522   ALT 13 06/21/2013 1505   ALKPHOS 42 12/15/2021 1522   ALKPHOS 59 06/21/2013 1505   BILITOT 0.3 12/15/2021 1522   BILITOT 0.33 06/21/2013 1505   GFRNONAA >60 12/15/2021 1522   GFRAA >60 04/30/2020 1533       Component Value Date/Time   WBC 6.0 02/28/2022 1501   RBC 4.61 02/28/2022 1501   HGB 14.6 02/28/2022 1501   HGB 11.6 (L) 12/15/2021 1522   HGB 13.3 06/21/2013 1505   HCT 42.9 02/28/2022 1501   HCT 38.7 06/21/2013 1505   PLT 187.0 02/28/2022 1501   PLT 145 (L) 12/15/2021 1522   PLT 154 06/21/2013 1505   MCV 93.0 02/28/2022 1501   MCV 99.0 (H) 06/21/2013 1505   MCH 32.7 12/22/2021 0635   MCHC 34.1 02/28/2022 1501   RDW 14.4 02/28/2022 1501   RDW 13.3 06/21/2013 1505    LYMPHSABS 2.2 02/28/2022 1501   LYMPHSABS 1.1 06/21/2013 1505   MONOABS 0.5 02/28/2022 1501   MONOABS 0.5 06/21/2013 1505   EOSABS 0.1 02/28/2022 1501   EOSABS 0.1 06/21/2013 1505   BASOSABS 0.0 02/28/2022 1501   BASOSABS 0.0 06/21/2013 1505    No results found for: "POCLITH", "LITHIUM"   No results found for: "PHENYTOIN", "PHENOBARB", "VALPROATE", "CBMZ"   .res Assessment: Plan:    Pt seen for 30 minutes and time spent discussing response to Prozac and treatment plan. Shawn York reports that Shawn York has tolerated Prozac better than other medications Shawn York has tried in the past for anxiety and depression, however Shawn York reports that Shawn York is unsure if there has been any significant improvement in symptoms. Mood presents as somewhat less depressed today with brighter affect compared to last exam. Discussed potential benefits, risks, and side effects of increasing Prozac to 40 mg daily. Pt is in agreement with increase in Prozac.  Will resume Prozac 20 mg daily for one week after lapse of several days, then increase to 40 mg daily for anxiety and depression.  Continue Buspar 30 mg po BID for anxiety.  Continue Doxazosin 4 mg po QHS for nightmares.  Recommend continuing psychotherapy.  Pt to follow-up with this provider in 6 weeks or sooner if clinically indicated.  Patient advised to contact office with any questions, adverse effects, or acute worsening in signs and symptoms.   Shawn York was seen today for anxiety and depression.  Diagnoses and all orders for this visit:  PTSD (post-traumatic stress disorder) -     FLUoxetine (PROZAC) 20 MG capsule; Take 1 capsule daily for 1 week, then increase to 2 capsules daily -     busPIRone (BUSPAR) 30 MG tablet; Take 1 tablet (30 mg total) by mouth 2 (two) times daily. -     doxazosin (CARDURA) 4 MG tablet; Take 1 tablet (4 mg total) by mouth at bedtime.  MDD (  major depressive disorder), recurrent episode, mild (HCC) -     FLUoxetine (PROZAC) 20 MG capsule; Take 1  capsule daily for 1 week, then increase to 2 capsules daily  Nightmares -     doxazosin (CARDURA) 4 MG tablet; Take 1 tablet (4 mg total) by mouth at bedtime.     Please see After Visit Summary for patient specific instructions.  Future Appointments  Date Time Provider [place]  11/02/2022  2:00 PM Dohmeier, Asencion Partridge, MD [facility] None  11/21/2022 11:30 AM Thayer Headings, PMHNP [facility] None    No orders of the defined types were placed in this encounter.   -------------------------------

## 2022-10-17 DIAGNOSIS — F4310 Post-traumatic stress disorder, unspecified: Secondary | ICD-10-CM | POA: Diagnosis not present

## 2022-10-17 DIAGNOSIS — F329 Major depressive disorder, single episode, unspecified: Secondary | ICD-10-CM | POA: Diagnosis not present

## 2022-10-24 DIAGNOSIS — F329 Major depressive disorder, single episode, unspecified: Secondary | ICD-10-CM | POA: Diagnosis not present

## 2022-10-24 DIAGNOSIS — F4310 Post-traumatic stress disorder, unspecified: Secondary | ICD-10-CM | POA: Diagnosis not present

## 2022-10-30 ENCOUNTER — Other Ambulatory Visit: Payer: Self-pay | Admitting: Internal Medicine

## 2022-11-02 ENCOUNTER — Encounter: Payer: Self-pay | Admitting: Neurology

## 2022-11-02 ENCOUNTER — Ambulatory Visit (INDEPENDENT_AMBULATORY_CARE_PROVIDER_SITE_OTHER): Payer: BC Managed Care – PPO | Admitting: Neurology

## 2022-11-02 VITALS — BP 111/70 | HR 68 | Ht 73.0 in | Wt 218.0 lb

## 2022-11-02 DIAGNOSIS — I63112 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: Secondary | ICD-10-CM | POA: Diagnosis not present

## 2022-11-02 DIAGNOSIS — M87051 Idiopathic aseptic necrosis of right femur: Secondary | ICD-10-CM

## 2022-11-02 DIAGNOSIS — G40209 Localization-related (focal) (partial) symptomatic epilepsy and epileptic syndromes with complex partial seizures, not intractable, without status epilepticus: Secondary | ICD-10-CM

## 2022-11-02 DIAGNOSIS — R9089 Other abnormal findings on diagnostic imaging of central nervous system: Secondary | ICD-10-CM | POA: Diagnosis not present

## 2022-11-02 DIAGNOSIS — C712 Malignant neoplasm of temporal lobe: Secondary | ICD-10-CM | POA: Diagnosis not present

## 2022-11-02 DIAGNOSIS — G3184 Mild cognitive impairment, so stated: Secondary | ICD-10-CM | POA: Insufficient documentation

## 2022-11-02 DIAGNOSIS — F332 Major depressive disorder, recurrent severe without psychotic features: Secondary | ICD-10-CM | POA: Diagnosis not present

## 2022-11-02 DIAGNOSIS — D689 Coagulation defect, unspecified: Secondary | ICD-10-CM

## 2022-11-02 DIAGNOSIS — I7121 Aneurysm of the ascending aorta, without rupture: Secondary | ICD-10-CM

## 2022-11-02 DIAGNOSIS — I63431 Cerebral infarction due to embolism of right posterior cerebral artery: Secondary | ICD-10-CM

## 2022-11-02 MED ORDER — PREDNISONE 50 MG PO TABS: 50.0000 mg | ORAL_TABLET | Freq: Four times a day (QID) | ORAL | 0 refills | Status: AC

## 2022-11-02 MED ORDER — DIPHENHYDRAMINE HCL 50 MG PO TABS: ORAL_TABLET | ORAL | 0 refills | Status: DC

## 2022-11-02 NOTE — Progress Notes (Signed)
SLEEP MEDICINE CLINIC    Provider:  Larey Seat, MD  Primary Care Physician:  Janith Lima, MD [place] [address]     Referring Provider: This patient is referred via his Neuro oncologist at [facility].        Chief Complaint according to patient   Patient presents with:     New Patient (Initial Visit)           HISTORY OF PRESENT ILLNESS:  Shawn York is a 47 y.o. male patient who is seen upon referral on 11/02/2022 from his PCP to follow up on an abnormal MRI obtained through [facility].  The neuro-oncologist referred for a stroke evaluation.  The patient is not an active patient -  not seen at [facility] in 5 years. I was asked to see him once for sleep walking.  Visit is based on changes in his03/06/24  brain MRIs which are obtained and following up on his brain tumor history.  He underwent a partial resection of his right temporal anaplastic oligodendroglioma at [facility] in 2012 and his sleepwalking actually developed after the surgery.  The patient states that he has not noticed any physical disability nothing that would indicate a stroke or a expanse of the previously diagnosed tumor.  There is no weakness coordination difficulty no sensory abnormality that is new but he does feel cognitively impaired.  In his own words he describes this as feeling foggy and sometimes losing track of time.  A Interval history: he had 3 procedures done in 2022, Hip surgery, ant cervical fusion, 02-2022 [facility], embolization for epistaxis. Here is his angio cather therization: FINDINGS: Right radial artery ultrasound and right radial artery angiogram: The caliber of the distal right radial artery is appropriate for angiogram access. The right radial artery and the right ulnar artery have normal course and caliber. No significant anatomical variants noted.   Left CCA angiograms: Cervical angiograms show normal course and caliber of the visualized left common carotid and  internal carotid arteries. There are no significant stenoses.   Left ICA angiograms: There is brisk vascular contrast filling of the left ACA and MCA vascular trees. Luminal caliber is smooth and tapering. No aneurysms or abnormally high-flow, early draining veins are seen. No regions of abnormal hypervascularity are noted. The visualized dural sinuses are patent. Small contribution of the ethmoidal branches of the ophthalmic artery to the nasal cavity is seen.   Left ECA angiograms: Prominent arterial blush of the nasal septum, inferior and lateral walls of the left nasal cavity are seen from sphenopalatine artery. Minor contribution from the left facial artery.   Right vertebral artery angiograms: The right vertebral artery, basilar artery and visualized portions of the bilateral PCA are normal in course and caliber. No aneurysms or abnormally high-flow, early draining veins are seen. No regions of abnormal hypervascularity are noted. The visualized dural sinuses are patent.   Right CCA angiograms: Cervical angiograms show normal course and caliber of the visualized right common carotid and internal carotid arteries. There are no significant stenoses.   Right CCA angiograms-cranial views: There is brisk vascular contrast filling of the right ACA and MCA vascular trees. Luminal caliber is smooth and tapering. No aneurysms or abnormally high-flow, early draining veins are seen. No regions of abnormal hypervascularity are noted. The visualized dural sinuses are patent. Contribution of the ethmoidal branches of the ophthalmic artery to the nasal cavity is seen.   Right ECA angiograms: Prominent arterial blush  of the nasal septum, inferior and lateral walls of the right nasal cavity and inferior aspect of the left nasal cavity are seen from sphenopalatine artery. Minor contribution from the right facial artery.   PROCEDURE: Left sphenopalatine artery embolization:   Left  external carotid artery angiogram were obtained with magnified frontal and lateral views of the face were obtained. Using biplane roadmap guidance, a Prowler select Plus microcatheter was navigated over a synchro select microguidewire into the left sphenopalatine artery. Frontal and lateral angiograms were obtained with microcatheter contrast injection. Endovascular embolization was then performed with 250-350 polyvinyl alcohol particles (PVA) in an iodinated contrast suspension. No evidence of nontarget embolization. The microcatheter was subsequently withdrawn. Left external carotid artery angiograms showed adequate embolization of the sphenopalatine artery with persistent supply of the nasal cavity by a greater palatine artery.   Using biplane roadmap, attempted navigation of a new prowler select plus microcatheter over a synchro select microguidewire into the right greater palatine artery resulted in herniation of the guide catheter into the aortic arch. The guide catheter was then exchanged over the wire and under fluoroscopy for a 5 French Simmons 2 glide catheter which was navigated over a 0.035" Terumo Glidewire into the right subclavian artery. The catheter tip was reformed in the aortic arch. The catheter was then navigated into the left common carotid artery. The glide catheter was then exchanged over the wire and under fluoroscopy for a benchmark catheter which was placed into the left external carotid artery. Frontal and lateral angiograms of the face were obtained.   Using biplane roadmap guidance, a headway duo microcatheter was navigated over a synchro select microguidewire into the left greater palatine artery. Frontal and lateral angiograms were obtained with microcatheter contrast injection. Endovascular embolization was then performed with 250-350 polyvinyl alcohol particles (PVA) in an iodinated contrast suspension. No evidence of nontarget embolization. The  microcatheter was subsequently withdrawn. Left external carotid artery angiograms showed adequate embolization of the sphenopalatine artery and greater palatine arteries. Left internal carotid artery angiogram showed no evidence of nontarget embolization to the intracranial circulation.   Right sphenopalatine artery embolization:   Right external carotid artery angiogram were obtained with magnified frontal and lateral views of the face were obtained. Using biplane roadmap guidance, a Prowler select Plus microcatheter was navigated over a synchro select microguidewire into the right sphenopalatine artery. Frontal and lateral angiograms were obtained with microcatheter contrast injection. Endovascular embolization was then performed with 250-350 polyvinyl alcohol particles (PVA) in an iodinated contrast suspension. No evidence of nontarget embolization. The microcatheter was subsequently withdrawn. Right external carotid artery angiograms showed adequate embolization of the sphenopalatine artery. Right common carotid artery angiogram showed no evidence of nontarget embolization to the intracranial circulation. The guide catheter was subsequently withdrawn.   The guide catheter was subsequently withdrawn.   An inflatable band was placed and inflated over the right hand access site. The vascular sheath was withdrawn and the band was slowly deflated until brisk flow was noted through the arteriotomy site. At this point, the band was reinflated with additional 2 cc of air to obtain patent hemostasis.   IMPRESSION: Successful and uncomplicated bilateral interbody and artery embolization for treatment of persistent recurrent epistaxis.   PLAN: 1. Post angiogram care with radial band deflation protocol. 2. Further management per ENT.     Electronically Signed   By: Pedro Earls M.D.   On: 11/16/2021 15:58   Chief concern according to patient :  My MRI showed  changes  that may indicate a stroke.  I have the reports here , not the images.  History : Right temporal anaplastic oligodendroglioma,  WHO grade 3 diagnosed 02/25/2011 status post radiation, temozolomide, and  Avastin, last treatment 06/26/2013.  A previous study from April 13, 2022 had shown an enhancement in the right caudate which considerably decreased in the interval.  There was no evidence of an acute infarct or bleed in diffusion-weighted images and there is no hydrocephalus.  What is described is a T2 signal abnormality and loss of normal gray-white matter differentiation in the right superior temporal gyrus slowly progressive when compared to older MRIs going back all the way to 2017.  There is a T2 flair signal within residual portions of the right superior temporal gyrus.  The findings were interpreted as suggestive of a chronic right caudate infarct.  The right temporal changes were interpreted as a possible slow progression of residual tumor.  I do not have a second study here but states that the patient has a new masslike enhancement near the right caudate measuring 1.5 x 0.6 x 1.0 cm no significant mass effect.  No other parenchymal changes.  In the impression it states again that the interval for future MRIs will now be reduced from 12 to acute 36-month  And that the findings were nonspecific and may represent a subacute infarct with tumor considered to be less likely.   I have the pleasure of seeing Shawn Glawsonon 11/02/22 - he has much less frequent sleep walking events and is here to rule out a stroke ( may be following an embolization procedure).  His referral is based on a  goal  to initiate a stroke work up for the patient.  This entails , Carotid Doppler, MRA - he is allergic to a iodine contrasts. [facility].  He stated he had an Echo cardiogram at a young age , following a syncope. His brother had died of an aortic dissection  aneurysm christmas 220 at age 326 .Marland KitchenHe has [facility]     SEldon Latteris a 47yo male with history of HTN, Brain Cancer (2012), anemia related to thiamine deficiency, and Ascending Aortic Dissection measuring 4.0 x 4.1 cm.  The patient states that his brother passed away from an Aortic Dissection this past christmas.  Due to this he requested a CT scan to see if he had an aneurysm as well.  He occasionally has some chest pain and shortness of breath with exertion.  He is a former smoker, having smoked about 10 years 1 ppd.  He drinks alcohol daily up to 6 beers per day.     Review of Systems: Out of a complete 14 system review, the patient complains of only the following symptoms, and all other reviewed systems are negative.:  MCI   Social History   Socioeconomic History   Marital status: Married    Spouse name: Not on file   Number of children: Not on file   Years of education: Not on file   Highest education level: Not on file  Occupational History   Not on file  Tobacco Use   Smoking status: Former    Types: Cigarettes    Quit date: 02/25/2011    Years since quitting: 11.6   Smokeless tobacco: Never  Vaping Use   Vaping Use: Never used  Substance and Sexual Activity  Alcohol use: Not Currently    Alcohol/week: 14.0 standard drinks of alcohol    Types: 14 Cans of beer per week    Comment: quit 11-08-2021   Drug use: No   Sexual activity: Yes    Comment: E-cigarette users  Other Topics Concern   Not on file  Social History Narrative   Not on file   Social Determinants of Health   Financial Resource Strain: Not on file  Food Insecurity: Not on file  Transportation Needs: Not on file  Physical Activity: Not on file  Stress: Not on file  Social Connections: Not on file    Family History  Problem Relation Age of Onset   Cancer Other        brain cancer   Cancer Father        skin cancer   Cancer Maternal Grandmother  46       brain cancer   Alcohol abuse Brother    Alzheimer's disease Paternal Grandmother    Dementia Paternal Grandmother    Heart disease Paternal Grandfather     Past Medical History:  Diagnosis Date   Anemia    Avascular necrosis ([place])    Brain cancer ([place]) 02/25/2011   grade III anaplastic ologodendrglioma   Depression    Epistaxis    Headache    Hypertension    PTSD (post-traumatic stress disorder)    Seizures ([place])     Past Surgical History:  Procedure Laterality Date   BASAL CELL CARCINOMA EXCISION  1995   CRANIOTOMY FOR TUMOR Right 02/25/2011   IR ANGIO EXTERNAL CAROTID SEL EXT CAROTID BILAT MOD SED  11/16/2021   IR ANGIO INTRA EXTRACRAN SEL COM CAROTID INNOMINATE UNI R MOD SED  11/16/2021   IR ANGIO INTRA EXTRACRAN SEL INTERNAL CAROTID UNI L MOD SED  11/16/2021   IR ANGIO VERTEBRAL SEL VERTEBRAL UNI R MOD SED  11/16/2021   IR ANGIOGRAM FOLLOW UP STUDY  11/17/2021   IR NEURO EACH ADD'L AFTER BASIC UNI LEFT (MS)  11/17/2021   IR NEURO EACH ADD'L AFTER BASIC UNI RIGHT (MS)  11/17/2021   IR TRANSCATH/EMBOLIZ  11/16/2021   IR US GUIDE VASC ACCESS RIGHT  11/16/2021   RADIOLOGY WITH ANESTHESIA N/A 11/16/2021   Procedure: IR WITH ANESTHESIA;  Surgeon: Pedro Earls, MD;  Location: [facility];  Service: Radiology;  Laterality: N/A;   TOTAL HIP ARTHROPLASTY Right 12/21/2021   Procedure: RIGHT TOTAL HIP REPLACEMENT;  Surgeon: Leandrew Koyanagi, MD;  Location: [facility];  Service: Orthopedics;  Laterality: Right;  3-C   WISDOM TOOTH EXTRACTION       Current Outpatient Medications on File Prior to Visit  Medication Sig Dispense Refill   b complex vitamins capsule Take 1 capsule by mouth daily.     busPIRone (BUSPAR) 30 MG tablet Take 1 tablet (30 mg total) by mouth 2 (two) times daily. 180 tablet 1   doxazosin (CARDURA) 4 MG tablet Take 1 tablet (4 mg total) by mouth at bedtime. 90 tablet 1   EPINEPHrine 0.3 mg/0.3 mL IJ SOAJ injection Inject 0.3 mg into the muscle once  as needed (for a severe, allergic reaction).      FLUoxetine (PROZAC) 20 MG capsule Take 1 capsule daily for 1 week, then increase to 2 capsules daily 60 capsule 2   levocetirizine (XYZAL) 5 MG tablet TAKE 1 TABLET BY MOUTH EVERY DAY IN THE EVENING 90 tablet 1   nebivolol (BYSTOLIC) 5 MG tablet TAKE 1 TABLET (5  MG TOTAL) BY MOUTH DAILY. 90 tablet 0   Oxcarbazepine (TRILEPTAL) 300 MG tablet Take 1 tablet (300 mg total) by mouth 2 (two) times daily. 60 tablet 0   thiamine 100 MG tablet Take 1 tablet (100 mg total) by mouth daily. 90 tablet 1   No current facility-administered medications on file prior to visit.    Allergies  Allergen Reactions   Iodinated Contrast Media Rash and Anaphylaxis   Penicillins Shortness Of Breath and Rash    Did it involve swelling of the face/tongue/throat, SOB, or low BP? Yes Did it involve sudden or severe rash/hives, skin peeling, or any reaction on the inside of your mouth or nose? No Did you need to seek medical attention at a hospital or doctor's office? No When did it last happen? Unk If all above answers are "NO", may proceed with cephalosporin use.     Shellfish Allergy Anaphylaxis and Rash   Shellfish-Derived Products Anaphylaxis and Rash     DIAGNOSTIC DATA (LABS, IMAGING, TESTING) - I reviewed patient records, labs, notes, testing and imaging myself where available.  Lab Results  Component Value Date   WBC 6.0 02/28/2022   HGB 14.6 02/28/2022   HCT 42.9 02/28/2022   MCV 93.0 02/28/2022   PLT 187.0 02/28/2022      Component Value Date/Time   NA 136 12/15/2021 1522   NA 141 06/21/2013 1505   K 3.9 12/15/2021 1522   K 4.2 06/21/2013 1505   CL 106 12/15/2021 1522   CO2 26 12/15/2021 1522   CO2 25 06/21/2013 1505   GLUCOSE 90 12/15/2021 1522   GLUCOSE 83 06/21/2013 1505   BUN 20 12/15/2021 1522   BUN 18.1 06/21/2013 1505   CREATININE 1.03 12/15/2021 1522   CREATININE 0.9 06/21/2013 1505   CALCIUM 8.9 12/15/2021 1522   CALCIUM 9.1  06/21/2013 1505   PROT 7.1 12/15/2021 1522   PROT 7.0 06/21/2013 1505   ALBUMIN 4.4 12/15/2021 1522   ALBUMIN 4.1 06/21/2013 1505   AST 15 12/15/2021 1522   AST 15 06/21/2013 1505   ALT 19 12/15/2021 1522   ALT 13 06/21/2013 1505   ALKPHOS 42 12/15/2021 1522   ALKPHOS 59 06/21/2013 1505   BILITOT 0.3 12/15/2021 1522   BILITOT 0.33 06/21/2013 1505   GFRNONAA >60 12/15/2021 1522   GFRAA >60 04/30/2020 1533   Lab Results  Component Value Date   CHOL 213 (H) 05/17/2021   HDL 53.60 05/17/2021   LDLDIRECT 127.0 05/17/2021   TRIG 271.0 (H) 05/17/2021   CHOLHDL 4 05/17/2021   No results found for: "HGBA1C" Lab Results  Component Value Date   VITAMINB12 502 11/22/2021   Lab Results  Component Value Date   TSH 1.37 05/17/2021    PHYSICAL EXAM:  Today's Vitals   11/02/22 1340  BP: 111/70  Pulse: 68  Weight: 218 lb (98.9 kg)  Height: '6\' 1"'$  (1.854 m)   Body mass index is 28.76 kg/m.   Wt Readings from Last 3 Encounters:  11/02/22 218 lb (98.9 kg)  03/31/22 198 lb (89.8 kg)  02/28/22 198 lb (89.8 kg)     Ht Readings from Last 3 Encounters:  11/02/22 '6\' 1"'$  (1.854 m)  03/31/22 '6\' 1"'$  (1.854 m)  02/28/22 '6\' 1"'$  (1.854 m)      General: The patient is awake, alert and appears not in acute distress. The patient is well groomed. Head: Normocephalic, atraumatic. Neck is supple.  Cardiovascular:  Regular rate and cardiac rhythm by pulse,  without  distended neck veins. Respiratory: Lungs are clear to auscultation.  Skin:  Without evidence of ankle edema, or rash. Trunk: The patient's posture is erect.   NEUROLOGIC EXAM: The patient is awake and alert, oriented to place and time.   Memory subjective described as impaired. Visio-spatial impairment-  peripheral vision.  Attention span & concentration ability appears normal.  Speech  without  dysarthria, dysphonia , but sometimes not finding works,  Mood and affect are appropriate.   Cranial nerves: no loss of smell or  taste reported  Pupils are equal and briskly reactive to light. Extraocular movements in vertical and horizontal planes were intact and without nystagmus. No Diplopia. Visual fields by finger perimetry are intact. Hearing was impaired on the right- tinnitus.   Facial sensation intact to fine touch.  Facial motor strength is symmetric and tongue and uvula move midline.  Neck ROM : rotation, tilt and flexion extension were normal for age and shoulder shrug was symmetrical.    Motor exam:  Symmetric bulk, tone and ROM.   Normal tone without cog wheeling, symmetric grip strength .   Sensory:    Coordination: Rapid alternating movements in the fingers/hands were of normal speed.  The Finger-to-nose maneuver was intact without evidence of tremor at target on the left    Gait and station: Patient could rise unassisted from a seated position, walked without assistive device.  Stance is of normal width/ base and the patient turned with 4 steps.  Balance problems with left leg-  Toe and heel walk were deferred.  Deep tendon reflexes: in the  upper and lower extremities are symmetric - the patella reflex is very brisk, but no clonus.  Babinski response was deferred .    ASSESSMENT AND PLAN 47 y.o. year old male  here with:   1) abnormal MRI brain obtained during routine follow up with [facility].  There are both possibilities noted, an ischemic insult following embolization by catheter for Epistaxis and a tumor recurrence   2)I will order echo ,carotid doppler and CT angiogram. I will ask Dr Clydene Fake input.  3) cognitive impairment may be related to tumor resection and progression.  4) patient on trip [place] for years,  describing spells of LOC- 'lost track of time "    I plan to follow up either personally or through [facility]  within 3-5 months.   I would like to thank Janith Lima, MD and Janith Lima, [place],  [place] [address] for allowing me to meet with and  to take care of this pleasant patient.   CC: I will share my notes with Dr Leonie Man.  After spending a total time of  65  minutes face to face and additional time for physical and neurologic examination, review of laboratory studies,  personal review of imaging studies, reports and results of other testing and review of referral information / records as far as provided in visit,   Electronically signed by: Larey Seat, MD 11/02/2022 2:02 PM  [facility] Board certified by The [employer] of Sleep Medicine and Diplomate of the [employer] of Sleep Medicine. Board certified In Neurology through the [place], Fellow of the [employer] of Neurology. Medical Director of [facility].

## 2022-11-02 NOTE — Patient Instructions (Signed)
Stroke Prevention Some medical conditions and behaviors can lead to a higher chance of having a stroke. You can help prevent a stroke by eating healthy, exercising, not smoking, and managing any medical conditions you have. Stroke is a leading cause of functional impairment. Primary prevention is particularly important because a majority of strokes are first-time events. Stroke changes the lives of not only those who experience a stroke but also their family and other caregivers. How can this condition affect me? A stroke is a medical emergency and should be treated right away. A stroke can lead to brain damage and can sometimes be life-threatening. If a person gets medical treatment right away, there is a better chance of surviving and recovering from a stroke. What can increase my risk? The following medical conditions may increase your risk of a stroke: Cardiovascular disease. High blood pressure (hypertension). Diabetes. High cholesterol. Sickle cell disease. Blood clotting disorders (hypercoagulable state). Obesity. Sleep disorders (obstructive sleep apnea). Other risk factors include: Being older than age 60. Having a history of blood clots, stroke, or mini-stroke (transient ischemic attack, TIA). Genetic factors, such as race, ethnicity, or a family history of stroke. Smoking cigarettes or using other tobacco products. Taking birth control pills, especially if you also use tobacco. Heavy use of alcohol or drugs, especially cocaine and methamphetamine. Physical inactivity. What actions can I take to prevent this? Manage your health conditions High cholesterol levels. Eating a healthy diet is important for preventing high cholesterol. If cholesterol cannot be managed through diet alone, you may need to take medicines. Take any prescribed medicines to control your cholesterol as told by your health care provider. Hypertension. To reduce your risk of stroke, try to keep your blood  pressure below 130/80. Eating a healthy diet and exercising regularly are important for controlling blood pressure. If these steps are not enough to manage your blood pressure, you may need to take medicines. Take any prescribed medicines to control hypertension as told by your health care provider. Ask your health care provider if you should monitor your blood pressure at home. Have your blood pressure checked every year, even if your blood pressure is normal. Blood pressure increases with age and some medical conditions. Diabetes. Eating a healthy diet and exercising regularly are important parts of managing your blood sugar (glucose). If your blood sugar cannot be managed through diet and exercise, you may need to take medicines. Take any prescribed medicines to control your diabetes as told by your health care provider. Get evaluated for obstructive sleep apnea. Talk to your health care provider about getting a sleep evaluation if you snore a lot or have excessive sleepiness. Make sure that any other medical conditions you have, such as atrial fibrillation or atherosclerosis, are managed. Nutrition Follow instructions from your health care provider about what to eat or drink to help manage your health condition. These instructions may include: Reducing your daily calorie intake. Limiting how much salt (sodium) you use to 1,500 milligrams (mg) each day. Using only healthy fats for cooking, such as olive oil, canola oil, or sunflower oil. Eating healthy foods. You can do this by: Choosing foods that are high in fiber, such as whole grains, and fresh fruits and vegetables. Eating at least 5 servings of fruits and vegetables a day. Try to fill one-half of your plate with fruits and vegetables at each meal. Choosing lean protein foods, such as lean cuts of meat, poultry without skin, fish, tofu, beans, and nuts. Eating low-fat dairy products. Avoiding   foods that are high in sodium. This can help  lower blood pressure. Avoiding foods that have saturated fat, trans fat, and cholesterol. This can help prevent high cholesterol. Avoiding processed and prepared foods. Counting your daily carbohydrate intake.  Lifestyle If you drink alcohol: Limit how much you have to: 0-1 drink a day for women who are not pregnant. 0-2 drinks a day for men. Know how much alcohol is in your drink. In the [place] one drink equals one 12 oz bottle of beer (318m), one 5 oz glass of wine (1456m, or one 1 oz glass of hard liquor (4431m Do not use any products that contain nicotine or tobacco. These products include cigarettes, chewing tobacco, and vaping devices, such as e-cigarettes. If you need help quitting, ask your health care provider. Avoid secondhand smoke. Do not use drugs. Activity  Try to stay at a healthy weight. Get at least 30 minutes of exercise on most days, such as: Fast walking. Biking. Swimming. Medicines Take over-the-counter and prescription medicines only as told by your health care provider. Aspirin or blood thinners (antiplatelets or anticoagulants) may be recommended to reduce your risk of forming blood clots that can lead to stroke. Avoid taking birth control pills. Talk to your health care provider about the risks of taking birth control pills if: You are over 35 104ars old. You smoke. You get very bad headaches. You have had a blood clot. Where to find more information American Stroke Association: www.strokeassociation.org Get help right away if: You or a loved one has any symptoms of a stroke. "BE FAST" is an easy way to remember the main warning signs of a stroke: B - Balance. Signs are dizziness, sudden trouble walking, or loss of balance. E - Eyes. Signs are trouble seeing or a sudden change in vision. F - Face. Signs are sudden weakness or numbness of the face, or the face or eyelid drooping on one side. A - Arms. Signs are weakness or numbness in an arm. This happens  suddenly and usually on one side of the body. S - Speech. Signs are sudden trouble speaking, slurred speech, or trouble understanding what people say. T - Time. Time to call emergency services. Write down what time symptoms started. You or a loved one has other signs of a stroke, such as: A sudden, severe headache with no known cause. Nausea or vomiting. Seizure. These symptoms may represent a serious problem that is an emergency. Do not wait to see if the symptoms will go away. Get medical help right away. Call your local emergency services (911 in the [place] Do not drive yourself to the hospital. Summary You can help to prevent a stroke by eating healthy, exercising, not smoking, limiting alcohol intake, and managing any medical conditions you may have. Do not use any products that contain nicotine or tobacco. These include cigarettes, chewing tobacco, and vaping devices, such as e-cigarettes. If you need help quitting, ask your health care provider. Remember "BE FAST" for warning signs of a stroke. Get help right away if you or a loved one has any of these signs. This information is not intended to replace advice given to you by your health care provider. Make sure you discuss any questions you have with your health care provider. Document Revised: 02/27/2020 Document Reviewed: 03/16/2020 Elsevier Patient Education  [id number]

## 2022-11-07 DIAGNOSIS — F329 Major depressive disorder, single episode, unspecified: Secondary | ICD-10-CM | POA: Diagnosis not present

## 2022-11-07 DIAGNOSIS — F4310 Post-traumatic stress disorder, unspecified: Secondary | ICD-10-CM | POA: Diagnosis not present

## 2022-11-08 ENCOUNTER — Telehealth: Payer: Self-pay | Admitting: Neurology

## 2022-11-08 DIAGNOSIS — F332 Major depressive disorder, recurrent severe without psychotic features: Secondary | ICD-10-CM

## 2022-11-08 DIAGNOSIS — I63112 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: Secondary | ICD-10-CM

## 2022-11-08 DIAGNOSIS — I63431 Cerebral infarction due to embolism of right posterior cerebral artery: Secondary | ICD-10-CM

## 2022-11-08 NOTE — Telephone Encounter (Signed)
Order reentered for the pt

## 2022-11-08 NOTE — Addendum Note (Signed)
Addended by: Darleen Crocker on: 11/08/2022 10:27 AM   Modules accepted: Orders

## 2022-11-08 NOTE — Telephone Encounter (Signed)
The order needs re-entered as VAS US Carotid, it does not show in the queue the way it is now.

## 2022-11-09 DIAGNOSIS — C712 Malignant neoplasm of temporal lobe: Secondary | ICD-10-CM | POA: Diagnosis not present

## 2022-11-09 DIAGNOSIS — G9389 Other specified disorders of brain: Secondary | ICD-10-CM | POA: Diagnosis not present

## 2022-11-09 DIAGNOSIS — G40909 Epilepsy, unspecified, not intractable, without status epilepticus: Secondary | ICD-10-CM | POA: Diagnosis not present

## 2022-11-09 DIAGNOSIS — Z87891 Personal history of nicotine dependence: Secondary | ICD-10-CM | POA: Diagnosis not present

## 2022-11-10 ENCOUNTER — Telehealth: Payer: Self-pay | Admitting: Neurology

## 2022-11-10 ENCOUNTER — Ambulatory Visit (INDEPENDENT_AMBULATORY_CARE_PROVIDER_SITE_OTHER): Payer: BC Managed Care – PPO | Admitting: Neurology

## 2022-11-10 DIAGNOSIS — I63431 Cerebral infarction due to embolism of right posterior cerebral artery: Secondary | ICD-10-CM

## 2022-11-10 DIAGNOSIS — I63112 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: Secondary | ICD-10-CM

## 2022-11-10 DIAGNOSIS — I7121 Aneurysm of the ascending aorta, without rupture: Secondary | ICD-10-CM

## 2022-11-10 DIAGNOSIS — C712 Malignant neoplasm of temporal lobe: Secondary | ICD-10-CM

## 2022-11-10 DIAGNOSIS — C719 Malignant neoplasm of brain, unspecified: Secondary | ICD-10-CM

## 2022-11-10 DIAGNOSIS — D689 Coagulation defect, unspecified: Secondary | ICD-10-CM

## 2022-11-10 DIAGNOSIS — R4182 Altered mental status, unspecified: Secondary | ICD-10-CM | POA: Diagnosis not present

## 2022-11-10 DIAGNOSIS — R9089 Other abnormal findings on diagnostic imaging of central nervous system: Secondary | ICD-10-CM

## 2022-11-10 DIAGNOSIS — G3184 Mild cognitive impairment, so stated: Secondary | ICD-10-CM

## 2022-11-10 NOTE — Telephone Encounter (Signed)
[employer] NPR at free standing facility via [facility] sent to GI [phone]

## 2022-11-14 ENCOUNTER — Telehealth: Payer: Self-pay | Admitting: Neurology

## 2022-11-14 NOTE — Telephone Encounter (Signed)
Dr Dohmeier completed the form form for the patient at the visit he had. I added some other information and will provide to MR. Reached out to the pt to ask how he would like to go about getting the form.

## 2022-11-14 NOTE — Telephone Encounter (Signed)
patient is allergic to contrast, sent to [facility] [phone]

## 2022-11-16 NOTE — Progress Notes (Signed)
[facility]  EEG (ELECTROENCEPHALOGRAM) REPORT     ORDERING CLINICIAN: Larey Seat, M.D.  TECHNOLOGIST: Wyman Songster, REEGT TECHNIQUE:  This Electroencephalogram was recorded utilizing the international standard 10-20 system of lead placement and reformatted into average and bipolar montages.  A single ECG electrode is placed to detect heart rate and rhythm. While a camera is directed at the patient during EEG , and a video screen is parallel to the EEG observed by the attending technologist, the video and audio can not be recorded.   STUDY DATE:  11-08-2022 PATIENT NAME: Shawn York Recoding duration : 26.12  Activation included:  yes-Photic stimulation : yes-Hyperventilation .      Description: The EEG's posterior dominant background rhythm of 10 hertz was symmetrically displayed while the patient's eyes were closed , and promptly attenuated with eye opening. At baseline, the recording showed a moderate  amplitude and focal higher amplitude and slower frequency over T8 . Phase reversed activity was seen in the double banana montage  between T8 to T4 and the T4 to P8 channel.    Photic stimulation was initiated at frequencies from 3- through 21 hertz, resulting in photic entrainment at  13 and 15 hz  , with amplitude increases over T8 and C4 . Now a pattern of beta fast rhythm  riding over the right fronto- parietal channels evolved.    After  the Hyperventilation maneuver was initiated, there was significant  amplitude build-up and  slowing became more noticeable  at the right T4-8 region. .  Following hyperventilation the patient's EEG was reviewed for a period of 1 and 2 minutes post maneuver, and indicated drowsiness and early sleep stages , NREM .  EKG documented NSR with a heart rate of 72 bpm.  The patient was recorded while awake, while drowsy ,and  while asleep.   IMPRESSION:  This EEG is abnormal due to focal findings of right frontoparietal  slowing with higher amplitudes in that focal region. T8 activity  remained slower and with higher amplitude during HV and at rest, and also in sleep.   Abnormal focal slowing and phase reversal at T8   Dr. Larey Seat, M.D. Accredited by the ABPN, [place].

## 2022-11-16 NOTE — Procedures (Signed)
[facility]   EEG (ELECTROENCEPHALOGRAM) REPORT         ORDERING CLINICIAN: Larey Seat, M.D.  TECHNOLOGIST: Wyman Songster, REEGT TECHNIQUE:  This Electroencephalogram was recorded utilizing the international standard 10-20 system of lead placement and reformatted into average and bipolar montages.  A single ECG electrode is placed to detect heart rate and rhythm. While a camera is directed at the patient during EEG , and a video screen is parallel to the EEG observed by the attending technologist, the video and audio can not be recorded.    STUDY DATE:  11-08-2022 PATIENT NAME: Shawn York Recoding duration : 26.12  Activation included:  yes-Photic stimulation : yes-Hyperventilation .           Description: The EEG's posterior dominant background rhythm of 10 hertz was symmetrically displayed while the patient's eyes were closed , and promptly attenuated with eye opening. At baseline, the recording showed a moderate  amplitude and focal higher amplitude and slower frequency over T8 . Phase reversed activity was seen in the double banana montage  between T8 to T4 and the T4 to P8 channel.      Photic stimulation was initiated at frequencies from 3- through 21 hertz, resulting in photic entrainment at  13 and 15 hz  , with amplitude increases over T8 and C4 . Now a pattern of beta fast rhythm  riding over the right fronto- parietal channels evolved.      After  the Hyperventilation maneuver was initiated, there was significant  amplitude build-up and  slowing became more noticeable  at the right T4-8 region. .  Following hyperventilation the patient's EEG was reviewed for a period of 1 and 2 minutes post maneuver, and indicated drowsiness and early sleep stages , NREM .   EKG documented NSR with a heart rate of 72 bpm.   The patient was recorded while awake, while drowsy ,and  while asleep.    IMPRESSION:  This EEG is abnormal due to focal findings of right  frontoparietal slowing with higher amplitudes in that focal region. T8 activity  remained slower and with higher amplitude during HV and at rest, and also in sleep.    Abnormal focal slowing and phase reversal at T8    Dr. Larey Seat, M.D. Accredited by the ABPN, [place].

## 2022-11-17 ENCOUNTER — Encounter: Payer: Self-pay | Admitting: Neurology

## 2022-11-21 ENCOUNTER — Encounter: Payer: Self-pay | Admitting: Psychiatry

## 2022-11-21 ENCOUNTER — Ambulatory Visit (INDEPENDENT_AMBULATORY_CARE_PROVIDER_SITE_OTHER): Payer: BC Managed Care – PPO | Admitting: Psychiatry

## 2022-11-21 DIAGNOSIS — F330 Major depressive disorder, recurrent, mild: Secondary | ICD-10-CM | POA: Diagnosis not present

## 2022-11-21 DIAGNOSIS — F515 Nightmare disorder: Secondary | ICD-10-CM

## 2022-11-21 DIAGNOSIS — F4310 Post-traumatic stress disorder, unspecified: Secondary | ICD-10-CM | POA: Diagnosis not present

## 2022-11-21 MED ORDER — FLUOXETINE HCL 20 MG PO CAPS: ORAL_CAPSULE | ORAL | 2 refills | Status: DC

## 2022-11-21 MED ORDER — BREXPIPRAZOLE 1 MG PO TABS: ORAL_TABLET | ORAL | 0 refills | Status: DC

## 2022-11-21 NOTE — Progress Notes (Unsigned)
[name] IS:[id number] 1975/09/04 47 y.o.  Subjective:   Patient ID:  Shawn York is a 47 y.o. (DOB 1975/09/12) male.  Chief Complaint: No chief complaint on file.   HPI Shawn York presents to the office today for follow-up of anxiety, depression, and sleep disturbance.   He is experiencing side effects with increase in Prozac with minimal improvement- "maybe it took the edge off a little." He reports that anxiety remains high. Denies any improvement in mood. Energy and motivation have been ok and is working on remodeling his kitchen and wife helps motivate him.   He reports dissociation is "still bad." Denies panic attacks. Chronic worry. He describes PTSD s/s as "ever-present." Sleep has been "alright." Nightmares have been fair. He reports poor concentration and feels like he is "always distracted... my ST memory is so bad." Difficulty with time management. He reports occasional hyper-focus. Appetite has been ok. Denies SI.   Continues to see therapist.   Past Psychiatric Medication Trials: Trintellix Rexulti Trileptal Buspar Remeron Zoloft- Sexual side effects. "Took the edge off a little bit."  Prozac- Sexual side effects Viibryd- Severe GI side effects Hydroxyzine- Drowsiness Dayvigo-excessive somnolence Doxazosin-helpful for nightmares Hydroxyzine- had withdrawal    PHQ2-9    [place] Office Visit from 02/28/2022 in [place] at [place] Visit from 10/12/2021 in [place] at [place] Visit from 03/18/2021 in [facility] at [place] Visit from 02/18/2021 in [facility] Primary Care at [place] Visit from 06/02/2020 in [place] at [facility]  PHQ-2 Total Score 2 2 2 3  0  PHQ-9 Total Score 11 4 5 12  0      Flowsheet Row Pre-Admission Testing 60 from 12/14/2021 in [facility] PREADMISSION TESTING ED from  11/21/2021 in [facility] Emergency Department at [facility] ED to Hosp-Admission (Discharged) from 11/15/2021 in [place] No Risk No Risk No Risk        Review of Systems:  Review of Systems  Musculoskeletal:  Negative for gait problem.  Neurological:        He reports some aura. Denies seizure-like activity.   Psychiatric/Behavioral:         Please refer to HPI    Medications: I have reviewed the patient's current medications.  Current Outpatient Medications  Medication Sig Dispense Refill   b complex vitamins capsule Take 1 capsule by mouth daily.     busPIRone (BUSPAR) 30 MG tablet Take 1 tablet (30 mg total) by mouth 2 (two) times daily. 180 tablet 1   diphenhydrAMINE (BENADRYL) 50 MG tablet Take 2 tab of 50 mg prior to CT angiogram. 2 tablet 0   doxazosin (CARDURA) 4 MG tablet Take 1 tablet (4 mg total) by mouth at bedtime. 90 tablet 1   EPINEPHrine 0.3 mg/0.3 mL IJ SOAJ injection Inject 0.3 mg into the muscle once as needed (for a severe, allergic reaction).      FLUoxetine (PROZAC) 20 MG capsule Take 1 capsule daily for 1 week, then increase to 2 capsules daily 60 capsule 2   levocetirizine (XYZAL) 5 MG tablet TAKE 1 TABLET BY MOUTH EVERY DAY IN THE EVENING 90 tablet 1   nebivolol (BYSTOLIC) 5 MG tablet TAKE 1 TABLET (5 MG TOTAL) BY MOUTH DAILY. 90 tablet 0   Oxcarbazepine (TRILEPTAL) 300 MG tablet Take 1 tablet (300 mg total)  by mouth 2 (two) times daily. 60 tablet 0   thiamine 100 MG tablet Take 1 tablet (100 mg total) by mouth daily. 90 tablet 1   No current facility-administered medications for this visit.    Medication Side Effects: Other: Sexual side effects  Allergies:  Allergies  Allergen Reactions   Iodinated Contrast Media Rash and Anaphylaxis   Penicillins Shortness Of Breath and Rash    Did it involve swelling of the face/tongue/throat, SOB, or low BP? Yes Did it involve sudden or severe rash/hives,  skin peeling, or any reaction on the inside of your mouth or nose? No Did you need to seek medical attention at a hospital or doctor's office? No When did it last happen? [place] If all above answers are "NO", may proceed with cephalosporin use.     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   Please see review of systems for further details on the patient's review from today.   Objective:   Physical Exam:  There were no vitals taken for this visit.  Physical Exam  Lab Review:     Component Value Date/Time   NA 136 12/15/2021 1522   NA 141 06/21/2013 1505   K 3.9 12/15/2021 1522   K 4.2 06/21/2013 1505   CL 106 12/15/2021 1522   CO2 26 12/15/2021 1522   CO2 25 06/21/2013 1505   GLUCOSE 90 12/15/2021 1522   GLUCOSE 83 06/21/2013 1505   BUN 20 12/15/2021 1522   BUN 18.1 06/21/2013 1505   CREATININE 1.03 12/15/2021 1522   CREATININE 0.9 06/21/2013 1505   CALCIUM 8.9 12/15/2021 1522   CALCIUM 9.1 06/21/2013 1505   PROT 7.1 12/15/2021 1522   PROT 7.0 06/21/2013 1505   ALBUMIN 4.4 12/15/2021 1522   ALBUMIN 4.1 06/21/2013 1505   AST 15 12/15/2021 1522   AST 15 06/21/2013 1505   ALT 19 12/15/2021 1522   ALT 13 06/21/2013 1505   ALKPHOS 42 12/15/2021 1522   ALKPHOS 59 06/21/2013 1505   BILITOT 0.3 12/15/2021 1522   BILITOT 0.33 06/21/2013 1505   GFRNONAA >60 12/15/2021 1522   GFRAA >60 04/30/2020 1533       Component Value Date/Time   WBC 6.0 02/28/2022 1501   RBC 4.61 02/28/2022 1501   HGB 14.6 02/28/2022 1501   HGB 11.6 (L)  12/15/2021 1522   HGB 13.3 06/21/2013 1505   HCT 42.9 02/28/2022 1501   HCT 38.7 06/21/2013 1505   PLT 187.0 02/28/2022 1501   PLT 145 (L) 12/15/2021 1522   PLT 154 06/21/2013 1505   MCV 93.0 02/28/2022 1501   MCV 99.0 (H) 06/21/2013 1505   MCH 32.7 12/22/2021 0635   MCHC 34.1 02/28/2022 1501   RDW 14.4 02/28/2022 1501   RDW 13.3 06/21/2013 1505   LYMPHSABS 2.2 02/28/2022 1501   LYMPHSABS 1.1 06/21/2013 1505   MONOABS 0.5 02/28/2022 1501   MONOABS 0.5 06/21/2013 1505   EOSABS 0.1 02/28/2022 1501   EOSABS 0.1 06/21/2013 1505   BASOSABS 0.0 02/28/2022  1501   BASOSABS 0.0 06/21/2013 1505    No results found for: "POCLITH", "LITHIUM"   No results found for: "PHENYTOIN", "PHENOBARB", "VALPROATE", "CBMZ"   .res Assessment: Plan:    There are no diagnoses linked to this encounter.   Please see After Visit Summary for patient specific instructions.  Future Appointments  Date Time Provider [place]  11/22/2022  1:40 PM Janina Mayo, MD [facility] None  11/28/2022  2:00 PM [facility] 2 [facility]  11/30/2022  3:00 PM [facility] VASC US 1 - [place] [facility]  02/08/2023  3:30 PM Dohmeier, Asencion Partridge, MD [facility] None    No orders of the defined types were placed in this encounter.   -------------------------------

## 2022-11-22 ENCOUNTER — Ambulatory Visit: Payer: BC Managed Care – PPO | Attending: Internal Medicine | Admitting: Internal Medicine

## 2022-11-22 NOTE — Progress Notes (Deleted)
Cardiology Office Note:    Date:  11/22/2022   ID:  Shawn York, DOB Jan 05, 1976, MRN [id number]  PCP:  Janith Lima, MD   [place] Providers Cardiologist:  None { Click to update primary MD,subspecialty MD or APP then REFRESH:1}    Referring MD: Janith Lima, MD   No chief complaint on file. Thoracic Aneursyms  History of Present Illness:    Shawn York is a 47 y.o. 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 Ascending thoracic aortic aneurysm measuring 4.02 x 4.1 cm referral for management.    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   Depression    Epistaxis    Headache    Hypertension    PTSD (post-traumatic stress disorder)    Seizures ([place])     Past Surgical History:  Procedure Laterality Date   BASAL CELL CARCINOMA EXCISION  1995   CRANIOTOMY FOR TUMOR Right 02/25/2011   IR ANGIO EXTERNAL CAROTID SEL EXT CAROTID BILAT MOD SED  11/16/2021   IR ANGIO INTRA EXTRACRAN SEL COM CAROTID INNOMINATE UNI R MOD SED  11/16/2021   IR ANGIO INTRA EXTRACRAN SEL INTERNAL CAROTID UNI L MOD SED  11/16/2021   IR ANGIO VERTEBRAL SEL VERTEBRAL UNI R MOD SED  11/16/2021   IR ANGIOGRAM FOLLOW UP STUDY  11/17/2021   IR NEURO EACH ADD'L AFTER BASIC UNI LEFT (MS)  11/17/2021   IR NEURO EACH ADD'L AFTER BASIC UNI RIGHT (MS)  11/17/2021   IR TRANSCATH/EMBOLIZ  11/16/2021   IR US GUIDE VASC ACCESS RIGHT  11/16/2021   RADIOLOGY WITH ANESTHESIA N/A 11/16/2021   Procedure: IR WITH ANESTHESIA;  Surgeon: Pedro Earls, MD;  Location: [place];  Service: Radiology;  Laterality: N/A;   TOTAL HIP ARTHROPLASTY Right 12/21/2021   Procedure: RIGHT TOTAL HIP REPLACEMENT;  Surgeon: Leandrew Koyanagi, MD;  Location: [place];  Service: Orthopedics;  Laterality: Right;  3-C    WISDOM TOOTH EXTRACTION      Current Medications: No outpatient medications have been marked as taking for the 11/22/22 encounter (Appointment) with Janina Mayo, MD.     Allergies:   Iodinated contrast media, Penicillins, Shellfish allergy, and Shellfish-derived products   Social History   Socioeconomic History   Marital status: Married    Spouse name: Not on file   Number of children: Not on file   Years of education: Not on file   Highest education level: Not on file  Occupational History   Not on file  Tobacco Use   Smoking status: Former    Types: Cigarettes    Quit date: 02/25/2011    Years since quitting: 11.7   Smokeless tobacco: Never  Vaping Use   Vaping Use: Never used  Substance and Sexual Activity   Alcohol use: Not Currently    Alcohol/week: 14.0 standard drinks of alcohol    Types: 14 Cans of beer per week    Comment: quit 11-08-2021   Drug use: No   Sexual activity: Yes    Comment: E-cigarette users  Other Topics Concern   Not on file  Social History Narrative   Not on file   Social Determinants of Health   Financial Resource Strain: Not on file  Food Insecurity: Not on file  Transportation Needs: Not on file  Physical Activity: Not on file  Stress: Not on file  Social Connections: Not on file     Family History: The patient's ***family history includes Alcohol abuse in his brother; Alzheimer's disease in his paternal grandmother; Cancer in his father and another family member; Cancer (age of onset: 48) in his maternal grandmother; Dementia in his paternal grandmother; Heart disease in his paternal grandfather.  ROS:   Please see the history of present illness.    *** All other systems reviewed and are negative.  EKGs/Labs/Other Studies Reviewed:    The following studies were reviewed today: ***  EKG:  EKG is *** ordered today.  The ekg ordered today demonstrates ***  Recent Labs: 12/15/2021: ALT 19; BUN 20; Creatinine 1.03; Potassium  3.9; Sodium 136 02/28/2022: Hemoglobin 14.6; Platelets 187.0  Recent Lipid Panel    Component Value Date/Time   CHOL 213 (H) 05/17/2021 1434   TRIG 271.0 (H) 05/17/2021 1434   HDL 53.60 05/17/2021 1434   CHOLHDL 4 05/17/2021 1434   VLDL 54.2 (H) 05/17/2021 1434   LDLDIRECT 127.0 05/17/2021 1434     Risk Assessment/Calculations:   {Does this patient have ATRIAL FIBRILLATION?:[phone]}  No BP recorded.  {Refresh Note OR Click here to enter BP  :1}***         Physical Exam:    VS:  There were no vitals taken for this visit.    Wt Readings from Last 3 Encounters:  11/02/22 218 lb (98.9 kg)  03/31/22 198 lb (89.8 kg)  02/28/22 198 lb (89.8 kg)     GEN: *** Well nourished, well developed in no acute distress HEENT: Normal NECK: No JVD; No carotid bruits LYMPHATICS: No lymphadenopathy CARDIAC: ***RRR, no murmurs, rubs, gallops RESPIRATORY:  Clear to auscultation without rales, wheezing or rhonchi  ABDOMEN: Soft, non-tender, non-distended MUSCULOSKELETAL:  No edema; No deformity  SKIN: Warm and dry NEUROLOGIC:  Alert and oriented x 3 PSYCHIATRIC:  Normal affect   ASSESSMENT:    Ascending Aneurysm: low risk. Recommended CT annually. Can repeat in a year. Blood pressure ***. PLAN:    In order of problems listed above:  ***      {Are you ordering a CV Procedure (e.g. stress test, cath, DCCV, TEE, etc)?   Press F2        :[id number]    Medication Adjustments/Labs and Tests Ordered: Current medicines are reviewed at length with the patient today.  Concerns regarding medicines are outlined above.  No orders of the defined types were placed in this encounter.  No orders of the defined types were placed in this encounter.   There are no Patient Instructions on file for this visit.   Signed, Janina Mayo, MD  11/22/2022 1:35 PM    [place]

## 2022-11-24 DIAGNOSIS — J020 Streptococcal pharyngitis: Secondary | ICD-10-CM | POA: Diagnosis not present

## 2022-11-28 ENCOUNTER — Ambulatory Visit (HOSPITAL_COMMUNITY): Payer: BC Managed Care – PPO

## 2022-11-30 ENCOUNTER — Ambulatory Visit (HOSPITAL_COMMUNITY)
Admission: RE | Admit: 2022-11-30 | Discharge: 2022-11-30 | Disposition: A | Discharge status: Home / Self Care | Payer: BC Managed Care – PPO | Source: Ambulatory Visit | Attending: Neurology | Admitting: Neurology

## 2022-11-30 DIAGNOSIS — I63112 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: Secondary | ICD-10-CM | POA: Insufficient documentation

## 2022-11-30 DIAGNOSIS — F332 Major depressive disorder, recurrent severe without psychotic features: Secondary | ICD-10-CM | POA: Insufficient documentation

## 2022-11-30 DIAGNOSIS — I63431 Cerebral infarction due to embolism of right posterior cerebral artery: Secondary | ICD-10-CM | POA: Insufficient documentation

## 2022-12-01 ENCOUNTER — Encounter: Payer: Self-pay | Admitting: Neurology

## 2022-12-01 NOTE — Progress Notes (Signed)
Normal doppler study for carotid artery flow.

## 2022-12-02 ENCOUNTER — Other Ambulatory Visit (HOSPITAL_COMMUNITY): Payer: Self-pay | Admitting: Nurse Practitioner

## 2022-12-02 DIAGNOSIS — C712 Malignant neoplasm of temporal lobe: Secondary | ICD-10-CM

## 2022-12-05 DIAGNOSIS — F329 Major depressive disorder, single episode, unspecified: Secondary | ICD-10-CM | POA: Diagnosis not present

## 2022-12-05 DIAGNOSIS — F4310 Post-traumatic stress disorder, unspecified: Secondary | ICD-10-CM | POA: Diagnosis not present

## 2022-12-06 NOTE — Progress Notes (Signed)
Normal carotid duplex study

## 2022-12-12 ENCOUNTER — Encounter: Payer: Self-pay | Admitting: Psychiatry

## 2022-12-12 ENCOUNTER — Ambulatory Visit (INDEPENDENT_AMBULATORY_CARE_PROVIDER_SITE_OTHER): Payer: BC Managed Care – PPO | Admitting: Psychiatry

## 2022-12-12 ENCOUNTER — Ambulatory Visit (HOSPITAL_COMMUNITY)
Admission: RE | Admit: 2022-12-12 | Discharge: 2022-12-12 | Disposition: A | Discharge status: Home / Self Care | Payer: BC Managed Care – PPO | Source: Ambulatory Visit | Attending: Nurse Practitioner | Admitting: Nurse Practitioner

## 2022-12-12 ENCOUNTER — Encounter: Payer: Self-pay | Admitting: Internal Medicine

## 2022-12-12 ENCOUNTER — Ambulatory Visit (HOSPITAL_COMMUNITY): Payer: BC Managed Care – PPO

## 2022-12-12 ENCOUNTER — Encounter (HOSPITAL_COMMUNITY): Payer: Self-pay

## 2022-12-12 DIAGNOSIS — F4310 Post-traumatic stress disorder, unspecified: Secondary | ICD-10-CM

## 2022-12-12 DIAGNOSIS — C712 Malignant neoplasm of temporal lobe: Secondary | ICD-10-CM | POA: Insufficient documentation

## 2022-12-12 DIAGNOSIS — G9389 Other specified disorders of brain: Secondary | ICD-10-CM | POA: Diagnosis not present

## 2022-12-12 DIAGNOSIS — F515 Nightmare disorder: Secondary | ICD-10-CM | POA: Diagnosis not present

## 2022-12-12 DIAGNOSIS — F330 Major depressive disorder, recurrent, mild: Secondary | ICD-10-CM

## 2022-12-12 MED ORDER — BREXPIPRAZOLE 2 MG PO TABS: 2.0000 mg | ORAL_TABLET | Freq: Every day | ORAL | 1 refills | Status: DC

## 2022-12-12 MED ORDER — GADOBUTROL 1 MMOL/ML IV SOLN
10.0000 mL | Freq: Once | INTRAVENOUS | Status: AC | PRN
  Administered 2022-12-12: 10 mL via INTRAVENOUS

## 2022-12-12 NOTE — Progress Notes (Signed)
Shawn York [id number] [birth year] 47 y.o.  Subjective:   Patient ID:  Shawn York is a 47 y.o. (DOB 16-May-1976) male.  Chief Complaint: No chief complaint on file.   HPI Shawn York presents to the office today for follow-up of depression, anxiety, and insomnia. He reports that he tapered off of Prozac and "I feel better." He reports that he feels "different" with SSRI's. He is unsure about Rexulti benefits- "I don't feel like I am in a bad place with it." He reports that he continues to have some depression and it is "probably better." He reports that he continues to notice frequent triggers with anxiety. He reports some possible improvement in anxiety. He reports that he has had some marital stressors and attributes this to wife saying things that are triggering. He notices some "go-to responses" that may be somatic re-experiencing. He reports nightmares are minimal compared to the past. He reports that his sleep is "ok." He has been drinking tea at night and working on night-time routine and sleep hygiene. Appetite has been ok. Energy and motivation have been low. Concentration is fair. Denies SI.   Had MRI today prior to apt. He reports that neurosurgeon is concerned about possible disease progression.  Past Psychiatric Medication Trials: Trintellix Rexulti Trileptal Buspar Remeron Zoloft- Sexual side effects. "Took the edge off a little bit."  Prozac- Sexual side effects Viibryd- Severe GI side effects Hydroxyzine- Drowsiness Dayvigo-excessive somnolence Doxazosin-helpful for nightmares Hydroxyzine- had withdrawal   AIMS    Flowsheet Row Office Visit from 12/12/2022 in [facility]  AIMS Total Score 0      PHQ2-9    Flowsheet Row Office Visit from 02/28/2022 in [facility] [place] Office Visit from 10/12/2021 in [facility] Office Visit from 03/18/2021 in [facility]  at [facility] Office Visit from 02/18/2021 in [facility] at [facility] Office Visit from 06/02/2020 in [facility]  PHQ-2 Total Score 2 2 2 3  0  PHQ-9 Total Score 11 4 5 12  0      Flowsheet Row Pre-Admission Testing 60 from 12/14/2021 in [facility] PREADMISSION TESTING ED from 11/21/2021 in [facility] Department at [facility] ED to Hosp-Admission (Discharged) from 11/15/2021 in [place] [facility]  C-SSRS RISK CATEGORY No Risk No Risk No Risk        Review of Systems:  Review of Systems  Musculoskeletal:  Negative for gait problem.  Neurological:  Negative for tremors.       Reports a couple of "almost seizures."  Psychiatric/Behavioral:         Please refer to HPI    Medications: I have reviewed the patient's current medications.  Current Outpatient Medications  Medication Sig Dispense Refill   b complex vitamins capsule Take 1 capsule by mouth daily.     brexpiprazole (REXULTI) 1 MG TABS tablet Take 0.5 mg daily for one week, then increase to 1 mg daily 30 tablet 0   busPIRone (BUSPAR) 30 MG tablet Take 1 tablet (30 mg total) by mouth 2 (two) times daily. 180 tablet 1   diphenhydrAMINE (BENADRYL) 50 MG tablet Take 2 tab of 50 mg prior to CT angiogram. 2 tablet 0   doxazosin (CARDURA) 4 MG tablet Take 1 tablet (4 mg total) by mouth at bedtime. 90 tablet 1   EPINEPHrine 0.3 mg/0.3  mL IJ SOAJ injection Inject 0.3 mg into the muscle once as needed (for a severe, allergic reaction).      FLUoxetine (PROZAC) 20 MG capsule Decrease to 1 capsule daily for 1 week, then stop 60 capsule 2   levocetirizine (XYZAL) 5 MG tablet TAKE 1 TABLET BY MOUTH EVERY DAY IN THE EVENING 90 tablet 1   nebivolol (BYSTOLIC) 5 MG tablet TAKE 1 TABLET (5 MG TOTAL) BY MOUTH DAILY. 90 tablet 0   Oxcarbazepine (TRILEPTAL) 300 MG tablet Take 1 tablet (300 mg total) by mouth 2 (two) times  daily. 60 tablet 0   thiamine 100 MG tablet Take 1 tablet (100 mg total) by mouth daily. 90 tablet 1   No current facility-administered medications for this visit.    Medication Side Effects: None  Allergies:  Allergies  Allergen Reactions   Iodinated Contrast Media Rash and Anaphylaxis   Penicillins Shortness Of Breath and Rash    Did it involve swelling of the face/tongue/throat, SOB, or low BP? Yes Did it involve sudden or severe rash/hives, skin peeling, or any reaction on the inside of your mouth or nose? No Did you need to seek medical attention at a hospital or doctor's office? No When did it last happen? Unk If all above answers are "NO", may proceed with cephalosporin use.     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   Please see review of systems for further details on the patient's review from today.   Objective:   Physical Exam:  There were no vitals taken for this visit.  Physical Exam  Lab Review:     Component Value Date/Time   NA 136 12/15/2021 1522   NA 141 06/21/2013 1505   K 3.9 12/15/2021 1522   K 4.2 06/21/2013 1505   CL 106 12/15/2021 1522   CO2 26 12/15/2021 1522   CO2 25 06/21/2013 1505   GLUCOSE 90 12/15/2021 1522   GLUCOSE 83 06/21/2013 1505   BUN 20 12/15/2021 1522   BUN 18.1 06/21/2013 1505   CREATININE 1.03 12/15/2021 1522   CREATININE 0.9 06/21/2013 1505   CALCIUM 8.9 12/15/2021 1522   CALCIUM 9.1 06/21/2013 1505   PROT 7.1 12/15/2021 1522   PROT 7.0 06/21/2013 1505   ALBUMIN 4.4 12/15/2021  1522   ALBUMIN 4.1 06/21/2013 1505   AST 15 12/15/2021 1522   AST 15 06/21/2013 1505   ALT 19 12/15/2021 1522   ALT 13 06/21/2013 1505   ALKPHOS 42 12/15/2021 1522   ALKPHOS 59 06/21/2013 1505   BILITOT 0.3 12/15/2021 1522   BILITOT 0.33 06/21/2013 1505   GFRNONAA >60 12/15/2021 1522   GFRAA >60 04/30/2020 1533       Component Value Date/Time   WBC 6.0 02/28/2022 1501   RBC 4.61 02/28/2022 1501   HGB 14.6 02/28/2022 1501   HGB 11.6 (L) 12/15/2021 1522   HGB 13.3 06/21/2013 1505   HCT 42.9 02/28/2022 1501   HCT 38.7 06/21/2013 1505   PLT 187.0 02/28/2022 1501  PLT 145 (L) 12/15/2021 1522   PLT 154 06/21/2013 1505   MCV 93.0 02/28/2022 1501   MCV 99.0 (H) 06/21/2013 1505   MCH 32.7 12/22/2021 0635   MCHC 34.1 02/28/2022 1501   RDW 14.4 02/28/2022 1501   RDW 13.3 06/21/2013 1505   LYMPHSABS 2.2 02/28/2022 1501   LYMPHSABS 1.1 06/21/2013 1505   MONOABS 0.5 02/28/2022 1501   MONOABS 0.5 06/21/2013 1505   EOSABS 0.1 02/28/2022 1501   EOSABS 0.1 06/21/2013 1505   BASOSABS 0.0 02/28/2022 1501   BASOSABS 0.0 06/21/2013 1505    No results found for: "POCLITH", "LITHIUM"   No results found for: "PHENYTOIN", "PHENOBARB", "VALPROATE", "CBMZ"   .res Assessment: Plan:    There are no diagnoses linked to this encounter.   Please see After Visit Summary for patient specific instructions.  Future Appointments  Date Time Provider Department Center  12/19/2022  3:30 PM [facility]-CT 2 [facility]-CT [facility]  12/26/2022  8:40 AM Maisie Fus, MD [facility] None  02/08/2023  3:30 PM Dohmeier, Porfirio Mylar, MD [facility] None    No orders of the defined types were placed in this encounter.   -------------------------------

## 2022-12-14 DIAGNOSIS — C712 Malignant neoplasm of temporal lobe: Secondary | ICD-10-CM | POA: Diagnosis not present

## 2022-12-15 NOTE — Telephone Encounter (Signed)
He is scheduled at [facility] on 4/22. The [facility] Berkley Harvey is pending faxed notes to NIA tracking [phone] fax number [phone]. I had to call and do it over the phone because I could not add [facility] as the facility online.  His secondary [employer] does not require authorization ref: Jeree L. 12/15/22 12:13pm

## 2022-12-16 ENCOUNTER — Encounter: Payer: Self-pay | Admitting: Neurology

## 2022-12-19 ENCOUNTER — Ambulatory Visit (HOSPITAL_COMMUNITY): Payer: BC Managed Care – PPO

## 2022-12-19 ENCOUNTER — Telehealth: Payer: Self-pay | Admitting: Neurology

## 2022-12-19 DIAGNOSIS — F4310 Post-traumatic stress disorder, unspecified: Secondary | ICD-10-CM | POA: Diagnosis not present

## 2022-12-19 DIAGNOSIS — F329 Major depressive disorder, single episode, unspecified: Secondary | ICD-10-CM | POA: Diagnosis not present

## 2022-12-19 NOTE — Telephone Encounter (Signed)
[employer] [place] Berkley Harvey: [id number] exp. 12/15/22-06/13/23 for [employer] [phone]

## 2022-12-19 NOTE — Telephone Encounter (Signed)
The [employer]A authorization came back today and it is approved.

## 2022-12-20 DIAGNOSIS — C712 Malignant neoplasm of temporal lobe: Secondary | ICD-10-CM | POA: Diagnosis not present

## 2022-12-23 ENCOUNTER — Other Ambulatory Visit: Payer: Self-pay | Admitting: Internal Medicine

## 2022-12-23 DIAGNOSIS — I10 Essential (primary) hypertension: Secondary | ICD-10-CM

## 2022-12-26 ENCOUNTER — Ambulatory Visit: Payer: BC Managed Care – PPO | Attending: Internal Medicine | Admitting: Internal Medicine

## 2022-12-26 ENCOUNTER — Encounter: Payer: Self-pay | Admitting: Internal Medicine

## 2022-12-26 VITALS — BP 122/62 | HR 64 | Ht 73.0 in | Wt 222.4 lb

## 2022-12-26 DIAGNOSIS — I7121 Aneurysm of the ascending aorta, without rupture: Secondary | ICD-10-CM | POA: Diagnosis not present

## 2022-12-26 DIAGNOSIS — Z01812 Encounter for preprocedural laboratory examination: Secondary | ICD-10-CM

## 2022-12-26 DIAGNOSIS — I63112 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: Secondary | ICD-10-CM | POA: Diagnosis not present

## 2022-12-26 DIAGNOSIS — F329 Major depressive disorder, single episode, unspecified: Secondary | ICD-10-CM | POA: Diagnosis not present

## 2022-12-26 DIAGNOSIS — F4310 Post-traumatic stress disorder, unspecified: Secondary | ICD-10-CM | POA: Diagnosis not present

## 2022-12-26 MED ORDER — PREDNISONE 50 MG PO TABS: ORAL_TABLET | ORAL | 0 refills | Status: DC

## 2022-12-26 MED ORDER — METOPROLOL TARTRATE 50 MG PO TABS: ORAL_TABLET | ORAL | 0 refills | Status: DC

## 2022-12-26 NOTE — Patient Instructions (Signed)
Medication Instructions:  Your physician recommends that you continue on your current medications as directed. Please refer to the Current Medication list given to you today.  *If you need a refill on your cardiac medications before your next appointment, please call your pharmacy*   Lab Work: Your physician recommends that you return to have labs drawn at the end of May: BMET If you have labs (blood work) drawn today and your tests are completely normal, you will receive your results only by: MyChart Message (if you have MyChart) OR A paper copy in the mail If you have any lab test that is abnormal or we need to change your treatment, we will call you to review the results.   Testing/Procedures: Your physician has requested that you have an echocardiogram. Echocardiography is a painless test that uses sound waves to create images of your heart. It provides your doctor with information about the size and shape of your heart and how well your heart's chambers and valves are working. This procedure takes approximately one hour. There are no restrictions for this procedure. Please do NOT wear cologne, perfume, aftershave, or lotions (deodorant is allowed). Please arrive 15 minutes prior to your appointment time.    Your cardiac CT will be scheduled at one of the below locations:   [facility] [address] [place], [place] [address] [phone]   If scheduled at [facility], please arrive at the [facility] En[facility] (Entrance C2) of [facility] 30 minutes prior to test start time. You can use the FREE valet parking offered at entrance C (encouraged to control the heart rate for the test)  Proceed to the [facility] Radiology Department (first floor) to check-in and test prep.  All radiology patients and guests should use entrance C2 at [facility], accessed from [facility], even though the hospital's physical address listed is  [address].      Please follow these instructions carefully (unless otherwise directed):  Hold all erectile dysfunction medications at least 3 days (72 hrs) prior to test. (Ie viagra, cialis, sildenafil, tadalafil, etc) We will administer nitroglycerin during this exam.   On the Night Before the Test: Be sure to Drink plenty of water. Do not consume any caffeinated/decaffeinated beverages or chocolate 12 hours prior to your test. Do not take any antihistamines 12 hours prior to your test. If the patient has contrast allergy: Patient will need a prescription for Prednisone and very clear instructions (as follows): Prednisone 50 mg - take 13 hours prior to test Take another Prednisone 50 mg 7 hours prior to test Take another Prednisone 50 mg 1 hour prior to test Take Benadryl 50 mg 1 hour prior to test Patient must complete all four doses of above prophylactic medications. Patient will need a ride after test due to Benadryl.  On the Day of the Test: Drink plenty of water until 1 hour prior to the test. Do not eat any food 1 hour prior to test. You may take your regular medications prior to the test.  Take metoprolol (Lopressor) two hours prior to test. HOLD Bystolic morning of the test.  If you take Furosemide/Hydrochlorothiazide/Spironolactone, please HOLD on the morning of the test.        After the Test: Drink plenty of water. After receiving IV contrast, you may experience a mild flushed feeling. This is normal. On occasion, you may experience a mild rash up to 24 hours after the test.  This is not dangerous. If this occurs, you can take Benadryl 25 mg and increase your fluid intake. If you experience trouble breathing, this can be serious. If it is severe call 911 IMMEDIATELY. If it is mild, please call our office. If you take any of these medications: Glipizide/Metformin, Avandament, Glucavance, please do not take 48 hours after completing test unless otherwise  instructed.  We will call to schedule your test 2-4 weeks out understanding that some insurance companies will need an authorization prior to the service being performed.   For non-scheduling related questions, please contact the cardiac imaging nurse navigator should you have any questions/concerns: Rockwell [place], Cardiac Imaging Nurse Navigator Larey Brick, Cardiac Imaging Nurse Navigator [facility] Direct Office Dial: [phone]   For scheduling needs, including cancellations and rescheduling, please call [place], [phone].    Follow-Up: At [facility], you and your health needs are our priority.  As part of our continuing mission to provide you with exceptional heart care, we have created designated Provider Care Teams.  These Care Teams include your primary Cardiologist (physician) and Advanced Practice Providers (APPs -  Physician Assistants and Nurse Practitioners) who all work together to provide you with the care you need, when you need it.   Your next appointment:   1 year(s)  Provider:   Maisie Fus, MD

## 2022-12-26 NOTE — Progress Notes (Signed)
Cardiology Office Note:    Date:  12/26/2022   ID:  Shawn York, DOB Oct 12, 1975, MRN [id number]  PCP:  Etta Grandchild, MD   [facility] Providers Cardiologist:  None     Referring MD: Etta Grandchild, MD   No chief complaint on file. CVA?  History of Present Illness:    Shawn York is a 47 y.o. male with a hx of ascending thoracic aortic dilation 4 cm, normal carotids,   brain glioma referral for?  I have no detailed notes. There was a concern for CVA, but was his glioma.  He denies CP or SOB. Can travel upstairs. He exercises without any issues.  No family hx of cardiac disease. Brother died of a rupture aneurysm.  He saw a cardiologist at [facility] for pre-op.  MRI brain: 1. Changes since 2020 MRI in the residual superior right temporal gyrus highly suspicious for Tumor Recurrence, including new mass-like T2/FLAIR hyperintense gyral expansion and small new multifocal nodular enhancement there. No intracranial mass effect.   2. Also new since 2020 but chronic appearing lacunar infarcts in the right basal ganglia.   3. No other acute intracranial abnormality.  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   Depression    Epistaxis    Headache    Hypertension    PTSD (post-traumatic stress disorder)    Seizures (HCC)     Past Surgical History:  Procedure Laterality Date   BASAL CELL CARCINOMA EXCISION  1995   CRANIOTOMY FOR TUMOR Right 02/25/2011   IR ANGIO EXTERNAL CAROTID SEL EXT CAROTID BILAT MOD SED  11/16/2021   IR ANGIO INTRA EXTRACRAN SEL COM CAROTID INNOMINATE UNI R MOD SED  11/16/2021   IR ANGIO INTRA EXTRACRAN SEL INTERNAL CAROTID UNI L MOD SED  11/16/2021   IR ANGIO VERTEBRAL SEL VERTEBRAL UNI R MOD SED  11/16/2021   IR ANGIOGRAM FOLLOW UP STUDY  11/17/2021   IR NEURO EACH ADD'L AFTER BASIC UNI LEFT (MS)  11/17/2021   IR NEURO EACH ADD'L AFTER  BASIC UNI RIGHT (MS)  11/17/2021   IR TRANSCATH/EMBOLIZ  11/16/2021   IR US GUIDE VASC ACCESS RIGHT  11/16/2021   RADIOLOGY WITH ANESTHESIA N/A 11/16/2021   Procedure: IR WITH ANESTHESIA;  Surgeon: Baldemar Lenis, MD;  Location: [facility] OR;  Service: Radiology;  Laterality: N/A;   TOTAL HIP ARTHROPLASTY Right 12/21/2021   Procedure: RIGHT TOTAL HIP REPLACEMENT;  Surgeon: Tarry Kos, MD;  Location: [facility] OR;  Service: Orthopedics;  Laterality: Right;  3-C   WISDOM TOOTH EXTRACTION      Current Medications: No outpatient medications have been marked as taking for the 12/26/22 encounter (Appointment) with Maisie Fus, MD.     Allergies:   Iodinated contrast media, Penicillins, Shellfish allergy, and Shellfish-derived products   Social History   Socioeconomic History   Marital status: Married    Spouse name: Not on file   Number of children: Not on file   Years of education: Not on file   Highest education level: Not on file  Occupational History   Not on file  Tobacco Use   Smoking status: Former    Types: Cigarettes    Quit date: 02/25/2011    Years since quitting: 11.8   Smokeless tobacco: Never  Vaping Use   Vaping Use: Never used  Substance and Sexual Activity  Alcohol use: Not Currently    Alcohol/week: 14.0 standard drinks of alcohol    Types: 14 Cans of beer per week    Comment: quit 11-08-2021   Drug use: No   Sexual activity: Yes    Comment: E-cigarette users  Other Topics Concern   Not on file  Social History Narrative   Not on file   Social Determinants of Health   Financial Resource Strain: Not on file  Food Insecurity: Not on file  Transportation Needs: Not on file  Physical Activity: Not on file  Stress: Not on file  Social Connections: Not on file     Family History: The patient's family history includes Alcohol abuse in his brother; Alzheimer's disease in his paternal grandmother; Cancer in his father and another family member; Cancer  (age of onset: 65) in his maternal grandmother; Dementia in his paternal grandmother; Heart disease in his paternal grandfather.  ROS:   Please see the history of present illness.     All other systems reviewed and are negative.  EKGs/Labs/Other Studies Reviewed:    The following studies were reviewed today:   EKG:  EKG is  ordered today.  The ekg ordered today demonstrates   12/26/2022- NSR  Recent Labs: 02/28/2022: Hemoglobin 14.6; Platelets 187.0  Recent Lipid Panel    Component Value Date/Time   CHOL 213 (H) 05/17/2021 1434   TRIG 271.0 (H) 05/17/2021 1434   HDL 53.60 05/17/2021 1434   CHOLHDL 4 05/17/2021 1434   VLDL 54.2 (H) 05/17/2021 1434   LDLDIRECT 127.0 05/17/2021 1434     Risk Assessment/Calculations:     Physical Exam:    VS:  There were no vitals taken for this visit.    Wt Readings from Last 3 Encounters:  11/02/22 218 lb (98.9 kg)  03/31/22 198 lb (89.8 kg)  02/28/22 198 lb (89.8 kg)     GEN:  Well nourished, well developed in no acute distress HEENT: Normal NECK: No JVD CARDIAC: RRR, no murmurs, rubs, gallops RESPIRATORY:  Clear to auscultation without rales, wheezing or rhonchi  ABDOMEN: Soft, non-tender, non-distended MUSCULOSKELETAL:  No edema; No deformity  SKIN: Warm and dry NEUROLOGIC:  Alert and oriented x 3 PSYCHIATRIC:  Normal affect   ASSESSMENT:   Pre-OP: neurology requested an echocardiogram prior to glioma resection. Will obtain this. Otherwise no further cardiac testing indicated at this time.  Aortic Dilation: ascending thoracic aortic dilation. CTA planned for 01/2023.  PLAN:    In order of problems listed above:  TTE CTA aortic dilation 01/2023      Medication Adjustments/Labs and Tests Ordered: Current medicines are reviewed at length with the patient today.  Concerns regarding medicines are outlined above.  No orders of the defined types were placed in this encounter.  No orders of the defined types were placed in  this encounter.   There are no Patient Instructions on file for this visit.   Signed, Maisie Fus, MD  12/26/2022 7:28 AM    [facility]

## 2022-12-27 DIAGNOSIS — E222 Syndrome of inappropriate secretion of antidiuretic hormone: Secondary | ICD-10-CM | POA: Diagnosis not present

## 2022-12-27 DIAGNOSIS — Z96641 Presence of right artificial hip joint: Secondary | ICD-10-CM | POA: Diagnosis not present

## 2022-12-27 DIAGNOSIS — I609 Nontraumatic subarachnoid hemorrhage, unspecified: Secondary | ICD-10-CM | POA: Diagnosis not present

## 2022-12-27 DIAGNOSIS — G40909 Epilepsy, unspecified, not intractable, without status epilepticus: Secondary | ICD-10-CM | POA: Diagnosis not present

## 2022-12-27 DIAGNOSIS — C712 Malignant neoplasm of temporal lobe: Secondary | ICD-10-CM | POA: Diagnosis not present

## 2022-12-27 DIAGNOSIS — G936 Cerebral edema: Secondary | ICD-10-CM | POA: Diagnosis not present

## 2022-12-27 DIAGNOSIS — D72829 Elevated white blood cell count, unspecified: Secondary | ICD-10-CM | POA: Diagnosis not present

## 2022-12-27 DIAGNOSIS — K5909 Other constipation: Secondary | ICD-10-CM | POA: Diagnosis not present

## 2022-12-27 DIAGNOSIS — I10 Essential (primary) hypertension: Secondary | ICD-10-CM | POA: Diagnosis not present

## 2022-12-27 DIAGNOSIS — D496 Neoplasm of unspecified behavior of brain: Secondary | ICD-10-CM | POA: Diagnosis not present

## 2022-12-27 DIAGNOSIS — G8194 Hemiplegia, unspecified affecting left nondominant side: Secondary | ICD-10-CM | POA: Diagnosis not present

## 2022-12-27 DIAGNOSIS — Z9889 Other specified postprocedural states: Secondary | ICD-10-CM | POA: Diagnosis not present

## 2022-12-27 DIAGNOSIS — F1010 Alcohol abuse, uncomplicated: Secondary | ICD-10-CM | POA: Diagnosis not present

## 2022-12-27 DIAGNOSIS — I611 Nontraumatic intracerebral hemorrhage in hemisphere, cortical: Secondary | ICD-10-CM | POA: Diagnosis not present

## 2022-12-27 DIAGNOSIS — G43001 Migraine without aura, not intractable, with status migrainosus: Secondary | ICD-10-CM | POA: Diagnosis not present

## 2022-12-27 DIAGNOSIS — R1310 Dysphagia, unspecified: Secondary | ICD-10-CM | POA: Diagnosis not present

## 2022-12-27 DIAGNOSIS — R29810 Facial weakness: Secondary | ICD-10-CM | POA: Diagnosis not present

## 2022-12-27 DIAGNOSIS — Z01818 Encounter for other preprocedural examination: Secondary | ICD-10-CM | POA: Diagnosis not present

## 2022-12-27 DIAGNOSIS — F418 Other specified anxiety disorders: Secondary | ICD-10-CM | POA: Diagnosis not present

## 2022-12-27 DIAGNOSIS — D6489 Other specified anemias: Secondary | ICD-10-CM | POA: Diagnosis not present

## 2022-12-27 DIAGNOSIS — I63112 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: Secondary | ICD-10-CM | POA: Diagnosis not present

## 2022-12-27 DIAGNOSIS — I6200 Nontraumatic subdural hemorrhage, unspecified: Secondary | ICD-10-CM | POA: Diagnosis not present

## 2022-12-27 DIAGNOSIS — F4310 Post-traumatic stress disorder, unspecified: Secondary | ICD-10-CM | POA: Diagnosis not present

## 2022-12-27 DIAGNOSIS — F32A Depression, unspecified: Secondary | ICD-10-CM | POA: Diagnosis not present

## 2022-12-27 DIAGNOSIS — F419 Anxiety disorder, unspecified: Secondary | ICD-10-CM | POA: Diagnosis not present

## 2022-12-27 DIAGNOSIS — I7121 Aneurysm of the ascending aorta, without rupture: Secondary | ICD-10-CM | POA: Diagnosis not present

## 2022-12-28 ENCOUNTER — Telehealth: Payer: Self-pay | Admitting: Internal Medicine

## 2022-12-28 ENCOUNTER — Encounter: Payer: Self-pay | Admitting: Cardiology

## 2022-12-28 NOTE — Telephone Encounter (Signed)
Office calling to inquire about pt's Echo. They would like to know if pt needs to have Echo done before scheduled procedure tomorrow? Shew would like a callback regarding this matter as soon as possible.Please advise

## 2022-12-28 NOTE — Telephone Encounter (Signed)
Error

## 2022-12-28 NOTE — Telephone Encounter (Signed)
Shawn York from Shawn York [facility] ask for patient to have craniotomy withOUT echo.  He is scheduled tomorrow at 11am.  This is for a re-acurring brain tumor and resection.  Significant history and family history.  This is a surgery with general anesthesia. She states he had episode of epistaxis in March and was worked up completely.  She states they attempted to get Echo yesterday at [facility], but no availability at there 3 locations.  She ask for Dr Wyline Mood to clear without the echo.  Spoke with Dr Wyline Mood and she states that echo was due to [facility] recommendation.  She is OK for patient to have surgery without the ECHO prior to surgery.    Call back to [facility] and updated her with information that he is cleared for surgery without the echo.  She states understanding.

## 2022-12-29 DIAGNOSIS — C712 Malignant neoplasm of temporal lobe: Secondary | ICD-10-CM | POA: Diagnosis not present

## 2022-12-29 HISTORY — PX: BRAIN SURGERY: SHX531

## 2023-01-05 ENCOUNTER — Encounter: Payer: Self-pay | Admitting: Physical Therapy

## 2023-01-05 ENCOUNTER — Other Ambulatory Visit: Payer: Self-pay | Admitting: Physician Assistant

## 2023-01-05 DIAGNOSIS — C719 Malignant neoplasm of brain, unspecified: Secondary | ICD-10-CM

## 2023-01-05 NOTE — PMR Pre-admission (Signed)
PMR Admission Coordinator Pre-Admission Assessment  Patient: Shawn York is an 47 y.o., male MRN: [id number] DOB: [birth year] Height: 6\' 1"  (1.854 m) Weight: 98.2 kg  Insurance Information HMO:     PPO: yes     PCP:      IPA:      80/20:      OTHER:  PRIMARY: [employer] [place]      Policy#: [id number]      Subscriber: pt CM Name: Florentina Addison      Phone#: [phone]     Fax#: [phone] (confirmed) Pre-Cert#: [id number] auth for CIR via fax from Katie with updates due to fax listed above on 01/11/23      Employer:  Benefits:  Phone #: [phone]     Name:  Eff. Date: 02/26/22     Deduct: $500 (met)      Out of Pocket Max: $3500 (met [id number])      Life Max: n/a CIR: 80%      SNF: 80% Outpatient:      Co-Pay: $25/visit Home Health: 80%      Co-Pay: 20% DME: 80%     Co-Pay: 20% Providers:  SECONDARY: [employer] [place]      Policy#: [id number]      Subscriber: pt CM Name: Amy      Phone#: [phone]     Fax#: [phone] Pre-Cert#: [id number] auth for CIR via fax from Amy with [employer] of [place] with updates due to fax listed above on 01/18/23      Employer:  Benefits:  Phone #: [phone]     Name:  Eff. Date: 08/29/22     Deduct: $4000 (met)      Out of Pocket Max: $6000 ($4600.27)      Life Max: n/a CIR: 90%      SNF: 90% Outpatient: 90%     Co-Pay: 10% Home Health: 90%      Co-Pay: 10% DME: 90%     Co-Pay: 10% Providers:    Artist:       Phone#:   The Engineer, materials Information Summary" for patients in Inpatient Rehabilitation Facilities with attached "Privacy Act Statement-Health Care Records" was provided and verbally reviewed with: N?A  Emergency Contact Information Contact Information     Name Relation Home Work Mobile   [name],Margaret Spouse [phone]         Current Medical History  Patient Admitting Diagnosis: s/p resection of R temporal glioma  History of Present Illness: Pt is a 47 y/o male with PMH of AVN in bilateral hips s/p R THA in 11/23, ascending aortic  aneurysm, HTN, seizures, anemia, former smoker, alcohol use, PTSD, and 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  Pt has been followed with serial MRIs which have showed an increase in tumor size accompanied by auras/deja vu, and other symptoms similar to his prior tumor presentation.  He presented to [facility] on 12/29/22 for resection of this tumor via craniotomy.  Post op course complicated by L hemiparesis and L facial droop.  Therapy evaluations completed and pt was recommended for CIR.     Patient's medical record from [facility] has been reviewed by the rehabilitation admission coordinator and physician.  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 Yes  Family History   family history includes Alcohol abuse in his brother; Alzheimer's disease in his paternal grandmother; Cancer in his father and another family member; Cancer (age of onset: 75) in his maternal grandmother; Dementia in his paternal grandmother; Heart disease in his paternal grandfather.  Current Medications  Current Outpatient Medications:    b complex vitamins capsule, Take 1 capsule by mouth daily., Disp: , Rfl:    brexpiprazole (REXULTI) 2 MG TABS tablet, Take 1 tablet (2 mg total) by mouth daily. Take 0.5 mg daily for one week, then increase to 1 mg daily, Disp: 30 tablet, Rfl: 1   busPIRone (BUSPAR) 30 MG tablet, Take 1 tablet (30 mg total) by mouth 2 (two) times daily., Disp: 180 tablet, Rfl: 1   diphenhydrAMINE (BENADRYL) 50 MG tablet, Take 2 tab of 50  mg prior to CT angiogram., Disp: 2 tablet, Rfl: 0   doxazosin (CARDURA) 4 MG tablet, Take 1 tablet (4 mg total) by mouth at bedtime., Disp: 90 tablet, Rfl: 1   EPINEPHrine 0.3 mg/0.3 mL IJ SOAJ injection, Inject 0.3 mg into the muscle once as needed (for a severe, allergic reaction). , Disp: , Rfl:    levocetirizine (XYZAL) 5 MG tablet, TAKE 1 TABLET BY MOUTH EVERY DAY IN THE EVENING, Disp: 90 tablet, Rfl: 1   metoprolol tartrate (LOPRESSOR) 50 MG tablet, Take 2 hours before CT scan, Disp: 1 tablet, Rfl: 0   nebivolol (BYSTOLIC) 5 MG tablet, TAKE 1 TABLET (5 MG TOTAL) BY MOUTH DAILY., Disp: 90 tablet, Rfl: 0   Oxcarbazepine (TRILEPTAL) 300 MG tablet, Take 1 tablet (300 mg total) by mouth 2 (two) times daily., Disp: 60 tablet, Rfl: 0   predniSONE (DELTASONE) 50 MG tablet, Take 50 mg (one tablet) 13 hours before procedure. Take 50 mg (once tablet) 7 house before procedure. Take 50 mg (one tablet) before leaving home before procedure, as well as 50 mg of Benadryl., Disp: 3 tablet, Rfl: 0   thiamine 100 MG tablet, Take 1 tablet (100 mg total) by mouth daily., Disp: 90 tablet, Rfl: 1  Patients Current Diet: Diet reg/thin  Precautions / Restrictions Precautions: Fall Weight Bearing Restrictions: No    Has the patient had 2 or more falls or a fall with injury in the past year? No  Prior Activity Level Community (5-7x/wk): fully indepedent, working FT, driving, 2 young children   Prior Functional Level Self Care: Did the patient need help bathing, dressing, using the toilet or eating? Independent  Indoor Mobility: Did the patient need assistance with walking from room to room (with or without device)? Independent  Stairs: Did the patient need assistance with internal or external stairs (with or without device)? Independent  Functional Cognition: Did the patient need help planning regular tasks such as shopping or remembering to take medications? Independent  Patient Information Are you of  Hispanic, Latino/a,or Spanish origin?: A. No, not of Hispanic, Latino/a, or Spanish origin What is your race?: A. White Do you need or want an interpreter to communicate with a doctor or health care staff?: 0. No  Patient's Response To:  Health Literacy and Transportation Is the patient able to respond to health literacy and transportation needs?: Yes Health Literacy - How often do you need to  have someone help you when you read instructions, pamphlets, or other written material from your doctor or pharmacy?: Sometimes In the past 12 months, has lack of transportation kept you from medical appointments or from getting medications?: No In the past 12 months, has lack of transportation kept you from meetings, work, or from getting things needed for daily living?: No  Home Assistive Devices / Equipment None   Prior Device Use: Indicate devices/aids used by the patient prior to current illness, exacerbation or injury? None of the above   Prior Functional Level Current Functional Level  Bed Mobility  Independent Min assist   Transfers  Independent  Min assist   Mobility - Walk/Wheelchair  Independent  Min assist   Upper Body Dressing  Independent  Min assist   Lower Body Dressing  Independent  Mod assist   Grooming  Independent  Min assist   Eating/Drinking  Independent  Min assist   Toilet Transfer  Independent  Min assist   Bladder Continence   continent  continent   Bowel Management  continent  continent   Stair Climbing  Independent      Communication  indep      Memory  indep       Special Needs/ Care Considerations Skin crani incision  Previous Home Environment (from acute therapy documentation) Living Arrangements: Spouse/significant other; Children Available Help at Discharge: Family; Available 24 hours/day Type of Home: House Home Layout: One level Home Access: Stairs to enter Entrance Stairs-Rails: None Entrance Stairs-Number of  Steps: 2-5 Bathroom Shower/Tub: Associate Professor: Yes How Accessible: Accessible via walker Home Care Services: No Additional Comments: pt's dad building HR for stairs   Discharge Living Setting Plans for Discharge Living Setting: Patient's home Type of Home at Discharge: House Discharge Home Layout: One level Discharge Home Access: Stairs to enter Entrance Stairs-Rails: Right; Left Entrance Stairs-Number of Steps: 2-5 Discharge Bathroom Shower/Tub: Tub/shower unit Discharge Bathroom Toilet: Standard Discharge Bathroom Accessibility: Yes How Accessible: Accessible via walker Does the patient have any problems obtaining your medications?: No   Social/Family/Support Systems Patient Roles: Parent; Spouse Contact Information: pt has 2 children ages 67 and 7 Anticipated Caregiver: spouse, [name], will be primary, also has support from his parents and his in-laws Anticipated Caregiver's Contact Information: [name] [phone] Ability/Limitations of Caregiver: none stated Caregiver Availability: 24/7 Discharge Plan Discussed with Primary Caregiver: Yes Is Caregiver In Agreement with Plan?: Yes Does Caregiver/Family have Issues with Lodging/Transportation while Pt is in Rehab?: No   Goals Patient/Family Goal for Rehab: PT/OT supervision, SLP n/a Expected length of stay: 10-14 days Additional Information: Discharge plan: return to pt's home with spouse as primary caregiver, assist from pt's parents and spouse's parents as well Pt/Family Agrees to Admission and willing to participate: Yes Program Orientation Provided & Reviewed with Pt/Caregiver Including Roles  & Responsibilities: Yes Additional Information Needs: previous glioma resection in 2012  Barriers to Discharge: Home environment access/layout; Insurance for SNF coverage   Decrease burden of Care through IP rehab admission: n/a  Possible need for SNF placement upon  discharge: Not anticipated.  Plan home with spouse providing 24/7, assist from his parents and in laws as well.   Patient Condition: I have reviewed medical records from [facility], spoken with CM, and spouse. I discussed via phone for inpatient rehabilitation assessment.  Patient will benefit from ongoing PT and OT, can actively participate in 3 hours of therapy a day 5 days of the week, and can make measurable  gains during the admission.  Patient will also benefit from the coordinated team approach during an Inpatient Acute Rehabilitation admission.  The patient will receive intensive therapy as well as Rehabilitation physician, nursing, social worker, and care management interventions.  Due to safety, skin/wound care, disease management, medication administration, pain management, and patient education the patient requires 24 hour a day rehabilitation nursing.  The patient is currently min assist with mobility and basic ADLs.  Discharge setting and therapy post discharge at home with home health is anticipated.  Patient has agreed to participate in the Acute Inpatient Rehabilitation Program and will admit today.  Preadmission Screen Completed By:  Stephania Fragmin, PT, DPT 01/06/2023 10:40 AM ______________________________________________________________________   Discussed status with Dr. Shearon Stalls on 01/06/23  at 10:40 AM  and received approval for admission today.  Admission Coordinator:  Stephania Fragmin, PT, time 10:40 AM Dorna Bloom 01/06/23    Assessment/Plan: Diagnosis: Does the need for close, 24 hr/day Medical supervision in concert with the patient's rehab needs make it unreasonable for this patient to be served in a less intensive setting? Yes Co-Morbidities requiring supervision/potential complications: S/p cradiotomy for recurrent glioma resection, on steroid taper for edema, seizures, poor pain control, hypertension, hyponatremia, constipation, alcohol use disorder, PTSD Due to bladder management,  bowel management, safety, skin/wound care, disease management, medication administration, pain management, and patient education, does the patient require 24 hr/day rehab nursing? Yes Does the patient require coordinated care of a physician, rehab nurse, PT, OT to address physical and functional deficits in the context of the above medical diagnosis(es)? Yes Addressing deficits in the following areas: balance, endurance, locomotion, strength, transferring, bowel/bladder control, bathing, dressing, feeding, grooming, and toileting Can the patient actively participate in an intensive therapy program of at least 3 hrs of therapy 5 days a week? Yes The potential for patient to make measurable gains while on inpatient rehab is good Anticipated functional outcomes upon discharge from inpatient rehab: supervision PT, supervision OT,  Estimated rehab length of stay to reach the above functional goals is: 10-14 days Anticipated discharge destination: Home 10. Overall Rehab/Functional Prognosis: good  MD Signature  Angelina Sheriff, DO 01/06/2023

## 2023-01-06 ENCOUNTER — Other Ambulatory Visit: Payer: Self-pay

## 2023-01-06 ENCOUNTER — Inpatient Hospital Stay (HOSPITAL_COMMUNITY): Payer: BC Managed Care – PPO

## 2023-01-06 ENCOUNTER — Encounter (HOSPITAL_COMMUNITY): Payer: Self-pay | Admitting: Physical Medicine and Rehabilitation

## 2023-01-06 ENCOUNTER — Inpatient Hospital Stay (HOSPITAL_COMMUNITY)
Admission: RE | Admit: 2023-01-06 | Discharge: 2023-01-18 | DRG: 057 | Disposition: A | Discharge status: Home / Self Care | Payer: BC Managed Care – PPO | Source: Intra-hospital | Attending: Physical Medicine and Rehabilitation | Admitting: Physical Medicine and Rehabilitation

## 2023-01-06 DIAGNOSIS — I69022 Dysarthria following nontraumatic subarachnoid hemorrhage: Secondary | ICD-10-CM | POA: Diagnosis not present

## 2023-01-06 DIAGNOSIS — K5900 Constipation, unspecified: Secondary | ICD-10-CM | POA: Diagnosis present

## 2023-01-06 DIAGNOSIS — I69092 Facial weakness following nontraumatic subarachnoid hemorrhage: Secondary | ICD-10-CM

## 2023-01-06 DIAGNOSIS — E222 Syndrome of inappropriate secretion of antidiuretic hormone: Secondary | ICD-10-CM | POA: Diagnosis present

## 2023-01-06 DIAGNOSIS — Z79899 Other long term (current) drug therapy: Secondary | ICD-10-CM | POA: Diagnosis not present

## 2023-01-06 DIAGNOSIS — I63112 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: Secondary | ICD-10-CM

## 2023-01-06 DIAGNOSIS — F4310 Post-traumatic stress disorder, unspecified: Secondary | ICD-10-CM | POA: Diagnosis not present

## 2023-01-06 DIAGNOSIS — F17211 Nicotine dependence, cigarettes, in remission: Secondary | ICD-10-CM | POA: Diagnosis not present

## 2023-01-06 DIAGNOSIS — R471 Dysarthria and anarthria: Secondary | ICD-10-CM | POA: Diagnosis present

## 2023-01-06 DIAGNOSIS — Z96641 Presence of right artificial hip joint: Secondary | ICD-10-CM | POA: Diagnosis present

## 2023-01-06 DIAGNOSIS — Z91013 Allergy to seafood: Secondary | ICD-10-CM

## 2023-01-06 DIAGNOSIS — R32 Unspecified urinary incontinence: Secondary | ICD-10-CM | POA: Diagnosis present

## 2023-01-06 DIAGNOSIS — C729 Malignant neoplasm of central nervous system, unspecified: Secondary | ICD-10-CM | POA: Diagnosis not present

## 2023-01-06 DIAGNOSIS — F32A Depression, unspecified: Secondary | ICD-10-CM | POA: Diagnosis present

## 2023-01-06 DIAGNOSIS — I10 Essential (primary) hypertension: Secondary | ICD-10-CM | POA: Diagnosis not present

## 2023-01-06 DIAGNOSIS — Z4889 Encounter for other specified surgical aftercare: Secondary | ICD-10-CM

## 2023-01-06 DIAGNOSIS — F418 Other specified anxiety disorders: Secondary | ICD-10-CM

## 2023-01-06 DIAGNOSIS — Z91041 Radiographic dye allergy status: Secondary | ICD-10-CM | POA: Diagnosis not present

## 2023-01-06 DIAGNOSIS — C712 Malignant neoplasm of temporal lobe: Secondary | ICD-10-CM | POA: Diagnosis not present

## 2023-01-06 DIAGNOSIS — Z87891 Personal history of nicotine dependence: Secondary | ICD-10-CM | POA: Diagnosis not present

## 2023-01-06 DIAGNOSIS — Z85841 Personal history of malignant neoplasm of brain: Secondary | ICD-10-CM

## 2023-01-06 DIAGNOSIS — R059 Cough, unspecified: Secondary | ICD-10-CM

## 2023-01-06 DIAGNOSIS — C719 Malignant neoplasm of brain, unspecified: Secondary | ICD-10-CM | POA: Diagnosis not present

## 2023-01-06 DIAGNOSIS — Z85828 Personal history of other malignant neoplasm of skin: Secondary | ICD-10-CM

## 2023-01-06 DIAGNOSIS — R053 Chronic cough: Secondary | ICD-10-CM | POA: Diagnosis present

## 2023-01-06 DIAGNOSIS — N179 Acute kidney failure, unspecified: Secondary | ICD-10-CM | POA: Diagnosis not present

## 2023-01-06 DIAGNOSIS — R4789 Other speech disturbances: Secondary | ICD-10-CM

## 2023-01-06 DIAGNOSIS — Z8249 Family history of ischemic heart disease and other diseases of the circulatory system: Secondary | ICD-10-CM | POA: Diagnosis not present

## 2023-01-06 DIAGNOSIS — M6250 Muscle wasting and atrophy, not elsewhere classified, unspecified site: Secondary | ICD-10-CM | POA: Diagnosis not present

## 2023-01-06 DIAGNOSIS — Z880 Allergy status to penicillin: Secondary | ICD-10-CM

## 2023-01-06 DIAGNOSIS — Z820 Family history of epilepsy and other diseases of the nervous system: Secondary | ICD-10-CM

## 2023-01-06 DIAGNOSIS — R29810 Facial weakness: Secondary | ICD-10-CM | POA: Diagnosis present

## 2023-01-06 DIAGNOSIS — R531 Weakness: Secondary | ICD-10-CM | POA: Diagnosis not present

## 2023-01-06 DIAGNOSIS — G479 Sleep disorder, unspecified: Secondary | ICD-10-CM | POA: Diagnosis not present

## 2023-01-06 DIAGNOSIS — Z808 Family history of malignant neoplasm of other organs or systems: Secondary | ICD-10-CM | POA: Diagnosis not present

## 2023-01-06 DIAGNOSIS — K148 Other diseases of tongue: Secondary | ICD-10-CM | POA: Diagnosis present

## 2023-01-06 DIAGNOSIS — I69098 Other sequelae following nontraumatic subarachnoid hemorrhage: Secondary | ICD-10-CM | POA: Diagnosis not present

## 2023-01-06 DIAGNOSIS — E871 Hypo-osmolality and hyponatremia: Secondary | ICD-10-CM | POA: Diagnosis not present

## 2023-01-06 DIAGNOSIS — G4700 Insomnia, unspecified: Secondary | ICD-10-CM | POA: Diagnosis present

## 2023-01-06 DIAGNOSIS — F419 Anxiety disorder, unspecified: Secondary | ICD-10-CM | POA: Diagnosis present

## 2023-01-06 DIAGNOSIS — R2689 Other abnormalities of gait and mobility: Secondary | ICD-10-CM | POA: Diagnosis not present

## 2023-01-06 LAB — MAGNESIUM: Magnesium: 2.1 mg/dL (ref 1.7–2.4)

## 2023-01-06 LAB — GLUCOSE, CAPILLARY
Glucose-Capillary: 96 mg/dL (ref 70–99)
Glucose-Capillary: 91 mg/dL (ref 70–99)

## 2023-01-06 MED ORDER — FLUTICASONE PROPIONATE 50 MCG/ACT NA SUSP: 2.0000 | Freq: Every day | NASAL | Status: DC

## 2023-01-06 MED ORDER — METHOCARBAMOL 500 MG PO TABS
500.0000 mg | ORAL_TABLET | Freq: Four times a day (QID) | ORAL | Status: DC | PRN
  Administered 2023-01-07 – 2023-01-10 (×6): 500 mg via ORAL
  Filled 2023-01-06 (×6): qty 1

## 2023-01-06 MED ORDER — SORBITOL 70 % SOLN: 30.0000 mL | Freq: Every day | Status: DC | PRN

## 2023-01-06 MED ORDER — ONDANSETRON HCL 4 MG/2ML IJ SOLN: 4.0000 mg | Freq: Four times a day (QID) | INTRAMUSCULAR | Status: DC | PRN

## 2023-01-06 MED ORDER — BUSPIRONE HCL 15 MG PO TABS
30.0000 mg | ORAL_TABLET | Freq: Two times a day (BID) | ORAL | Status: DC
  Administered 2023-01-06 – 2023-01-18 (×24): 30 mg via ORAL
  Filled 2023-01-06 (×24): qty 2

## 2023-01-06 MED ORDER — DEXAMETHASONE 4 MG PO TABS
2.0000 mg | ORAL_TABLET | Freq: Two times a day (BID) | ORAL | Status: AC
  Administered 2023-01-09 – 2023-01-10 (×4): 2 mg via ORAL
  Filled 2023-01-06 (×4): qty 1

## 2023-01-06 MED ORDER — DEXAMETHASONE 4 MG PO TABS: 2.0000 mg | ORAL_TABLET | Freq: Every day | ORAL | Status: DC

## 2023-01-06 MED ORDER — MELATONIN 3 MG PO TABS
3.0000 mg | ORAL_TABLET | Freq: Every day | ORAL | Status: DC
  Administered 2023-01-06 – 2023-01-11 (×6): 3 mg via ORAL
  Filled 2023-01-06 (×6): qty 1

## 2023-01-06 MED ORDER — MAGNESIUM CITRATE PO SOLN: 1.0000 | Freq: Once | ORAL | Status: DC | PRN

## 2023-01-06 MED ORDER — NAPHAZOLINE-GLYCERIN 0.012-0.25 % OP SOLN
1.0000 [drp] | Freq: Three times a day (TID) | OPHTHALMIC | Status: DC
  Administered 2023-01-06 – 2023-01-07 (×3): 1 [drp] via OPHTHALMIC
  Administered 2023-01-07: 2 [drp] via OPHTHALMIC
  Administered 2023-01-08 – 2023-01-09 (×5): 1 [drp] via OPHTHALMIC
  Administered 2023-01-10 – 2023-01-11 (×2): 2 [drp] via OPHTHALMIC
  Administered 2023-01-11 – 2023-01-12 (×3): 1 [drp] via OPHTHALMIC
  Administered 2023-01-12: 2 [drp] via OPHTHALMIC
  Administered 2023-01-13: 1 [drp] via OPHTHALMIC
  Administered 2023-01-13: 2 [drp] via OPHTHALMIC
  Administered 2023-01-13 – 2023-01-14 (×2): 1 [drp] via OPHTHALMIC
  Administered 2023-01-14 (×2): 2 [drp] via OPHTHALMIC
  Administered 2023-01-15: 1 [drp] via OPHTHALMIC
  Administered 2023-01-15 – 2023-01-17 (×6): 2 [drp] via OPHTHALMIC
  Administered 2023-01-17: 1 [drp] via OPHTHALMIC
  Administered 2023-01-17 – 2023-01-18 (×2): 2 [drp] via OPHTHALMIC
  Filled 2023-01-06 (×2): qty 15

## 2023-01-06 MED ORDER — MAGNESIUM HYDROXIDE 400 MG/5ML PO SUSP: 30.0000 mL | Freq: Every day | ORAL | Status: DC | PRN

## 2023-01-06 MED ORDER — HEPARIN SODIUM (PORCINE) 5000 UNIT/ML IJ SOLN
5000.0000 [IU] | Freq: Three times a day (TID) | INTRAMUSCULAR | Status: DC
  Administered 2023-01-06 – 2023-01-18 (×35): 5000 [IU] via SUBCUTANEOUS
  Filled 2023-01-06 (×35): qty 1

## 2023-01-06 MED ORDER — TRAZODONE HCL 50 MG PO TABS: 50.0000 mg | ORAL_TABLET | Freq: Every evening | ORAL | Status: DC | PRN

## 2023-01-06 MED ORDER — UREA 15 G PO PACK
15.0000 g | PACK | Freq: Two times a day (BID) | ORAL | Status: DC
  Administered 2023-01-06 – 2023-01-09 (×6): 15 g via ORAL
  Filled 2023-01-06 (×7): qty 1

## 2023-01-06 MED ORDER — ALUM & MAG HYDROXIDE-SIMETH 200-200-20 MG/5ML PO SUSP: 30.0000 mL | ORAL | Status: DC | PRN

## 2023-01-06 MED ORDER — SENNOSIDES-DOCUSATE SODIUM 8.6-50 MG PO TABS
2.0000 | ORAL_TABLET | Freq: Two times a day (BID) | ORAL | Status: DC
  Administered 2023-01-06 – 2023-01-18 (×22): 2 via ORAL
  Filled 2023-01-06 (×25): qty 2

## 2023-01-06 MED ORDER — POLYETHYLENE GLYCOL 3350 17 G PO PACK: 17.0000 g | PACK | Freq: Every day | ORAL | Status: DC

## 2023-01-06 MED ORDER — SENNOSIDES-DOCUSATE SODIUM 8.6-50 MG PO TABS: 2.0000 | ORAL_TABLET | Freq: Every day | ORAL | Status: DC

## 2023-01-06 MED ORDER — INSULIN ASPART 100 UNIT/ML IJ SOLN: 0.0000 [IU] | Freq: Three times a day (TID) | INTRAMUSCULAR | Status: DC

## 2023-01-06 MED ORDER — DEXAMETHASONE 4 MG PO TABS
2.0000 mg | ORAL_TABLET | Freq: Every day | ORAL | Status: AC
  Administered 2023-01-11 – 2023-01-12 (×2): 2 mg via ORAL
  Filled 2023-01-06 (×2): qty 1

## 2023-01-06 MED ORDER — BENZONATATE 100 MG PO CAPS: 100.0000 mg | ORAL_CAPSULE | Freq: Three times a day (TID) | ORAL | Status: DC | PRN

## 2023-01-06 MED ORDER — POLYETHYLENE GLYCOL 3350 17 G PO PACK
17.0000 g | PACK | Freq: Two times a day (BID) | ORAL | Status: DC
  Administered 2023-01-06 – 2023-01-17 (×21): 17 g via ORAL
  Filled 2023-01-06 (×24): qty 1

## 2023-01-06 MED ORDER — ACETAMINOPHEN 325 MG PO TABS
325.0000 mg | ORAL_TABLET | ORAL | Status: DC | PRN
  Administered 2023-01-07 – 2023-01-08 (×5): 650 mg via ORAL
  Filled 2023-01-06 (×5): qty 2

## 2023-01-06 MED ORDER — OXCARBAZEPINE 300 MG PO TABS
300.0000 mg | ORAL_TABLET | Freq: Two times a day (BID) | ORAL | Status: DC
  Administered 2023-01-06 – 2023-01-18 (×24): 300 mg via ORAL
  Filled 2023-01-06 (×24): qty 1

## 2023-01-06 MED ORDER — DEXAMETHASONE 4 MG PO TABS
4.0000 mg | ORAL_TABLET | Freq: Two times a day (BID) | ORAL | Status: AC
  Administered 2023-01-06 – 2023-01-08 (×5): 4 mg via ORAL
  Filled 2023-01-06 (×5): qty 1

## 2023-01-06 MED ORDER — SODIUM CHLORIDE 1 G PO TABS: 1.0000 g | ORAL_TABLET | Freq: Three times a day (TID) | ORAL | Status: DC

## 2023-01-06 MED ORDER — OXYCODONE HCL 5 MG PO TABS
10.0000 mg | ORAL_TABLET | Freq: Four times a day (QID) | ORAL | Status: DC | PRN
  Administered 2023-01-06 – 2023-01-10 (×10): 10 mg via ORAL
  Filled 2023-01-06 (×11): qty 2

## 2023-01-06 MED ORDER — PANTOPRAZOLE SODIUM 40 MG PO TBEC
40.0000 mg | DELAYED_RELEASE_TABLET | Freq: Every day | ORAL | Status: DC
  Administered 2023-01-07 – 2023-01-18 (×12): 40 mg via ORAL
  Filled 2023-01-06 (×12): qty 1

## 2023-01-06 MED ORDER — DOXYCYCLINE HYCLATE 100 MG PO TABS
100.0000 mg | ORAL_TABLET | Freq: Two times a day (BID) | ORAL | Status: AC
  Administered 2023-01-06 – 2023-01-18 (×24): 100 mg via ORAL
  Filled 2023-01-06 (×24): qty 1

## 2023-01-06 MED ORDER — BACITRACIN ZINC 500 UNIT/GM EX OINT
TOPICAL_OINTMENT | Freq: Every day | CUTANEOUS | Status: AC
  Administered 2023-01-06 – 2023-01-07 (×2): 31.5556 via TOPICAL
  Administered 2023-01-08: 1 via TOPICAL
  Administered 2023-01-09 – 2023-01-15 (×7): 31.5556 via TOPICAL
  Filled 2023-01-06: qty 28.4

## 2023-01-06 MED ORDER — PREGABALIN 50 MG PO CAPS
100.0000 mg | ORAL_CAPSULE | Freq: Three times a day (TID) | ORAL | Status: DC
  Administered 2023-01-06 – 2023-01-18 (×35): 100 mg via ORAL
  Filled 2023-01-06 (×35): qty 2

## 2023-01-06 MED ORDER — BREXPIPRAZOLE 2 MG PO TABS
2.0000 mg | ORAL_TABLET | Freq: Every day | ORAL | Status: DC
  Administered 2023-01-06 – 2023-01-18 (×13): 2 mg via ORAL
  Filled 2023-01-06 (×13): qty 1

## 2023-01-06 MED ORDER — POLYVINYL ALCOHOL 1.4 % OP SOLN: 1.0000 [drp] | Freq: Three times a day (TID) | OPHTHALMIC | Status: DC | PRN

## 2023-01-06 MED ORDER — GUAIFENESIN-DM 100-10 MG/5ML PO SYRP
10.0000 mL | ORAL_SOLUTION | Freq: Four times a day (QID) | ORAL | Status: DC | PRN
  Administered 2023-01-06 – 2023-01-13 (×7): 10 mL via ORAL
  Filled 2023-01-06 (×10): qty 10

## 2023-01-06 MED ORDER — LORATADINE 10 MG PO TABS
10.0000 mg | ORAL_TABLET | Freq: Every day | ORAL | Status: DC
  Administered 2023-01-07 – 2023-01-12 (×6): 10 mg via ORAL
  Filled 2023-01-06 (×6): qty 1

## 2023-01-06 MED ORDER — ONDANSETRON HCL 4 MG PO TABS: 4.0000 mg | ORAL_TABLET | Freq: Four times a day (QID) | ORAL | Status: DC | PRN

## 2023-01-06 MED ORDER — DEXAMETHASONE 4 MG PO TABS: 2.0000 mg | ORAL_TABLET | Freq: Two times a day (BID) | ORAL | Status: DC

## 2023-01-06 NOTE — Progress Notes (Signed)
Inpatient Rehabilitation Admission Medication Review by a Pharmacist  A complete drug regimen review was completed for this patient to identify any potential clinically significant medication issues.  High Risk Drug Classes Is patient taking? Indication by Medication  Antipsychotic Yes Rexulti/Trileptal - schizo/depression  Anticoagulant Yes Heparin - DVT px  Antibiotic No   Opioid Yes Oxycodone - pain  Antiplatelet No   Hypoglycemics/insulin Yes SSI - DM  Vasoactive Medication No   Chemotherapy No   Other Yes Robaxin - spasms Trazodone - sleep Buspar - anxiety Dexamethasone -  Flonase/claritin - allergy      Type of Medication Issue Identified Description of Issue Recommendation(s)  Drug Interaction(s) (clinically significant)     Duplicate Therapy     Allergy     No Medication Administration End Date     Incorrect Dose     Additional Drug Therapy Needed     Significant med changes from prior encounter (inform family/care partners about these prior to discharge).    Other       Clinically significant medication issues were identified that warrant physician communication and completion of prescribed/recommended actions by midnight of the next day:  No  Name of provider notified for urgent issues identified:   Provider Method of Notification:     Pharmacist comments:   Time spent performing this drug regimen review (minutes):  20   Ulyses Southward, PharmD, BCIDP, AAHIVP, CPP Infectious Disease Pharmacist

## 2023-01-06 NOTE — H&P (Signed)
Physical Medicine and Rehabilitation Admission H&P     CC: Functional deficits secondary to right temporal craniotomy for recurrent anaplastic oligodendroglioma with left-sided weakness post-operatively.   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 Adline Peals with 70% removal of tumor. Metronomic Temodar initiated. He completed standard radiation with concurrent temozolomide and Avastin in 2013. MRI on 11/09/2022 suspicious for progression and repeat MRI done 4 weeks later found to have FLAIR lesion. He underwent repeat right temporal craniotomy for resection of tumor on 12/29/2022 by Dr. Lavonna Monarch at [facility]. This was complicated by bleeding from a branch of the MCA and post-op motor deficits of left side and left facial droop. Follow up MRI showed subjacent extra axial hemorrhage with layering blood products in right temporal lobe resection cavity with small volume SAH and trace cytotoxic edema in resection cavity. He was loaded with Vimpat and has been transitioned back to home Trileptal.    Hyponatremia with sodium down to 129 managed with 1 L fluid restriction, hypertonic saline, Gatorade, salt tabs and Ura NA with resolution Low phosphorus levels normalized from 2.2 to 3.7. ABLA improving with Hgb up to 12.4 and platelets stable at 243. He is tolerating regular diet with thin liquids but reporting chronic cough which is ongoing as well as persistent HA. On Lyrica for HA and doxycycline x 12 days added on 05/09 for HEENT/respiratory symptoms?. He continues on decadron 4 mg BID with taper to start in 4 days. He continues on trileptal, Rexulti and buspar for management of anxiety/depression. Patient with resultant left sided weakness with sensory changes, left inattention, headaches as well  as decreased insight into deficits.  The patient requires inpatient medicine and rehabilitation evaluations and services for ongoing dysfunction secondary to right temporal craniotomy for recurrent anaplastic oligodendroglioma with decline in his overall functional status.        Review of Systems  Constitutional:  Negative for chills and fever.  HENT:  Positive for hearing loss (on right  post XRT). Negative for nosebleeds.   Eyes:  Negative for blurred vision and double vision.  Respiratory:  Positive for cough. Negative for sputum production and shortness of breath.   Cardiovascular:  Negative for chest pain and palpitations.  Gastrointestinal:  Negative for constipation, heartburn and nausea.  Genitourinary:  Negative for frequency.       Gets up 1-2 times a night  Musculoskeletal:  Negative for back pain, joint pain and myalgias.  Neurological:  Positive for sensory change, speech change and focal weakness.  Psychiatric/Behavioral:  Positive for memory loss. The patient is nervous/anxious and has insomnia.           Past Medical History:  Diagnosis Date   Anemia     Avascular necrosis (HCC)     Brain cancer (HCC) 02/25/2011    grade III anaplastic ologodendrglioma   Depression     Epistaxis     Headache     Hypertension     PTSD (post-traumatic stress disorder)     Seizures (HCC)             Past Surgical History:  Procedure Laterality Date   BASAL CELL CARCINOMA EXCISION   1995   CRANIOTOMY FOR TUMOR Right 02/25/2011   IR ANGIO EXTERNAL CAROTID SEL EXT CAROTID BILAT MOD SED   11/16/2021  IR ANGIO INTRA EXTRACRAN SEL COM CAROTID INNOMINATE UNI R MOD SED   11/16/2021   IR ANGIO INTRA EXTRACRAN SEL INTERNAL CAROTID UNI L MOD SED   11/16/2021   IR ANGIO VERTEBRAL SEL VERTEBRAL UNI R MOD SED   11/16/2021   IR ANGIOGRAM FOLLOW UP STUDY   11/17/2021   IR NEURO EACH ADD'L AFTER BASIC UNI LEFT (MS)   11/17/2021   IR NEURO EACH ADD'L AFTER BASIC UNI RIGHT (MS)   11/17/2021    IR TRANSCATH/EMBOLIZ   11/16/2021   IR US GUIDE VASC ACCESS RIGHT   11/16/2021   RADIOLOGY WITH ANESTHESIA N/A 11/16/2021    Procedure: IR WITH ANESTHESIA;  Surgeon: Baldemar Lenis, MD;  Location: [facility];  Service: Radiology;  Laterality: N/A;   TOTAL HIP ARTHROPLASTY Right 12/21/2021    Procedure: RIGHT TOTAL HIP REPLACEMENT;  Surgeon: Tarry Kos, MD;  Location: [facility] OR;  Service: Orthopedics;  Laterality: Right;  3-C   WISDOM TOOTH EXTRACTION               Family History  Problem Relation Age of Onset   Cancer Other          brain cancer   Cancer Father          skin cancer   Cancer Maternal Grandmother 9        brain cancer   Alcohol abuse Brother     Alzheimer's disease Paternal Grandmother     Dementia Paternal Grandmother     Heart disease Paternal Grandfather        Social History:  reports that he quit smoking about 11 years ago. His smoking use included cigarettes. He has never used smokeless tobacco. He reports that he does not currently use alcohol after a past usage of about 14.0 standard drinks of alcohol per week. He reports that he does not use drugs.          Allergies  Allergen Reactions   Iodinated Contrast Media Rash and Anaphylaxis   Penicillins Shortness Of Breath and Rash      Did it involve swelling of the face/tongue/throat, SOB, or low BP? Yes Did it involve sudden or severe rash/hives, skin peeling, or any reaction on the inside of your mouth or nose? No Did you need to seek medical attention at a hospital or doctor's office? No When did it last happen? Unk If all above answers are "NO", may proceed with cephalosporin use.       Shellfish Allergy Anaphylaxis and Rash   Shellfish-Derived Products Anaphylaxis and Rash            Medications Prior to Admission  Medication Sig Dispense Refill   b complex vitamins capsule Take 1 capsule by mouth daily.       brexpiprazole (REXULTI) 2 MG TABS tablet Take 1 tablet (2 mg total) by mouth  daily. Take 0.5 mg daily for one week, then increase to 1 mg daily 30 tablet 1   busPIRone (BUSPAR) 30 MG tablet Take 1 tablet (30 mg total) by mouth 2 (two) times daily. 180 tablet 1   doxazosin (CARDURA) 4 MG tablet Take 1 tablet (4 mg total) by mouth at bedtime. 90 tablet 1   levocetirizine (XYZAL) 5 MG tablet TAKE 1 TABLET BY MOUTH EVERY DAY IN THE EVENING 90 tablet 1   nebivolol (BYSTOLIC) 5 MG tablet TAKE 1 TABLET (5 MG TOTAL) BY MOUTH DAILY. 90 tablet 0   Oxcarbazepine (TRILEPTAL) 300 MG tablet  Take 1 tablet (300 mg total) by mouth 2 (two) times daily. 60 tablet 0   diphenhydrAMINE (BENADRYL) 50 MG tablet Take 2 tab of 50 mg prior to CT angiogram. 2 tablet 0   EPINEPHrine 0.3 mg/0.3 mL IJ SOAJ injection Inject 0.3 mg into the muscle once as needed (for a severe, allergic reaction).        metoprolol tartrate (LOPRESSOR) 50 MG tablet Take 2 hours before CT scan 1 tablet 0   predniSONE (DELTASONE) 50 MG tablet Take 50 mg (one tablet) 13 hours before procedure. Take 50 mg (once tablet) 7 house before procedure. Take 50 mg (one tablet) before leaving home before procedure, as well as 50 mg of Benadryl. 3 tablet 0            Home:   Functional History: Indpendent PTA.    Functional Status:  Mobility: CGA for sit to stand tranfers with extra time/cues.   Min assist for ambulation.    ADL: Supervision for grooming Min assist for UB and LB dressing Min assist for LB bathing Supervision for UB bathing.  CGA for shower.        Cognition: Cognition Orientation Level: Oriented X4     Blood pressure (!) 149/92, pulse 65, temperature (!) 97.5 F (36.4 C), temperature source Oral, resp. rate 18, height 6\' 1"  (1.854 m), weight 102.5 kg, SpO2 97 %. Physical Exam Constitutional: No apparent distress. Appropriate appearance for age.  HENT: No JVD. Neck Supple. Trachea midline. + R craniotomy Eyes: PERRLA. EOMI. Visual fields grossly intact. +HA with extreme lateral gaze  bilaterally Cardiovascular: RRR, no murmurs/rub/gallops. No Edema. Peripheral pulses 2+  Respiratory: CTAB. No rales, rhonchi, or wheezing. On RA. +dry cough Abdomen: + bowel sounds, normoactive. No distention or tenderness.  Skin: C/D/I. No apparent lesions. MSK:      No apparent deformity.       Strength:                RUE: 5/5 SA, 5/5 EF, 5/5 EE, 5/5 WE, 5/5 FF, 5/5 FA                 LUE: 4/5 throughout                RLE: 5/5 HF, 5/5 KE, 5/5 DF, 5/5 EHL, 5/5 PF                 LLE:  5-/5 HF, 5-/5 KE, 5/5 DF, 5/5 EHL, 5/5 PF   Neurologic exam:  Cognition: AAO to person, place, time and event.  + Significant motor delay in LUE > LLE Language: Fluent, No substitutions or neoglisms. No dysarthria. Names 3/3 objects correctly.  Memory: Recalls 3/3 objects at 5 minutes. No apparent deficits  Insight: Good  insight into current condition.  Mood: Pleasant affect, appropriate mood.  Sensation: To light touch intact in BL UEs and LEs  Reflexes: 2+ in BL UE and LEs. + L Hoffman's  CN: + L facial droop + L V2 sensory loss Coordination: Fine motor difficulty LUE Spasticity: MAS 0 in all extremities.      Lab Results Last 48 Hours        Results for orders placed or performed during the hospital encounter of 01/06/23 (from the past 48 hour(s))  Glucose, capillary     Status: None    Collection Time: 01/06/23  5:09 PM  Result Value Ref Range    Glucose-Capillary 96 70 - 99 mg/dL  Comment: Glucose reference range applies only to samples taken after fasting for at least 8 hours.      Imaging Results (Last 48 hours)  No results found.         Blood pressure (!) 149/92, pulse 65, temperature (!) 97.5 F (36.4 C), temperature source Oral, resp. rate 18, height 6\' 1"  (1.854 m), weight 102.5 kg, SpO2 97 %.   Medical Problem List and Plan: 1. 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 Lavonna Monarch, c/b  MCA bleeding with [facility]             -patient may shower             -ELOS/Goals: 10-14 days, supervision PT/OT   2.  Antithrombotics: -DVT/anticoagulation:  Pharmaceutical: Heparin             -antiplatelet therapy: none   3. Pain Management: Tylenol as needed; oxycodone 10 mg q 4 hours prn             --Lyrica 100 mg TID for HA   4. Mood/Behavior/Sleep: LCSW to evaluate and provide emotional support. History of depressed mood, anxiety, PTSD             -antipsychotic agents: continue Rexulti 2 mg daily             -continue Buspar 30 mg BID   5. Neuropsych/cognition: This patient is capable of making decisions on his own behalf.   6. Skin/Wound Care: Routine skin care checks             -monitor surgical incision (POD # 8 on 5/10)   7. Fluids/Electrolytes/Nutrition: Routine Is and Os. Check CMET in am and daily BMET for 5-7 days.    8: Seizure prophylaxis: Continue Trileptal 300 mg BID             --has stable auras w/epigastric rising and mild dissociation.    9: Hyponatremia: To Continue Ure NA for 10 days.  --Continue 1 Liter fluid restriction with Gatorade prn   10: Post-op craniotomy: continue steroid taper:             -Decadron 4 mg for 4 days, 2 mg BID for two days and then 2 mg once daily for one day             --appt with  05/15 10 am for Rad MRI [phone] and Dr. Burtis Junes office @ 3 pm at [facility] center [phone].     11: Persistent Cough: On Doxycycline X 12 days per recommendations. Tessalon ineffective.  --Will check CXR to rule out acute process/baseline.    12: Seasonal allergies: continue loratadine--advised to bring Xyzal from home as this works better. Continue Flonase   13: BP goals: SBP between 120-140 to allow for perfusion. If aabove 140, monitor and do not give PRNs per NS   14. Constipation: Has resolved with use of Senna and Miralax.    15. GI prophylaxis: continue Protonix       Jacquelynn Cree, PA-C 01/06/2023 I have examined the patient  independently and edited the note for HPI, ROS, exam, assessment, and plan as appropriate. I am in agreement with the above recommendations.   Angelina Sheriff, DO 01/06/2023

## 2023-01-06 NOTE — Progress Notes (Signed)
Patient arrived to unit via transport. Patient was able to take a few steps from transport bed to hospital bed. Patient alert and oriented x4. Patient denies any pain except to head when he coughs. Surgical incision to right temporal location. Site is clean, dry and intact. Patient's wife at bedside. No distress noted in patient. VSS, CBG 96.

## 2023-01-07 DIAGNOSIS — C719 Malignant neoplasm of brain, unspecified: Secondary | ICD-10-CM | POA: Diagnosis not present

## 2023-01-07 DIAGNOSIS — I10 Essential (primary) hypertension: Secondary | ICD-10-CM | POA: Diagnosis not present

## 2023-01-07 LAB — BASIC METABOLIC PANEL
Anion gap: 11 (ref 5–15)
BUN: 24 mg/dL — ABNORMAL HIGH (ref 6–20)
CO2: 23 mmol/L (ref 22–32)
Calcium: 8.7 mg/dL — ABNORMAL LOW (ref 8.9–10.3)
Chloride: 99 mmol/L (ref 98–111)
Creatinine, Ser: 0.82 mg/dL (ref 0.61–1.24)
GFR, Estimated: >60 mL/min (ref >60)
Glucose, Bld: 105 mg/dL — ABNORMAL HIGH (ref 70–99)
Potassium: 4.3 mmol/L (ref 3.5–5.1)
Sodium: 133 mmol/L — ABNORMAL LOW (ref 135–145)

## 2023-01-07 LAB — CBC WITH DIFFERENTIAL/PLATELET
Abs Immature Granulocytes: 0.84 10*3/uL — ABNORMAL HIGH (ref 0.00–0.07)
Basophils Absolute: 0 10*3/uL (ref 0.0–0.1)
Basophils Relative: 0 %
Eosinophils Absolute: 0 10*3/uL (ref 0.0–0.5)
Eosinophils Relative: 0 %
HCT: 32 % — ABNORMAL LOW (ref 39.0–52.0)
Hemoglobin: 11 g/dL — ABNORMAL LOW (ref 13.0–17.0)
Immature Granulocytes: 6 %
Lymphocytes Relative: 15 %
Lymphs Abs: 1.9 10*3/uL (ref 0.7–4.0)
MCH: 32.6 pg (ref 26.0–34.0)
MCHC: 34.4 g/dL (ref 30.0–36.0)
MCV: 95 fL (ref 80.0–100.0)
Monocytes Absolute: 1 10*3/uL (ref 0.1–1.0)
Monocytes Relative: 8 %
Neutro Abs: 9.4 10*3/uL — ABNORMAL HIGH (ref 1.7–7.7)
Neutrophils Relative %: 71 %
Platelets: 220 10*3/uL (ref 150–400)
RBC: 3.37 MIL/uL — ABNORMAL LOW (ref 4.22–5.81)
RDW: 13.4 % (ref 11.5–15.5)
WBC: 13.2 10*3/uL — ABNORMAL HIGH (ref 4.0–10.5)
nRBC: 0 % (ref 0.0–0.2)

## 2023-01-07 LAB — GLUCOSE, CAPILLARY
Glucose-Capillary: 105 mg/dL — ABNORMAL HIGH (ref 70–99)
Glucose-Capillary: 99 mg/dL (ref 70–99)
Glucose-Capillary: 122 mg/dL — ABNORMAL HIGH (ref 70–99)
Glucose-Capillary: 98 mg/dL (ref 70–99)

## 2023-01-07 NOTE — Evaluation (Signed)
Occupational Therapy Assessment and Plan  Patient Details  Name: Shawn York MRN: [id number] Date of Birth: May 22, 1976  OT Diagnosis: abnormal posture, acute pain, cognitive deficits, hemiplegia affecting non-dominant side, and muscle weakness (generalized) Rehab Potential: Rehab Potential (ACUTE ONLY): Good ELOS: 10-12   Today's Date: 01/07/2023 OT Individual Time: 4098-1191 OT Individual Time Calculation (min): 105 min     Hospital Problem: Principal Problem:   Oligodendroglioma [facility])   Past Medical History:  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   Depression    Epistaxis    Headache    Hypertension    PTSD (post-traumatic stress disorder)    Seizures (HCC)    Past Surgical History:  Past Surgical History:  Procedure Laterality Date   BASAL CELL CARCINOMA EXCISION  1995   CRANIOTOMY FOR TUMOR Right 02/25/2011   IR ANGIO EXTERNAL CAROTID SEL EXT CAROTID BILAT MOD SED  11/16/2021   IR ANGIO INTRA EXTRACRAN SEL COM CAROTID INNOMINATE UNI R MOD SED  11/16/2021   IR ANGIO INTRA EXTRACRAN SEL INTERNAL CAROTID UNI L MOD SED  11/16/2021   IR ANGIO VERTEBRAL SEL VERTEBRAL UNI R MOD SED  11/16/2021   IR ANGIOGRAM FOLLOW UP STUDY  11/17/2021   IR NEURO EACH ADD'L AFTER BASIC UNI LEFT (MS)  11/17/2021   IR NEURO EACH ADD'L AFTER BASIC UNI RIGHT (MS)  11/17/2021   IR TRANSCATH/EMBOLIZ  11/16/2021   IR US GUIDE VASC ACCESS RIGHT  11/16/2021   RADIOLOGY WITH ANESTHESIA N/A 11/16/2021   Procedure: IR WITH ANESTHESIA;  Surgeon: Baldemar Lenis, MD;  Location: [facility] OR;  Service: Radiology;  Laterality: N/A;   TOTAL HIP ARTHROPLASTY Right 12/21/2021   Procedure: RIGHT TOTAL HIP REPLACEMENT;  Surgeon: Tarry Kos, MD;  Location: [facility] OR;  Service: Orthopedics;  Laterality: Right;  3-C   WISDOM TOOTH EXTRACTION      Assessment & Plan Clinical Impression: [name] is a 47 year old male with history of AVN of b-hips s/p R-THR, HTN, PTSD, depression w/anxiety, ascending aortic aneurysm, 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 Adline Peals with 70% removal of tumor. Metronomic Temodar initiated. He completed standard radiation with concurrent temozolomide and Avastin in 2013. MRI on 11/09/2022 suspicious for progression and repeat MRI done 4 weeks later found to have FLAIR lesion. He underwent repeat right temporal craniotomy for resection of tumor on 12/29/2022 by Dr. Lavonna Monarch at [facility]. This was complicated by bleeding from a branch of the MCA and post-op motor deficits of left side and left facial droop. Follow up MRI showed subjacent extra axial hemorrhage with layering blood products in right temporal lobe resection cavity with small volume SAH and trace cytotoxic edema in resection cavity. He was loaded with Vimpat and has been transitioned back to home Trileptal.    Hyponatremia with sodium down to 129 managed with 1 L fluid restriction, hypertonic saline, Gatorade, salt tabs and Ura NA with resolution Low phosphorus levels normalized from 2.2 to 3.7. ABLA improving with Hgb up to 12.4 and platelets stable at 243. He is tolerating regular diet with thin liquids but reporting chronic cough which is ongoing as well as persistent HA. On Lyrica for HA and doxycycline x 12 days added on 05/09 for HEENT/respiratory symptoms?. He continues on decadron 4 mg BID with taper to start  in 4 days. He continues on trileptal, Rexulti and buspar for management of anxiety/depression. Patient with resultant left sided weakness with sensory changes, left inattention, headaches as well as decreased insight into deficits.  The patient requires inpatient medicine and rehabilitation evaluations and services for ongoing dysfunction secondary to right temporal craniotomy for recurrent anaplastic  oligodendroglioma with decline   Patient currently requires min with basic self-care skills secondary to muscle weakness, decreased cardiorespiratoy endurance, impaired timing and sequencing, abnormal tone, unbalanced muscle activation, and decreased coordination, and decreased standing balance, decreased postural control, hemiplegia, and decreased balance strategies.  Prior to hospitalization, patient could complete BADL/IADL with independent .  Patient will benefit from skilled intervention to increase independence with basic self-care skills and increase level of independence with iADL prior to discharge home with care partner.  Anticipate patient will require 24 hour supervision and follow up outpatient.  OT - End of Session Activity Tolerance: Tolerates 30+ min activity with multiple rests Endurance Deficit: Yes Endurance Deficit Description: Impaired endurance/activity tolerance OT Assessment Rehab Potential (ACUTE ONLY): Good OT Barriers to Discharge: Inaccessible home environment OT Patient demonstrates impairments in the following area(s): Balance;Cognition;Endurance;Motor;Safety;Vision OT Basic ADL's Functional Problem(s): Grooming;Bathing;Dressing;Toileting OT Advanced ADL's Functional Problem(s): Simple Meal Preparation OT Transfers Functional Problem(s): Toilet;Tub/Shower OT Additional Impairment(s): Fuctional Use of Upper Extremity OT Plan OT Intensity: Minimum of 1-2 x/day, 45 to 90 minutes OT Frequency: 5 out of 7 days OT Duration/Estimated Length of Stay: 10-12 OT Treatment/Interventions: Balance/vestibular training;Discharge planning;Functional electrical stimulation;Pain management;Self Care/advanced ADL retraining;Therapeutic Activities;UE/LE Coordination activities;Therapeutic Exercise;Skin care/wound managment;Patient/family education;Functional mobility training;Disease mangement/prevention;Cognitive remediation/compensation;Community reintegration;DME/adaptive equipment  instruction;Neuromuscular re-education;Visual/perceptual remediation/compensation;UE/LE Strength taining/ROM;Wheelchair propulsion/positioning;Splinting/orthotics;Psychosocial support OT Self Feeding Anticipated Outcome(s): S OT Basic Self-Care Anticipated Outcome(s): S OT Toileting Anticipated Outcome(s): S OT Bathroom Transfers Anticipated Outcome(s): S OT Recommendation Recommendations for Other Services: Therapeutic Recreation consult Therapeutic Recreation Interventions: Outing/community reintergration;Pet therapy;Stress management Patient destination: Home Follow Up Recommendations: Home health OT Equipment Recommended: 3 in 1 bedside comode;Tub/shower bench   OT Evaluation Precautions/Restrictions  Precautions Precautions: Fall Precaution Comments: L hemi, R crani Restrictions Weight Bearing Restrictions: No General Chart Reviewed: Yes Family/Caregiver Present: No Vital Signs  Pain Pain Assessment Pain Score: 6  Pain Type: Acute pain Pain Location: Head Pain Orientation: Right Home Living/Prior Functioning Home Living Available Help at Discharge: Family, Available 24 hours/day Type of Home: House Home Access: Stairs to enter Secretary/administrator of Steps: Stoop with three steps and no rail Entrance Stairs-Rails: None Home Layout: One level Bathroom Shower/Tub: Tub/shower unit, Curtain (working on getting a Brewing technologist) Teacher, early years/pre: Yes Additional Comments: Patient reports FIL could build HR for stairs  Lives With: Spouse, Daughter, Son IADL History Leisure and Hobbies: music, computers, learning Prior Function Level of Independence: Independent with basic ADLs, Independent with transfers, Independent with gait, Independent with homemaking with ambulation  Able to Take Stairs?: Yes Driving: Yes Vocation: Full time employment 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[employer], 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[employer],  etc Leisure: Hobbies-yes (Comment) Vision Baseline Vision/History: 1 Wears glasses Ability to See in Adequate Light: 0 Adequate Patient Visual Report: Eye fatigue/eye pain/headache;Diplopia Vision Assessment?: Vision impaired- to be further tested in functional context Perception  Perception: Within Functional Limits Praxis Praxis: Intact Cognition Cognition Overall Cognitive Status: Within Functional Limits for tasks assessed Arousal/Alertness: Awake/alert Awareness: Impaired Safety/Judgment: Appears intact Sensation   Motor  Motor Motor: Hemiplegia Motor - Skilled Clinical Observations: L hemi  Trunk/Postural Assessment  Cervical Assessment Cervical Assessment: Within Functional Limits  Thoracic Assessment Thoracic Assessment: Exceptions to [facility] (Rounded shoulders) Lumbar Assessment Lumbar Assessment: Within Functional Limits Postural Control Postural Control: Deficits on evaluation Righting Reactions: Delayed  Balance Balance Balance Assessed: Yes Static Standing Balance Static Standing - Balance Support: No upper extremity supported Static Standing - Level of Assistance: 4: Min assist (/CGA) Dynamic Standing Balance Dynamic Standing - Balance Support: No upper extremity supported;During functional activity Dynamic Standing - Level of Assistance: 4: Min assist Extremity/Trunk Assessment RUE Assessment RUE Assessment: Within Functional Limits LUE Assessment LUE Assessment: Exceptions to [facility] LUE Body System: Neuro;Ortho Brunstrum levels for arm and hand: Arm;Hand Brunstrum level for arm: Stage IV Movement is deviating from synergy Brunstrum level for hand: Stage III Synergies performed voluntarily  Care Tool Care Tool Self Care Eating        Oral Care    Oral Care Assist Level: Minimal Assistance - Patient > 75%    Bathing   Body parts bathed by patient: Right arm;Left arm;Chest;Abdomen;Front perineal area;Buttocks;Right upper leg;Right lower leg;Left upper  leg;Left lower leg;Face     Assist Level: Minimal Assistance - Patient > 75%    Upper Body Dressing(including orthotics)   What is the patient wearing?: Pull over shirt   Assist Level: Moderate Assistance - Patient 50 - 74%    Lower Body Dressing (excluding footwear)   What is the patient wearing?: Pants Assist for lower body dressing: Moderate Assistance - Patient 50 - 74%    Putting on/Taking off footwear   What is the patient wearing?: Non-skid slipper socks Assist for footwear: Minimal Assistance - Patient > 75%       Care Tool Toileting Toileting activity   Assist for toileting: Minimal Assistance - Patient > 75%     Care Tool Bed Mobility Roll left and right activity   Roll left and right assist level: Contact Guard/Touching assist    Sit to lying activity   Sit to lying assist level: Contact Guard/Touching assist    Lying to sitting on side of bed activity   Lying to sitting on side of bed assist level: the ability to move from lying on the back to sitting on the side of the bed with no back support.: Contact Guard/Touching assist     Care Tool Transfers Sit to stand transfer   Sit to stand assist level: Minimal Assistance - Patient > 75%    Chair/bed transfer   Chair/bed transfer assist level: Minimal Assistance - Patient > 75%     Toilet transfer   Assist Level: Minimal Assistance - Patient > 75%     Care Tool Cognition  Expression of Ideas and Wants Expression of Ideas and Wants: 4. Without difficulty (complex and basic) - expresses complex messages without difficulty and with speech that is clear and easy to understand  Understanding Verbal and Non-Verbal Content Understanding Verbal and Non-Verbal Content: 4. Understands (complex and basic) - clear comprehension without cues or repetitions   Memory/Recall Ability Memory/Recall Ability : Current season;That he or she is in a hospital/hospital unit   Refer to Care Plan for Long Term Goals  SHORT TERM  GOAL WEEK 1 OT Short Term Goal 1 (Week 1): Pt will groom in standing with CGA OT Short Term Goal 2 (Week 1): Pt will don shirt wiht MIN A OT Short Term Goal 3 (Week 1): PT will complete TTB wiht CGA OT Short Term Goal 4 (Week 1): Pt will demo active digit extension to demo improved NMR  Recommendations for other services: Therapeutic  Recreation  Pet therapy, Kitchen group, Stress management, and Outing/community reintegration   Skilled Therapeutic Intervention ADL ADL Eating: Minimal assistance Where Assessed-Eating: Edge of bed Grooming: Minimal assistance Where Assessed-Grooming: Standing at sink Upper Body Bathing: Minimal assistance Where Assessed-Upper Body Bathing: Standing at sink Lower Body Bathing: Minimal assistance Where Assessed-Lower Body Bathing: Sitting at sink Upper Body Dressing: Moderate assistance Lower Body Dressing: Minimal assistance Where Assessed-Lower Body Dressing: Sitting at sink;Standing at sink Toileting: Minimal assistance Where Assessed-Toileting: Teacher, adult education: Minimal assistance Tub/Shower Transfer: Minimal Radiation protection practitioner Method: Ambulating Tub/Shower Equipment: Shower seat without back Mobility  Bed Mobility Bed Mobility: Rolling Right;Rolling Left;Supine to Sit;Sit to Supine Rolling Right: Contact Guard/Touching assist Rolling Left: Contact Guard/Touching assist Supine to Sit: Contact Guard/Touching assist Sit to Supine: Contact Guard/Touching assist Transfers Sit to Stand: Minimal Assistance - Patient > 75% Stand to Sit: Minimal Assistance - Patient > 75%  1:1. Pt educated on OT role/purpose, CIR, ELOS, and BI recovery. Pt completes BADL at ambulatory shower level with overall MIN A for seated and standing BADLs into and out of bathroom. Pt demo decent safety awareness waiting for OT prior to standing and needed min cuing for incorporating LUE into activities, WB in standing into sink for grooming and min HOH A to  wash RUE with L to maintain grasp on wash cloth. Pt has active digit flex, and intermittent active exten noticed at times. Pt would benefit from estim, but need to clear with MD for wrist extensors. Pt completes dynamometer 145# of grip in RUE and 35# in LUE. Unable ot complete box and blocks d/t decreased extension. Pt completes WB/NMR activities in tall kneeling, quadruped, seated and planargrade: modified push ups, lateral leans + resistance through L elbow, crossing midline reaching, cat/cow, and towel slides all to activate LUE and facilitate deep proprioceptive input into all joints. Pt provided with small scissors to open condiment packets since he is a "sauce guy" and help increase independence with meals. Instructed using LUE as stabilizer till grasp improves. Exited session with pt seated in recliner, exit alarm on and call light in reach     Discharge Criteria: Patient will be discharged from OT if patient refuses treatment 3 consecutive times without medical reason, if treatment goals not met, if there is a change in medical status, if patient makes no progress towards goals or if patient is discharged from hospital.  The above assessment, treatment plan, treatment alternatives and goals were discussed and mutually agreed upon: by patient  [name] 01/07/2023, 12:38 PM

## 2023-01-07 NOTE — Progress Notes (Signed)
Physical Therapy Assessment and Plan  Patient Details  Name: Shawn York MRN: [id number] Date of Birth: [birth year]  PT Diagnosis: Abnormality of gait, Cognitive deficits, Difficulty walking, Hemiplegia non-dominant, Impaired sensation, Muscle weakness, and Pain in Headcahes Rehab Potential: Good ELOS: 10-12   Today's Date: 01/07/2023 PT Individual Time: 1st Treatment Session: 4098-1191; 2nd Treatment Session: 4782-9562 PT Individual Time Calculation (min): 75 min; 40 min  Hospital Problem: Principal Problem:   Oligodendroglioma [facility])   Past Medical History:  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   Depression    Epistaxis    Headache    Hypertension    PTSD (post-traumatic stress disorder)    Seizures (HCC)    Past Surgical History:  Past Surgical History:  Procedure Laterality Date   BASAL CELL CARCINOMA EXCISION  1995   CRANIOTOMY FOR TUMOR Right 02/25/2011   IR ANGIO EXTERNAL CAROTID SEL EXT CAROTID BILAT MOD SED  11/16/2021   IR ANGIO INTRA EXTRACRAN SEL COM CAROTID INNOMINATE UNI R MOD SED  11/16/2021   IR ANGIO INTRA EXTRACRAN SEL INTERNAL CAROTID UNI L MOD SED  11/16/2021   IR ANGIO VERTEBRAL SEL VERTEBRAL UNI R MOD SED  11/16/2021   IR ANGIOGRAM FOLLOW UP STUDY  11/17/2021   IR NEURO EACH ADD'L AFTER BASIC UNI LEFT (MS)  11/17/2021   IR NEURO EACH ADD'L AFTER BASIC UNI RIGHT (MS)  11/17/2021   IR TRANSCATH/EMBOLIZ  11/16/2021   IR US GUIDE VASC ACCESS RIGHT  11/16/2021   RADIOLOGY WITH ANESTHESIA N/A 11/16/2021   Procedure: IR WITH ANESTHESIA;  Surgeon: Baldemar Lenis, MD;  Location: [facility] OR;  Service: Radiology;  Laterality: N/A;   TOTAL HIP ARTHROPLASTY Right 12/21/2021   Procedure: RIGHT TOTAL HIP REPLACEMENT;  Surgeon: Tarry Kos, MD;  Location: [facility] OR;  Service: Orthopedics;  Laterality: Right;  3-C   WISDOM TOOTH EXTRACTION       Assessment & Plan Clinical Impression: Patient is a 47 year old male with history of AVN of b-hips s/p R-THR, HTN, PTSD, depression w/anxiety, ascending aortic aneurysm, 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 Adline Peals with 70% removal of tumor. Metronomic Temodar initiated. He completed standard radiation with concurrent temozolomide and Avastin in 2013. MRI on 11/09/2022 suspicious for progression and repeat MRI done 4 weeks later found to have FLAIR lesion. He underwent repeat right temporal craniotomy for resection of tumor on 12/29/2022 by Dr. Lavonna Monarch at [facility]. This was complicated by bleeding from a branch of the MCA and post-op motor deficits of left side and left facial droop. Follow up MRI showed subjacent extra axial hemorrhage with layering blood products in right temporal lobe resection cavity with small volume SAH and trace cytotoxic edema in resection cavity. He was loaded with Vimpat and has been transitioned back to home Trileptal.    Hyponatremia with sodium down to 129 managed with 1 L fluid restriction, hypertonic saline, Gatorade, salt tabs and Ura NA with resolution Low phosphorus levels normalized from 2.2 to 3.7. ABLA improving with Hgb up to 12.4 and platelets stable at 243. He is tolerating regular diet with thin liquids but reporting chronic cough which is ongoing as well as persistent HA. On Lyrica for HA and doxycycline x 12 days added on 05/09 for HEENT/respiratory symptoms?. He continues on decadron 4 mg BID  with taper to start in 4 days. He continues on trileptal, Rexulti and buspar for management of anxiety/depression. Patient with resultant left sided weakness with sensory changes, left inattention, headaches as well as decreased insight into deficits.  The patient requires inpatient medicine and rehabilitation evaluations and services for ongoing dysfunction secondary to right temporal  craniotomy for recurrent anaplastic oligodendroglioma with decline in his overall functional status.   Patient currently requires min with mobility secondary to muscle weakness, decreased cardiorespiratoy endurance, L lateral lean with lateral lean, and decreased standing balance and decreased balance strategies.  Prior to hospitalization, patient was independent  with mobility and lived with Spouse, Daughter, Son in a House home.  Home access is Stoop with three steps and no railStairs to enter.  Patient will benefit from skilled PT intervention to maximize safe functional mobility, minimize fall risk, and decrease caregiver burden for planned discharge home with intermittent assist.  Anticipate patient will benefit from follow up OP at discharge.  PT - End of Session Activity Tolerance: Tolerates 30+ min activity with multiple rests Endurance Deficit: Yes Endurance Deficit Description: Impaired endurance/activity tolerance PT Assessment Rehab Potential (ACUTE/IP ONLY): Good PT Barriers to Discharge: Insurance for SNF coverage;Home environment access/layout;Wound Care PT Patient demonstrates impairments in the following area(s): Balance;Perception;Safety;Sensory;Endurance;Skin Integrity;Motor;Pain PT Transfers Functional Problem(s): Bed Mobility;Bed to Chair;Car;Furniture PT Locomotion Functional Problem(s): Ambulation;Stairs PT Plan PT Intensity: Minimum of 1-2 x/day ,45 to 90 minutes PT Frequency: 5 out of 7 days PT Duration Estimated Length of Stay: 10-12 PT Treatment/Interventions: Ambulation/gait training;Community reintegration;Neuromuscular re-education;DME/adaptive equipment instruction;Psychosocial support;Stair training;UE/LE Strength taining/ROM;Balance/vestibular training;Discharge planning;Functional electrical stimulation;Pain management;Skin care/wound management;Therapeutic Activities;UE/LE Coordination activities;Cognitive remediation/compensation;Disease  management/prevention;Functional mobility training;Patient/family education;Splinting/orthotics;Therapeutic Exercise;Visual/perceptual remediation/compensation PT Transfers Anticipated Outcome(s): ModI/Supv with LRAD PT Locomotion Anticipated Outcome(s): Supv with LRAD PT Recommendation Recommendations for Other Services: Neuropsych consult;Therapeutic Recreation consult Therapeutic Recreation Interventions: Stress management;Outing/community reintergration Follow Up Recommendations: Outpatient PT Patient destination: Home Equipment Recommended: To be determined Equipment Details: TBD   PT Evaluation Precautions/Restrictions Precautions Precautions: Fall Precaution Comments: L hemi, R crani Restrictions Weight Bearing Restrictions: No Pain No/Denies pain Pain Interference Pain Interference Pain Effect on Sleep: 1. Rarely or not at all Pain Interference with Therapy Activities: 1. Rarely or not at all Pain Interference with Day-to-Day Activities: 2. Occasionally Home Living/Prior Functioning Home Living Available Help at Discharge: Family;Available 24 hours/day Type of Home: House Home Access: Stairs to enter [employer] of Steps: Stoop with three steps and no rail Entrance Stairs-Rails: None Home Layout: One level Bathroom Shower/Tub: Engineer, manufacturing systems: Standard Bathroom Accessibility: Yes Additional Comments: Patient reports FIL could build HR for stairs  Lives With: Spouse;Daughter;Son Prior Function Level of Independence: Independent with basic ADLs;Independent with transfers;Independent with gait;Independent with homemaking with ambulation  Able to Take Stairs?: Yes Driving: Yes Vocation: Full time employment 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[employer], 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[employer], etc Leisure: Hobbies-yes (Comment) (Play guitar, spend time with his children, doing projects around the house) Vision/Perception  Vision  - History Ability to See in Adequate Light: 0 Adequate Vision - Assessment Additional Comments: Patient reports L eye travels at times- L eye slower to track compared to R, however able to follow in all quadrants Perception Perception: Within Functional Limits Praxis Praxis: Intact  Cognition Overall Cognitive Status: Within Functional Limits for tasks assessed Arousal/Alertness: Awake/alert Orientation Level: Oriented X4 Year: 2024 Month: May Day of Week: Correct Awareness: Impaired Safety/Judgment: Appears intact Sensation Sensation Light Touch: Impaired by gross assessment Proprioception: Appears  Intact Coordination Gross Motor Movements are Fluid and Coordinated: No Fine Motor Movements are Fluid and Coordinated: No Finger Nose Finger Test: Minimal undershooting with R hand Motor  Motor Motor: Hemiplegia Motor - Skilled Clinical Observations: L hemi   Trunk/Postural Assessment  Cervical Assessment Cervical Assessment: Within Functional Limits Thoracic Assessment Thoracic Assessment: Exceptions to [facility] (Rounded shoulders) Lumbar Assessment Lumbar Assessment: Within Functional Limits Postural Control Postural Control: Deficits on evaluation Righting Reactions: Delayed  Balance Balance Balance Assessed: Yes Static Standing Balance Static Standing - Balance Support: No upper extremity supported Static Standing - Level of Assistance: 4: Min assist (/CGA) Dynamic Standing Balance Dynamic Standing - Balance Support: No upper extremity supported;During functional activity Dynamic Standing - Level of Assistance: 4: Min assist Extremity Assessment      RLE Assessment RLE Assessment: Within Functional Limits General Strength Comments: Grossly 5/5 LLE Assessment LLE Assessment: Exceptions to [facility] General Strength Comments: Grossly 3+/5 for hip flexors, but 4+/5 for all other muscle groups  Care Tool Care Tool Bed Mobility Roll left and right activity   Roll left  and right assist level: Contact Guard/Touching assist    Sit to lying activity   Sit to lying assist level: Contact Guard/Touching assist    Lying to sitting on side of bed activity   Lying to sitting on side of bed assist level: the ability to move from lying on the back to sitting on the side of the bed with no back support.: Contact Guard/Touching assist     Care Tool Transfers Sit to stand transfer   Sit to stand assist level: Minimal Assistance - Patient > 75%    Chair/bed transfer   Chair/bed transfer assist level: Minimal Assistance - Patient > 75%     Psychologist, clinical transfer assist level: Minimal Assistance - Patient > 75%      Care Tool Locomotion Ambulation   Assist level: Minimal Assistance - Patient > 75% Assistive device: No Device Max distance: 200+  Walk 10 feet activity   Assist level: Minimal Assistance - Patient > 75% Assistive device: No Device   Walk 50 feet with 2 turns activity   Assist level: Minimal Assistance - Patient > 75% Assistive device: No Device  Walk 150 feet activity   Assist level: Minimal Assistance - Patient > 75% Assistive device: No Device  Walk 10 feet on uneven surfaces activity   Assist level: Minimal Assistance - Patient > 75% Assistive device: Other (comment) (No AD)  Stairs   Assist level: Minimal Assistance - Patient > 75% Stairs assistive device: 1 hand rail Max number of stairs: 12  Walk up/down 1 step activity   Walk up/down 1 step (curb) assist level: Minimal Assistance - Patient > 75% Walk up/down 1 step or curb assistive device: 1 hand rail  Walk up/down 4 steps activity   Walk up/down 4 steps assist level: Minimal Assistance - Patient > 75% Walk up/down 4 steps assistive device: 1 hand rail  Walk up/down 12 steps activity   Walk up/down 12 steps assist level: Minimal Assistance - Patient > 75% Walk up/down 12 steps assistive device: 1 hand rail  Pick up small objects from floor    Pick up small object from the floor assist level: Minimal Assistance - Patient > 75% Pick up small object from the floor assistive device: No AD  Wheelchair Is the patient using a wheelchair?: No  Wheel 50 feet with 2 turns activity      Wheel 150 feet activity        Refer to Care Plan for Long Term Goals  SHORT TERM GOAL WEEK 1 PT Short Term Goal 1 (Week 1): Patient will complete bed mobility with Supv PT Short Term Goal 2 (Week 1): Patient will ambulate >200' with CGA PT Short Term Goal 3 (Week 1): Patient will ascend/descend 12 steps with CGA  Recommendations for other services: Neuropsych and Therapeutic Recreation  Stress management and Outing/community reintegration  Skilled Therapeutic Intervention Mobility Bed Mobility Bed Mobility: Rolling Right;Rolling Left;Supine to Sit;Sit to Supine Rolling Right: Contact Guard/Touching assist Rolling Left: Contact Guard/Touching assist Supine to Sit: Contact Guard/Touching assist Sit to Supine: Contact Guard/Touching assist Transfers Transfers: Sit to Stand;Stand to Sit;Stand Pivot Transfers Sit to Stand: Minimal Assistance - Patient > 75% Stand to Sit: Minimal Assistance - Patient > 75% Stand Pivot Transfers: Minimal Assistance - Patient > 75% Stand Pivot Transfer Details: Verbal cues for sequencing;Manual facilitation for weight shifting Transfer (Assistive device): None Locomotion  Gait Ambulation: Yes Gait Assistance: Minimal Assistance - Patient > 75% Gait Distance (Feet): 200 Feet Assistive device: None Gait Assistance Details: Verbal cues for technique;Manual facilitation for weight shifting Gait Gait: Yes Gait Pattern: Impaired (L lateral lean, poor L foot clearance with minor buckling with fatigue) Stairs / Additional Locomotion Stairs: Yes Stairs Assistance: Minimal Assistance - Patient > 75% Stair Management Technique: One rail Left Number of Stairs: 12 Height of Stairs: 6 Ramp: Minimal  Assistance - Patient >75% Curb: Minimal Assistance - Patient >75% Wheelchair Mobility Wheelchair Mobility: No  Skilled Intervention:  1st Treatment Session- Patient greeted supine in bed with RN present administering morning medications and agreeable to PT treatment session. Evaluation completed (see details above and below) with education on PT POC and goals and individual treatment initiated with focus on bed mobility, transfers, gait, stair mobility, dynamic stability, global strengthening, improved midline posturing, etc. Patient completed all functional mobility without the use of an AD and CGA/MinA- Patient presents with L lateral lean with impaired awareness and decreased L foot clearance with gait secondary to lateral lean and weak hip flexors. Patient left sitting upright in bedside recliner with posey belt on (NT and secretary notified to grab a green posey alarm into his room), call bell within reach, breakfast tray in front of him and all needs met.    2nd Treatment Session- Patient greeted supine in bed with family present and agreeable to PT treatment session. Patient gait trained to the gym without the use of an AD and MinA with multimodal cues and facilitation for improved R lateral weight shift for improved L foot clearance during swing phase of gait. Patient completed the [facility] Balance Assessment in order to assess fall risk. Patient demonstrates increased fall risk as noted by score of 36/56 on Berg Balance Scale. (<36= high risk for falls, close to 100%; 37-45 significant >80%; 46-51 moderate >50%; 52-55 lower >25%). Patient required a seated rest break after each tasks in the assessment secondary to increased lethargy this afternoon. Patient became emotional during treatment session with therapist and spouse providing coaxing, education and active listening. Patient left supine in his room with bed alarm on, call bell within reach, family present and all needs met.     Discharge  Criteria: Patient will be discharged from PT if patient refuses treatment 3 consecutive times without medical reason, if treatment goals not met, if there is a change in  medical status, if patient makes no progress towards goals or if patient is discharged from hospital.  The above assessment, treatment plan, treatment alternatives and goals were discussed and mutually agreed upon: by patient  [place]  Rozlynn Lippold 01/07/2023, 8:52 AM

## 2023-01-07 NOTE — Progress Notes (Signed)
PROGRESS NOTE   Subjective/Complaints:  Pt doing well this morning, pain managed well. Slept alright. LBM yesterday. Urinating fine. Denies any other complaints or concerns. Coughing a bit, but not bad.   ROS: +cough Denies fevers, chills, CP, SOB, abd pain, N/V/D/C, or any other complaints at this time.    Objective:   DG Chest 2 View  Result Date: 01/06/2023 CLINICAL DATA:  Cough EXAM: CHEST - 2 VIEW COMPARISON:  02/14/2022 FINDINGS: Cardiac shadow is within normal limits. Lungs are well aerated bilaterally. No focal infiltrate or effusion is seen. Postsurgical changes in the cervical spine noted. IMPRESSION: No active cardiopulmonary disease. Electronically Signed   By: Alcide Clever M.D.   On: 01/06/2023 22:56   Recent Labs    01/07/23 0532  WBC 13.2*  HGB 11.0*  HCT 32.0*  PLT 220   Recent Labs    01/07/23 0532  NA 133*  K 4.3  CL 99  CO2 23  GLUCOSE 105*  BUN 24*  CREATININE 0.82  CALCIUM 8.7*    Intake/Output Summary (Last 24 hours) at 01/07/2023 1329 Last data filed at 01/07/2023 1015 Gross per 24 hour  Intake 837 ml  Output 2575 ml  Net -1738 ml        Physical Exam: Vital Signs Blood pressure (!) 145/94, pulse (!) 53, temperature 97.6 F (36.4 C), temperature source Oral, resp. rate 18, height 6\' 1"  (1.854 m), weight 102.3 kg, SpO2 100 %.  Constitutional: No apparent distress. Appropriate appearance for age. Somewhat flat mood/affect but very pleasant.  HENT: No JVD. Neck Supple. Trachea midline. + R craniotomy, Healed scar to anterior R neck from prior ACDF. Eyes: PERRLA. EOMI. Visual fields grossly intact.  Cardiovascular: RRR, no murmurs/rub/gallops. No Edema. Peripheral pulses 2+  Respiratory: CTAB. No rales, rhonchi, or wheezing. On RA. +dry cough-not noted today Abdomen: + bowel sounds, normoactive. No distention or tenderness.  Skin: C/D/I. No apparent lesions.  PRIOR EXAMS: MSK:       No apparent deformity.       Strength:                RUE: 5/5 SA, 5/5 EF, 5/5 EE, 5/5 WE, 5/5 FF, 5/5 FA                 LUE: 4/5 throughout                RLE: 5/5 HF, 5/5 KE, 5/5 DF, 5/5 EHL, 5/5 PF                 LLE:  5-/5 HF, 5-/5 KE, 5/5 DF, 5/5 EHL, 5/5 PF   Neurologic exam: +HA with extreme lateral gaze bilaterally Cognition: AAO to person, place, time and event.  + Significant motor delay in LUE > LLE Language: Fluent, No substitutions or neoglisms. No dysarthria. Names 3/3 objects correctly.  Memory: Recalls 3/3 objects at 5 minutes. No apparent deficits  Insight: Good  insight into current condition.  Mood: Pleasant affect, appropriate mood.  Sensation: To light touch intact in BL UEs and LEs  Reflexes: 2+ in BL UE and LEs. + L Hoffman's  CN: + L facial droop + L  V2 sensory loss Coordination: Fine motor difficulty LUE Spasticity: MAS 0 in all extremities.   Assessment/Plan: 1. Functional deficits which require 3+ hours per day of interdisciplinary therapy in a comprehensive inpatient rehab setting. Physiatrist is providing close team supervision and 24 hour management of active medical problems listed below. Physiatrist and rehab team continue to assess barriers to discharge/monitor patient progress toward functional and medical goals  Care Tool:  Bathing    Body parts bathed by patient: Right arm, Left arm, Chest, Abdomen, Front perineal area, Buttocks, Right upper leg, Right lower leg, Left upper leg, Left lower leg, Face         Bathing assist Assist Level: Minimal Assistance - Patient > 75%     Upper Body Dressing/Undressing Upper body dressing   What is the patient wearing?: Pull over shirt    Upper body assist Assist Level: Moderate Assistance - Patient 50 - 74%    Lower Body Dressing/Undressing Lower body dressing      What is the patient wearing?: Pants     Lower body assist Assist for lower body dressing: Moderate Assistance - Patient 50 -  74%     Toileting Toileting    Toileting assist Assist for toileting: Minimal Assistance - Patient > 75%     Transfers Chair/bed transfer  Transfers assist     Chair/bed transfer assist level: Minimal Assistance - Patient > 75%     Locomotion Ambulation   Ambulation assist      Assist level: Minimal Assistance - Patient > 75% Assistive device: No Device Max distance: 200+   Walk 10 feet activity   Assist     Assist level: Minimal Assistance - Patient > 75% Assistive device: No Device   Walk 50 feet activity   Assist    Assist level: Minimal Assistance - Patient > 75% Assistive device: No Device    Walk 150 feet activity   Assist    Assist level: Minimal Assistance - Patient > 75% Assistive device: No Device    Walk 10 feet on uneven surface  activity   Assist     Assist level: Minimal Assistance - Patient > 75% Assistive device: Other (comment) (No AD)   Wheelchair     Assist Is the patient using a wheelchair?: No             Wheelchair 50 feet with 2 turns activity    Assist            Wheelchair 150 feet activity     Assist          Blood pressure (!) 145/94, pulse (!) 53, temperature 97.6 F (36.4 C), temperature source Oral, resp. rate 18, height 6\' 1"  (1.854 m), weight 102.3 kg, SpO2 100 %.  Medical Problem List and Plan: 1. 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 Lavonna Monarch, c/b MCA bleeding with [facility]             -patient may shower             -ELOS/Goals: 10-14 days, supervision PT/OT   -01/07/23 CIR evals today 2.  Antithrombotics: -DVT/anticoagulation:  Pharmaceutical: Heparin 5000U q8h             -antiplatelet therapy: none   3. Pain Management: Tylenol and Robaxin as needed; oxycodone 10 mg q 4 hours prn             --Lyrica 100 mg TID  for HA   4. Mood/Behavior/Sleep: LCSW to evaluate and provide emotional  support. History of depressed mood, anxiety, PTSD             -antipsychotic agents: continue Rexulti 2 mg daily             -continue Buspar 30 mg BID  -Melatonin 3mg  QHS   5. Neuropsych/cognition: This patient is capable of making decisions on his own behalf.   6. Skin/Wound Care: Routine skin care checks             -monitor surgical incision (POD # 8 on 5/10)   7. Fluids/Electrolytes/Nutrition: Routine Is and Os. Check CMET in am and daily BMET for 5-7 days.  -01/07/23 Na 133 (see #9), BUN marginally elevated at 24 but Cr WNL, BMP otherwise fairly unremarkable; monitor labs -has SSI and CBGs ordered, no dx of DM2 seen in chart review, defer to primary but probably could d/c   8: Seizure prophylaxis: Continue Trileptal 300 mg BID             --has stable auras w/epigastric rising and mild dissociation.    9: Hyponatremia: To Continue Ure NA for 10 days (end 01/16/23)  --Continue 1 Liter fluid restriction with Gatorade prn -01/07/23 Na 133, monitor on labs   10: Post-op craniotomy: continue steroid taper: -Decadron 4 mg for 4 days, 2 mg BID for two days and then 2 mg once daily for one day --appt 05/15 10 am for Rad MRI [phone] and Dr. Burtis Junes office @ 3 pm at cancer center [phone].     11: Persistent Cough: On Doxycycline X 12 days per recommendations. Tessalon ineffective.  -01/07/23 CXR unremarkable, monitor cough, cont Robitussin PRN    12: Seasonal allergies: continue loratadine--advised to bring Xyzal from home as this works better-- not on formulary here. Continue Flonase--not reordered, defer to primary   13: BP goals: SBP between 120-140 to allow for perfusion. If above 140, monitor and do not give PRNs per NS  -01/07/23 BPs mostly at goal, monitor closely  Vitals:   01/06/23 1500 01/06/23 1933 01/07/23 0256  BP: (!) 149/92 128/87 (!) 145/94      14. Constipation: Has resolved with use of Sennakot 2 tabs BID and Miralax 17g BID, monitor for oversoftening    15. GI prophylaxis: continue Protonix 40mg  QD    LOS: 1 days A FACE TO FACE EVALUATION WAS PERFORMED  [address] 01/07/2023, 1:29 PM

## 2023-01-07 NOTE — Plan of Care (Signed)
Problem: Sit to Stand Goal: LTG:  Patient will perform sit to stand with assistance level (PT) Description: LTG:  Patient will perform sit to stand with assistance level (PT) Flowsheets (Taken 01/07/2023 1551) LTG: PT will perform sit to stand in preparation for functional mobility with assistance level: Independent with assistive device   Problem: RH Bed Mobility Goal: LTG Patient will perform bed mobility with assist (PT) Description: LTG: Patient will perform bed mobility with assistance, with/without cues (PT). Flowsheets (Taken 01/07/2023 1551) LTG: Pt will perform bed mobility with assistance level of: Independent with assistive device    Problem: RH Bed to Chair Transfers Goal: LTG Patient will perform bed/chair transfers w/assist (PT) Description: LTG: Patient will perform bed to chair transfers with assistance (PT). Flowsheets (Taken 01/07/2023 1551) LTG: Pt will perform Bed to Chair Transfers with assistance level: Independent with assistive device    Problem: RH Car Transfers Goal: LTG Patient will perform car transfers with assist (PT) Description: LTG: Patient will perform car transfers with assistance (PT). Flowsheets (Taken 01/07/2023 1551) LTG: Pt will perform car transfers with assist:: Supervision/Verbal cueing   Problem: RH Furniture Transfers Goal: LTG Patient will perform furniture transfers w/assist (OT/PT) Description: LTG: Patient will perform furniture transfers  with assistance (OT/PT). Flowsheets (Taken 01/07/2023 1551) LTG: Pt will perform furniture transfers with assist:: Independent with assistive device    Problem: RH Floor Transfers Goal: LTG Patient will perform floor transfers w/assist (PT) Description: LTG: Patient will perform floor transfers with assistance (PT). Flowsheets (Taken 01/07/2023 1551) LTG: PT WILL PERFORM FLOOR TRANFERS  WITH  ASSIST:: Supervision/Verbal cueing   Problem: RH Ambulation Goal: LTG Patient will ambulate in home environment  (PT) Description: LTG: Patient will ambulate in home environment, # of feet with assistance (PT). Flowsheets (Taken 01/07/2023 1551) LTG: Pt will ambulate in home environ  assist needed:: Independent with assistive device LTG: Ambulation distance in home environment: 50' Goal: LTG Patient will ambulate in community environment (PT) Description: LTG: Patient will ambulate in community environment, # of feet with assistance (PT). Flowsheets (Taken 01/07/2023 1551) LTG: Pt will ambulate in community environ  assist needed:: Supervision/Verbal cueing LTG: Ambulation distance in community environment: 150'   Problem: RH Stairs Goal: LTG Patient will ambulate up and down stairs w/assist (PT) Description: LTG: Patient will ambulate up and down # of stairs with assistance (PT) Flowsheets (Taken 01/07/2023 1551) LTG: Pt will ambulate up/down stairs assist needed:: Supervision/Verbal cueing LTG: Pt will  ambulate up and down number of stairs: 12

## 2023-01-08 DIAGNOSIS — E871 Hypo-osmolality and hyponatremia: Secondary | ICD-10-CM

## 2023-01-08 DIAGNOSIS — C719 Malignant neoplasm of brain, unspecified: Secondary | ICD-10-CM | POA: Diagnosis not present

## 2023-01-08 DIAGNOSIS — I10 Essential (primary) hypertension: Secondary | ICD-10-CM | POA: Diagnosis not present

## 2023-01-08 LAB — GLUCOSE, CAPILLARY
Glucose-Capillary: 109 mg/dL — ABNORMAL HIGH (ref 70–99)
Glucose-Capillary: 103 mg/dL — ABNORMAL HIGH (ref 70–99)
Glucose-Capillary: 133 mg/dL — ABNORMAL HIGH (ref 70–99)
Glucose-Capillary: 110 mg/dL — ABNORMAL HIGH (ref 70–99)

## 2023-01-08 LAB — BASIC METABOLIC PANEL
Anion gap: 9 (ref 5–15)
BUN: 26 mg/dL — ABNORMAL HIGH (ref 6–20)
CO2: 24 mmol/L (ref 22–32)
Calcium: 8.9 mg/dL (ref 8.9–10.3)
Chloride: 98 mmol/L (ref 98–111)
Creatinine, Ser: 0.96 mg/dL (ref 0.61–1.24)
GFR, Estimated: >60 mL/min (ref >60)
Glucose, Bld: 113 mg/dL — ABNORMAL HIGH (ref 70–99)
Potassium: 4.4 mmol/L (ref 3.5–5.1)
Sodium: 131 mmol/L — ABNORMAL LOW (ref 135–145)

## 2023-01-08 NOTE — Progress Notes (Signed)
PROGRESS NOTE   Subjective/Complaints:  Pt doing well again this morning, pain managed well. Slept ok. LBM yesterday. Urinating fine. Denies any other complaints or concerns.   ROS: +cough Denies fevers, chills, CP, SOB, abd pain, N/V/D/C, or any other complaints at this time.    Objective:   DG Chest 2 View  Result Date: 01/06/2023 CLINICAL DATA:  Cough EXAM: CHEST - 2 VIEW COMPARISON:  02/14/2022 FINDINGS: Cardiac shadow is within normal limits. Lungs are well aerated bilaterally. No focal infiltrate or effusion is seen. Postsurgical changes in the cervical spine noted. IMPRESSION: No active cardiopulmonary disease. Electronically Signed   By: Alcide Clever M.D.   On: 01/06/2023 22:56   Recent Labs    01/07/23 0532  WBC 13.2*  HGB 11.0*  HCT 32.0*  PLT 220   Recent Labs    01/07/23 0532 01/08/23 0539  NA 133* 131*  K 4.3 4.4  CL 99 98  CO2 23 24  GLUCOSE 105* 113*  BUN 24* 26*  CREATININE 0.82 0.96  CALCIUM 8.7* 8.9    Intake/Output Summary (Last 24 hours) at 01/08/2023 1140 Last data filed at 01/08/2023 1117 Gross per 24 hour  Intake 700 ml  Output 2150 ml  Net -1450 ml        Physical Exam: Vital Signs Blood pressure (!) 130/91, pulse (!) 59, temperature 98.4 F (36.9 C), resp. rate 19, height 6\' 1"  (1.854 m), weight 102.3 kg, SpO2 98 %.  Constitutional: No apparent distress. Appropriate appearance for age. Somewhat flat mood/affect but very pleasant. Sitting in bed HENT: No JVD. Neck Supple. Trachea midline. + R craniotomy, Healed scar to anterior R neck from prior ACDF. Eyes: PERRLA. EOMI. Visual fields grossly intact.  Cardiovascular: RRR, no murmurs/rub/gallops. No Edema. Peripheral pulses 2+  Respiratory: CTAB. No rales, rhonchi, or wheezing. On RA. +dry cough-not noted today Abdomen: + bowel sounds, normoactive. No distention or tenderness.  Skin: C/D/I. No apparent lesions aside from R  craniotomy wound  PRIOR EXAMS: MSK:      No apparent deformity.       Strength:                RUE: 5/5 SA, 5/5 EF, 5/5 EE, 5/5 WE, 5/5 FF, 5/5 FA                 LUE: 4/5 throughout                RLE: 5/5 HF, 5/5 KE, 5/5 DF, 5/5 EHL, 5/5 PF                 LLE:  5-/5 HF, 5-/5 KE, 5/5 DF, 5/5 EHL, 5/5 PF   Neurologic exam: +HA with extreme lateral gaze bilaterally Cognition: AAO to person, place, time and event.  + Significant motor delay in LUE > LLE Language: Fluent, No substitutions or neoglisms. No dysarthria. Names 3/3 objects correctly.  Memory: Recalls 3/3 objects at 5 minutes. No apparent deficits  Insight: Good  insight into current condition.  Mood: Pleasant affect, appropriate mood.  Sensation: To light touch intact in BL UEs and LEs  Reflexes: 2+ in BL UE and LEs. + L  Hoffman's  CN: + L facial droop + L V2 sensory loss Coordination: Fine motor difficulty LUE Spasticity: MAS 0 in all extremities.   Assessment/Plan: 1. Functional deficits which require 3+ hours per day of interdisciplinary therapy in a comprehensive inpatient rehab setting. Physiatrist is providing close team supervision and 24 hour management of active medical problems listed below. Physiatrist and rehab team continue to assess barriers to discharge/monitor patient progress toward functional and medical goals  Care Tool:  Bathing    Body parts bathed by patient: Right arm, Left arm, Chest, Abdomen, Front perineal area, Buttocks, Right upper leg, Right lower leg, Left upper leg, Left lower leg, Face         Bathing assist Assist Level: Minimal Assistance - Patient > 75%     Upper Body Dressing/Undressing Upper body dressing   What is the patient wearing?: Pull over shirt    Upper body assist Assist Level: Moderate Assistance - Patient 50 - 74%    Lower Body Dressing/Undressing Lower body dressing      What is the patient wearing?: Pants     Lower body assist Assist for lower body  dressing: Moderate Assistance - Patient 50 - 74%     Toileting Toileting    Toileting assist Assist for toileting: Minimal Assistance - Patient > 75%     Transfers Chair/bed transfer  Transfers assist     Chair/bed transfer assist level: Minimal Assistance - Patient > 75%     Locomotion Ambulation   Ambulation assist      Assist level: Minimal Assistance - Patient > 75% Assistive device: No Device Max distance: 200+   Walk 10 feet activity   Assist     Assist level: Minimal Assistance - Patient > 75% Assistive device: No Device   Walk 50 feet activity   Assist    Assist level: Minimal Assistance - Patient > 75% Assistive device: No Device    Walk 150 feet activity   Assist    Assist level: Minimal Assistance - Patient > 75% Assistive device: No Device    Walk 10 feet on uneven surface  activity   Assist     Assist level: Minimal Assistance - Patient > 75% Assistive device: Other (comment) (No AD)   Wheelchair     Assist Is the patient using a wheelchair?: No             Wheelchair 50 feet with 2 turns activity    Assist            Wheelchair 150 feet activity     Assist          Blood pressure (!) 130/91, pulse (!) 59, temperature 98.4 F (36.9 C), resp. rate 19, height 6\' 1"  (1.854 m), weight 102.3 kg, SpO2 98 %.  Medical Problem List and Plan: 1. 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 Lavonna Monarch, c/b [facility] bleeding with [facility]             -patient may shower             -ELOS/Goals: 10-14 days, supervision PT/OT   -Continue CIR 2.  Antithrombotics: -DVT/anticoagulation:  Pharmaceutical: Heparin 5000U q8h             -antiplatelet therapy: none   3. Pain Management: Tylenol and Robaxin as needed; oxycodone 10 mg q 4 hours prn             --  Lyrica 100 mg TID for HA   4. Mood/Behavior/Sleep: LCSW to evaluate and provide  emotional support. History of depressed mood, anxiety, PTSD             -antipsychotic agents: continue Rexulti 2 mg daily             -continue Buspar 30 mg BID  -Melatonin 3mg  QHS   5. Neuropsych/cognition: This patient is capable of making decisions on his own behalf.   6. Skin/Wound Care: Routine skin care checks             -monitor surgical incision (POD #8 on 5/10)   7. Fluids/Electrolytes/Nutrition: Routine Is and Os. Check CMET in am and daily BMET for 5-7 days.  -01/07/23 Na 133 (see #9), BUN marginally elevated at 24 but Cr WNL, BMP otherwise fairly unremarkable; monitor labs -has SSI and CBGs ordered, no dx of DM2 seen in chart review, defer to primary but probably could d/c   -01/08/23 BMP stable from yesterday, see below for Na/AKI discussion  8: Seizure prophylaxis: Continue Trileptal 300 mg BID             --has stable auras w/epigastric rising and mild dissociation.    9: Hyponatremia: To Continue Ure NA for 10 days (end 01/16/23)  --Continue 1 Liter fluid restriction with Gatorade prn -01/07/23 Na 133, monitor on labs -01/08/23 Na slightly lower at 131, cont fluid restriction and regimen for now, monitor on daily labs   10: Post-op craniotomy: continue steroid taper: -Decadron 4 mg for 4 days, 2 mg BID for two days and then 2 mg once daily for one day --appt 05/15 10 am for Rad MRI [phone] and Dr. Burtis Junes office @ 3 pm at cancer center [phone].     11: Persistent Cough: On Doxycycline X 12 days per recommendations. Tessalon ineffective.  -01/07/23 CXR unremarkable, monitor cough, cont Robitussin PRN    12: Seasonal allergies: continue loratadine--advised to bring Xyzal from home as this works better-- not on formulary here. Continue Flonase--not reordered, defer to primary since he's also on decadron   13: BP goals: SBP between 120-140 to allow for perfusion. If above 140, monitor and do not give PRNs per NS  -01/07/23 BPs mostly at goal, monitor  closely  -01/08/23 BPs at goal since last night, monitor  Vitals:   01/06/23 1500 01/06/23 1933 01/07/23 0256 01/07/23 1608  BP: (!) 149/92 128/87 (!) 145/94 133/81   01/07/23 2034 01/08/23 0518  BP: 139/89 (!) 130/91      14. Constipation: Has resolved with use of Sennakot 2 tabs BID and Miralax 17g BID, monitor for oversoftening   15. GI prophylaxis: continue Protonix 40mg  QD    LOS: 2 days A FACE TO FACE EVALUATION WAS PERFORMED  [address] Orel Hord 01/08/2023, 11:40 AM

## 2023-01-09 ENCOUNTER — Inpatient Hospital Stay (HOSPITAL_COMMUNITY): Payer: BC Managed Care – PPO

## 2023-01-09 ENCOUNTER — Telehealth: Payer: Self-pay | Admitting: Psychiatry

## 2023-01-09 DIAGNOSIS — R059 Cough, unspecified: Secondary | ICD-10-CM | POA: Diagnosis not present

## 2023-01-09 DIAGNOSIS — C719 Malignant neoplasm of brain, unspecified: Secondary | ICD-10-CM | POA: Diagnosis not present

## 2023-01-09 DIAGNOSIS — R531 Weakness: Secondary | ICD-10-CM | POA: Diagnosis not present

## 2023-01-09 LAB — URINALYSIS, ROUTINE W REFLEX MICROSCOPIC
Bilirubin Urine: NEGATIVE
Glucose, UA: NEGATIVE mg/dL
Hgb urine dipstick: NEGATIVE
Ketones, ur: NEGATIVE mg/dL
Leukocytes,Ua: NEGATIVE
Nitrite: NEGATIVE
Protein, ur: NEGATIVE mg/dL
Specific Gravity, Urine: 1.015 (ref 1.005–1.030)
pH: 6 (ref 5.0–8.0)

## 2023-01-09 LAB — CBC WITH DIFFERENTIAL/PLATELET
Abs Immature Granulocytes: 0 10*3/uL (ref 0.00–0.07)
Basophils Absolute: 0 10*3/uL (ref 0.0–0.1)
Basophils Relative: 0 %
Eosinophils Absolute: 0 10*3/uL (ref 0.0–0.5)
Eosinophils Relative: 0 %
HCT: 33.2 % — ABNORMAL LOW (ref 39.0–52.0)
Hemoglobin: 11.5 g/dL — ABNORMAL LOW (ref 13.0–17.0)
Lymphocytes Relative: 16 %
Lymphs Abs: 2 10*3/uL (ref 0.7–4.0)
MCH: 33.3 pg (ref 26.0–34.0)
MCHC: 34.6 g/dL (ref 30.0–36.0)
MCV: 96.2 fL (ref 80.0–100.0)
Monocytes Absolute: 0.2 10*3/uL (ref 0.1–1.0)
Monocytes Relative: 2 %
Neutro Abs: 10 10*3/uL — ABNORMAL HIGH (ref 1.7–7.7)
Neutrophils Relative %: 82 %
Platelets: 206 10*3/uL (ref 150–400)
RBC: 3.45 MIL/uL — ABNORMAL LOW (ref 4.22–5.81)
RDW: 13.6 % (ref 11.5–15.5)
WBC: 12.2 10*3/uL — ABNORMAL HIGH (ref 4.0–10.5)
nRBC: 0.2 % (ref 0.0–0.2)
nRBC: 1 /100 WBC — ABNORMAL HIGH

## 2023-01-09 LAB — BASIC METABOLIC PANEL
Anion gap: 11 (ref 5–15)
BUN: 28 mg/dL — ABNORMAL HIGH (ref 6–20)
CO2: 21 mmol/L — ABNORMAL LOW (ref 22–32)
Calcium: 8.5 mg/dL — ABNORMAL LOW (ref 8.9–10.3)
Chloride: 95 mmol/L — ABNORMAL LOW (ref 98–111)
Creatinine, Ser: 0.92 mg/dL (ref 0.61–1.24)
GFR, Estimated: >60 mL/min (ref >60)
Glucose, Bld: 119 mg/dL — ABNORMAL HIGH (ref 70–99)
Potassium: 4.2 mmol/L (ref 3.5–5.1)
Sodium: 127 mmol/L — ABNORMAL LOW (ref 135–145)

## 2023-01-09 LAB — OSMOLALITY: Osmolality: 290 mOsm/kg (ref 275–295)

## 2023-01-09 LAB — OSMOLALITY, URINE: Osmolality, Ur: 615 mOsm/kg (ref 300–900)

## 2023-01-09 LAB — GLUCOSE, CAPILLARY
Glucose-Capillary: 99 mg/dL (ref 70–99)
Glucose-Capillary: 110 mg/dL — ABNORMAL HIGH (ref 70–99)
Glucose-Capillary: 131 mg/dL — ABNORMAL HIGH (ref 70–99)
Glucose-Capillary: 111 mg/dL — ABNORMAL HIGH (ref 70–99)

## 2023-01-09 LAB — SODIUM, URINE, RANDOM: Sodium, Ur: 68 mmol/L

## 2023-01-09 LAB — ALBUMIN: Albumin: 3.3 g/dL — ABNORMAL LOW (ref 3.5–5.0)

## 2023-01-09 LAB — PHOSPHORUS: Phosphorus: 3.6 mg/dL (ref 2.5–4.6)

## 2023-01-09 MED ORDER — UREA 15 G PO PACK
30.0000 g | PACK | Freq: Two times a day (BID) | ORAL | Status: DC
  Administered 2023-01-09 – 2023-01-12 (×6): 30 g via ORAL
  Filled 2023-01-09 (×7): qty 2

## 2023-01-09 NOTE — Care Management (Signed)
Inpatient Rehabilitation Center Individual Statement of Services  Patient Name:  Shawn York  Date:  01/09/2023  Welcome to the Inpatient Rehabilitation Center.  Our goal is to provide you with an individualized program based on your diagnosis and situation, designed to meet your specific needs.  With this comprehensive rehabilitation program, you will be expected to participate in at least 3 hours of rehabilitation therapies Monday-Friday, with modified therapy programming on the weekends.  Your rehabilitation program will include the following services:  Physical Therapy (PT), Occupational Therapy (OT), 24 hour per day rehabilitation nursing, Therapeutic Recreaction (TR), Psychology, Neuropsychology, Care Coordinator, Rehabilitation Medicine, Nutrition Services, Pharmacy Services, and Other  Weekly team conferences will be held on Tuesdays to discuss your progress.  Your Inpatient Rehabilitation Care Coordinator will talk with you frequently to get your input and to update you on team discussions.  Team conferences with you and your family in attendance may also be held.  Expected length of stay: 10-12 days    Overall anticipated outcome: Supervision to Independent with Assistive Device  Depending on your progress and recovery, your program may change. Your Inpatient Rehabilitation Care Coordinator will coordinate services and will keep you informed of any changes. Your Inpatient Rehabilitation Care Coordinator's name and contact numbers are listed  below.  The following services may also be recommended but are not provided by the Inpatient Rehabilitation Center:  Driving Evaluations Home Health Rehabiltiation Services Outpatient Rehabilitation Services Vocational Rehabilitation   Arrangements will be made to provide these services after discharge if needed.  Arrangements include referral to agencies that provide these services.  Your insurance has been verified to be:  [employer]  Your  primary doctor is:  Sanda Linger  Pertinent information will be shared with your doctor and your insurance company.  Inpatient Rehabilitation Care Coordinator:  Susie Cassette [phone] or [phone]  Information discussed with and copy given to patient by: Gretchen Short, 01/09/2023, 9:13 AM

## 2023-01-09 NOTE — Progress Notes (Addendum)
PROGRESS NOTE   Subjective/Complaints:  Patient seen at bedside.  Continuing to complain of cough, which is still causing headaches.  States it feels like something is stuck in his chest that he cannot quite get out.  A.m. labs notable for NA 127.  ROS: +cough Denies fevers, chills, CP, SOB, abd pain, N/V/D/C, or any other complaints at this time.    Objective:   No results found. Recent Labs    01/07/23 0532  WBC 13.2*  HGB 11.0*  HCT 32.0*  PLT 220    Recent Labs    01/07/23 0532 01/08/23 0539  NA 133* 131*  K 4.3 4.4  CL 99 98  CO2 23 24  GLUCOSE 105* 113*  BUN 24* 26*  CREATININE 0.82 0.96  CALCIUM 8.7* 8.9     Intake/Output Summary (Last 24 hours) at 01/09/2023 0836 Last data filed at 01/09/2023 1610 Gross per 24 hour  Intake 480 ml  Output 2125 ml  Net -1645 ml         Physical Exam: Vital Signs Blood pressure 130/86, pulse 62, temperature 97.9 F (36.6 C), temperature source Oral, resp. rate 18, height 6\' 1"  (1.854 m), weight 102.3 kg, SpO2 97 %.  Constitutional: No apparent distress. Appropriate appearance for age. Somewhat flat mood/affect but very pleasant.  Sitting up in bed. HENT: No JVD. Neck Supple. Trachea midline. + R craniotomy, Healed scar to anterior R neck from prior ACDF. Eyes: PERRLA. EOMI. Visual fields grossly intact.  Cardiovascular: RRR, no murmurs/rub/gallops. No Edema. Peripheral pulses 2+  Respiratory: CTAB. No rales, rhonchi, or wheezing. On RA. +dry cough-continues, unchanged. Abdomen: + bowel sounds, normoactive. No distention or tenderness.  Skin: C/D/I. No apparent lesions aside from R craniotomy wound MSK:      No apparent deformity.       Strength:                RUE: 5/5 SA, 5/5 EF, 5/5 EE, 5/5 WE, 5/5 FF, 5/5 FA                 LUE: 4/5 throughout                RLE: 5/5 HF, 5/5 KE, 5/5 DF, 5/5 EHL, 5/5 PF                 LLE:  5-/5 HF, 5-/5 KE, 5/5 DF, 5/5  EHL, 5/5 PF   Neurologic exam: +HA with extreme lateral gaze bilaterally -not noted today Cognition: AAO to person, place, time and event.  + Significant motor delay in LUE > LLE Language: Fluent, No substitutions or neoglisms. No dysarthria. Names 3/3 objects correctly.  Memory: Recalls 3/3 objects at 5 minutes. No apparent deficits  Insight: Good  insight into current condition.  Mood: Pleasant affect, appropriate mood.  Sensation: To light touch intact in BL UEs and LEs  Reflexes: 2+ in BL UE and LEs. + L Hoffman's  CN: + L facial droop + L V2 sensory loss Coordination: Fine motor difficulty LUE Spasticity: MAS 0 in all extremities.   Assessment/Plan: 1. Functional deficits which require 3+ hours per day of interdisciplinary therapy in a comprehensive inpatient  rehab setting. Physiatrist is providing close team supervision and 24 hour management of active medical problems listed below. Physiatrist and rehab team continue to assess barriers to discharge/monitor patient progress toward functional and medical goals  Care Tool:  Bathing    Body parts bathed by patient: Right arm, Left arm, Chest, Abdomen, Front perineal area, Buttocks, Right upper leg, Right lower leg, Left upper leg, Left lower leg, Face         Bathing assist Assist Level: Minimal Assistance - Patient > 75%     Upper Body Dressing/Undressing Upper body dressing   What is the patient wearing?: Pull over shirt    Upper body assist Assist Level: Moderate Assistance - Patient 50 - 74%    Lower Body Dressing/Undressing Lower body dressing      What is the patient wearing?: Pants     Lower body assist Assist for lower body dressing: Moderate Assistance - Patient 50 - 74%     Toileting Toileting    Toileting assist Assist for toileting: Minimal Assistance - Patient > 75%     Transfers Chair/bed transfer  Transfers assist     Chair/bed transfer assist level: Minimal Assistance - Patient > 75%      Locomotion Ambulation   Ambulation assist      Assist level: Minimal Assistance - Patient > 75% Assistive device: No Device Max distance: 200+   Walk 10 feet activity   Assist     Assist level: Minimal Assistance - Patient > 75% Assistive device: No Device   Walk 50 feet activity   Assist    Assist level: Minimal Assistance - Patient > 75% Assistive device: No Device    Walk 150 feet activity   Assist    Assist level: Minimal Assistance - Patient > 75% Assistive device: No Device    Walk 10 feet on uneven surface  activity   Assist     Assist level: Minimal Assistance - Patient > 75% Assistive device: Other (comment) (No AD)   Wheelchair     Assist Is the patient using a wheelchair?: No             Wheelchair 50 feet with 2 turns activity    Assist            Wheelchair 150 feet activity     Assist          Blood pressure 130/86, pulse 62, temperature 97.9 F (36.6 C), temperature source Oral, resp. rate 18, height 6\' 1"  (1.854 m), weight 102.3 kg, SpO2 97 %.  Medical Problem List and Plan: 1. 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 Lavonna Monarch, c/b MCA bleeding with [facility]             -patient may shower             -ELOS/Goals: 10-14 days, supervision PT/OT   -Continue CIR 2.  Antithrombotics: -DVT/anticoagulation:  Pharmaceutical: Heparin 5000U q8h             -antiplatelet therapy: none   3. Pain Management: Tylenol and Robaxin as needed; oxycodone 10 mg q 4 hours prn             --Lyrica 100 mg TID for HA   4. Mood/Behavior/Sleep: LCSW to evaluate and provide emotional support. History of depressed mood, anxiety, PTSD             -antipsychotic agents: continue Rexulti 2 mg  daily             -continue Buspar 30 mg BID  -Melatonin 3mg  QHS   5. Neuropsych/cognition: This patient is capable of making decisions on his own  behalf.   6. Skin/Wound Care: Routine skin care checks             -monitor surgical incision (POD #8 on 5/10)   7. Fluids/Electrolytes/Nutrition: Routine Is and Os. Check CMET in am and daily BMET for 5-7 days.  -01/07/23 Na 133 (see #9), BUN marginally elevated at 24 but Cr WNL, BMP otherwise fairly unremarkable; monitor labs   -01/08/23 BMP stable from yesterday, see below for Na/AKI discussion Recent Labs    01/09/23 0622 01/09/23 1202 01/09/23 1629  GLUCAP 99 110* 131*    -5/13: Blood sugars well-controlled despite steroid taper, discontinue CBGs  8: Seizure prophylaxis: Continue Trileptal 300 mg BID             --has stable auras w/epigastric rising and mild dissociation.    9: Hyponatremia: To Continue Ure NA for 10 days (end 01/16/23)  --Continue 1 Liter fluid restriction with Gatorade prn -01/07/23 Na 133, monitor on labs -01/08/23 Na slightly lower at 131, cont fluid restriction and regimen for now, monitor on daily labs   5/13: NA continues to downtrend, nephrology consulted, appreciate recommendations; increase Urease and stop Gatorade accommodations for fluid restriction per their recommendations.  Daily BMPs for follow-up.  10: Post-op craniotomy: continue steroid taper: -Decadron 4 mg for 4 days, 2 mg BID for two days and then 2 mg once daily for one day --appt 05/15 10 am for Rad MRI [phone] and Dr. Burtis Junes office @ 3 pm at cancer center [phone]. -Discussing with social work regarding transportation versus rescheduling     11: Persistent Cough: On Doxycycline X 12 days per recommendations. Tessalon ineffective.  -01/07/23 CXR unremarkable, monitor cough, cont Robitussin PRN  5/13: Persistent; recheck chest x-ray, add flutter valve for secretions   12: Seasonal allergies: continue loratadine--advised to bring Xyzal from home as this works better-- not on formulary here. Continue Flonase--not reordered, defer to primary since he's also on decadron   13: BP  goals: SBP between 120-140 to allow for perfusion. If above 140, monitor and do not give PRNs per NS  -01/07/23 BPs mostly at goal, monitor closely  -01/08/23 BPs at goal since last night, monitor  5/13: Diastolic HTN; decreased steroids today, monitor  Vitals:   01/06/23 1500 01/06/23 1933 01/07/23 0256 01/07/23 1608  BP: (!) 149/92 128/87 (!) 145/94 133/81   01/07/23 2034 01/08/23 0518 01/08/23 1338 01/08/23 1933  BP: 139/89 (!) 130/91 (!) 142/100 (!) 147/96   01/09/23 0435  BP: 130/86      14. Constipation: Has resolved with use of Sennakot 2 tabs BID and Miralax 17g BID, monitor for oversoftening  - 5.13: LBM 5/12   15. GI prophylaxis: continue Protonix 40mg  QD    LOS: 3 days A FACE TO FACE EVALUATION WAS PERFORMED  Angelina Sheriff 01/09/2023, 8:36 AM

## 2023-01-09 NOTE — Discharge Instructions (Addendum)
Inpatient Rehab Discharge Instructions  Shawn York Discharge date and time:  01/18/2023  Activities/Precautions/ Functional Status: Activity: no lifting, driving, or strenuous exercise until cleared by MD Diet: regular diet Wound Care: keep wound clean and dry  Functional status:  ___ No restrictions     ___ Walk up steps independently ___ 24/7 supervision/assistance   ___ Walk up steps with assistance __x_ Intermittent supervision/assistance  ___ Bathe/dress independently ___ Walk with walker     ___ Bathe/dress with assistance ___ Walk Independently    ___ Shower independently ___ Walk with assistance    ___ Shower with assistance _X__ No alcohol     ___ Return to work/school ________  Special Instructions: No driving, alcohol consumption or tobacco use.  COMMUNITY REFERRALS UPON DISCHARGE:    Outpatient: PT     OT    ST             Agency: [facility] location  Phone:  [phone]              Appointment Date/Time: *Please expect follow-up within 7-10 business days to schedule your appointment. If you have not received follow-up, be sure to contact the site directly.*   My questions have been answered and I understand these instructions. I will adhere to these goals and the provided educational materials after my discharge from the hospital.  Patient/Caregiver Signature _______________________________ Date __________  Clinician Signature _______________________________________ Date __________  Please bring this form and your medication list with you to all your follow-up doctor's appointments.

## 2023-01-09 NOTE — Telephone Encounter (Signed)
Agreed. Will be glad to follow-up after discharge.

## 2023-01-09 NOTE — IPOC Note (Signed)
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)  Hospital Problems: Principal Problem:   Oligodendroglioma [facility])     Functional Problem List: Nursing Bowel, Safety, Sensory, Edema, Endurance, Medication Management, Pain  PT Balance, Perception, Safety, Sensory, Endurance, Skin Integrity, Motor, Pain  OT Balance, Cognition, Endurance, Motor, Safety, Vision  SLP    TR         Basic ADL's: OT Grooming, Bathing, Dressing, Toileting     Advanced  ADL's: OT Simple Meal Preparation     Transfers: PT Bed Mobility, Bed to Chair, Car, Occupational psychologist, Research scientist (life sciences): PT Ambulation, Stairs     Additional Impairments: OT Fuctional Use of Upper Extremity  SLP        TR      Anticipated Outcomes Item Anticipated Outcome  Self Feeding S  Swallowing      Basic self-care  S  Toileting  S   Bathroom Transfers S  Bowel/Bladder  continent B/B  Transfers  ModI/Supv with LRAD  Locomotion  Supv with LRAD  Communication     Cognition     Pain  less than 4  Safety/Judgment  no falls   Therapy Plan: PT Intensity: Minimum of 1-2 x/day ,45 to 90 minutes PT Frequency: 5 out of 7 days PT Duration Estimated Length of Stay: 10-12 OT Intensity: Minimum of 1-2 x/day, 45 to 90 minutes OT Frequency: 5 out of 7 days OT Duration/Estimated Length of Stay: 10-12     Team Interventions: Nursing Interventions Patient/Family Education, Pain Management, Medication Management, Discharge Planning, Bowel Management, Psychosocial Support, Disease Management/Prevention  PT interventions Ambulation/gait training, Community reintegration, Neuromuscular re-education, DME/adaptive equipment instruction, Psychosocial support, Stair training, UE/LE Strength taining/ROM, Warden/ranger, Discharge planning, Functional electrical stimulation, Pain management, Skin care/wound  management, Therapeutic Activities, UE/LE Coordination activities, Cognitive remediation/compensation, Disease management/prevention, Functional mobility training, Patient/family education, Splinting/orthotics, Therapeutic Exercise, Visual/perceptual remediation/compensation  OT Interventions Balance/vestibular training, Discharge planning, Functional electrical stimulation, Pain management, Self Care/advanced ADL retraining, Therapeutic Activities, UE/LE Coordination activities, Therapeutic Exercise, Skin care/wound managment, Patient/family education, Functional mobility training, Disease mangement/prevention, Cognitive remediation/compensation, Community reintegration, Fish farm manager, Neuromuscular re-education, Visual/perceptual remediation/compensation, UE/LE Strength taining/ROM, Wheelchair propulsion/positioning, Splinting/orthotics, Psychosocial support  SLP Interventions    TR Interventions    SW/CM Interventions Discharge Planning, Psychosocial Support, Patient/Family Education   Barriers to Discharge MD  Medical stability, Home enviroment access/loayout, Lack of/limited family support, Insurance for SNF coverage, and Behavior  Nursing Decreased caregiver support, Home environment access/layout, Wound Care 1 level with  ste no rails  PT Insurance for SNF coverage, Home environment access/layout, Wound Care    OT Inaccessible home environment    SLP      SW Insurance for SNF coverage, Pending chemo/radiation     Team Discharge Planning: Destination: PT-Home ,OT- Home , SLP-  Projected Follow-up: PT-Outpatient PT, OT-  Home health OT, SLP-  Projected Equipment Needs: PT-To be determined, OT- 3 in 1 bedside comode, Tub/shower bench, SLP-  Equipment Details: PT-TBD, OT-  Patient/family involved in discharge planning: PT- Patient,  OT-Patient, SLP-   MD ELOS: 10-14 days Medical Rehab Prognosis:  Excellent Assessment: The patient has been admitted for CIR therapies  with the diagnosis of  oligodendroma s/p right temporal craniotomy and  resection . The team will be addressing functional mobility, strength, stamina, balance, safety, adaptive techniques and equipment, self-care, bowel and bladder mgt, patient and caregiver education,. Goals have been  set at supervision. Anticipated discharge destination is home.       See Team Conference Notes for weekly updates to the plan of care

## 2023-01-09 NOTE — Telephone Encounter (Signed)
LVM to RC 

## 2023-01-09 NOTE — Progress Notes (Signed)
Inpatient Rehabilitation  Patient information reviewed and entered into eRehab system by Othniel Maret Stasia Somero, OTR/L, Rehab Quality Coordinator.   Information including medical coding, functional ability and quality indicators will be reviewed and updated through discharge.   

## 2023-01-09 NOTE — Consult Note (Signed)
[facility]  HISTORY AND PHYSICAL  Shawn York is an 47 y.o. male.    Chief Complaint: admission to CIR   HPI: Pt is a 67M with a PMH sig for anaplastic oligodendroglioma, avascular necrosis of the hips, and seizures who is now seen in consultation at the request of Dr. Shearon Stalls for eval and recs re: hyponatremia.    Pt underwent resection of oligodendroglioma with Dr Zachery Conch at [facility] 12/29/22.  This was complicated by MCA bleeding with SAH.  Initially Na normal postop but drifted down.  Thought to be r/t SIADH from Trileptal according to notes.  Was given 3% in neuro ICU which corrected issue quickly. Placed on 1L fluid restriction but with unlimited gatorade.  Also placed on UreNa.    Discharge Na was 135 on 01/05/23.  Was 133--> 131--> 127 01/09/23, prompting evaluation.    Pt is lying in bed, no issues reported.    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   Depression    Epistaxis    Headache    Hypertension    PTSD (post-traumatic stress disorder)    Seizures (HCC)    PSH: Past Surgical History:  Procedure Laterality Date   BASAL CELL CARCINOMA EXCISION  1995   CRANIOTOMY FOR TUMOR Right 02/25/2011   IR ANGIO EXTERNAL CAROTID SEL EXT CAROTID BILAT MOD SED  11/16/2021   IR ANGIO INTRA EXTRACRAN SEL COM CAROTID INNOMINATE UNI R MOD SED  11/16/2021   IR ANGIO INTRA EXTRACRAN SEL INTERNAL CAROTID UNI L MOD SED  11/16/2021   IR ANGIO VERTEBRAL SEL VERTEBRAL UNI R MOD SED  11/16/2021   IR ANGIOGRAM FOLLOW UP STUDY  11/17/2021   IR NEURO EACH ADD'L AFTER BASIC UNI LEFT (MS)  11/17/2021   IR NEURO EACH ADD'L AFTER BASIC UNI RIGHT (MS)  11/17/2021   IR TRANSCATH/EMBOLIZ  11/16/2021   IR US GUIDE VASC ACCESS RIGHT  11/16/2021   RADIOLOGY WITH ANESTHESIA N/A 11/16/2021   Procedure: IR WITH ANESTHESIA;  Surgeon: Baldemar Lenis, MD;  Location: [facility]  OR;  Service: Radiology;  Laterality: N/A;   TOTAL HIP ARTHROPLASTY Right 12/21/2021   Procedure: RIGHT TOTAL HIP REPLACEMENT;  Surgeon: Tarry Kos, MD;  Location: [facility] OR;  Service: Orthopedics;  Laterality: Right;  3-C   WISDOM TOOTH EXTRACTION       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   Depression    Epistaxis    Headache    Hypertension    PTSD (post-traumatic stress disorder)    Seizures (HCC)     Medications:  Scheduled:  bacitracin   Topical Daily   brexpiprazole  2 mg Oral Daily   busPIRone  30 mg Oral BID WC   dexamethasone  2 mg Oral Q12H   Followed by   Melene Muller ON 01/11/2023] dexamethasone  2 mg Oral Daily   doxycycline  100 mg Oral Q12H   heparin  5,000 Units Subcutaneous Q8H   insulin aspart  0-6 Units Subcutaneous TID WC   loratadine  10 mg Oral Daily   melatonin  3 mg Oral QHS   naphazoline-glycerin  1-2 drop Left Eye TID   OXcarbazepine  300 mg Oral BID   pantoprazole  40 mg Oral Daily  polyethylene glycol  17 g Oral BID   pregabalin  100 mg Oral TID   senna-docusate  2 tablet Oral BID   urea  30 g Oral BID    Medications Prior to Admission  Medication Sig Dispense Refill   b complex vitamins capsule Take 1 capsule by mouth daily.     brexpiprazole (REXULTI) 2 MG TABS tablet Take 1 tablet (2 mg total) by mouth daily. Take 0.5 mg daily for one week, then increase to 1 mg daily 30 tablet 1   busPIRone (BUSPAR) 30 MG tablet Take 1 tablet (30 mg total) by mouth 2 (two) times daily. 180 tablet 1   doxazosin (CARDURA) 4 MG tablet Take 1 tablet (4 mg total) by mouth at bedtime. 90 tablet 1   levocetirizine (XYZAL) 5 MG tablet TAKE 1 TABLET BY MOUTH EVERY DAY IN THE EVENING 90 tablet 1   nebivolol (BYSTOLIC) 5 MG tablet TAKE 1 TABLET (5 MG TOTAL) BY MOUTH DAILY. 90 tablet 0   Oxcarbazepine (TRILEPTAL) 300 MG tablet Take 1 tablet (300 mg total) by  mouth 2 (two) times daily. 60 tablet 0   diphenhydrAMINE (BENADRYL) 50 MG tablet Take 2 tab of 50 mg prior to CT angiogram. 2 tablet 0   EPINEPHrine 0.3 mg/0.3 mL IJ SOAJ injection Inject 0.3 mg into the muscle once as needed (for a severe, allergic reaction).      metoprolol tartrate (LOPRESSOR) 50 MG tablet Take 2 hours before CT scan 1 tablet 0   predniSONE (DELTASONE) 50 MG tablet Take 50 mg (one tablet) 13 hours before procedure. Take 50 mg (once tablet) 7 house before procedure. Take 50 mg (one tablet) before leaving home before procedure, as well as 50 mg of Benadryl. 3 tablet 0    ALLERGIES:   Allergies  Allergen Reactions   Iodinated Contrast Media Rash and Anaphylaxis   Penicillins Shortness Of Breath and Rash    Did it involve swelling of the face/tongue/throat, SOB, or low BP? Yes Did it involve sudden or severe rash/hives, skin peeling, or any reaction on the inside of your mouth or nose? No Did you need to seek medical attention at a hospital or doctor's office? No When did it last happen? Unk If all above answers are "NO", may proceed with cephalosporin use.     Shellfish Allergy Anaphylaxis and Rash   Shellfish-Derived Products Anaphylaxis and Rash    FAM HX: Family History  Problem Relation Age of Onset   Cancer Other        brain cancer   Cancer Father        skin cancer   Cancer Maternal Grandmother 49       brain cancer   Alcohol abuse Brother    Alzheimer's disease Paternal Grandmother    Dementia Paternal Grandmother    Heart disease Paternal Grandfather     Social History:   reports that he quit smoking about 11 years ago. His smoking use included cigarettes. He has never used smokeless tobacco. He reports that he does not currently use alcohol after a past usage of about 14.0 standard drinks of alcohol per week. He reports that he does not use drugs.  ROS: ROS: all other systems reviewed and are negative except as per HPI  Blood pressure (!)  136/90, pulse 63, temperature 97.6 F (36.4 C), temperature source Oral, resp. rate 17, height 6\' 1"  (1.854 m), weight 102.3 kg, SpO2 99 %. PHYSICAL EXAM: Physical Exam GEN NAD, lying in bed  HEENT wearing glasses NECK no JVD PULM clear CV RRR ABD soft EXT no LE edema NEURO AAO x 3, + R sided craniotomy, L sided weakness    Results for orders placed or performed during the hospital encounter of 01/06/23 (from the past 48 hour(s))  Glucose, capillary     Status: Abnormal   Collection Time: 01/07/23  4:36 PM  Result Value Ref Range   Glucose-Capillary 122 (H) 70 - 99 mg/dL    Comment: Glucose reference range applies only to samples taken after fasting for at least 8 hours.  Glucose, capillary     Status: None   Collection Time: 01/07/23  9:07 PM  Result Value Ref Range   Glucose-Capillary 98 70 - 99 mg/dL    Comment: Glucose reference range applies only to samples taken after fasting for at least 8 hours.   Comment 1 Notify RN   Basic metabolic panel     Status: Abnormal   Collection Time: 01/08/23  5:39 AM  Result Value Ref Range   Sodium 131 (L) 135 - 145 mmol/L   Potassium 4.4 3.5 - 5.1 mmol/L   Chloride 98 98 - 111 mmol/L   CO2 24 22 - 32 mmol/L   Glucose, Bld 113 (H) 70 - 99 mg/dL    Comment: Glucose reference range applies only to samples taken after fasting for at least 8 hours.   BUN 26 (H) 6 - 20 mg/dL   Creatinine, Ser 1.61 0.61 - 1.24 mg/dL   Calcium 8.9 8.9 - 09.6 mg/dL   GFR, Estimated >04 >54 mL/min    Comment: (NOTE) Calculated using the CKD-EPI Creatinine Equation (2021)    Anion gap 9 5 - 15    Comment: Performed at [facility], [address] [place], [place] [address]  Glucose, capillary     Status: Abnormal   Collection Time: 01/08/23  6:04 AM  Result Value Ref Range   Glucose-Capillary 109 (H) 70 - 99 mg/dL    Comment: Glucose reference range applies only to samples taken after fasting for at least 8 hours.   Comment 1 Notify RN   Glucose,  capillary     Status: Abnormal   Collection Time: 01/08/23 11:16 AM  Result Value Ref Range   Glucose-Capillary 103 (H) 70 - 99 mg/dL    Comment: Glucose reference range applies only to samples taken after fasting for at least 8 hours.  Glucose, capillary     Status: Abnormal   Collection Time: 01/08/23  4:22 PM  Result Value Ref Range   Glucose-Capillary 133 (H) 70 - 99 mg/dL    Comment: Glucose reference range applies only to samples taken after fasting for at least 8 hours.  Glucose, capillary     Status: Abnormal   Collection Time: 01/08/23  8:54 PM  Result Value Ref Range   Glucose-Capillary 110 (H) 70 - 99 mg/dL    Comment: Glucose reference range applies only to samples taken after fasting for at least 8 hours.   Comment 1 Notify RN   Osmolality     Status: None   Collection Time: 01/09/23  5:39 AM  Result Value Ref Range   Osmolality 290 275 - 295 mOsm/kg    Comment: REPEATED TO VERIFY Performed at [facility], [address] [place], [place] [address]   Glucose, capillary     Status: None   Collection Time: 01/09/23  6:22 AM  Result Value Ref Range   Glucose-Capillary 99 70 -  99 mg/dL    Comment: Glucose reference range applies only to samples taken after fasting for at least 8 hours.   Comment 1 Notify RN   Albumin     Status: Abnormal   Collection Time: 01/09/23  9:03 AM  Result Value Ref Range   Albumin 3.3 (L) 3.5 - 5.0 g/dL    Comment: Performed at [facility], [address] [place], [place] [address]  Phosphorus     Status: None   Collection Time: 01/09/23  9:03 AM  Result Value Ref Range   Phosphorus 3.6 2.5 - 4.6 mg/dL    Comment: Performed at [facility], [address], [place], [place] [address]  CBC with Differential/Platelet     Status: Abnormal   Collection Time: 01/09/23  9:04 AM  Result Value Ref Range   WBC 12.2 (H) 4.0 - 10.5 K/uL   RBC 3.45 (L) 4.22 - 5.81 MIL/uL   Hemoglobin 11.5 (L) 13.0 - 17.0 g/dL   HCT 41.3 (L) 24.4  - 52.0 %   MCV 96.2 80.0 - 100.0 fL   MCH 33.3 26.0 - 34.0 pg   MCHC 34.6 30.0 - 36.0 g/dL   RDW 01.0 27.2 - 53.6 %   Platelets 206 150 - 400 K/uL   nRBC 0.2 0.0 - 0.2 %   Neutrophils Relative % 82 %   Neutro Abs 10.0 (H) 1.7 - 7.7 K/uL   Lymphocytes Relative 16 %   Lymphs Abs 2.0 0.7 - 4.0 K/uL   Monocytes Relative 2 %   Monocytes Absolute 0.2 0.1 - 1.0 K/uL   Eosinophils Relative 0 %   Eosinophils Absolute 0.0 0.0 - 0.5 K/uL   Basophils Relative 0 %   Basophils Absolute 0.0 0.0 - 0.1 K/uL   WBC Morphology See Note     Comment: Mild Left Shift. 1 to 5% Metas, occ myelo   nRBC 1 (H) 0 /100 WBC   Abs Immature Granulocytes 0.00 0.00 - 0.07 K/uL   Polychromasia PRESENT     Comment: Performed at [facility], [address], [place], [place] [address]  Basic metabolic panel     Status: Abnormal   Collection Time: 01/09/23  9:04 AM  Result Value Ref Range   Sodium 127 (L) 135 - 145 mmol/L   Potassium 4.2 3.5 - 5.1 mmol/L   Chloride 95 (L) 98 - 111 mmol/L   CO2 21 (L) 22 - 32 mmol/L   Glucose, Bld 119 (H) 70 - 99 mg/dL    Comment: Glucose reference range applies only to samples taken after fasting for at least 8 hours.   BUN 28 (H) 6 - 20 mg/dL   Creatinine, Ser 4.74 0.61 - 1.24 mg/dL   Calcium 8.5 (L) 8.9 - 10.3 mg/dL   GFR, Estimated >25 >95 mL/min    Comment: (NOTE) Calculated using the CKD-EPI Creatinine Equation (2021)    Anion gap 11 5 - 15    Comment: Performed at [facility], [address], [place], [place] [address]  Glucose, capillary     Status: Abnormal   Collection Time: 01/09/23 12:02 PM  Result Value Ref Range   Glucose-Capillary 110 (H) 70 - 99 mg/dL    Comment: Glucose reference range applies only to samples taken after fasting for at least 8 hours.    No results found.  Assessment/Plan Hyponatremia: - initially thought to be d/t SIADH - will order urine and serum electrolytes - pt needs  to be on a fluid restriction that includes gatorade  in the fluid restriction (since most of its osms come from glucose rather than sodium it is not effective at maintaining Na levels)- have rewritten order - increase UreNa to 30 mg BID - strict I/O--> less likelihood of CSW   2.  Anaplastic oligodendroma  - s/p resection at [facility] 12/29/22  - in CIR  - on Dex taper  3.  Avascular necrosis of both hips  4.  Dispo: in CIR  Brier Reid, Lanora Manis 01/09/2023, 2:25 PM

## 2023-01-09 NOTE — Progress Notes (Signed)
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   Depression    Epistaxis    Headache    Hypertension    PTSD (post-traumatic stress disorder)    Seizures (HCC)    Past Surgical History:  Past Surgical History:  Procedure Laterality Date   BASAL CELL CARCINOMA EXCISION  1995   CRANIOTOMY FOR TUMOR Right 02/25/2011   IR ANGIO EXTERNAL CAROTID SEL EXT CAROTID BILAT MOD SED  11/16/2021   IR ANGIO INTRA EXTRACRAN SEL COM CAROTID INNOMINATE UNI R MOD SED  11/16/2021   IR ANGIO INTRA EXTRACRAN SEL INTERNAL CAROTID UNI L MOD SED  11/16/2021   IR ANGIO VERTEBRAL SEL VERTEBRAL UNI R MOD SED  11/16/2021   IR ANGIOGRAM FOLLOW UP STUDY  11/17/2021   IR NEURO EACH ADD'L AFTER BASIC UNI LEFT (MS)  11/17/2021   IR NEURO EACH ADD'L AFTER BASIC UNI RIGHT (MS)  11/17/2021   IR TRANSCATH/EMBOLIZ  11/16/2021   IR US GUIDE VASC ACCESS RIGHT  11/16/2021   RADIOLOGY WITH ANESTHESIA N/A 11/16/2021   Procedure: IR WITH ANESTHESIA;  Surgeon: Baldemar Lenis, MD;  Location: [facility] OR;  Service: Radiology;  Laterality: N/A;   TOTAL HIP ARTHROPLASTY Right 12/21/2021   Procedure: RIGHT TOTAL HIP REPLACEMENT;  Surgeon: Tarry Kos, MD;  Location: [facility] OR;  Service: Orthopedics;  Laterality: Right;  3-C   WISDOM TOOTH EXTRACTION     Social History:  reports that he quit smoking about 11 years ago. His smoking use included cigarettes. He has never used smokeless tobacco. He reports that he does not currently use alcohol after a past usage of about 14.0 standard drinks of alcohol per week. He reports that he does not use drugs.  Family / Support  Systems Marital Status: Married How Long?: 20 years Patient Roles: Spouse, Parent Spouse/Significant Other: Shawn York (wife) [phone] Children: Two children- 64 y.o son and 4 y.o. dtr. Other Supports: parents Anticipated Caregiver: Wife will be primary caregiver at night, and pt parents during the day. Ability/Limitations of Caregiver: Pt wife works during the day and only available at night. Caregiver Availability: 24/7 Family Dynamics: Pt lives with his wife and children.  Social History Preferred language: English Religion:  Cultural Background: Pt has worked in Engineering geologist for 12 years. Education: some Charity fundraiser - How often do you need to have someone help you when you read instructions, pamphlets, or other written material from your doctor or pharmacy?: Never Writes: Yes Employment Status: Employed Name of Employer: [employer] Length of Employment: 12 (yrs) Return to Work Plans: TBD. Legal History/Current Legal Issues: Denies Guardian/Conservator: N/A   Abuse/Neglect Abuse/Neglect Assessment Can Be Completed: Yes Physical Abuse: Denies Verbal Abuse: Denies Sexual Abuse: Denies Exploitation of patient/patient's resources: Denies Self-Neglect: Denies  Patient response to: Social Isolation - How often do you feel lonely or isolated from those around you?: Sometimes  Emotional Status Pt's affect, behavior and adjustment status: Pt tearful during encoutner when discussing golas while in rehab. Adjusting to current physical limitations. Recent Psychosocial Issues: See above Psychiatric History: Admits to depression and anxiety; Psych- Shawn York at [place], and therapist- Shawn York at [facility]. Takes  buspar, rixalti, and prozac. Substance Abuse History: Admits he quit smoking cigarettes 12 yrs ago due to cancer. Admits to "most days" use of beer atleast 102 in one encounter, and marijuana.  Patient / Family Perceptions, Expectations &  Goals Pt/Family understanding of illness & functional limitations: Pt and family have a general understanding of pt care needs Premorbid pt/family roles/activities: Independent Anticipated changes in roles/activities/participation: Assistance with ADLs/IADLs Pt/family expectations/goals: Pt goal is to work towards independence.  Community Resources [employer]: None Premorbid Home Care/DME Agencies: None Transportation available at discharge: TBD Is the patient able to respond to transportation needs?: Yes In the past 12 months, has lack of transportation kept you from medical appointments or from getting medications?: No In the past 12 months, has lack of transportation kept you from meetings, work, or from getting things needed for daily living?: No Resource referrals recommended: Neuropsychology  Discharge Planning Living Arrangements: Spouse/significant other, Children Support Systems: Spouse/significant other, Parent Type of Residence: Private residence Insurance Resources: Media planner (specify) Herbalist) Financial Resources: Restaurant manager, fast food Screen Referred: No Living Expenses: Banker Management: Spouse Does the patient have any problems obtaining your medications?: No Home Management: Pt prepares all meals; and, pt and his wife manage housekeeping needs. Patient/Family Preliminary Plans: Wife and parents Care Coordinator Barriers to Discharge: Insurance for SNF coverage, Pending chemo/radiation Care Coordinator Anticipated Follow Up Needs: HH/OP Expected length of stay: 10-12 days  Clinical Impression SW met with pt in room to introduce self, explain role, and discuss discharge process. Pt is not a veterans. HCPOA is his wife Shawn York. No HCPOA on file here. DME: RW and cane (from hip surgery last year).   Shawn York 01/09/2023, 1:41 PM

## 2023-01-09 NOTE — Telephone Encounter (Signed)
Pt's wife, [name], LVM @ 9:30a.  She is on the [facility].  She said pt had to have another brain surgery and is currently in rehab.  She wants to discuss some medicine change with Shanda Bumps.  No upcoming appt scheduled

## 2023-01-09 NOTE — Progress Notes (Signed)
Msg left at Dr. Caleen Jobs office re: need for follow up and question rescheduling of appt. Patient believes that this is to discuss results of gene mapping for therapy and that  MRI is just for follow up/no XRT planned and can be done here. Will await input from [facility].

## 2023-01-09 NOTE — Patient Instructions (Addendum)
Blue Foam Block  Key pinch/ game show buzzer 10X  Finger tip pinch 5-10X (very gentle pinch)  Squeeze and release 10X    AROM: Finger Flexion / Extension   Actively bend fingers of left hand. Start with knuckles furthest from palm, and slowly make a fist. Hold __2-3__ seconds. Relax. Then straighten fingers as far as possible. Repeat __5-10__ times per set.      Towel Roll Squeeze   With right forearm resting on surface, gently squeeze towel. Repeat _10___ times per set.   Copyright  VHI. All rights reserved.   Abduction / Adduction (Active)    With hand flat on table, spread all fingers apart, then bring them together as close as possible. Repeat __10__ times. Do ____ sessions per day.  AROM: Wrist Extension   With right palm down, bend wrist up. Repeat __5-10__ times per set. Do ____ sets per session. Do ____ sessions per day.     AROM: Forearm Pronation / Supination   With right arm in handshake position, slowly rotate palm down until stretch is felt. Relax. Then rotate palm up until stretch is felt. Repeat __5-10__ times per set. Do ____ sets per session. Do ____ sessions per day.  Copyright  VHI. All rights reserved.   ELBOW FLEXION EXTENSION  Start with your arm at your side. Bend at your elbow to raise your forearm/hand upwards as shown. Then return to starting position and repeat. 10X     Shoulder shrugs 10X   1) ROM: Abduction (Standing)   Bring arms straight out from sides and raise as high as possible without pain. Repeat __10__ times per set. Do ____ sets per session. Do ____ sessions per day.  http://orth.exer.us/910   Copyright  VHI. All rights reserved.   2) Extension (Active) ROM: Extension (Standing)     4) Flexors Stretch (Active)   Stand, arms straight at sides. Bring arms straight forward and upward as high as possible without pain. Repeat _10__ times per session. Do ___ sessions per day.  Copyright  VHI. All  rights reserved.   5) Scapular Retraction (Standing)   With arms at sides, pinch shoulder blades together. Repeat __10__ times per set. Do ____ sets per session. Do ____ sessions per day.  http://orth.exer.us/944   Copyright  VHI. All rights reserved.      Sensory Re-Education Home Exercises:  Using a soft-bristled brush or a surgical scrub brush, use deep pressure to brush your arm from shoulder to fingertips and then fingertips to shoulder. Repeat brushing a second time using a lighter stroke. Repeat brushing a third time using an even lighter stroke.  Perform weight-bearing activities through your affected arm or hand on a scale. Propping on your elbow or hand through the scale.  Have someone place different objects in your hand while you are looking (i.e. cotton ball, marble, key, paper clip). Close your eyes and then try to identify objects as they are placed in your hand again one at a time.  Fill a flexible paper cup (i.e. Dixie cup) half full with water. Attempt to grasp cup without spilling the water or smashing the cup. Use your vision to determine how much pressure you are putting on the cup (i.e. if cup is slipping out of hand, apply more pressure; if cup is squeezed to hard, lessen grip)  Feel an object then try to find a matching object inside bowl of dry beans or rice.  Close eyes and have someone else position your affected arm.  See if you can tell what position your arm is in (i.e. my elbow is bent) then open your eyes to see what position it is in.

## 2023-01-09 NOTE — Progress Notes (Signed)
Physical Therapy Session Note  Patient Details  Name: Shawn York MRN: [id number] Date of Birth: 1976/07/01  Today's Date: 01/09/2023 PT Individual Time: [phone] and [id number] PT Individual Time Calculation (min): 57 min   Short Term Goals: Week 1:  PT Short Term Goal 1 (Week 1): Patient will complete bed mobility with Supv PT Short Term Goal 2 (Week 1): Patient will ambulate >200' with CGA PT Short Term Goal 3 (Week 1): Patient will ascend/descend 12 steps with CGA  Skilled Therapeutic Interventions/Progress Updates:     1st Session: Pt received supine in bed and agrees to therapy. Reports slight headache pain. PT provides rest breaks as needed to manage pain. Pt performs bed mobility with cues for positioning. Sit to stand with CGA and cues for initiation. Pt ambulates x100' with minA and slight L lean. Cues for upright posture and navigation. Following rest break, pt completes standing balance activity with challenge of tapping alternating toes on orange cones placed in front of of pt. Mirror positioned for visual feedback. Pt requires minA overall with tendency to lose balance to the L. PT provides cues for engagement of L lower extremity and breaking down task into component parts to improve sequencing and safety. Pt ambulates x300' to ortho gym with light minA on L side and cues for upright gaze to improve posture and balance. Pt takes seated rest break prior to ambulating additional 300' with same cues and assistance provided.4  Pt performs trunk rotations with alternating staggered stance and one foot propped on 5 inch step. Performed for balance training and core strengthening. Pt completes initially holding onto soccer ball with both hands but has difficulty maintaining grip with L upper extremity. Pt completes 2x10 with minA/modA and multiple LOBs to the L.   Pt ambulates back to room. Left seated in recliner with all needs within reach.   2nd Session: Pt received supine in bed  with eyes closed. Easily awakens to verbal stimuli and agrees to therapy. Supine to sit with cues for positioning. Pt stands and ambulates x125' to gym with light minA due to L lateral bias. PT provides cues for midline orientation and upright gaze to improve balance. Pt completes Nustep for coordination training and L hemibody NMR. Pt completes with L hand splint to facilitate grip, x12:00 at workload of 5 with average steps per minute ~40. PT provides cues for hand and foot placement and completing full available ROM. Pt verbalizes need to urinate. Pt ambulates to toilet with light minA ands for navigation. Following, pt stands and requires assistance to pull up L side of pants.   Pt tasked with ambulating while holding onto 3lb hand weight. Pt able to grip weight but unable to maintain grip more than several steps. PT provides hand over hand manual assistance to maintain grip. Pt ambulates x175' with mod facilitation of L and grip. Pt then given 2lb weight and is able to complete x175' without dropping weight. Following rest break, pt attempts again and drops weight 4 to 5 times, appearing to be more fatigued than previous bout.   Pt ambulates back to room with same assistance and cues. Left supine in bed with all needs within reach.   Therapy Documentation Precautions:  Precautions Precautions: Fall Precaution Comments: L hemi, R crani Restrictions Weight Bearing Restrictions: No    Therapy/Group: Individual Therapy  Beau Fanny, PT, DPT 01/09/2023, 5:13 PM

## 2023-01-09 NOTE — Progress Notes (Signed)
Occupational Therapy Session Note  Patient Details  Name: Shawn York MRN: [id number] Date of Birth: [birth year]  Today's Date: 01/09/2023 OT Individual Time: 1050-1200 OT Individual Time Calculation (min): 70 min    Short Term Goals: Week 1:  OT Short Term Goal 1 (Week 1): Pt will groom in standing with CGA OT Short Term Goal 2 (Week 1): Pt will don shirt wiht MIN A OT Short Term Goal 3 (Week 1): PT will complete TTB wiht CGA OT Short Term Goal 4 (Week 1): Pt will demo active digit extension to demo improved NMR  Skilled Therapeutic Interventions/Progress Updates:    Patient agreeable to participate in OT session. Reports headache pain although no number provided.  Pt reports that he isn't due to any pain medication yet.   Patient participated in skilled OT session focusing on ADL re-training, functional mobility, and NM re-education. Pt completed bathing at shower level using tub bench and grab bar. Pt completed functional mobility from bed to shower with min guard and no AD.VC provided for safety awareness such as sitting in to center of tub bench versus at the end. Pt completed UB bathing with Min A while seated. LB bathing completed at SBA with pt seated and standing to complete. Once done with shoulder, pt navigated from shower to sitting EOB to complete dressing, VC provided during walking to pick up left foot if it began to drag.  Pt education on hemi dressing technique for UB dressing with min A provided to complete. LB dressing completed at SBA.   Pt navigated from room to day room with CGA and no device.  Pt educated on UE HEP including hand strength with foam block, hand, wrist, elbow, and shoulder A/ROM exercises. Handout provided as reference. Also included sensory re-education strategies in handout and reviewed with pt.  Therapist facilitated LUE muscle activation utilizing muscle vibration in order to facilitate greater muscle movement and ability to utilize LUE to complete  functional ADL tasks.    Therapy Documentation Precautions:  Precautions Precautions: Fall Precaution Comments: L hemi, R crani Restrictions Weight Bearing Restrictions: No   Therapy/Group: Individual Therapy  Limmie Patricia, OTR/L,CBIS  Supplemental OT - [facility] and [facility] Secure Chat Preferred   01/09/2023, 8:01 AM

## 2023-01-09 NOTE — Telephone Encounter (Signed)
Patient had brain surgery and suffered a stroke. He has been put on steroids and his anxiety is pretty intense. Wife was asking if medications can be adjusted, said that the staff seem to be struggling with what to do for patient. Told her that we could see their notes, they could see our notes, and they could consult with Shanda Bumps if needed, but that as long he is inpatient that they take over the treatment and we would pick back up when he was discharged.

## 2023-01-09 NOTE — Progress Notes (Signed)
Patient ID: Shawn York, male   DOB: 05-Dec-1975, 47 y.o.   MRN: [id number]   [phone]- SW spoke with [name] to introduce self, explain role, discuss discharge process, and inform on ELOS. Confirms that there will be support from his parents during the day, and she will be there are night. SW informed will follow-up with update after team conference tomorrow.   [name], MSW, LCSWA Office: [phone] Cell: [phone] Fax: [phone]

## 2023-01-10 ENCOUNTER — Other Ambulatory Visit: Payer: Self-pay | Admitting: Surgery

## 2023-01-10 DIAGNOSIS — K148 Other diseases of tongue: Secondary | ICD-10-CM | POA: Diagnosis not present

## 2023-01-10 DIAGNOSIS — I7121 Aneurysm of the ascending aorta, without rupture: Secondary | ICD-10-CM

## 2023-01-10 DIAGNOSIS — R059 Cough, unspecified: Secondary | ICD-10-CM | POA: Diagnosis not present

## 2023-01-10 DIAGNOSIS — R531 Weakness: Secondary | ICD-10-CM | POA: Diagnosis not present

## 2023-01-10 DIAGNOSIS — C719 Malignant neoplasm of brain, unspecified: Secondary | ICD-10-CM | POA: Diagnosis not present

## 2023-01-10 LAB — BASIC METABOLIC PANEL
Anion gap: 7 (ref 5–15)
BUN: 35 mg/dL — ABNORMAL HIGH (ref 6–20)
CO2: 28 mmol/L (ref 22–32)
Calcium: 9 mg/dL (ref 8.9–10.3)
Chloride: 96 mmol/L — ABNORMAL LOW (ref 98–111)
Creatinine, Ser: 0.95 mg/dL (ref 0.61–1.24)
GFR, Estimated: >60 mL/min (ref >60)
Glucose, Bld: 107 mg/dL — ABNORMAL HIGH (ref 70–99)
Potassium: 3.9 mmol/L (ref 3.5–5.1)
Sodium: 131 mmol/L — ABNORMAL LOW (ref 135–145)

## 2023-01-10 LAB — GLUCOSE, CAPILLARY: Glucose-Capillary: 103 mg/dL — ABNORMAL HIGH (ref 70–99)

## 2023-01-10 MED ORDER — ALBUTEROL SULFATE (2.5 MG/3ML) 0.083% IN NEBU: 3.0000 mL | INHALATION_SOLUTION | Freq: Four times a day (QID) | RESPIRATORY_TRACT | Status: DC | PRN

## 2023-01-10 MED ORDER — OXYCODONE HCL 5 MG PO TABS
5.0000 mg | ORAL_TABLET | Freq: Four times a day (QID) | ORAL | Status: DC | PRN
  Administered 2023-01-10 – 2023-01-18 (×9): 5 mg via ORAL
  Filled 2023-01-10 (×11): qty 1

## 2023-01-10 MED ORDER — ACETAMINOPHEN 500 MG PO TABS
1000.0000 mg | ORAL_TABLET | Freq: Three times a day (TID) | ORAL | Status: DC
  Administered 2023-01-10 – 2023-01-18 (×24): 1000 mg via ORAL
  Filled 2023-01-10 (×24): qty 2

## 2023-01-10 NOTE — Progress Notes (Signed)
Occupational Therapy Session Note  Patient Details  Name: Shawn York MRN: [id number] Date of Birth: 1976-05-15  Today's Date: 01/10/2023 OT Individual Time: 0900-1000 OT Individual Time Calculation (min): 60 min    Short Term Goals: Week 1:  OT Short Term Goal 1 (Week 1): Pt will groom in standing with CGA OT Short Term Goal 2 (Week 1): Pt will don shirt wiht MIN A OT Short Term Goal 3 (Week 1): PT will complete TTB wiht CGA OT Short Term Goal 4 (Week 1): Pt will demo active digit extension to demo improved NMR  Skilled Therapeutic Interventions/Progress Updates:    Pt received in recliner with 6 out of 10 pain in head. Rest/repositioning provided for pain relief  ADL: Pt completes ADL at overall CGA Level. Skilled interventions include: HOH A to maintain grasp on washcloth while using LUE to bathe at shower level, CGA for UB/LEB dressing at sit to stand level, and min L lean noted with fatigue. Toileting completed with CGA seated for bladder void.  Neuromuscular Reeducation Tall kneeling on mat table with supervision for tall kneeling but insufficient WB into LUE then transitioned to quadruped. Pt demo L lean in quadruped and needed MAX encouragement to weight shift to alight shoulder over wrist and L hip over knee while reaching across midline to obtain peg pieces and place into peg board. Pt then provided HOH A to grossly grasp peg, remove and release  into bucket.   Reaching in kitchen- standing functional reach moving items from countertop height to lowest shelf height with facilitation of open/close of LUE on bowls/plates. Standing open/close of refrigerator door and freezer door with pt able to move thumb around for cylindrical grasp on handle! Pt very frustrated and somewhat tearful that hand is not doing "what it is supposed to be doing"   [place]o Stim One 330 pulse width 35 Hz pulse rate On 8 sec/ off 8 sec Ramp up/ down 2 sec Symmetrical Biphasic wave form  Max intensity  at 500 Ohm load  Pt left at end of session in bed with exit alarm on, call light in reach and all needs met   Therapy Documentation Precautions:  Precautions Precautions: Fall Precaution Comments: L hemi, R crani Restrictions Weight Bearing Restrictions: No Therapy/Group: Individual Therapy  [name] 01/10/2023, 8:58 AM

## 2023-01-10 NOTE — Progress Notes (Signed)
[facility] Progress Note   Assessment/ Plan:   Hyponatremia: - initially thought to be d/t SIADH - repeat urine and serum electrolytes confirm SIADH - pt needs to be on a fluid restriction that includes gatorade in the fluid restriction (since most of its osms come from glucose rather than sodium it is not effective at maintaining Na levels)- have rewritten order, pt has been informed as well and is adherent, would change signage on pt's room - continue UreNa to 30 g BID - anticipate improvement over the next several days.  Would say that when Na is up to 135 can decrease UreNa back to 15 g BID and resume taper as written in d/c instructions from [facility] (15 g BID x 10 days then off) - nothing else to suggest at this time- will sign off, call with questions   2.  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  Avascular necrosis of both hips   4.  Dispo: in CIR  Subjective:    Seen in room.  Urine/ serum lytes confirm SIADH.  Na up to 131 again today.  No complaints.   Objective:   BP 124/86 (BP Location: Right Arm)   Pulse (!) 57   Temp 98 F (36.7 C)   Resp 18   Ht 6\' 1"  (1.854 m)   Wt 102.3 kg   SpO2 99%   BMI 29.76 kg/m   Intake/Output Summary (Last 24 hours) at 01/10/2023 1215 Last data filed at 01/10/2023 0805 Gross per 24 hour  Intake 685 ml  Output 1200 ml  Net -515 ml   Weight change:   Physical Exam: GEN NAD, sitting in chair HEENT wearing glasses NECK no JVD PULM clear CV RRR ABD soft EXT no LE edema NEURO AAO x 3, + R sided craniotomy, L sided weakness, E stim on L arm  Imaging: DG Chest Port 1 View  Result Date: 01/09/2023 CLINICAL DATA:  Cough EXAM: PORTABLE CHEST 1 VIEW COMPARISON:  01/06/2023 FINDINGS: The heart size and mediastinal contours are within normal limits. Both lungs are clear. The visualized skeletal structures are unremarkable. Postsurgical  changes in the cervical spine are noted. IMPRESSION: No active disease. Electronically Signed   By: Alcide Clever M.D.   On: 01/09/2023 19:15    Labs: BMET Recent Labs  Lab 01/07/23 0532 01/08/23 0539 01/09/23 0903 01/09/23 0904 01/10/23 0642  NA 133* 131*  --  127* 131*  K 4.3 4.4  --  4.2 3.9  CL 99 98  --  95* 96*  CO2 23 24  --  21* 28  GLUCOSE 105* 113*  --  119* 107*  BUN 24* 26*  --  28* 35*  CREATININE 0.82 0.96  --  0.92 0.95  CALCIUM 8.7* 8.9  --  8.5* 9.0  PHOS  --   --  3.6  --   --    CBC Recent Labs  Lab 01/07/23 0532 01/09/23 0904  WBC 13.2* 12.2*  NEUTROABS 9.4* 10.0*  HGB 11.0* 11.5*  HCT 32.0* 33.2*  MCV 95.0 96.2  PLT 220 206    Medications:     acetaminophen  1,000 mg Oral TID   bacitracin   Topical Daily  brexpiprazole  2 mg Oral Daily   busPIRone  30 mg Oral BID WC   dexamethasone  2 mg Oral Q12H   Followed by   Melene Muller ON 01/11/2023] dexamethasone  2 mg Oral Daily   doxycycline  100 mg Oral Q12H   heparin  5,000 Units Subcutaneous Q8H   loratadine  10 mg Oral Daily   melatonin  3 mg Oral QHS   naphazoline-glycerin  1-2 drop Left Eye TID   OXcarbazepine  300 mg Oral BID   pantoprazole  40 mg Oral Daily   polyethylene glycol  17 g Oral BID   pregabalin  100 mg Oral TID   senna-docusate  2 tablet Oral BID   urea  30 g Oral BID    Bufford Buttner MD 01/10/2023, 12:15 PM

## 2023-01-10 NOTE — Progress Notes (Signed)
Physical Therapy Session Note  Patient Details  Name: Shawn York MRN: [id number] Date of Birth: [birth year]  Today's Date: 01/10/2023 PT Individual Time: 1120-1200 and 1515-1550 PT Individual Time Calculation (min): 40 min and 35 min and Today's Date: 01/10/2023 PT Missed Time: 40 Minutes Missed Time Reason: Patient fatigue  Short Term Goals: Week 1:  PT Short Term Goal 1 (Week 1): Patient will complete bed mobility with Supv PT Short Term Goal 2 (Week 1): Patient will ambulate >200' with CGA PT Short Term Goal 3 (Week 1): Patient will ascend/descend 12 steps with CGA  Skilled Therapeutic Interventions/Progress Updates:     1st session:  Pt received seated in recliner and agrees to therapy. Reports headache. PT provides rest breaks to manage pain. Pt performs sit to stand with CGA and cues for initiation. Pt ambulates x150' to gym with light minA due to persistent L sided bias, despite cues to correct. PT places  4lb ankle eight on L lower extremity for NM feedback and pt ambulates 175' with same assistance and cues. Following rest break, pt completes 6 minute walk test with cognitive component added, tasked with naming items one can purchase at a grocery store with each letter of the alphabet. Pt ambulates x1011' with CGA progressing to minA with fatigue, as L sided bias increases with distance and fatigue. Pt noted to have decreased ability to complete cognitive task and requires occasional verbal cues for accurate completion. Pt then tasked with completing Nustep as slowly as necessary to maintain L hand grip on handle. Completed for NMR and to challenge attention to L side. Pt requires hand over hand assistance for majority of task but is able to maintain grip for 2-3 cycles at a time if cued to move extremely slowly. Pt ambulates back to room with same cues and assistance, voids bladder in toilet, and ambulates back to recliner. Left seated in recliner with alarm intact and all needs within  reach.   2nd Session: Pt received seated in recliner and agrees to therapy. Reports headache that worsens with cough. RN notified and arrives to provide pt with pain medication. Pt performs sit to stand with CGA. Pt ambulates from room to outside area, navigating crowded hallways, elevator, doorways, and thresholds with CGA/minA with persistent slight L sided bias. PT provides cues for upright gaze to improve posture and balance, and ensuring that pt attends to L stride and increasing step height, as he tends to drag foot with fatigue. Pt takes extended seated rest break at picnic table outside with cues for sequencing of transition to sitting. Following rest break, pt tasked with path finding back to room, requiring occasional cues and continued minA for balance deficits. Pt requests to rest with family following ambulation. Pt misses 40 minutes of skilled PT due to fatigue. Pt left seated in recliner with all needs within reach.    Therapy Documentation Precautions:  Precautions Precautions: Fall Precaution Comments: L hemi, R crani Restrictions Weight Bearing Restrictions: No   Therapy/Group: Individual Therapy  Beau Fanny, PT, DPT 01/10/2023, 4:20 PM

## 2023-01-10 NOTE — Patient Care Conference (Signed)
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 Elijah Birk Social Worker Present: [name], LCSWA Nurse Present: Vedia Pereyra, RN PT Present: Malachi Pro, PT OT Present: Other (comment) Blanch Media, OT) SLP Present: Feliberto Gottron, SLP PPS Coordinator present : Fae Pippin, SLP     Current Status/Progress Goal Weekly Team Focus  Bowel/Bladder   continent of b/b; LBM: 5/13   remain continent   assist with toileting needs prn    Swallow/Nutrition/ Hydration   Eval Pending 5/15           ADL's   CGA ambulation without device, Min A UB dressing/bathing, Min A LB dressing, SBA LB bathing. left hemi   SBA-Mod I   L NM re-ed, balance, hemi dressing strategies, ADL re-training    Mobility   supervision bed mobility, CGA sit to stand, minA gait with persistent L sided lean that worsens with fatigue   supervision  high level balance, L sided NMR, ambulation, endurance    Communication   Eval Pending 5/15            Safety/Cognition/ Behavioral Observations  Eval Pending 5/15            Pain   c/o pain to R head; PRN oxy   pain level <5/10   assess pain QS and prn    Skin   R crani-staples/sutures OTA   remain free of new skin breakdown/infection  assess skin QS and prn      Discharge Planning:  D/c to home with wife. Support from his parents during the day while wife is  working. SW will confirm there are no barriers to discharge.   Team Discussion: Oligodendroglima s/p resection. Hyponatremia/ increase in BUN-Nephrology addressing. fluid restriction. Tylenol scheduled/ Headaches.  Family reports exaggerated behaviors.  Gets frustrated with tasks when using LUE. E-stim use started. Tapering Decadron.  Patient would like to use Xyzal for allergy/cough. Patient has been instructed to bring medication from home.  Speech was added due to reports of issues with swallowing and feels like tongue has been swollen since surgery.  Speech evaluations pending.  Patient on target to meet rehab goals: yes, patient progressing with therapies   *See Care Plan and progress notes for long and short-term goals.   Revisions to Treatment Plan:  Medication adjustments. Nephrology consult. E-stim use. Speech consult. Neuro-psych consult.   Teaching Needs: Medications, safety, gait/transfer training, self care, etc.   Current Barriers to Discharge: Decreased caregiver support, Medication compliance, and Behavior  Possible Resolutions to Barriers: Family education, nursing education, order recommended DME      Medical Summary Current Status: medically complicated by headache, cough, hypertension, and hyponatremia  Barriers to Discharge: Electrolyte abnormality;Behavior/Mood;Medical stability;Self-care education;Pending surgery/plan;Uncontrolled Hypertension;Uncontrolled Pain  Barriers to Discharge Comments: hyponatremia, hypertension on steroid wean, persistent HA c/b cough Possible Resolutions to [employer] Focus: titrate medications for pain and cough, nephrology assistance in managing hyponatremia,  lab and vitals monitorring   Continued Need for Acute Rehabilitation Level of Care: The patient requires daily medical management by a physician with specialized training in physical medicine and rehabilitation for the following reasons: Direction of a multidisciplinary  physical rehabilitation program to maximize functional independence : Yes Medical management of patient stability for increased activity during participation in an intensive rehabilitation regime.: Yes Analysis of laboratory values and/or radiology reports with any subsequent need for medication adjustment and/or medical intervention. : Yes   I attest that I was present, lead the team conference, and concur with the assessment and plan of the team.   Jearld Adjutant 01/10/2023, 12:50 PM

## 2023-01-10 NOTE — Progress Notes (Signed)
Physical Therapy Session Note  Patient Details  Name: Shawn York MRN: [id number] Date of Birth: 09-29-1975  Today's Date: 01/10/2023 PT Individual Time: 0800-0830 PT Individual Time Calculation (min): 30 min   Short Term Goals: Week 1:  PT Short Term Goal 1 (Week 1): Patient will complete bed mobility with Supv PT Short Term Goal 2 (Week 1): Patient will ambulate >200' with CGA PT Short Term Goal 3 (Week 1): Patient will ascend/descend 12 steps with CGA  Skilled Therapeutic Interventions/Progress Updates:  Patient greeted sitting EOB in his room with NT present and agreeable to PT treatment session. Patient reported having an "accident" and was unable to retrieve the urinal quick enough for void leading to urinating on his clothes and bed. Therapist assisted with providing clean clothes and VC for not stepping in the urine on the floor secondary to impaired problem-solving. While seated EOB, patient was able to doff soiled clothes and don clean clothes/socks and shoes with SBA. Patient gait trained to/from his room without the use of an AD and CGA/MinA- Minor VC for improved R lateral lean and L foot clearance throughout swing phase of gait.   Patient performed L foot taps to 8" step without UE support and CGA for safety, performed 3 x 10 total with seated rest break in between. Good foot clearance noted and mirror placed in front for external visual cues regarding posturing.   Patient performed the same activity as above, however with R UE and CGA/MinA for improved stability/safety, performed 3 x 10 with seated rest break in between. Multimodal cues for improved postural extension, activation L LE (glutes and quad) for improved midline posturing and decreased L lateral lean.   Patient returned to his room and left sitting in bedside recliner with posey belt on, call bell within reach and all needs met.    Therapy Documentation Precautions:  Precautions Precautions: Fall Precaution  Comments: L hemi, R crani Restrictions Weight Bearing Restrictions: No   Pain: No/Denies pain.    Therapy/Group: Individual Therapy  Raniyah Curenton 01/10/2023, 7:59 AM

## 2023-01-10 NOTE — Progress Notes (Signed)
PROGRESS NOTE   Subjective/Complaints: continues to C/o cough. HA persistent, pain scores 0-8 overnight.  Per nursing, he is stating his tongue is swollen post-op and preventing him from swallowing, clear speech.  1x episode urinary incontinence documented.    ROS: +cough - ongoing + HA-  ongoing Denies fevers, chills, CP, SOB, abd pain, N/V/D/C, or any other complaints at this time.    Objective:   DG Chest Port 1 View  Result Date: 01/09/2023 CLINICAL DATA:  Cough EXAM: PORTABLE CHEST 1 VIEW COMPARISON:  01/06/2023 FINDINGS: The heart size and mediastinal contours are within normal limits. Both lungs are clear. The visualized skeletal structures are unremarkable. Postsurgical changes in the cervical spine are noted. IMPRESSION: No active disease. Electronically Signed   By: Alcide Clever M.D.   On: 01/09/2023 19:15   Recent Labs    01/09/23 0904  WBC 12.2*  HGB 11.5*  HCT 33.2*  PLT 206    Recent Labs    01/09/23 0904 01/10/23 0642  NA 127* 131*  K 4.2 3.9  CL 95* 96*  CO2 21* 28  GLUCOSE 119* 107*  BUN 28* 35*  CREATININE 0.92 0.95  CALCIUM 8.5* 9.0     Intake/Output Summary (Last 24 hours) at 01/10/2023 0942 Last data filed at 01/10/2023 0805 Gross per 24 hour  Intake 685 ml  Output 1575 ml  Net -890 ml         Physical Exam: Vital Signs Blood pressure 124/86, pulse (!) 57, temperature 98 F (36.7 C), resp. rate 18, height 6\' 1"  (1.854 m), weight 102.3 kg, SpO2 99 %.  Constitutional: No apparent distress. Appropriate appearance for age. Sitting in gym HENT: No JVD. Neck Supple. Trachea midline. + R craniotomy, Healed scar to anterior R neck from prior ACDF. Normal appearing tongue, no swelling or erythema.  Eyes: PERRLA. EOMI. Visual fields grossly intact.  Cardiovascular: RRR, no murmurs/rub/gallops. No Edema. Peripheral pulses 2+  Respiratory: CTAB. No rales, rhonchi, or wheezing. On RA. +dry  cough-mildly improved Abdomen: + bowel sounds, normoactive. No distention or tenderness.  Skin: C/D/I. No apparent lesions aside from R craniotomy wound MSK:      No apparent deformity.       Strength:                RUE: 5/5 SA, 5/5 EF, 5/5 EE, 5/5 WE, 5/5 FF, 5/5 FA                 LUE: 4/5 throughout                RLE: 5/5 HF, 5/5 KE, 5/5 DF, 5/5 EHL, 5/5 PF                 LLE:  5-/5 HF, 5-/5 KE, 5/5 DF, 5/5 EHL, 5/5 PF   Neurologic exam: +HA with extreme lateral gaze bilaterally -not noted today Cognition: AAO to person, place, time and event.  + Significant motor delay in LUE > LLE Language: Fluent, No substitutions or neoglisms. No dysarthria. Names 3/3 objects correctly.  Memory: Recalls 3/3 objects at 5 minutes. No apparent deficits  Insight: Good  insight into current condition.  Mood:  Pleasant affect, appropriate mood.  Sensation: To light touch intact in BL UEs and LEs  Reflexes: 2+ in BL UE and LEs. + L Hoffman's  CN: + L facial droop + L  V2/3 sensory loss + L tongue deviation Coordination: Fine motor difficulty LUE Spasticity: MAS 0 in all extremities.   Assessment/Plan: 1. Functional deficits which require 3+ hours per day of interdisciplinary therapy in a comprehensive inpatient rehab setting. Physiatrist is providing close team supervision and 24 hour management of active medical problems listed below. Physiatrist and rehab team continue to assess barriers to discharge/monitor patient progress toward functional and medical goals  Care Tool:  Bathing    Body parts bathed by patient: Right arm, Left arm, Chest, Abdomen, Front perineal area, Buttocks, Right upper leg, Right lower leg, Left upper leg, Left lower leg, Face         Bathing assist Assist Level: Minimal Assistance - Patient > 75%     Upper Body Dressing/Undressing Upper body dressing   What is the patient wearing?: Pull over shirt    Upper body assist Assist Level: Moderate Assistance -  Patient 50 - 74%    Lower Body Dressing/Undressing Lower body dressing      What is the patient wearing?: Pants     Lower body assist Assist for lower body dressing: Moderate Assistance - Patient 50 - 74%     Toileting Toileting    Toileting assist Assist for toileting: Minimal Assistance - Patient > 75%     Transfers Chair/bed transfer  Transfers assist     Chair/bed transfer assist level: Minimal Assistance - Patient > 75%     Locomotion Ambulation   Ambulation assist      Assist level: Minimal Assistance - Patient > 75% Assistive device: No Device Max distance: 200+   Walk 10 feet activity   Assist     Assist level: Minimal Assistance - Patient > 75% Assistive device: No Device   Walk 50 feet activity   Assist    Assist level: Minimal Assistance - Patient > 75% Assistive device: No Device    Walk 150 feet activity   Assist    Assist level: Minimal Assistance - Patient > 75% Assistive device: No Device    Walk 10 feet on uneven surface  activity   Assist     Assist level: Minimal Assistance - Patient > 75% Assistive device: Other (comment) (No AD)   Wheelchair     Assist Is the patient using a wheelchair?: No             Wheelchair 50 feet with 2 turns activity    Assist            Wheelchair 150 feet activity     Assist          Blood pressure 124/86, pulse (!) 57, temperature 98 F (36.7 C), resp. rate 18, height 6\' 1"  (1.854 m), weight 102.3 kg, SpO2 99 %.  Medical Problem List and Plan: 1. 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 Lavonna Monarch, c/b MCA bleeding with [facility]             -patient may shower             -ELOS/Goals: 10-14 days, supervision to Mod I PT/OT; DC date 5/22   -Continue CIR   - started e-stim LUE  - 5/14: Per teams, CGA-Min A transfers and ADLs. Gets overwhelmed  and frustrated with L hand  functional tasks. Similar for PT. Doing very well overall, good motivation and family support. Needs tub bench at DC.    2.  Antithrombotics: -DVT/anticoagulation:  Pharmaceutical: Heparin 5000U q8h             -antiplatelet therapy: none   3. Pain Management: Tylenol and Robaxin as needed; oxycodone 10 mg q 4 hours prn             --Lyrica 100 mg TID for HA   - 5/14: Schedule Tylenol 1000 mg TID; decrease oxycodone to 5 mg Q6H PRN   4. Mood/Behavior/Sleep: LCSW to evaluate and provide emotional support. History of depressed mood, anxiety, PTSD             -antipsychotic agents: continue Rexulti 2 mg daily             -continue Buspar 30 mg BID  -Melatonin 3mg  QHS   5. Neuropsych/cognition: This patient is capable of making decisions on his own behalf.   6. Skin/Wound Care: Routine skin care checks             -monitor surgical incision (POD #8 on 5/10)   7. Fluids/Electrolytes/Nutrition: Routine Is and Os. Check CMET in am and daily BMET for 5-7 days.  -01/07/23 Na 133 (see #9), BUN marginally elevated at 24 but Cr WNL, BMP otherwise fairly unremarkable; monitor labs   -01/08/23 BMP stable from yesterday, see below for Na/AKI discussion Recent Labs    01/09/23 1629 01/09/23 2124 01/10/23 0603  GLUCAP 131* 111* 103*    -5/13: Blood sugars well-controlled despite steroid taper, discontinue CBGs  8: Seizure prophylaxis: Continue Trileptal 300 mg BID             --has stable auras w/epigastric rising and mild dissociation.    9: Hyponatremia: To Continue Ure NA for 10 days (end 01/16/23)  --Continue 1 Liter fluid restriction with Gatorade prn -01/07/23 Na 133, monitor on labs -01/08/23 Na slightly lower at 131, cont fluid restriction and regimen for now, monitor on daily labs   5/13: NA continues to downtrend, nephrology consulted, appreciate recommendations; increase Urease and stop Gatorade accommodations for fluid restriction per their recommendations.  Daily BMPs for  follow-up.  5/14: NA back to 131, continue nephrology recommendations  10: Post-op craniotomy: continue steroid taper: -Decadron 4 mg for 4 days, 2 mg BID for two days and then 2 mg once daily for one day --appt 05/15 10 am for Rad MRI [phone] and Dr. Burtis Junes office @ 3 pm at cancer center [phone]. -Discussing with social work regarding transportation versus rescheduling     11: Persistent Cough: On Doxycycline X 12 days per recommendations. Tessalon ineffective.  -01/07/23 CXR unremarkable, monitor cough, cont Robitussin PRN  5/13: Persistent; recheck chest x-ray, add flutter valve for secretions 5/14: CXR clear, flutter valve worsening. Can bring in home Xyzal to swap out for Claritin. Added PRN inhaler   12: Seasonal allergies: continue loratadine--advised to bring Xyzal from home as this works better-- not on formulary here. Continue Flonase--not reordered, defer to primary since he's also on decadron   13: BP goals: SBP between 120-140 to allow for perfusion. If above 140, monitor and do not give PRNs per NS  -01/07/23 BPs mostly at goal, monitor closely  -01/08/23 BPs at goal since last night, monitor  5/13: Diastolic HTN; decreased steroids today, monitor  5/14: MUCH better with steroid taper; monitor  Vitals:   01/06/23 1500 01/06/23 1933  01/07/23 0256 01/07/23 1608  BP: (!) 149/92 128/87 (!) 145/94 133/81   01/07/23 2034 01/08/23 0518 01/08/23 1338 01/08/23 1933  BP: 139/89 (!) 130/91 (!) 142/100 (!) 147/96   01/09/23 0435 01/09/23 1326 01/09/23 2059 01/10/23 0408  BP: 130/86 (!) 136/90 (!) 136/91 124/86      14. Constipation: Has resolved with use of Sennakot 2 tabs BID and Miralax 17g BID, monitor for oversoftening  - 5.13: LBM 5/12   15. GI prophylaxis: continue Protonix 40mg  QD  16. Sensation of tongue swelling/dysarthria - V2/3 sensory deficits and tongue deviation on exam; discussed likely d/t sensory changes and biting tongue; patient agreeing. Monitor,      LOS: 4 days A FACE TO FACE EVALUATION WAS PERFORMED  Angelina Sheriff 01/10/2023, 9:42 AM

## 2023-01-10 NOTE — Progress Notes (Signed)
Met with patient, was sitting up in recliner. Informed of conference today and SW will follow up. Educational binger was beside him, said that has been reading the information. No questions at this time. I did hand him a headache chart for him to use. [name] that has a cough but all xrays and test have been negative. Reports that is able to cough up phlegm at times.  Encourage use of flutter valve but this will increase cough which is where his pain comes from.  Asked if could take xyzal from home that Claritin really isn't doing anything for him. Also not sleeping very well, would like to Hydroxyzine. Also reports that tongue feels swollen and has since his surgery. [name] that is affecting his speech and ability to swallow and chew food.  Will address with MD. All needs met, call bell in reach.

## 2023-01-10 NOTE — Progress Notes (Signed)
Patient ID: Shawn York, male   DOB: [birth year], 47 y.o.   MRN: [id number]  SW met with pt in room to provide updates from team conference, and d/c date 5/22. Pt aware SW will follow-up with his wife.   45- SW spoke with pt wife Shawn York to inform on above. Fam edu scheduled for Monday 8am-10am. Will follow-up if she would like SW to order TTB. Outpatient referral will be sent to [facility] location once confirming if SLP is needed.   [name], MSW, LCSWA Office: [phone] Cell: [phone] Fax: [phone]

## 2023-01-11 DIAGNOSIS — F418 Other specified anxiety disorders: Secondary | ICD-10-CM

## 2023-01-11 DIAGNOSIS — R2689 Other abnormalities of gait and mobility: Secondary | ICD-10-CM

## 2023-01-11 DIAGNOSIS — F4310 Post-traumatic stress disorder, unspecified: Secondary | ICD-10-CM | POA: Diagnosis not present

## 2023-01-11 DIAGNOSIS — C719 Malignant neoplasm of brain, unspecified: Secondary | ICD-10-CM | POA: Diagnosis not present

## 2023-01-11 LAB — BASIC METABOLIC PANEL
Anion gap: 10 (ref 5–15)
BUN: 39 mg/dL — ABNORMAL HIGH (ref 6–20)
CO2: 23 mmol/L (ref 22–32)
Calcium: 9.2 mg/dL (ref 8.9–10.3)
Chloride: 101 mmol/L (ref 98–111)
Creatinine, Ser: 1.21 mg/dL (ref 0.61–1.24)
GFR, Estimated: >60 mL/min (ref >60)
Glucose, Bld: 102 mg/dL — ABNORMAL HIGH (ref 70–99)
Potassium: 3.9 mmol/L (ref 3.5–5.1)
Sodium: 134 mmol/L — ABNORMAL LOW (ref 135–145)

## 2023-01-11 NOTE — Evaluation (Signed)
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   Depression     Epistaxis     Headache     Hypertension     PTSD (post-traumatic stress disorder)     Seizures (HCC)      Past Surgical History:       Past Surgical History:  Procedure Laterality Date   BASAL CELL CARCINOMA EXCISION   1995   CRANIOTOMY FOR TUMOR Right 02/25/2011   IR ANGIO EXTERNAL CAROTID SEL EXT CAROTID BILAT MOD SED   11/16/2021   IR ANGIO INTRA EXTRACRAN SEL COM CAROTID INNOMINATE UNI R MOD SED   11/16/2021   IR ANGIO INTRA EXTRACRAN SEL INTERNAL CAROTID UNI L MOD SED   11/16/2021   IR ANGIO VERTEBRAL SEL VERTEBRAL UNI R MOD SED   11/16/2021   IR ANGIOGRAM FOLLOW UP STUDY   11/17/2021   IR NEURO EACH ADD'L AFTER BASIC UNI LEFT (MS)   11/17/2021   IR NEURO EACH ADD'L AFTER BASIC UNI RIGHT (MS)   11/17/2021   IR TRANSCATH/EMBOLIZ   11/16/2021   IR US GUIDE VASC ACCESS RIGHT   11/16/2021   RADIOLOGY WITH ANESTHESIA N/A 11/16/2021    Procedure: IR WITH ANESTHESIA;  Surgeon: Baldemar Lenis, MD;  Location: [facility] OR;  Service: Radiology;  Laterality: N/A;   TOTAL HIP ARTHROPLASTY Right 12/21/2021    Procedure: RIGHT TOTAL HIP REPLACEMENT;  Surgeon: Tarry Kos, MD;  Location: [facility] OR;  Service: Orthopedics;  Laterality: Right;  3-C   WISDOM TOOTH EXTRACTION          Assessment & Plan Clinical Impression: Shawn York is a 47 year old male with history of AVN of b-hips s/p R-THR, HTN, PTSD, depression w/anxiety, ascending aortic aneurysm, 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 Adline Peals with 70% removal of tumor. Metronomic Temodar initiated. He completed standard radiation with concurrent temozolomide and Avastin in 2013. MRI on 11/09/2022 suspicious for progression and repeat MRI done 4 weeks later found to have FLAIR lesion. He underwent repeat right temporal craniotomy for resection of tumor on 12/29/2022 by Dr. Lavonna Monarch at [facility]. This was complicated by bleeding from a branch of the MCA and post-op motor deficits of left side and left facial droop. Follow up MRI showed subjacent extra axial hemorrhage with layering blood products in right temporal lobe resection cavity with small volume SAH and trace cytotoxic edema in resection cavity. He was loaded with Vimpat and has been transitioned back to home Trileptal.    Hyponatremia with sodium down to 129 managed with 1 L fluid restriction, hypertonic saline, Gatorade, salt tabs and Ura NA with resolution Low phosphorus levels normalized from 2.2 to 3.7. ABLA improving with Hgb up to 12.4 and platelets stable at 243. He is tolerating regular diet with thin liquids but reporting chronic cough which is ongoing as well as persistent HA. On Lyrica for HA and doxycycline x 12 days added on 05/09 for HEENT/respiratory symptoms?. He continues on decadron 4  mg BID with taper to start in 4 days. He continues on trileptal, Rexulti and buspar for management of anxiety/depression. Patient with resultant left sided weakness with sensory changes, left inattention, headaches as well as decreased insight into deficits.  The patient requires inpatient medicine and rehabilitation evaluations and services for ongoing dysfunction secondary to right temporal craniotomy for recurrent anaplastic oligodendroglioma with decline.  Pt presents with decreased activity tolerance, decreased functional mobility, decreased balance, decreased coordination, feelings of stress Limiting pt's independence with  leisure/community pursuits.  Pt referred for community reintegration by team.  Discussed purpose of an outing and potential goals.  Pt agreeable to participate in an outing today to [employer].  Plan  Min 1 time for community reintegration >60 minutes  Recommendations for other services: None   Discharge Criteria: Patient will be discharged from TR if patient refuses treatment 3 consecutive times without medical reason.  If treatment goals not met, if there is a change in medical status, if patient makes no progress towards goals or if patient is discharged from hospital.  The above assessment, treatment plan, treatment alternatives and goals were discussed and mutually agreed upon: by patient  SESSION NOTE: Pain:  no c/o Time:10:30-12  Goal:  Pt will participate in community outing with min assist ambulatory level.  MET  Pt participated in Community reintegration/outing to [employer] (place of employment) at overall contact guard assist ambulatory  level.  Goals focused on safe community mobility, identification & negotiation of obstacles, energy conservation techniques/education.  Pt somewhat emotional throughout outing as coworkers greeted & encouraged him.  See outing goal sheet in shadow chart for full details.   Recreational Therapy Discharge Summary Patient Details  Name: Shawn York MRN: [id number] Date of Birth: 06-29-1976 Today's Date: 01/12/2023  Comments on progress toward goals: Pt referred and participated in community reintegration at contact guard assist level as well as animal assisted therapy during LOS.  Goal met.   Pt is scheduled for discharge home with wife and family to provide the needed support on 5/22.  Reasons for discharge: treatment goals met  Follow-up: Outpatient  Patient/family agrees with progress made and goals achieved: Yes  Ziyanna Tolin 01/12/2023, 4:02 PM      Nyja Westbrook 01/11/2023, 3:29 PM

## 2023-01-11 NOTE — Progress Notes (Signed)
Patient ID: Shawn York, male   DOB: 07/06/1976, 47 y.o.   MRN: [id number]  SW faxed outpatient PT/OT/SLP referral to [facility] location (p:[phone]/f:[phone]).  [name], MSW, LCSWA Office: [phone] Cell: [phone] Fax: [phone]

## 2023-01-11 NOTE — Consult Note (Signed)
Neuropsychological Consultation Comprehensive Inpatient Rehab   Patient:   Shawn York   DOB:   May 21, 1976  MR Number:  [id number]  Location:  [facility] [address] CENTER A [id number] [place] [address] Dept: [phone] Loc: [phone]           Date of Service:   01/11/2024  Start Time:   3 PM End Time:   4 PM  Provider/Observer:  Arley Phenix, Psy.D.       Clinical Neuropsychologist       Billing Code/Service: [phone]  Reason for Service:    Shawn York is a 47 year old male referred for neuropsychological consultation during his current admission onto the comprehensive inpatient rehabilitation unit due to ongoing psychological/psychiatric issues and status post right temporal craniotomy.  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  Patient completed standard radiation in 2013.  Patient has a significant past psychiatric history that predated the identification of this tumor.  Patient has a history of significant posttraumatic stress disorder, depression with anxiety that have been managed through psychiatry for some time.  Patient continues on his current psychotropic regimen during his hospitalization.  This includes Trileptal, Rexulti and BuSpar.  Recent repeat MRIs have been contacted since that time and an MRI on 11/09/2022 indicated suspicion of progression and repeat MRI was done 4 weeks later with clear indication of progression.  Patient underwent right temporal craniotomy with resection of tumor on 12/29/2022 at [facility] with complications of bleeding from branch of the MCA.  Patient has persistent postoperative motor deficits on left side and left facial droop and near complete paralysis of his left hand.  Follow-up MRI did show  adjacent extra-axial hemorrhage with layering blood products in the right temporal lobe resection cavity with small volume subarachnoid hemorrhage and trace cryptic tonic edema and resection cavity.  Patient was admitted to the comprehensive inpatient rehabilitation unit due to left-sided weakness with sensory changes, left inattention, headaches as well as decreased insight into deficits.  During the visit today, the patient's affect was rather odd and extremely flat.  As this is the first time I have ever met the patient is hard to get a gauge as to how different his current presentation is to his standard affect and mood state.  The patient reported the only change post surgery was the significant motor deficits but it was clear he had significant attention issues.  It was difficult to assess his cognitive status.  The patient had a video playing on his phone when I came into the room.  The body was very loud.  When asked if we could turn it off per our discussion the patient reported that he was watching that and did not want to turn it off.  He did have a conversation with me but never acknowledged or realized the impact it was having on our discussion and even with several request he continued to leave the video plane.  Patient acknowledged a longstanding history of anxiety and depression and posttraumatic stress disorder but showed had great hesitancy to discuss anything about prior traumatic experiences.  Rapport was extremely difficult to establish in the patient's affect and mood state stayed flat and constant throughout our visit.  He only answered specific and direct questions without any elaboration.  When given multiple opportunities to ask questions himself his  reply each time was that he felt he understood what was happening and had been told everything he needed to know.  HPI for the current admission:    HPI: Shawn York is a 47 year old male with history of AVN of b-hips s/p R-THR, HTN,  PTSD, depression w/anxiety, ascending aortic aneurysm, 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 Adline Peals with 70% removal of tumor. Metronomic Temodar initiated. He completed standard radiation with concurrent temozolomide and Avastin in 2013. MRI on 11/09/2022 suspicious for progression and repeat MRI done 4 weeks later found to have FLAIR lesion. He underwent repeat right temporal craniotomy for resection of tumor on 12/29/2022 by Dr. Lavonna Monarch at [facility]. This was complicated by bleeding from a branch of the MCA and post-op motor deficits of left side and left facial droop. Follow up MRI showed subjacent extra axial hemorrhage with layering blood products in right temporal lobe resection cavity with small volume SAH and trace cytotoxic edema in resection cavity. He was loaded with Vimpat and has been transitioned back to home Trileptal.    Hyponatremia with sodium down to 129 managed with 1 L fluid restriction, hypertonic saline, Gatorade, salt tabs and Ura NA with resolution Low phosphorus levels normalized from 2.2 to 3.7. ABLA improving with Hgb up to 12.4 and platelets stable at 243. He is tolerating regular diet with thin liquids but reporting chronic cough which is ongoing as well as persistent HA. On Lyrica for HA and doxycycline x 12 days added on 05/09 for HEENT/respiratory symptoms?. He continues on decadron 4 mg BID with taper to start in 4 days. He continues on trileptal, Rexulti and buspar for management of anxiety/depression. Patient with resultant left sided weakness with sensory changes, left inattention, headaches as well as decreased insight into deficits.  The patient requires inpatient medicine and rehabilitation evaluations and services for ongoing dysfunction secondary to right temporal craniotomy for recurrent anaplastic oligodendroglioma with decline in his overall functional status.   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/02/2022   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) 11/02/2022   Abnormal finding on MRI of brain 05/30/2022   Aneurysm of ascending aorta without rupture (HCC) 02/28/2022   Anemia due to acquired thiamine deficiency 12/01/2021   Seasonal allergic rhinitis due to pollen 11/08/2021   Family history of dissecting aortic aneurysm 11/08/2021   Contrast media allergy 11/08/2021   Avascular necrosis of bone of right hip (HCC) 11/02/2021   Avascular necrosis of bone of left hip (HCC) 11/02/2021   Primary hypertension 05/17/2021   Encounter for general adult medical examination with abnormal findings 05/17/2021   Posttraumatic stress disorder 06/05/2018   Nightmares associated with chronic post-traumatic stress disorder 09/27/2017   Brain tumor, glioma (HCC) 09/27/2017   Severe episode of recurrent major depressive disorder, without psychotic features (HCC)    Major depressive  disorder, recurrent episode (HCC) 08/17/2016   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    Behavioral Observation/Mental Status:   Shawn York  presents as a 47 y.o.-year-old Right handed Caucasian Male who appeared his stated age. his dress was Appropriate and he was Well Groomed and his manners were Appropriate, inappropriate to the situation.  his participation was indicative of Inattentive and Resistant behaviors.  There were physical disabilities  noted.  he displayed an inappropriate level of cooperation and motivation.    Interactions:    Minimal Redirectable  Attention:   abnormal and attention span appeared shorter than expected for age  Memory:   abnormal; remote memory intact, recent memory impaired  Visuo-spatial:   not examined  Speech (Volume):  low  Speech:   normal; normal  Thought Process:  Circumstantial  Concrete and Circumstantial  Though Content:  WNL; not suicidal and not homicidal  Orientation:   person, place, and situation  Judgment:   Poor  Planning:   Poor  Affect:    Flat, Lethargic, and Resistant  Mood:    Dysphoric  Insight:   Fair  Intelligence:   normal  Psychiatric History:  Patient with significant cardiac history even before the 2012 brain cancer was noted with a history of posttraumatic stress disorder, depression and anxiety.  Patient continues on his home medication including Trileptal, Rexulti and BuSpar.  Abuse/Trauma History: Patient does have a history of posttraumatic stress disorder but was not able to or unwilling to address these particular factors directly.  Family Med/Psych History:  Family History  Problem Relation Age of Onset   Cancer Other        brain cancer   Cancer Father        skin cancer   Cancer Maternal Grandmother 6       brain cancer   Alcohol abuse Brother    Alzheimer's disease Paternal Grandmother    Dementia Paternal Grandmother    Heart disease Paternal Grandfather     Impression/DX:   Shawn York is a 47 year old male referred for neuropsychological consultation during his current admission onto the comprehensive inpatient rehabilitation unit due to ongoing psychological/psychiatric issues and status post right temporal craniotomy.  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  Patient  completed standard radiation in 2013.  Patient has a significant past psychiatric history that predated the identification of this tumor.  Patient has a history of significant posttraumatic stress disorder, depression with anxiety that have been managed through psychiatry for some time.  Patient continues on his current psychotropic regimen during his hospitalization.  This includes Trileptal, Rexulti and BuSpar.  Recent repeat MRIs have been contacted since that time and an MRI on 11/09/2022 indicated suspicion of progression and repeat MRI was done 4 weeks later with clear indication of progression.  Patient underwent right temporal craniotomy with resection of tumor on 12/29/2022 at [facility] with complications of bleeding from branch of the MCA.  Patient has persistent postoperative motor deficits on left side and left facial droop and near complete paralysis of his left hand.  Follow-up MRI did show adjacent extra-axial hemorrhage with layering blood products in the right temporal lobe resection cavity with small volume subarachnoid hemorrhage and trace cryptic tonic edema and resection cavity.  Patient was admitted to the comprehensive inpatient rehabilitation unit due to left-sided weakness with sensory changes, left inattention, headaches as  well as decreased insight into deficits.  During the visit today, the patient's affect was rather odd and extremely flat.  As this is the first time I have ever met the patient is hard to get a gauge as to how different his current presentation is to his standard affect and mood state.  The patient reported the only change post surgery was the significant motor deficits but it was clear he had significant attention issues.  It was difficult to assess his cognitive status.  The patient had a video playing on his phone when I came into the room.  The body was very loud.  When asked if we could turn it off per our discussion the patient reported that he was  watching that and did not want to turn it off.  He did have a conversation with me but never acknowledged or realized the impact it was having on our discussion and even with several request he continued to leave the video plane.  Patient acknowledged a longstanding history of anxiety and depression and posttraumatic stress disorder but showed had great hesitancy to discuss anything about prior traumatic experiences.  Rapport was extremely difficult to establish in the patient's affect and mood state stayed flat and constant throughout our visit.  He only answered specific and direct questions without any elaboration.  When given multiple opportunities to ask questions himself his reply each time was that he felt he understood what was happening and had been told everything he needed to know.  Disposition/Plan:  Today there was an attempt to work on coping and adjustment issues but the patient was very hesitant for at least very uninterested in engaging any in-depth discussions and remain very concrete in her interactions.  As I have not interacted with Shawn York previously it is difficult to judge how much of this is longstanding personality style and behavioral style versus effects of either the past 2012 or more recent 2024 resection, treatment of his brain tumor or impact of radiation or impact of his MCA hemorrhage.          Electronically Signed   _______________________ Arley Phenix, Psy.D. Clinical Neuropsychologist

## 2023-01-11 NOTE — Progress Notes (Signed)
Recreational Therapy Session Note  Patient Details  Name: Daruis Raudabaugh MRN: [id number] Date of Birth: Oct 13, 1975 Today's Date: 01/11/2023  Pain:no c/o  Pt participated in animal assisted activity seated in recliner with supervision.  Pt easily engaged with pet partner team, sharing that he had 2 dogs.  Pt tearful during this visit but so appreciative.      Dorian Duval 01/11/2023, 3:26 PM

## 2023-01-11 NOTE — Plan of Care (Signed)
  Problem: RH Memory Goal: LTG Patient will demonstrate ability for day to day (SLP) Description: LTG:   Patient will demonstrate ability for day to day recall/carryover during cognitive/linguistic activities with assist  (SLP) Flowsheets (Taken 01/11/2023 1424) LTG: Patient will demonstrate ability for day to day recall: Daily complex information LTG: Patient will demonstrate ability for day to day recall/carryover during cognitive/linguistic activities with assist (SLP): Modified Independent Goal: LTG Patient will use memory compensatory aids to (SLP) Description: LTG:  Patient will use memory compensatory aids to recall biographical/new, daily complex information with cues (SLP) Flowsheets (Taken 01/11/2023 1424) LTG: Patient will use memory compensatory aids to (SLP): Modified Independent

## 2023-01-11 NOTE — Progress Notes (Signed)
PROGRESS NOTE   Subjective/Complaints: No acute complaints. No events overnight. Family brought in Xyzal yesterday, will start today. Ongoiong cough with HA.    ROS:  +cough - ongoing + HA-  ongoing Denies fevers, chills, CP, SOB, abd pain, N/V/D/C, or any other complaints at this time.    Objective:   DG Chest Port 1 View  Result Date: 01/09/2023 CLINICAL DATA:  Cough EXAM: PORTABLE CHEST 1 VIEW COMPARISON:  01/06/2023 FINDINGS: The heart size and mediastinal contours are within normal limits. Both lungs are clear. The visualized skeletal structures are unremarkable. Postsurgical changes in the cervical spine are noted. IMPRESSION: No active disease. Electronically Signed   By: Alcide Clever M.D.   On: 01/09/2023 19:15   Recent Labs    01/09/23 0904  WBC 12.2*  HGB 11.5*  HCT 33.2*  PLT 206    Recent Labs    01/10/23 0642 01/11/23 0759  NA 131* 134*  K 3.9 3.9  CL 96* 101  CO2 28 23  GLUCOSE 107* 102*  BUN 35* 39*  CREATININE 0.95 1.21  CALCIUM 9.0 9.2    Intake/Output Summary (Last 24 hours) at 01/11/2023 0830 Last data filed at 01/11/2023 0143 Gross per 24 hour  Intake 218 ml  Output 800 ml  Net -582 ml         Physical Exam: Vital Signs Blood pressure 127/84, pulse 67, temperature 98.1 F (36.7 C), temperature source Oral, resp. rate 18, height 6\' 1"  (1.854 m), weight 102.3 kg, SpO2 100 %.  Constitutional: No apparent distress. Appropriate appearance for age. Sitting in bed, relaxed.  HENT: No JVD. Neck Supple. Trachea midline. + R craniotomy, Healed scar to anterior R neck from prior ACDF. Normal appearing tongue, no swelling or erythema.  Eyes: PERRLA. EOMI. Visual fields grossly intact.  Cardiovascular: RRR, no murmurs/rub/gallops. No Edema. Peripheral pulses 2+  Respiratory: CTAB. No rales, rhonchi, or wheezing. On RA. +dry cough-ongoing Abdomen: + bowel sounds, normoactive. No distention or  tenderness.  Skin: C/D/I. No apparent lesions aside from R craniotomy wound MSK:      No apparent deformity.       Strength:                RUE: 5/5 SA, 5/5 EF, 5/5 EE, 5/5 WE, 5/5 FF, 5/5 FA                 LUE: 4/5 throughout                RLE: 5/5 HF, 5/5 KE, 5/5 DF, 5/5 EHL, 5/5 PF                 LLE:  5-/5 HF, 5-/5 KE, 5/5 DF, 5/5 EHL, 5/5 PF   Neurologic exam: +HA with extreme lateral gaze bilaterally -not noted today Cognition: AAO to person, place, time and event.  + Significant motor delay in LUE > LLE - improving Language: Fluent, No substitutions or neoglisms. No dysarthria. Names 3/3 objects correctly.  Memory: Recalls 3/3 objects at 5 minutes. No apparent deficits  Insight: Good  insight into current condition.  Mood: Pleasant affect, appropriate mood.  Sensation: To light touch intact in  BL UEs and LEs  Reflexes: 2+ in BL UE and LEs. + L Hoffman's  CN: + L facial droop + L  V2/3 sensory loss + L tongue deviation Coordination: Fine motor difficulty LUE Spasticity: MAS 0 in all extremities.   Assessment/Plan: 1. Functional deficits which require 3+ hours per day of interdisciplinary therapy in a comprehensive inpatient rehab setting. Physiatrist is providing close team supervision and 24 hour management of active medical problems listed below. Physiatrist and rehab team continue to assess barriers to discharge/monitor patient progress toward functional and medical goals  Care Tool:  Bathing    Body parts bathed by patient: Right arm, Left arm, Chest, Abdomen, Front perineal area, Buttocks, Right upper leg, Right lower leg, Left upper leg, Left lower leg, Face         Bathing assist Assist Level: Minimal Assistance - Patient > 75%     Upper Body Dressing/Undressing Upper body dressing   What is the patient wearing?: Pull over shirt    Upper body assist Assist Level: Moderate Assistance - Patient 50 - 74%    Lower Body Dressing/Undressing Lower body  dressing      What is the patient wearing?: Pants     Lower body assist Assist for lower body dressing: Moderate Assistance - Patient 50 - 74%     Toileting Toileting    Toileting assist Assist for toileting: Minimal Assistance - Patient > 75%     Transfers Chair/bed transfer  Transfers assist     Chair/bed transfer assist level: Minimal Assistance - Patient > 75%     Locomotion Ambulation   Ambulation assist      Assist level: Minimal Assistance - Patient > 75% Assistive device: No Device Max distance: 200+   Walk 10 feet activity   Assist     Assist level: Minimal Assistance - Patient > 75% Assistive device: No Device   Walk 50 feet activity   Assist    Assist level: Minimal Assistance - Patient > 75% Assistive device: No Device    Walk 150 feet activity   Assist    Assist level: Minimal Assistance - Patient > 75% Assistive device: No Device    Walk 10 feet on uneven surface  activity   Assist     Assist level: Minimal Assistance - Patient > 75% Assistive device: Other (comment) (No AD)   Wheelchair     Assist Is the patient using a wheelchair?: No             Wheelchair 50 feet with 2 turns activity    Assist            Wheelchair 150 feet activity     Assist          Blood pressure 127/84, pulse 67, temperature 98.1 F (36.7 C), temperature source Oral, resp. rate 18, height 6\' 1"  (1.854 m), weight 102.3 kg, SpO2 100 %.  Medical Problem List and Plan: 1. 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 Lavonna Monarch, c/b MCA bleeding with [facility]             -patient may shower             -ELOS/Goals: 10-14 days, supervision to Mod I PT/OT; DC date 5/22   -Continue CIR   - started e-stim LUE 5/14  - 5/14: Per teams, CGA-Min A transfers and ADLs. Gets overwhelmed and frustrated with L hand functional tasks. Similar  for PT. Doing  very well overall, good motivation and family support. Needs tub bench at DC.    2.  Antithrombotics: -DVT/anticoagulation:  Pharmaceutical: Heparin 5000U q8h             -antiplatelet therapy: none   3. Pain Management: Tylenol and Robaxin as needed; oxycodone 10 mg q 4 hours prn             --Lyrica 100 mg TID for HA   - 5/14: Schedule Tylenol 1000 mg TID; decrease oxycodone to 5 mg Q6H PRN   - 5/15: monnitor HA with xyzal as below   4. Mood/Behavior/Sleep: LCSW to evaluate and provide emotional support. History of depressed mood, anxiety, PTSD             -antipsychotic agents: continue Rexulti 2 mg daily             -continue Buspar 30 mg BID  -Melatonin 3mg  QHS   5. Neuropsych/cognition: This patient is capable of making decisions on his own behalf.   6. Skin/Wound Care: Routine skin care checks             -monitor surgical incision (POD #8 on 5/10)   7. Fluids/Electrolytes/Nutrition: Routine Is and Os. Check CMET in am and daily BMET for 5-7 days.  -01/07/23 Na 133 (see #9), BUN marginally elevated at 24 but Cr WNL, BMP otherwise fairly unremarkable; monitor labs   -01/08/23 BMP stable from yesterday, see below for Na/AKI discussion Recent Labs    01/09/23 1629 01/09/23 2124 01/10/23 0603  GLUCAP 131* 111* 103*    -5/13: Blood sugars well-controlled despite steroid taper, discontinue CBGs  8: Seizure prophylaxis: Continue Trileptal 300 mg BID             --has stable auras w/epigastric rising and mild dissociation.    9: Hyponatremia: To Continue Ure NA for 10 days (end 01/16/23)  --Continue 1 Liter fluid restriction with Gatorade prn -01/07/23 Na 133, monitor on labs -01/08/23 Na slightly lower at 131, cont fluid restriction and regimen for now, monitor on daily labs   5/13: NA continues to downtrend, nephrology consulted, appreciate recommendations; increase Urease and stop Gatorade accommodations for fluid restriction per their recommendations.  Daily BMPs for  follow-up.  5/14: NA back to 131, continue nephrology recommendations  5.15: Labs pending this AM. per nephro, when Na is up to 135 can decrease UreNa back to 15 g BID and resume taper as written in d/c instructions from [facility] (15 g BID x 10 days then off)   10: Post-op craniotomy: continue steroid taper: -Decadron 4 mg for 4 days, 2 mg BID for two days and then 2 mg once daily for one day --appt 05/15 10 am for Rad MRI [phone] and Dr. Burtis Junes office @ 3 pm at cancer center [phone]. -Discussing with social work regarding transportation versus rescheduling     11: Persistent Cough: On Doxycycline X 12 days per recommendations. Tessalon ineffective.  -01/07/23 CXR unremarkable, monitor cough, cont Robitussin PRN  5/13: Persistent; recheck chest x-ray, add flutter valve for secretions 5/14: CXR clear, flutter valve worsening. Can bring in home Xyzal to swap out for Claritin. Added PRN inhaler 5/15: Xyzal started.    12: Seasonal allergies: continue loratadine--advised to bring Xyzal from home as this works better-- not on formulary here. Continue Flonase--not reordered, defer to primary since he's also on decadron   13: BP goals: SBP between 120-140 to allow for perfusion. If above 140, monitor and  do not give PRNs per NS  -01/07/23 BPs mostly at goal, monitor closely  -01/08/23 BPs at goal since last night, monitor  5/13: Diastolic HTN; decreased steroids today, monitor  5/14-15: MUCH better with steroid taper; monitor  Vitals:   01/07/23 1608 01/07/23 2034 01/08/23 0518 01/08/23 1338  BP: 133/81 139/89 (!) 130/91 (!) 142/100   01/08/23 1933 01/09/23 0435 01/09/23 1326 01/09/23 2059  BP: (!) 147/96 130/86 (!) 136/90 (!) 136/91   01/10/23 0408 01/10/23 1314 01/10/23 1945 01/11/23 0411  BP: 124/86 124/80 (!) 128/102 127/84      14. Constipation: Has resolved with use of Sennakot 2 tabs BID and Miralax 17g BID, monitor for oversoftening  - LBM 5/15, large   15. GI prophylaxis:  continue Protonix 40mg  QD  16. Sensation of tongue swelling/dysarthria - V2/3 sensory deficits and tongue deviation on exam; discussed likely d/t sensory changes and biting tongue; patient agreeing. Monitor, SLP eval pending.     LOS: 5 days A FACE TO FACE EVALUATION WAS PERFORMED  Angelina Sheriff 01/11/2023, 8:30 AM

## 2023-01-11 NOTE — Progress Notes (Signed)
Occupational Therapy Session Note  Patient Details  Name: Shawn York MRN: [id number] Date of Birth: 21-Aug-1976  Today's Date: 01/11/2023 OT Individual Time: 0700-0730 OT Individual Time Calculation (min): 30 min   Today's Date: 01/11/2023 OT Individual Time: 1005-1100 OT Individual Time Calculation (min): 55 min  Short Term Goals: Week 1:  OT Short Term Goal 1 (Week 1): Pt will groom in standing with CGA OT Short Term Goal 2 (Week 1): Pt will don shirt wiht MIN A OT Short Term Goal 3 (Week 1): PT will complete TTB wiht CGA OT Short Term Goal 4 (Week 1): Pt will demo active digit extension to demo improved NMR  Skilled Therapeutic Interventions/Progress Updates:    Session 1: pt recived in bed with no pain reported, but requesting to cut hair. Pt completes transfer to chair at sink with CGA. OT trims hair for safety total A d/t staples. Pt able to trim beard and groom with set up. Pt returns to bed with exit alarm on and call light in reach. Plan to shower second session. Saebo stim put on wrist extensors.  Saebo Stim One 330 pulse width 35 Hz pulse rate On 8 sec/ off 8 sec Ramp up/ down 2 sec Symmetrical Biphasic wave form  Max intensity at 500 Ohm load  Session 2: Pt received in EOB agreeable to shower. Pt completes overall supervision-CGA for mobility with no AD. Pt needs min cuing for safety awareness during doffling clothing with questions to realize safest to sit to doff clothing. Pt bathes and dresses sit to stand level from TTB and toilet in bathroom. Pt then taken to tx gym with focus on grasp/release and functional reach. Edu re decreasing shoulder hike with reaching and pt able ot carry over. Pt uses BITS tough screen with avg 4.4 sec reaction time to grossly tough stimuli with LUE. Functional reach at tabletop for large foam leggos with improved MCP/thumb extension but limited PIP/DIP extension. Exited session with pt seated in recliner, exit alarm on and call light in  reach   Therapy Documentation Precautions:  Precautions Precautions: Fall Precaution Comments: L hemi, R crani Restrictions Weight Bearing Restrictions: No General:   Vital Signs: Therapy Vitals Temp: 98.1 F (36.7 C) Temp Source: Oral Pulse Rate: 67 Resp: 18 BP: 127/84 Patient Position (if appropriate): Sitting Oxygen Therapy SpO2: 100 % O2 Device: Room Air  Therapy/Group: Individual Therapy  [name] 01/11/2023, 6:58 AM

## 2023-01-11 NOTE — Progress Notes (Signed)
Physical Therapy Session Note  Patient Details  Name: Shawn York MRN: [id number] Date of Birth: Mar 15, 1976  Today's Date: 01/11/2023 PT Individual Time: 1302-1359 PT Individual Time Calculation (min): 57 min   Short Term Goals: Week 1:  PT Short Term Goal 1 (Week 1): Patient will complete bed mobility with Supv PT Short Term Goal 2 (Week 1): Patient will ambulate >200' with CGA PT Short Term Goal 3 (Week 1): Patient will ascend/descend 12 steps with CGA  Skilled Therapeutic Interventions/Progress Updates:     Pt received seated in recliner and agrees to therapy. Reports headache. PT provides rest breaks as needed to manage pain. Pt performs sit to stand with cues for initiation, then ambulates x150' to gym with CGA and cues for midline orientation due to slight L sided bias, and ensuring that pt adequately lifts L foot during stride length. Pt attempts to perform large peg board activity with L upper extremity but is unable to perform adequate finger extension to complete, so activity deferred for now. Pt instead performs toe taps on green cone. Pt demonstrates improved balance with activity, requiring occasional minA, but with fewer LOBs than previous attempt at same activity.  Following rest break, pt completes obstacle course, tasked with stepping over hurdles and weaving in and our of cones.   Pt performs forward and backward ambulation to challenge dynamic standing balance, requiring CGA/minA, with cues for upright gaze to improve balance. Pt then completes side stepping to the R and L, with cues for body mechanics and ensuring that pt lifts L foot rather than dragging across floor. Activity progressed by adding green theraband to provide resistance. Pt then completes 3x35' side steps with emphasis on body mechanics and maintaining consistent tension on theraband to provide loading through hip abductors and NM feedback. PT provides verbal and tactile cues for optimal body mechanics and  posture. Seated rest breaks between each bout.   Pt ambulates back to room, voids bladders, and transfers to bed with CGA and cues for positioning. Left supine in bed with all needs within reach.   Therapy Documentation Precautions:  Precautions Precautions: Fall Precaution Comments: L hemi, R crani Restrictions Weight Bearing Restrictions: No   Therapy/Group: Individual Therapy  Beau Fanny, PT, DPT 01/11/2023, 3:45 PM

## 2023-01-11 NOTE — Progress Notes (Signed)
Called [facility] back as have not yet received call from Dr. Caleen Jobs office. Was informed that patient did cancel appt and next appt available is in July. Will have patient call for appropriate follow up after discharge. No info yet regarding MRI rescheduling.

## 2023-01-11 NOTE — Evaluation (Signed)
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   Depression    Epistaxis    Headache    Hypertension    PTSD (post-traumatic stress disorder)    Seizures (HCC)    Past Surgical History:  Past Surgical History:  Procedure Laterality Date   BASAL CELL CARCINOMA EXCISION  1995   CRANIOTOMY FOR TUMOR Right 02/25/2011   IR ANGIO EXTERNAL CAROTID SEL EXT CAROTID BILAT MOD SED  11/16/2021   IR ANGIO INTRA EXTRACRAN SEL COM CAROTID INNOMINATE UNI R MOD SED  11/16/2021   IR ANGIO INTRA EXTRACRAN SEL INTERNAL CAROTID UNI L MOD SED  11/16/2021   IR ANGIO VERTEBRAL SEL VERTEBRAL UNI R MOD SED  11/16/2021   IR ANGIOGRAM FOLLOW UP STUDY  11/17/2021   IR NEURO EACH ADD'L AFTER BASIC UNI LEFT (MS)  11/17/2021   IR NEURO EACH ADD'L AFTER BASIC UNI RIGHT (MS)  11/17/2021   IR TRANSCATH/EMBOLIZ  11/16/2021   IR US GUIDE VASC ACCESS RIGHT  11/16/2021   RADIOLOGY WITH ANESTHESIA N/A 11/16/2021   Procedure: IR WITH ANESTHESIA;  Surgeon: Baldemar Lenis, MD;  Location: [facility] OR;  Service: Radiology;  Laterality: N/A;   TOTAL HIP ARTHROPLASTY Right 12/21/2021   Procedure: RIGHT TOTAL HIP REPLACEMENT;  Surgeon: Tarry Kos, MD;  Location: [facility] OR;  Service: Orthopedics;  Laterality: Right;  3-C   WISDOM TOOTH EXTRACTION      Assessment / Plan / Recommendation Clinical Impression Patient is a 47 year old male with history of AVN of b-hips s/p R-THR, HTN, PTSD, depression w/anxiety, ascending  aortic aneurysm, 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 Adline Peals with 70% removal of tumor. Metronomic Temodar initiated. He completed standard radiation with concurrent temozolomide and Avastin in 2013. MRI on 11/09/2022 suspicious for progression and repeat MRI done 4 weeks later found to have FLAIR lesion. He underwent repeat right temporal craniotomy for resection of tumor on 12/29/2022 by Dr. Lavonna Monarch at [facility]. This was complicated by bleeding from a branch of the MCA and post-op motor deficits of left side and left facial droop. Follow up MRI showed subjacent extra axial hemorrhage with layering blood products in right temporal lobe resection cavity with small volume SAH and trace cytotoxic edema in resection cavity. Therapy evaluations completed with recommendations for CIR. Patient admitted 01/06/23. SLP consult today due to reports of cognitive changes and biting is tongue when eating.   SLP administered subtests of the Cognitive Linguistic Quick Test (CLQT) with patient demonstrating mild impairments in memory. However, patient recalled functional and pertinent information regarding his current hospitalization and medications. Patient also reports cognitive fatigue throughout the day. Patient reports that he feels his speech is slurred and that his vocal quality has changed. Educated patient that although he may feel that his speech is slurred he has appropriate strength of oral motor musculature and is 100% intelligible. Also suspect the change in his vocal quality could be due to his  allergies resulting in constant coughing/throat clearing with the physician adjusting his allergy medication.  Patient observed with regular textures and thin liquids. Patient with reports of impaired sensation resulting in patient being more deliberate with his mastication. Patient without anterior spillage and demonstrated efficient  mastication with complete oral clearance without overt s/s of aspiration.  Recommend patient continue current diet of regular textures with thin liquids. Patient would benefit from skilled SLP intervention to maximize his cognitive functioning and overall functional independence prior to discharge.    Skilled Therapeutic Interventions          Administered a BSE and cognitive-linguistic evaluation, please see above for details.   SLP Assessment  Patient will need skilled Speech Lanaguage Pathology Services during CIR admission    Recommendations  SLP Diet Recommendations: Age appropriate regular solids;Thin Liquid Administration via: Straw Medication Administration: Whole meds with liquid Supervision: Patient able to self feed Postural Changes and/or Swallow Maneuvers: Seated upright 90 degrees Oral Care Recommendations: Oral care BID Recommendations for Other Services: Neuropsych consult Patient destination: Home Follow up Recommendations: None Equipment Recommended: None recommended by SLP    SLP Frequency 1 to 3 out of 7 days   SLP Duration  SLP Intensity  SLP Treatment/Interventions 7 days  Minumum of 1-2 x/day, 30 to 90 minutes  Cognitive remediation/compensation;Cueing hierarchy;Environmental controls;Functional tasks;Patient/family education;Therapeutic Activities    Pain Pain Assessment Pain Scale: 0-10 Pain Score: 0-No pain   SLP Evaluation Cognition Overall Cognitive Status: Impaired/Different from baseline Arousal/Alertness: Awake/alert Orientation Level: Oriented X4 Memory: Impaired Memory Impairment: Decreased recall of new information Awareness: Appears intact Safety/Judgment: Appears intact  Comprehension Auditory Comprehension Overall Auditory Comprehension: Appears within functional limits for tasks assessed Visual Recognition/Discrimination Discrimination: Not tested Reading Comprehension Reading Status: Not tested Expression Expression Primary  Mode of Expression: Verbal Verbal Expression Overall Verbal Expression: Appears within functional limits for tasks assessed Written Expression Dominant Hand: Right Written Expression: Not tested Oral Motor Oral Motor/Sensory Function Overall Oral Motor/Sensory Function: Mild impairment Facial Strength: Reduced left Facial Sensation: Reduced left Lingual ROM: Within Functional Limits Lingual Symmetry: Abnormal symmetry left Lingual Strength: Within Functional Limits Lingual Sensation: Reduced Motor Speech Overall Motor Speech: Appears within functional limits for tasks assessed  Care Tool Care Tool Cognition Ability to hear (with hearing aid or hearing appliances if normally used Ability to hear (with hearing aid or hearing appliances if normally used): 0. Adequate - no difficulty in normal conservation, social interaction, listening to TV   Expression of Ideas and Wants Expression of Ideas and Wants: 4. Without difficulty (complex and basic) - expresses complex messages without difficulty and with speech that is clear and easy to understand   Understanding Verbal and Non-Verbal Content Understanding Verbal and Non-Verbal Content: 4. Understands (complex and basic) - clear comprehension without cues or repetitions  Memory/Recall Ability Memory/Recall Ability : Current season;That he or she is in a hospital/hospital unit    Bedside Swallowing Assessment General Date of Onset: 12/29/22 Previous Swallow Assessment: N/A Diet Prior to this Study: Regular;Thin liquids (Level 0) Temperature Spikes Noted: No Respiratory Status: Room air Behavior/Cognition: Alert;Cooperative Oral Cavity - Dentition: Adequate natural dentition Self-Feeding Abilities: Able to feed self Patient Positioning: Upright in bed Baseline Vocal Quality: Normal Volitional Cough: Strong Volitional Swallow: Able to elicit  Oral Care Assessment Oral Assessment  (WDL): Within Defined Limits Lips:  Asymmetrical Teeth: Intact Tongue: Pink;Moist Mucous Membrane(s): Pink;Moist Saliva: Moist, saliva free flowing Level of Consciousness: Alert Is patient on any of following O2 devices?: None of  the above Nutritional status: No high risk factors Oral Assessment Risk : Low Risk Ice Chips Ice chips: Not tested Thin Liquid Thin Liquid: Within functional limits Presentation: Straw Nectar Thick Nectar Thick Liquid: Not tested Honey Thick Honey Thick Liquid: Not tested Puree Puree: Not tested Solid Solid: Within functional limits Presentation: Self Fed BSE Assessment Risk for Aspiration Impact on safety and function: No limitations  Short Term Goals: Week 1: SLP Short Term Goal 1 (Week 1): STGs=LTGs due to ELOS  Refer to Care Plan for Long Term Goals  Recommendations for other services: Neuropsych  Discharge Criteria: Patient will be discharged from SLP if patient refuses treatment 3 consecutive times without medical reason, if treatment goals not met, if there is a change in medical status, if patient makes no progress towards goals or if patient is discharged from hospital.  The above assessment, treatment plan, treatment alternatives and goals were discussed and mutually agreed upon: by patient  Melanee Cordial 01/11/2023, 2:12 PM

## 2023-01-12 LAB — BASIC METABOLIC PANEL
Anion gap: 9 (ref 5–15)
BUN: 47 mg/dL — ABNORMAL HIGH (ref 6–20)
CO2: 24 mmol/L (ref 22–32)
Calcium: 8.8 mg/dL — ABNORMAL LOW (ref 8.9–10.3)
Chloride: 103 mmol/L (ref 98–111)
Creatinine, Ser: 1.02 mg/dL (ref 0.61–1.24)
GFR, Estimated: >60 mL/min (ref >60)
Glucose, Bld: 105 mg/dL — ABNORMAL HIGH (ref 70–99)
Potassium: 3.8 mmol/L (ref 3.5–5.1)
Sodium: 136 mmol/L (ref 135–145)

## 2023-01-12 MED ORDER — TRAZODONE HCL 50 MG PO TABS
50.0000 mg | ORAL_TABLET | Freq: Every evening | ORAL | Status: DC | PRN
  Administered 2023-01-12: 50 mg via ORAL
  Filled 2023-01-12: qty 1

## 2023-01-12 MED ORDER — DOXAZOSIN MESYLATE 2 MG PO TABS
4.0000 mg | ORAL_TABLET | Freq: Every day | ORAL | Status: DC
  Administered 2023-01-12 – 2023-01-17 (×6): 4 mg via ORAL
  Filled 2023-01-12 (×6): qty 2

## 2023-01-12 MED ORDER — FLUTICASONE PROPIONATE 50 MCG/ACT NA SUSP
1.0000 | Freq: Every day | NASAL | Status: DC
  Administered 2023-01-13 – 2023-01-18 (×6): 1 via NASAL
  Filled 2023-01-12: qty 16

## 2023-01-12 MED ORDER — BENZONATATE 100 MG PO CAPS
100.0000 mg | ORAL_CAPSULE | Freq: Two times a day (BID) | ORAL | Status: DC
  Administered 2023-01-12 – 2023-01-18 (×13): 100 mg via ORAL
  Filled 2023-01-12 (×13): qty 1

## 2023-01-12 MED ORDER — UREA 15 G PO PACK
15.0000 g | PACK | Freq: Two times a day (BID) | ORAL | Status: AC
  Administered 2023-01-12 – 2023-01-15 (×7): 15 g via ORAL
  Filled 2023-01-12 (×7): qty 1

## 2023-01-12 NOTE — Progress Notes (Deleted)
MRI called for patient, patient is off unit for outing with therapy at [employer]. Informed MRI of this. Was informed that patient will need IV access. Will consult IV team when patient is back on unit.

## 2023-01-12 NOTE — Discharge Summary (Signed)
Physician Discharge Summary  Patient ID: Shawn York MRN: [id number] DOB/AGE: 1975/11/11 47 y.o.  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[facility]) Active Problems:   Posttraumatic stress disorder   Depression with anxiety Functional deficits secondary to oligodendroglioma Persistent cough Seasonal allergies SIADH/hyponatremia Postoperative craniotomy Constipation V2/3 sensory deficits   Discharged Condition: stable  Significant Diagnostic Studies: Narrative & Impression  CLINICAL DATA:  Brain/CNS neoplasm, monitor follow up post oligodendroglioma resection   EXAM: MRI HEAD WITHOUT AND WITH CONTRAST   TECHNIQUE: Multiplanar, multiecho pulse sequences of the brain and surrounding structures were obtained without and with intravenous contrast.   CONTRAST:  9mL GADAVIST GADOBUTROL 1 MMOL/ML IV SOLN   COMPARISON:  Brain MR 12/12/22   FINDINGS: Brain: Interval postsurgical changes from right pterional craniotomy for resectionof reported right temporal oligodendroglioma. There is a 5 mm postoperative subdural hematoma along the right cerebral convexity. 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 There is also small volume subarachnoid hemorrhage along the right parietal and posterior frontal lobes (series 14, image 45). There is dural enhancement and thickening adjacent to the craniotomy site, favored to be postoperative. There is also dural enhancement and thickening along the left occipital convexity (series 11, image 13) and likely the left temporal convexity (series 11, image 10), which may be due to CSF hypotension.   Compared to the preoperative exam there is new T2/FLAIR hyperintense signal  abnormality in the corona radiata on the right with associated curvilinear contrast enhancement in these regions. This could potentially represent a region of subacute infarct. Previously seen nodular foci of contrast enhancement in the right temporal lobe are no longer visualized.   Vascular: Normal flow voids.   Skull and upper cervical spine: Status post right pterional craniotomy.   Sinuses/Orbits: No middle ear or mastoid effusion. Paranasal sinuses are notable for mild mucosal thickening of the floor of the right maxillary sinus. Orbits are unremarkable.   Other: None.   IMPRESSION: 1. 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 Previously seen nodular foci of contrast enhancement in the right temporal lobe are no longer visualized. 2. New T2/FLAIR hyperintense signal abnormality in the right corona radiata with associated curvilinear contrast enhancement in these regions. This could potentially represent a region of subacute infarct. Recommend attention on follow-up. 3. Small volume subarachnoid hemorrhage and postoperative subdural hematoma along the right cerebral convexity, as above. 4. 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 5. Dural enhancement and thickening along the left occipital convexity and likely the left temporal convexity, which may be due to CSF hypotension.     Electronically Signed   By: Lorenza Cambridge M.D.   On: 01/17/2023 09:25      MR BRAIN W WO CONTRAST  Result Date: 01/17/2023 CLINICAL DATA:  Brain/CNS neoplasm, monitor follow up post oligodendroglioma resection EXAM: MRI HEAD WITHOUT AND WITH CONTRAST TECHNIQUE: Multiplanar, multiecho pulse sequences of the brain and surrounding structures were obtained without and  with intravenous contrast. 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 There is a 5  mm postoperative subdural hematoma along the right cerebral convexity. 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 There is also small volume subarachnoid hemorrhage along the right parietal and posterior frontal lobes (series 14, image 45). There is dural enhancement and thickening adjacent to the craniotomy site, favored to be postoperative. There is also dural enhancement and thickening along the left occipital convexity (series 11, image 13) and likely the left temporal convexity (series 11, image 10), which may be due to CSF hypotension. Compared to the preoperative exam there is new T2/FLAIR hyperintense signal abnormality in the corona radiata on the right with associated curvilinear contrast enhancement in these regions. This could potentially represent a region of subacute infarct. Previously seen nodular foci of contrast enhancement in the right temporal lobe are no longer visualized. Vascular: Normal flow voids. Skull and upper cervical spine: Status post right pterional craniotomy. Sinuses/Orbits: No middle ear or mastoid effusion. Paranasal sinuses are notable for mild mucosal thickening of the floor of the right maxillary sinus. Orbits are unremarkable. Other: None. IMPRESSION: 1. 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 Previously seen nodular foci of contrast enhancement in the right temporal lobe are no longer visualized. 2. New T2/FLAIR hyperintense signal  abnormality in the right corona radiata with associated curvilinear contrast enhancement in these regions. This could potentially represent a region of subacute infarct. Recommend attention on follow-up. 3. Small volume subarachnoid hemorrhage and postoperative subdural hematoma along the right cerebral convexity, as above. 4. 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 5. Dural enhancement and thickening along the left occipital convexity and likely the left temporal convexity, which may be due to CSF hypotension. Electronically Signed   By: Lorenza Cambridge M.D.   On: 01/17/2023 09:25   DG Chest Port 1 View  Result Date: 01/09/2023 CLINICAL DATA:  Cough EXAM: PORTABLE CHEST 1 VIEW COMPARISON:  01/06/2023 FINDINGS: The heart size and mediastinal contours are within normal limits. Both lungs are clear. The visualized skeletal structures are unremarkable. Postsurgical changes in the cervical spine are noted. IMPRESSION: No active disease. Electronically Signed   By: Alcide Clever M.D.   On: 01/09/2023 19:15   DG Chest 2 View  Result Date: 01/06/2023 CLINICAL DATA:  Cough EXAM: CHEST - 2 VIEW COMPARISON:  02/14/2022 FINDINGS: Cardiac shadow is within normal limits. Lungs are well aerated bilaterally. No focal infiltrate or effusion is seen. Postsurgical changes in the cervical spine noted. IMPRESSION: No active cardiopulmonary disease. Electronically Signed   By: Alcide Clever M.D.   On: 01/06/2023 22:56    Labs:  Basic Metabolic Panel: Recent Labs  Lab 01/12/23 0718 01/13/23 0650 01/14/23 0615 01/16/23 0526 01/17/23 0558  NA 136 138 135 137 140  K 3.8 4.0 3.9 3.9 4.0  CL 103 103 103 106 108  CO2 24 25 24 23 25   GLUCOSE 105* 94 91 102* 101*  BUN 47* 41* 37* 33* 22*  CREATININE 1.02 0.94 0.96 0.88 0.91  CALCIUM 8.8* 9.0 8.8* 8.6* 8.6*     CBC: Recent Labs  Lab 01/16/23 0526  WBC 4.1  NEUTROABS 1.7  HGB 11.7*  HCT 34.0*  MCV 98.8  PLT 159    CBG: No results for  input(s): "GLUCAP" in the last 168 hours.   Brief HPI:   Shawn York is a 47 y.o. 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 Adline Peals with 70% removal of tumor. Metronomic Temodar initiated. He completed standard radiation with concurrent temozolomide and Avastin in 2013. MRI on 11/09/2022 suspicious for progression and repeat Mri done 4 weeks later found to have FLAIR lesion. He underwent repeat right temporal craniotomy for resection of tumor on 12/29/2022 by Dr. Lavonna Monarch at [facility]. This was complicated by bleeding from a branch of the MCA and post-op motor deficits of left side and left facial droop. PT, OT and SLP evaluations obtained.  Foley discontinued and the patient is voiding spontaneously.  Hyponatremia corrected with oral supplements. Sodium on 5/09 is 135. Other chemistries WNL. Mild ABLA resolved. Tolerating regular diet with thin liquids. Currently on Decadron 4 mg BID with taper.  Patient's home Trileptal has been restarted.    Hospital Course: Yates Naz was admitted to rehab 01/06/2023 for inpatient therapies to consist of PT, ST and OT at least three hours five days a week. Past admission physiatrist, therapy team and rehab RN have worked together to provide customized collaborative inpatient rehab. Follow-up labs on 5/11 with sodium of 133. Urea Na continues along with 1 liter FR. Follow-up chest x-ray 5/11 unremarkable. Serum sodium followed daily. Robitussin for persistent cough. Nephrology consulted for downward trending serum sodium. Ure-na increased to 30 grams BID.  Steroid taper continued. Complained of sensation of tongue swelling/dysarthria - V2/3 sensory deficits and tongue deviation on exam; discussed likely d/t sensory changes and  biting tongue. Follow-up nephrology 5/14: repeat urine and serum electrolytes confirm SIADH. Fluid restriction continues. Family brings in patient's home Xyzal to help with allergy symptoms/cough. Started e-stim for LUE on 5/14. Neuropsychology consultation on 5/15.   5/15: Called [facility] back as have not yet received call from Dr. Caleen Jobs office. Was informed that patient did cancel appt and next appt available is in July. Will have patient call for appropriate follow up after discharge. No info yet regarding MRI rescheduling. Contacted [facility] center regarding follow up on MRI--per Hem/Onc RN; patient has appt for follow up on 05/24 and they would like MRI brain w/wo contrast to be done close to discharge as possible so they can review it at appt. Order changed to 05/21 am and radiology advised to push films to [facility]. Hem/Onc will look out for those films after that study.   5/16: Contacted [facility] center regarding follow up on MRI--per Hem/Onc RN; patient has appt for follow up on 05/24 and they would like MRI brain w/wo contrast to be done close to discharge as possible so they can review it at appt. Order changed to 05/21 am and radiology advised to push films to [facility]. Hem/Onc will look out for those films after that study.   Dexamethasone taper completed on 5/16. Doxazosin 4 mg at bedtime restarted on 5/16 and scheduled trazadone 50 mg at bedtime for insomnia. Cough improved 5/18. Sodium normal and Ure-na discontinued on 5/16. Sodium remained stable.  MRI brain completed on 5/21. Discussed with patient and wife maintaining fluid restriction on DC. Surgical staples/sutures removed 5/21. Completed 12 day course of doxycycline.   Blood pressures were monitored on TID basis and remained controlled.  Capillary glucose has been monitored with ac/hs CBG checks and SSI was use prn for tighter BS control while on steroid taper.  Rehab course: During patient's stay in rehab weekly team conferences were held  to monitor patient's progress, set goals and discuss barriers to discharge. At admission, patient required min with basic self-care skills and mobility.   He has had improvement in activity tolerance, balance, postural control as well as ability to compensate for deficits. He has had improvement in functional use RUE/LUE  and RLE/LLE as well as improvement in awareness.  Pt amb multiple trials w/o AD and CGA to close supervision on uneven surfaces including down/up ramped surface > 200'.  Pt w/ noted fatigue and dragging L foot w/o LOB.  Discussed need for pt recognition of fatigue and need to sit to avoid falls.  Pt negotiated 4 steps outside w/o rails, step-to gait pattern.  Pt negotiated through auto barriers outside w/ cues for attention to left, tends to rub against.  Pt transferred sit <> stand multiple surfaces outside w/ supervision.  Pt performed car transfer w/ supervision. Pt/OT/ST arranged at [facility].  Disposition: Home  Diet: Regular  Special Instructions: No driving, alcohol consumption or tobacco use.   Discussed with patient and wife maintaining fluid restriction on DC.    30-35 minutes were spent on discharge planning and discharge summary. Discharge Instructions     Ambulatory referral to Occupational Therapy   Complete by: As directed    Eval and treat   Ambulatory referral to Physical Medicine Rehab   Complete by: As directed    Hospital follow up   Ambulatory referral to Physical Therapy   Complete by: As directed    Eval and treat   Ambulatory referral to Speech Therapy   Complete by: As directed    Eval and treat      Allergies as of 01/18/2023       Reactions   Iodinated Contrast Media Rash, Anaphylaxis   Penicillins Shortness Of Breath, Rash   Did it involve swelling of the face/tongue/throat, SOB, or low BP? Yes Did it involve sudden or severe rash/hives, skin peeling, or any reaction on the inside of your mouth or nose? No Did you need to  seek medical attention at a hospital or doctor's office? No When did it last happen? Unk If all above answers are "NO", may proceed with cephalosporin use.   Shellfish Allergy Anaphylaxis, Rash   Shellfish-derived Products Anaphylaxis, Rash        Medication List     STOP taking these medications    diphenhydrAMINE 50 MG tablet Commonly known as: BENADRYL   metoprolol tartrate 50 MG tablet Commonly known as: LOPRESSOR   nebivolol 5 MG tablet Commonly known as: BYSTOLIC   predniSONE 50 MG tablet Commonly known as: DELTASONE       TAKE these medications    acetaminophen 500 MG tablet Commonly known as: TYLENOL Take 2 tablets (1,000 mg total) by mouth every 8 (eight) hours as needed.   b complex vitamins capsule Take 1 capsule by mouth daily.   benzonatate 100 MG capsule Commonly known as: TESSALON Take 1 capsule (100 mg total) by mouth 2 (two) times daily.   brexpiprazole 2 MG Tabs tablet Commonly known as: Rexulti Take 1 tablet (2 mg total) by mouth daily. Take 0.5 mg daily for one week, then increase to 1 mg daily   busPIRone 30 MG tablet Commonly known as: BUSPAR Take 1 tablet (30 mg total) by mouth 2 (two) times daily.   dextromethorphan-guaiFENesin 30-600 MG 12hr tablet Commonly known as: MUCINEX DM Take 1 tablet by mouth  2 (two) times daily as needed for cough.   doxazosin 4 MG tablet Commonly known as: CARDURA Take 1 tablet (4 mg total) by mouth at bedtime.   EPINEPHrine 0.3 mg/0.3 mL Soaj injection Commonly known as: EPI-PEN Inject 0.3 mg into the muscle once as needed (for a severe, allergic reaction).   fluticasone 50 MCG/ACT nasal spray Commonly known as: FLONASE Place 1 spray into both nostrils daily.   levocetirizine 5 MG tablet Commonly known as: XYZAL TAKE 1 TABLET BY MOUTH EVERY DAY IN THE EVENING   naphazoline-glycerin 0.012-0.25 % Soln Commonly known as: CLEAR EYES REDNESS Place 1-2 drops into the left eye 3 (three) times  daily.   Oxcarbazepine 300 MG tablet Commonly known as: TRILEPTAL Take 1 tablet (300 mg total) by mouth 2 (two) times daily.   oxyCODONE 5 MG immediate release tablet Commonly known as: Oxy IR/ROXICODONE Take 1 tablet (5 mg total) by mouth every 6 (six) hours as needed for severe pain.   polyethylene glycol 17 g packet Commonly known as: MIRALAX / GLYCOLAX Take 17 g by mouth daily as needed.   polyvinyl alcohol 1.4 % ophthalmic solution Commonly known as: LIQUIFILM TEARS Place 1 drop into the left eye 3 (three) times daily as needed for dry eyes.   pregabalin 100 MG capsule Commonly known as: LYRICA Take 1 capsule (100 mg total) by mouth 3 (three) times daily.   senna-docusate 8.6-50 MG tablet Commonly known as: Senokot-S Take 2 tablets by mouth 2 (two) times daily.   traZODone 50 MG tablet Commonly known as: DESYREL Take 1 tablet (50 mg total) by mouth at bedtime.        Follow-up Information     Etta Grandchild, MD Follow up.   Specialty: Internal Medicine Why: Call in 1-2 days for post hospital follow up Contact information: [address] [place] [address] [phone]         Lavonna Monarch, MD Follow up.   Specialty: Neurosurgery Contact information: [address] [place] [address] [phone]         Elijah Birk C, DO Follow up.   Specialty: Physical Medicine and Rehabilitation Why: office will call you with follow up appointment Contact information: [address] [place] [address] [phone]         Cedric Fishman, MD Follow up.   Specialty: Oncology Why: 5/24 Contact information: [address] [place] [address] [phone]                 Signed: Milinda Antis 01/18/2023, 11:57 AM

## 2023-01-12 NOTE — Progress Notes (Signed)
Occupational Therapy Session Note  Patient Details  Name: Shawn York MRN: [id number] Date of Birth: May 04, 1976  Today's Date: 01/12/2023 OT Individual Time: 1030-1200 OT Individual Time Calculation (min): 90 min    Short Term Goals: Week 1:  OT Short Term Goal 1 (Week 1): Pt will groom in standing with CGA OT Short Term Goal 2 (Week 1): Pt will don shirt wiht MIN A OT Short Term Goal 3 (Week 1): PT will complete TTB wiht CGA OT Short Term Goal 4 (Week 1): Pt will demo active digit extension to demo improved NMR  Skilled Therapeutic Interventions/Progress Updates:     Pt received in bed with no pain reported but excited for outing (still presents flat). ADL: Pt completes toileting with CGA for transfers and cuing/HOH A for incorporating LUE into clothing management. Pt stands for oral care with cuing for strategies for incorporating LUE into grooming tasks at sink  Community Reintegration: Pt engages in outing to [employer] (where he works). Overall pt is CGA with min cuing for L foot clearance especially when carrying gift basket at end of trip. Forced use of LUE to reach for various objects with MAX A for failitation of full open to grasp/release objects. Pt is able ot navigate stairs with CGA with cuing for hemi strategy. Pt often stopping and giving hugs in standing to friends/coworkers at store. Forced use of LUE with VC/TC for incorporating LUE into hug. Pt often intermittently tearful during interactions of coworkers. Pt very grateful for visiting store. To/from store pt educated on energy conservation, resting strategies, and discussing strengths and weaknesses that impact recovery.  Saebo Stim One-wrist extensors 60 min 330 pulse width 35 Hz pulse rate On 8 sec/ off 8 sec Ramp up/ down 2 sec Symmetrical Biphasic wave form  Max intensity at 500 Ohm load   Pt left at end of session in recliner with exit alarm on, call light in reach and all needs met   Therapy  Documentation Precautions:  Precautions Precautions: Fall Precaution Comments: L hemi, R crani Restrictions Weight Bearing Restrictions: No  Therapy/Group: Individual Therapy  [name] 01/12/2023, 6:50 AM

## 2023-01-12 NOTE — Progress Notes (Signed)
Endorses throughout the night "twitches" with his left foot. Explains waking him up at times and endorses that he is noticing being able to begin moving left extremities. From assessment no spasticity observed. Did observe out toeing of left foot.

## 2023-01-12 NOTE — Progress Notes (Signed)
Contacted [facility] center regarding follow up on MRI--per Hem/Onc RN; patient has appt for follow up on 05/24 and they would like MRI brain w/wo contrast to be done close to discharge as possible so they can review it at appt. Order changed to 05/21 am and radiology advised to push films to [facility]. Hem/Onc will look out for those films after that study.

## 2023-01-12 NOTE — Progress Notes (Signed)
Physical Therapy Session Note  Patient Details  Name: Shawn York MRN: [id number] Date of Birth: Jan 08, 1976  Today's Date: 01/12/2023 PT Individual Time: 0900-0959 PT Individual Time Calculation (min): 59 min  and Today's Date: 01/12/2023 PT Missed Time: 30 Minutes Missed Time Reason: Patient fatigue  Short Term Goals: Week 1:  PT Short Term Goal 1 (Week 1): Patient will complete bed mobility with Supv PT Short Term Goal 2 (Week 1): Patient will ambulate >200' with CGA PT Short Term Goal 3 (Week 1): Patient will ascend/descend 12 steps with CGA  Skilled Therapeutic Interventions/Progress Updates:     1st Session:  Pt received supine in bed and agrees to therapy. Reports headache, unchanged from baseline. PT provides rest breaks as needed to manage pain. Pt requests shower. Pt performs bed mobility with cues for positioning at EOB. Pt performs ambulatory transfer to [facility] with CGA. PT provides cues to sit on BSC to doff scrubs, then pt transfers to shower bench with CGA. Pt showers with close supervision and cues for safety, otherwise no assistance required. Pt transfers back to bed with CGA and cues for positioning. Pt dons clean scrubs while seated at EOB. Pt ambulates to gym with CGA and cues for upright gaze to improve posture and balance. PT provides pt with WC for community outing later in the day. Pt then participates in Wii bowling activity to challenge dynamic balance, with pt tasked with performing large amplitude, multiplanar movements including stepping in multiple directions, combined with fine motor task. PT provides cues for optimal performance and body mechanics. Pt has LOB during activity when stepping backward, with PT utilizing gait belt to lower pt until he propped on PT's lower extremities. Pt able to stand back up with minA/modA. Pt takes seated rest break. Pt then ambulates x300' and trials SPC (per pt request). PT provides cues for optimal use of SPC but quickly determines that  [facility] is not functional for pt, as it forces him to multitask and also pushes pt to the L, which is his side of weakness. PT recommends that pt not use SPC at this time and pt is agreeable. Pt left seated in recliner with alarm intact and all needs within reach.   2nd Session: Pt supine in bed upon PT arrival. Pt requests to rest at this time. PT will follow up as able.   Therapy Documentation Precautions:  Precautions Precautions: Fall Precaution Comments: L hemi, R crani Restrictions Weight Bearing Restrictions: No  Therapy/Group: Individual Therapy  Beau Fanny, PT, DPT 01/12/2023, 5:01 PM

## 2023-01-12 NOTE — Progress Notes (Signed)
PROGRESS NOTE   Subjective/Complaints: No acute complaints. No events overnight.  Ongoing cough with HA. Does not think he started Xyzal yet. Agreeable to retrying tessalon perles and flonase. Asking for sleep medications   ROS:  +cough - ongoing + HA-  ongoing + Sleep difficulty - new Denies fevers, chills, CP, SOB, abd pain, N/V/D/C, or any other complaints at this time.    Objective:   No results found. No results for input(s): "WBC", "HGB", "HCT", "PLT" in the last 72 hours.  Recent Labs    01/11/23 0759 01/12/23 0718  NA 134* 136  K 3.9 3.8  CL 101 103  CO2 23 24  GLUCOSE 102* 105*  BUN 39* 47*  CREATININE 1.21 1.02  CALCIUM 9.2 8.8*     Intake/Output Summary (Last 24 hours) at 01/12/2023 1509 Last data filed at 01/12/2023 1457 Gross per 24 hour  Intake 1795 ml  Output 2000 ml  Net -205 ml         Physical Exam: Vital Signs Blood pressure (!) 127/95, pulse 69, temperature 97.9 F (36.6 C), resp. rate 18, height 6\' 1"  (1.854 m), weight 95.4 kg, SpO2 97 %.  Constitutional: No apparent distress. Appropriate appearance for age. Working with OT.  HENT: No JVD. Neck Supple. Trachea midline. + R craniotomy, Healed scar to anterior R neck from prior ACDF.  Eyes: PERRLA. EOMI. Visual fields grossly intact.  Cardiovascular: RRR, no murmurs/rub/gallops. No Edema. Peripheral pulses 2+  Respiratory: CTAB. No rales, rhonchi, or wheezing. On RA. +dry cough-ongoing - mild Abdomen: + bowel sounds, normoactive. No distention or tenderness.  Skin: C/D/I. No apparent lesions aside from R craniotomy wound MSK:      No apparent deformity.       Strength:                RUE: 5/5 SA, 5/5 EF, 5/5 EE, 5/5 WE, 5/5 FF, 5/5 FA                 LUE: 4/5 throughout - unchanged                RLE: 5/5 HF, 5/5 KE, 5/5 DF, 5/5 EHL, 5/5 PF                 LLE:  5-/5 HF, 5-/5 KE, 5/5 DF, 5/5 EHL, 5/5 PF   Neurologic exam: +HA  with extreme lateral gaze bilaterally -not noted today Cognition: AAO to person, place, time and event.  + Significant motor delay in LUE > LLE - improving Language: Fluent, No substitutions or neoglisms. No dysarthria. Names 3/3 objects correctly.  Memory: Recalls 3/3 objects at 5 minutes. No apparent deficits  Insight: Good  insight into current condition.  Mood: Pleasant affect, appropriate mood.  Sensation: To light touch intact in BL UEs and LEs  Reflexes: 2+ in BL UE and LEs. + L Hoffman's  CN: + L facial droop + L  V2/3 sensory loss + L tongue deviation Coordination: Fine motor difficulty LUE Spasticity: MAS 0 in all extremities.   Assessment/Plan: 1. Functional deficits which require 3+ hours per day of interdisciplinary therapy in a comprehensive inpatient rehab setting. Physiatrist is  providing close team supervision and 24 hour management of active medical problems listed below. Physiatrist and rehab team continue to assess barriers to discharge/monitor patient progress toward functional and medical goals  Care Tool:  Bathing    Body parts bathed by patient: Right arm, Left arm, Chest, Abdomen, Front perineal area, Buttocks, Right upper leg, Right lower leg, Left upper leg, Left lower leg, Face         Bathing assist Assist Level: Minimal Assistance - Patient > 75%     Upper Body Dressing/Undressing Upper body dressing   What is the patient wearing?: Pull over shirt    Upper body assist Assist Level: Moderate Assistance - Patient 50 - 74%    Lower Body Dressing/Undressing Lower body dressing      What is the patient wearing?: Pants     Lower body assist Assist for lower body dressing: Moderate Assistance - Patient 50 - 74%     Toileting Toileting    Toileting assist Assist for toileting: Minimal Assistance - Patient > 75%     Transfers Chair/bed transfer  Transfers assist     Chair/bed transfer assist level: Minimal Assistance - Patient > 75%      Locomotion Ambulation   Ambulation assist      Assist level: Minimal Assistance - Patient > 75% Assistive device: No Device Max distance: 200+   Walk 10 feet activity   Assist     Assist level: Minimal Assistance - Patient > 75% Assistive device: No Device   Walk 50 feet activity   Assist    Assist level: Minimal Assistance - Patient > 75% Assistive device: No Device    Walk 150 feet activity   Assist    Assist level: Minimal Assistance - Patient > 75% Assistive device: No Device    Walk 10 feet on uneven surface  activity   Assist     Assist level: Minimal Assistance - Patient > 75% Assistive device: Other (comment) (No AD)   Wheelchair     Assist Is the patient using a wheelchair?: No             Wheelchair 50 feet with 2 turns activity    Assist            Wheelchair 150 feet activity     Assist          Blood pressure (!) 127/95, pulse 69, temperature 97.9 F (36.6 C), resp. rate 18, height 6\' 1"  (1.854 m), weight 95.4 kg, SpO2 97 %.  Medical Problem List and Plan: 1. 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 Lavonna Monarch, c/b MCA bleeding with [facility]             -patient may shower             -ELOS/Goals: 10-14 days, supervision to Mod I PT/OT; DC date 5/22   -Continue CIR   - started e-stim LUE 5/14  - 5/14: Per teams, CGA-Min A transfers and ADLs. Gets overwhelmed and frustrated with L hand functional tasks. Similar for PT. Doing very well overall, good motivation and family support. Needs tub bench at DC.    2.  Antithrombotics: -DVT/anticoagulation:  Pharmaceutical: Heparin 5000U q8h             -antiplatelet therapy: none   3. Pain Management: Tylenol and Robaxin as needed; oxycodone 10 mg q 4 hours prn             --  Lyrica 100 mg TID for HA   - 5/14: Schedule Tylenol 1000 mg TID; decrease oxycodone to 5 mg Q6H PRN   -  5/15: monitor HA with xyzal as below    4. Mood/Behavior/Sleep: LCSW to evaluate and provide emotional support. History of depressed mood, anxiety, PTSD             -antipsychotic agents: continue Rexulti 2 mg daily             -continue Buspar 30 mg BID  -Melatonin 3mg  QHS   - 5/16: On chart review takes Doxazosin 4 mg QHS at home for PTSD; resumed. Added PRN trazoodne 50 mg    5. Neuropsych/cognition: This patient is capable of making decisions on his own behalf.   6. Skin/Wound Care: Routine skin care checks             -monitor surgical incision (POD #8 on 5/10)   7. Fluids/Electrolytes/Nutrition: Routine Is and Os. Check CMET in am and daily BMET for 5-7 days.  -01/07/23 Na 133 (see #9), BUN marginally elevated at 24 but Cr WNL, BMP otherwise fairly unremarkable; monitor labs   -01/08/23 BMP stable from yesterday, see below for Na/AKI discussion Recent Labs    01/09/23 1629 01/09/23 2124 01/10/23 0603  GLUCAP 131* 111* 103*    -5/13: Blood sugars well-controlled despite steroid taper, discontinue CBGs  8: Seizure prophylaxis: Continue Trileptal 300 mg BID             --has stable auras w/epigastric rising and mild dissociation.    9: Hyponatremia: To Continue Ure NA for 10 days (end 01/16/23)  --Continue 1 Liter fluid restriction with Gatorade prn -01/07/23 Na 133, monitor on labs -01/08/23 Na slightly lower at 131, cont fluid restriction and regimen for now, monitor on daily labs   5/13: NA continues to downtrend, nephrology consulted, appreciate recommendations; increase Urease and stop Gatorade accommodations for fluid restriction per their recommendations.  Daily BMPs for follow-up.  5/14: NA back to 131, continue nephrology recommendations  5.15: Labs pending this AM. per nephro, when Na is up to 135 can decrease UreNa back to 15 g BID and resume taper as written in d/c instructions from [facility] (15 g BID x 10 days then off)    - 5/16: NA 136; reduce urena to 15 mg BID  10:  Post-op craniotomy: continue steroid taper: -Decadron 4 mg for 4 days, 2 mg BID for two days and then 2 mg once daily for one day --appt 05/15 10 am for Rad MRI [phone] and Dr. Burtis Junes office @ 3 pm at cancer center [phone]. -Discussing with social work regarding transportation versus rescheduling     11: Persistent Cough: On Doxycycline X 12 days per recommendations. Tessalon ineffective.  -01/07/23 CXR unremarkable, monitor cough, cont Robitussin PRN  5/13: Persistent; recheck chest x-ray, add flutter valve for secretions 5/14: CXR clear, flutter valve worsening. Can bring in home Xyzal to swap out for Claritin. Added PRN inhaler 5/15: Xyzal started. -- has not brought in from home, waiting. Added flonase and tessalon perles.    12: Seasonal allergies: continue loratadine--advised to bring Xyzal from home as this works better-- not on formulary here. Continue Flonase--not reordered, defer to primary since he's also on decadron   13: BP goals: SBP between 120-140 to allow for perfusion. If above 140, monitor and do not give PRNs per NS  -01/07/23 BPs mostly at goal, monitor closely  -01/08/23 BPs at goal since last night, monitor  5/13: Diastolic HTN; decreased steroids today, monitor  5/14-15: MUCH better with steroid taper; monitor  Vitals:   01/08/23 1933 01/09/23 0435 01/09/23 1326 01/09/23 2059  BP: (!) 147/96 130/86 (!) 136/90 (!) 136/91   01/10/23 0408 01/10/23 1314 01/10/23 1945 01/11/23 0411  BP: 124/86 124/80 (!) 128/102 127/84   01/11/23 1403 01/11/23 1948 01/12/23 0522 01/12/23 1325  BP: 128/83 118/78 118/81 (!) 127/95      14. Constipation: Has resolved with use of Sennakot 2 tabs BID and Miralax 17g BID, monitor for oversoftening  - LBM 5/15, large   15. GI prophylaxis: continue Protonix 40mg  QD  16. Sensation of tongue swelling/dysarthria - V2/3 sensory deficits and tongue deviation on exam; discussed likely d/t sensory changes and biting tongue; patient  agreeing. Monitor, SLP eval pending.     LOS: 6 days A FACE TO FACE EVALUATION WAS PERFORMED  Angelina Sheriff 01/12/2023, 3:09 PM

## 2023-01-13 DIAGNOSIS — E871 Hypo-osmolality and hyponatremia: Secondary | ICD-10-CM | POA: Diagnosis not present

## 2023-01-13 DIAGNOSIS — R053 Chronic cough: Secondary | ICD-10-CM | POA: Diagnosis not present

## 2023-01-13 DIAGNOSIS — C719 Malignant neoplasm of brain, unspecified: Secondary | ICD-10-CM | POA: Diagnosis not present

## 2023-01-13 DIAGNOSIS — G479 Sleep disorder, unspecified: Secondary | ICD-10-CM

## 2023-01-13 LAB — BASIC METABOLIC PANEL
Anion gap: 10 (ref 5–15)
BUN: 41 mg/dL — ABNORMAL HIGH (ref 6–20)
CO2: 25 mmol/L (ref 22–32)
Calcium: 9 mg/dL (ref 8.9–10.3)
Chloride: 103 mmol/L (ref 98–111)
Creatinine, Ser: 0.94 mg/dL (ref 0.61–1.24)
GFR, Estimated: >60 mL/min (ref >60)
Glucose, Bld: 94 mg/dL (ref 70–99)
Potassium: 4 mmol/L (ref 3.5–5.1)
Sodium: 138 mmol/L (ref 135–145)

## 2023-01-13 MED ORDER — TRAZODONE HCL 50 MG PO TABS
50.0000 mg | ORAL_TABLET | Freq: Every day | ORAL | Status: DC
  Administered 2023-01-13 – 2023-01-17 (×5): 50 mg via ORAL
  Filled 2023-01-13 (×5): qty 1

## 2023-01-13 MED ORDER — DM-GUAIFENESIN ER 30-600 MG PO TB12
1.0000 | ORAL_TABLET | Freq: Two times a day (BID) | ORAL | Status: DC
  Administered 2023-01-13 – 2023-01-18 (×11): 1 via ORAL
  Filled 2023-01-13 (×11): qty 1

## 2023-01-13 NOTE — Progress Notes (Signed)
Occupational Therapy Weekly Progress Note  Patient Details  Name: Shawn York MRN: [id number] Date of Birth: 01-27-1976  Beginning of progress report period: Jan 07, 2023 End of progress report period: Jan 13, 2023  Today's Date: 01/13/2023 OT Individual Time: 0700-0800 OT Individual Time Calculation (min): 60 min   Today's Date: 01/13/2023 OT Individual Time: 1330-1430 OT Individual Time Calculation (min): 60 min   Patient has met 4 of 4 short term goals.  Pt currently completes BADLs at CGA-supervision level. Pt has improved his LUE activation to include active digit extension at all joints but MCPs>DID/PIP extension. Pt remains hard on himself not able to fully acknowledge progress but continues to work very hard towards goals.   Patient continues to demonstrate the following deficits: muscle weakness, decreased cardiorespiratoy endurance, impaired timing and sequencing, unbalanced muscle activation, and decreased coordination, decreased attention to left, decreased attention, decreased awareness, decreased problem solving, decreased safety awareness, decreased memory, and delayed processing, and decreased sitting balance, decreased standing balance, decreased postural control, hemiplegia, and decreased balance strategies and therefore will continue to benefit from skilled OT intervention to enhance overall performance with BADL and iADL.  Patient progressing toward long term goals..  Continue plan of care.  OT Short Term Goals Week 1:  OT Short Term Goal 1 (Week 1): Pt will groom in standing with CGA OT Short Term Goal 1 - Progress (Week 1): Met OT Short Term Goal 2 (Week 1): Pt will don shirt wiht MIN A OT Short Term Goal 2 - Progress (Week 1): Met OT Short Term Goal 3 (Week 1): PT will complete TTB wiht CGA OT Short Term Goal 3 - Progress (Week 1): Met OT Short Term Goal 4 (Week 1): Pt will demo active digit extension to demo improved NMR OT Short Term Goal 4 - Progress (Week 1):  Met Week 2:  OT Short Term Goal 1 (Week 2): STG=LTG d/t ELOS  Skilled Therapeutic Interventions/Progress Updates:     Pt received in bed with "mild" R hip pain.  Offered ice but pt stated, lets just walk it out.  ADL: Pt completes ADL at overall supervision Level. Skilled interventions include: use of LUE for washing more than just R arm/axilla, HOH facilitation of LUE grasp to pull pants with instruction to slide hand along wasteband to pull up side and back. Seated toileting with supervision. Occasional L toe catching, but recovers well.   Neuromuscular Reeducation BITS with intermittent first digit extension isolation for functional reach seated. Advanced to standing and holding drumstick with built up handle and coban for friction.  Pt completes 3x1 min beach ball volley in seated position with 1 # dowel rod for dynamic balance, postural control, BUE strengthening and endurance required for BADLs and functional transfers. Cuing for level bar with all reaching as LUE droops often  Pt left at end of session in bed with exit alarm on, call light in reach and all needs met  Session 2:  Pt received in bed with no pain. Functional mobility to/from all tx spaces with supervision with intermittent L toe catching especially when dual tasking carrying objects in LUE. Pt does not need physical A to recover. Pt overall continues to demo L inattention by bumping into items or doorways on L.   Neuromuscular Reeducation Focus of session on grasp/release and functional reach crossing midline with tactile cues and guiding A for NMR. Pt is able to actively open/close hands with increased time and verbal cuing for drawing attention to specific movements  to improve mid range reach in conjunction with grasp and release. Pt often frustrated needing reminder to breathe and take breaks when frustrated. Pt holds drum stick in hand and attempted to use rhythm for accessing timing and maintaining grasp.   Pt left  at end of session in bed with exit alarm on, call light in reach and all needs met    Therapy Documentation Precautions:  Precautions Precautions: Fall Precaution Comments: L hemi, R crani Restrictions Weight Bearing Restrictions: No Therapy/Group: Individual Therapy  [name] 01/13/2023, 6:53 AM

## 2023-01-13 NOTE — Progress Notes (Signed)
Physical Therapy Session Note  Patient Details  Name: Shawn York MRN: [id number] Date of Birth: [birth year]  Today's Date: 01/13/2023 PT Individual Time: 0800-0905 PT Individual Time Calculation (min): 65 min   Short Term Goals: Week 1:  PT Short Term Goal 1 (Week 1): Patient will complete bed mobility with Supv PT Short Term Goal 2 (Week 1): Patient will ambulate >200' with CGA PT Short Term Goal 3 (Week 1): Patient will ascend/descend 12 steps with CGA  Skilled Therapeutic Interventions/Progress Updates: Pt presents supine in bed and agreeable to therapy, although states fatigue.  Pt transfers sup to sit w/ supervision and dons shoes mod I.  Pt transfers sit to stand w/ supervision and amb to w/c.  PT wheeled to [facility] for therapy on uneven surfaces and change in scenery.  Pt amb multiple trials w/o AD and CGA to close supervision on uneven surfaces including down/up ramped surface > 200'.  Pt w/ noted fatigue and dragging L foot w/o LOB.  Discussed need for pt recognition of fatigue and need to sit to avoid falls.  Pt negotiated 4 steps outside w/o rails, step-to gait pattern.  Pt negotiated through auto barriers outside w/ cues for attention to left, tends to rub against.  Pt transferred sit <> stand multiple surfaces outside w/ supervision.  Pt performed car transfer w/ supervision.  Pt returned to room and transferred sit to supine in bed w/ supervision.  Bed alarm on and all needs in reach.  NT to bring coffee.  Missed time of 10' 2/2 fatigue.     Therapy Documentation Precautions:  Precautions Precautions: Fall Precaution Comments: L hemi, R crani Restrictions Weight Bearing Restrictions: No General: PT Amount of Missed Time (min): 10 Minutes PT Missed Treatment Reason: Patient fatigue Vital Signs:   Pain:4/10 R hip Pain Assessment Pain Scale: 0-10 Pain Score: 0-No pain    Therapy/Group: Individual Therapy  Lucio Edward 01/13/2023, 9:09 AM

## 2023-01-13 NOTE — Progress Notes (Addendum)
PROGRESS NOTE   Subjective/Complaints: Still reports a semi-productive cough--seems to be higher in his chest. Headaches are present as well. Asked for trazodone twice but didn't receive. Had some difficulty falling asleep   ROS: Patient denies fever, rash, sore throat, blurred vision, dizziness, nausea, vomiting, diarrhea,  shortness of breath or chest pain, joint or back/neck pain,  or mood change.    Objective:   No results found. No results for input(s): "WBC", "HGB", "HCT", "PLT" in the last 72 hours.  Recent Labs    01/12/23 0718 01/13/23 0650  NA 136 138  K 3.8 4.0  CL 103 103  CO2 24 25  GLUCOSE 105* 94  BUN 47* 41*  CREATININE 1.02 0.94  CALCIUM 8.8* 9.0    Intake/Output Summary (Last 24 hours) at 01/13/2023 1030 Last data filed at 01/13/2023 0805 Gross per 24 hour  Intake 550 ml  Output 1150 ml  Net -600 ml        Physical Exam: Vital Signs Blood pressure 117/82, pulse 69, temperature 97.7 F (36.5 C), temperature source Oral, resp. rate 18, height 6\' 1"  (1.854 m), weight 95.4 kg, SpO2 96 %.  Constitutional: No distress . Vital signs reviewed. HEENT: NCAT, EOMI, oral membranes moist Neck: supple Cardiovascular: RRR without murmur. No JVD    Respiratory/Chest: CTA Bilaterally without wheezes or rales. Normal effort. Occasional dry cough    GI/Abdomen: BS +, non-tender, non-distended Ext: no clubbing, cyanosis, or edema Psych: pleasant and cooperative  Skin: C/D/I. No apparent lesions aside from R craniotomy wound, prior ACDF MSK: normal PROM, no jt pain Neuro:       Strength:                RUE: 5/5 SA, 5/5 EF, 5/5 EE, 5/5 WE, 5/5 FF, 5/5 FA                 LUE: 4/5, weak finger extensors, early flexor tone left wrist/finger flexors                RLE: 5/5 HF, 5/5 KE, 5/5 DF, 5/5 EHL, 5/5 PF                 LLE:  5-/5 HF, 5-/5 KE, 5/5 DF, 5/5 EHL, 5/5 PF    :   Cognition: AAO to person,  place, time and event.  Ongoing motor delay in LUE > LLE - improving Language: Fluent, No substitutions or neoglisms.   Sensation: To light touch intact in BL UEs and LEs  Reflexes: 2+ in BL UE and LEs. + L Hoffman's  CN: + L facial droop + L  V2/3 sensory loss + L tongue deviation, speech fairly clear Coordination: Fine motor difficulty LUE  .   Assessment/Plan: 1. Functional deficits which require 3+ hours per day of interdisciplinary therapy in a comprehensive inpatient rehab setting. Physiatrist is providing close team supervision and 24 hour management of active medical problems listed below. Physiatrist and rehab team continue to assess barriers to discharge/monitor patient progress toward functional and medical goals  Care Tool:  Bathing    Body parts bathed by patient: Right arm, Left arm, Chest, Abdomen, Front perineal area,  Buttocks, Right upper leg, Right lower leg, Left upper leg, Left lower leg, Face         Bathing assist Assist Level: Minimal Assistance - Patient > 75%     Upper Body Dressing/Undressing Upper body dressing   What is the patient wearing?: Pull over shirt    Upper body assist Assist Level: Moderate Assistance - Patient 50 - 74%    Lower Body Dressing/Undressing Lower body dressing      What is the patient wearing?: Pants     Lower body assist Assist for lower body dressing: Moderate Assistance - Patient 50 - 74%     Toileting Toileting    Toileting assist Assist for toileting: Minimal Assistance - Patient > 75%     Transfers Chair/bed transfer  Transfers assist     Chair/bed transfer assist level: Minimal Assistance - Patient > 75%     Locomotion Ambulation   Ambulation assist      Assist level: Contact Guard/Touching assist Assistive device: No Device Max distance: 200+   Walk 10 feet activity   Assist     Assist level: Contact Guard/Touching assist Assistive device: No Device   Walk 50 feet  activity   Assist    Assist level: Contact Guard/Touching assist Assistive device: No Device    Walk 150 feet activity   Assist    Assist level: Contact Guard/Touching assist Assistive device: No Device    Walk 10 feet on uneven surface  activity   Assist     Assist level: Contact Guard/Touching assist Assistive device: Other (comment)   Wheelchair     Assist Is the patient using a wheelchair?: No             Wheelchair 50 feet with 2 turns activity    Assist            Wheelchair 150 feet activity     Assist          Blood pressure 117/82, pulse 69, temperature 97.7 F (36.5 C), temperature source Oral, resp. rate 18, height 6\' 1"  (1.854 m), weight 95.4 kg, SpO2 96 %.  Medical Problem List and Plan: 1. 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 Lavonna Monarch, c/b MCA bleeding with [facility]             -patient may shower             -ELOS/Goals: 10-14 days, supervision to Mod I PT/OT; DC date 5/22   -Continue CIR therapies including PT, OT, and SLP    - started e-stim LUE 5/14  - 5/14: Per teams, CGA-Min A transfers and ADLs. Gets overwhelmed and frustrated with L hand functional tasks. Similar for PT. Doing very well overall, good motivation and family support. Needs tub bench at DC.    2.  Antithrombotics: -DVT/anticoagulation:  Pharmaceutical: Heparin 5000U q8h             -antiplatelet therapy: none   3. Pain Management/spasticity: Tylenol and Robaxin as needed; oxycodone 10 mg q 4 hours prn             --Lyrica 100 mg TID for HA   - 5/14: Schedule Tylenol 1000 mg TID; decrease oxycodone to 5 mg Q6H PRN   - 5/15: monitor HA with xyzal as below     -5/17--early flexor tone LUE--continue stretching, splinting, e-stim of extensors 4. Mood/Behavior/Sleep: LCSW to evaluate and provide emotional support. History of  depressed mood, anxiety, PTSD              -antipsychotic agents: continue Rexulti 2 mg daily             -continue Buspar 30 mg BID  -Melatonin 3mg  QHS   - 5/16: On chart review takes Doxazosin 4 mg QHS at home for PTSD; resumed.  -5/17 will schedule trazodone 50mg  qhs    5. Neuropsych/cognition: This patient is capable of making decisions on his own behalf.   6. Skin/Wound Care: Routine skin care checks             -monitor surgical incision (POD #8 on 5/10)   7. Fluids/Electrolytes/Nutrition: Routine Is and Os. Check CMET in am and daily BMET for 5-7 days.  -01/07/23 Na 133 (see #9), BUN marginally elevated at 24 but Cr WNL, BMP otherwise fairly unremarkable; monitor labs   -01/08/23 BMP stable from yesterday, see below for Na/AKI discussion No results for input(s): "GLUCAP" in the last 72 hours. -5/13: Blood sugars well-controlled despite steroid taper, discontinue CBGs  8: Seizure prophylaxis: Continue Trileptal 300 mg BID             --has stable auras w/epigastric rising and mild dissociation.    9: Hyponatremia: To Continue Ure NA for 10 days (end 01/16/23)  --Continue 1 Liter fluid restriction with Gatorade prn -01/07/23 Na 133, monitor on labs -01/08/23 Na slightly lower at 131, cont fluid restriction and regimen for now, monitor on daily labs   5/13: NA continues to downtrend, nephrology consulted, appreciate recommendations; increase Urease and stop Gatorade accommodations for fluid restriction per their recommendations.  Daily BMPs for follow-up.  5/14: NA back to 131, continue nephrology recommendations  5.15: Labs pending this AM. per nephro, when Na is up to 135 can decrease UreNa back to 15 g BID and resume taper as written in d/c instructions from [facility] (15 g BID x 10 days then off)    - 5/16: NA 136; reduce urena to 15 mg BID   -5/17 Na up to 138--continue urena per taper instructions above 10: Post-op craniotomy: continue steroid taper: -Decadron 4 mg for 4 days, 2 mg BID for two days and then 2 mg once daily for  one day --appt 05/15 10 am for Rad MRI [phone] and Dr. Burtis Junes office @ 3 pm at cancer center [phone]. -Discussing with social work regarding transportation versus rescheduling     11: Persistent Cough: On Doxycycline X 12 days per recommendations. Tessalon ineffective.  -01/07/23 CXR unremarkable, monitor cough, cont Robitussin PRN  5/13: Persistent; recheck chest x-ray, add flutter valve for secretions 5/14: CXR clear, flutter valve worsening. Can bring in home Xyzal to swap out for Claritin. Added PRN inhaler 5/15: Xyzal started. -- has not brought in from home, waiting. Added flonase and tessalon perles.    5/17 still waiting on Xyzal. Will add scheduled mucinex dm to help with cough/loosening of secretions although it's only a semi-productive cough 12: Seasonal allergies: continue loratadine--advised to bring Xyzal from home as this works better-- not on formulary here. Continue Flonase--not reordered, defer to primary since he's also on decadron   13: BP goals: SBP between 120-140 to allow for perfusion. If above 140, monitor and do not give PRNs per NS  -01/07/23 BPs mostly at goal, monitor closely  -01/08/23 BPs at goal since last night, monitor  5/13: Diastolic HTN; decreased steroids today, monitor  5/14-17: MUCH better with steroid taper; monitor  Vitals:  01/09/23 1326 01/09/23 2059 01/10/23 0408 01/10/23 1314  BP: (!) 136/90 (!) 136/91 124/86 124/80   01/10/23 1945 01/11/23 0411 01/11/23 1403 01/11/23 1948  BP: (!) 128/102 127/84 128/83 118/78   01/12/23 0522 01/12/23 1325 01/12/23 2000 01/13/23 0506  BP: 118/81 (!) 127/95 124/87 117/82      14. Constipation: Has resolved with use of Sennakot 2 tabs BID and Miralax 17g BID, monitor for oversoftening  - LBM 5/17   15. GI prophylaxis: continue Protonix 40mg  QD  16. Sensation of tongue swelling/dysarthria - V2/3 sensory deficits and tongue deviation on exam; discussed likely d/t sensory changes and biting tongue;  patient agreeing. Monitor, SLP sees patient.     LOS: 7 days A FACE TO FACE EVALUATION WAS PERFORMED  Ranelle Oyster 01/13/2023, 10:30 AM

## 2023-01-14 DIAGNOSIS — I10 Essential (primary) hypertension: Secondary | ICD-10-CM | POA: Diagnosis not present

## 2023-01-14 DIAGNOSIS — C719 Malignant neoplasm of brain, unspecified: Secondary | ICD-10-CM | POA: Diagnosis not present

## 2023-01-14 DIAGNOSIS — E871 Hypo-osmolality and hyponatremia: Secondary | ICD-10-CM | POA: Diagnosis not present

## 2023-01-14 LAB — BASIC METABOLIC PANEL
Anion gap: 8 (ref 5–15)
BUN: 37 mg/dL — ABNORMAL HIGH (ref 6–20)
CO2: 24 mmol/L (ref 22–32)
Calcium: 8.8 mg/dL — ABNORMAL LOW (ref 8.9–10.3)
Chloride: 103 mmol/L (ref 98–111)
Creatinine, Ser: 0.96 mg/dL (ref 0.61–1.24)
GFR, Estimated: >60 mL/min (ref >60)
Glucose, Bld: 91 mg/dL (ref 70–99)
Potassium: 3.9 mmol/L (ref 3.5–5.1)
Sodium: 135 mmol/L (ref 135–145)

## 2023-01-14 NOTE — Progress Notes (Signed)
PROGRESS NOTE   Subjective/Complaints:  Pt doing alright, slept ok until 3am, woke up with some back pain, got comfortable and was able to rest again. Denies pain currently. LBM this morning. Urinating fine. Denies any other complaints or concerns.    ROS: Patient denies fever, rash, sore throat, blurred vision, dizziness, nausea, vomiting, diarrhea,  shortness of breath or chest pain, joint or back/neck pain,  or mood change.    Objective:   No results found. No results for input(s): "WBC", "HGB", "HCT", "PLT" in the last 72 hours.  Recent Labs    01/13/23 0650 01/14/23 0615  NA 138 135  K 4.0 3.9  CL 103 103  CO2 25 24  GLUCOSE 94 91  BUN 41* 37*  CREATININE 0.94 0.96  CALCIUM 9.0 8.8*    Intake/Output Summary (Last 24 hours) at 01/14/2023 1254 Last data filed at 01/14/2023 0740 Gross per 24 hour  Intake 708 ml  Output 800 ml  Net -92 ml        Physical Exam: Vital Signs Blood pressure 109/83, pulse 66, temperature 97.8 F (36.6 C), temperature source Oral, resp. rate 18, height 6\' 1"  (1.854 m), weight 95.4 kg, SpO2 99 %.  Constitutional: No distress . Vital signs reviewed. Laying in bed HEENT: NCAT, EOMI, oral membranes moist Neck: supple Cardiovascular: RRR without murmur. No JVD    Respiratory/Chest: CTA Bilaterally without wheezes or rales. Normal effort. No cough during exam.    GI/Abdomen: BS +, non-tender, non-distended Ext: no clubbing, cyanosis, or edema Psych: pleasant and cooperative  Skin: C/D/I. No apparent lesions aside from R craniotomy wound, prior ACDF  PRIOR EXAMS: MSK: normal PROM, no jt pain Neuro:       Strength:                RUE: 5/5 SA, 5/5 EF, 5/5 EE, 5/5 WE, 5/5 FF, 5/5 FA                 LUE: 4/5, weak finger extensors, early flexor tone left wrist/finger flexors                RLE: 5/5 HF, 5/5 KE, 5/5 DF, 5/5 EHL, 5/5 PF                 LLE:  5-/5 HF, 5-/5 KE, 5/5 DF,  5/5 EHL, 5/5 PF    :   Cognition: AAO to person, place, time and event.  Ongoing motor delay in LUE > LLE - improving Language: Fluent, No substitutions or neoglisms.   Sensation: To light touch intact in BL UEs and LEs  Reflexes: 2+ in BL UE and LEs. + L Hoffman's  CN: + L facial droop + L  V2/3 sensory loss + L tongue deviation, speech fairly clear Coordination: Fine motor difficulty LUE  .   Assessment/Plan: 1. Functional deficits which require 3+ hours per day of interdisciplinary therapy in a comprehensive inpatient rehab setting. Physiatrist is providing close team supervision and 24 hour management of active medical problems listed below. Physiatrist and rehab team continue to assess barriers to discharge/monitor patient progress toward functional and medical goals  Care Tool:  Bathing  Body parts bathed by patient: Right arm, Left arm, Chest, Abdomen, Front perineal area, Buttocks, Right upper leg, Right lower leg, Left upper leg, Left lower leg, Face         Bathing assist Assist Level: Minimal Assistance - Patient > 75%     Upper Body Dressing/Undressing Upper body dressing   What is the patient wearing?: Pull over shirt    Upper body assist Assist Level: Moderate Assistance - Patient 50 - 74%    Lower Body Dressing/Undressing Lower body dressing      What is the patient wearing?: Pants     Lower body assist Assist for lower body dressing: Moderate Assistance - Patient 50 - 74%     Toileting Toileting    Toileting assist Assist for toileting: Minimal Assistance - Patient > 75%     Transfers Chair/bed transfer  Transfers assist     Chair/bed transfer assist level: Minimal Assistance - Patient > 75%     Locomotion Ambulation   Ambulation assist      Assist level: Contact Guard/Touching assist Assistive device: No Device Max distance: 200+   Walk 10 feet activity   Assist     Assist level: Contact Guard/Touching  assist Assistive device: No Device   Walk 50 feet activity   Assist    Assist level: Contact Guard/Touching assist Assistive device: No Device    Walk 150 feet activity   Assist    Assist level: Contact Guard/Touching assist Assistive device: No Device    Walk 10 feet on uneven surface  activity   Assist     Assist level: Contact Guard/Touching assist Assistive device: Other (comment)   Wheelchair     Assist Is the patient using a wheelchair?: No             Wheelchair 50 feet with 2 turns activity    Assist            Wheelchair 150 feet activity     Assist          Blood pressure 109/83, pulse 66, temperature 97.8 F (36.6 C), temperature source Oral, resp. rate 18, height 6\' 1"  (1.854 m), weight 95.4 kg, SpO2 99 %.  Medical Problem List and Plan: 1. 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 Lavonna Monarch, c/b MCA bleeding with [facility]             -patient may shower             -ELOS/Goals: 10-14 days, supervision to Mod I PT/OT; DC date 5/22   -Continue CIR therapies including PT, OT, and SLP    - started e-stim LUE 5/14  - 5/14: Per teams, CGA-Min A transfers and ADLs. Gets overwhelmed and frustrated with L hand functional tasks. Similar for PT. Doing very well overall, good motivation and family support. Needs tub bench at DC.    2.  Antithrombotics: -DVT/anticoagulation:  Pharmaceutical: Heparin 5000U q8h             -antiplatelet therapy: none   3. Pain Management/spasticity: Tylenol and Robaxin as needed; oxycodone 10 mg q 4 hours prn             --Lyrica 100 mg TID for HA   - 5/14: Schedule Tylenol 1000 mg TID; decrease oxycodone to 5 mg Q6H PRN   - 5/15: monitor HA with xyzal as below     -5/17--early flexor tone LUE--continue stretching, splinting,  e-stim of extensors 4. Mood/Behavior/Sleep: LCSW to evaluate and provide emotional support.  History of depressed mood, anxiety, PTSD             -antipsychotic agents: continue Rexulti 2 mg daily             -continue Buspar 30 mg BID  -Melatonin 3mg  QHS   - 5/16: On chart review takes Doxazosin 4 mg QHS at home for PTSD; resumed.   -5/17 will schedule trazodone 50mg  qhs    5. Neuropsych/cognition: This patient is capable of making decisions on his own behalf.   6. Skin/Wound Care: Routine skin care checks             -monitor surgical incision (POD #8 on 5/10)   7. Fluids/Electrolytes/Nutrition: Routine Is and Os. Check CMET in am and daily BMET for 5-7 days.  -01/07/23 Na 133 (see #9), BUN marginally elevated at 24 but Cr WNL, BMP otherwise fairly unremarkable; monitor labs   -01/08/23 BMP stable from yesterday, see below for Na/AKI discussion -5/13: Blood sugars well-controlled despite steroid taper, discontinue CBGs -01/14/23 BMP fairly unremarkable/stable, d/c daily BMPs, has CMP scheduled for Monday  8: Seizure prophylaxis: Continue Trileptal 300 mg BID             --has stable auras w/epigastric rising and mild dissociation.    9: Hyponatremia: To Continue Ure NA for 10 days (end 01/16/23)  --Continue 1 Liter fluid restriction with Gatorade prn -01/07/23 Na 133, monitor on labs -01/08/23 Na slightly lower at 131, cont fluid restriction and regimen for now, monitor on daily labs   5/13: NA continues to downtrend, nephrology consulted, appreciate recommendations; increase Urease and stop Gatorade accommodations for fluid restriction per their recommendations.  Daily BMPs for follow-up.  5/14: NA back to 131, continue nephrology recommendations  5.15: Labs pending this AM. per nephro, when Na is up to 135 can decrease UreNa back to 15 g BID and resume taper as written in d/c instructions from [facility] (15 g BID x 10 days then off)    - 5/16: NA 136; reduce urena to 15 mg BID   -5/17 Na up to 138--continue urena per taper instructions above  -01/14/23 Na 135, has been stable several  days, will recheck monday 10: Post-op craniotomy: continue steroid taper: -Decadron 4 mg for 4 days, 2 mg BID for two days and then 2 mg once daily for one day --appt 05/15 10 am for Rad MRI [phone] and Dr. Burtis Junes office @ 3 pm at cancer center [phone]. -Discussing with social work regarding transportation versus rescheduling     11: Persistent Cough: On Doxycycline X 12 days per recommendations. Tessalon ineffective.  -01/07/23 CXR unremarkable, monitor cough, cont Robitussin PRN  5/13: Persistent; recheck chest x-ray, add flutter valve for secretions 5/14: CXR clear, flutter valve worsening. Can bring in home Xyzal to swap out for Claritin. Added PRN inhaler 5/15: Xyzal started. -- has not brought in from home, waiting. Added flonase and tessalon perles.    5/17 still waiting on Xyzal. Will add scheduled mucinex dm to help with cough/loosening of secretions although it's only a semi-productive cough  -01/14/23 no cough today during visit; monitor 12: Seasonal allergies: continue loratadine--advised to bring Xyzal from home as this works better-- not on formulary here. Continue Flonase--not reordered, defer to primary since he's also on decadron   13: BP goals: SBP between 120-140 to allow for perfusion. If above 140, monitor and do not give PRNs per NS  -  01/07/23 BPs mostly at goal, monitor closely  -01/08/23 BPs at goal since last night, monitor  5/13: Diastolic HTN; decreased steroids today, monitor  5/14-18: MUCH better with steroid taper; monitor  Vitals:   01/10/23 1314 01/10/23 1945 01/11/23 0411 01/11/23 1403  BP: 124/80 (!) 128/102 127/84 128/83   01/11/23 1948 01/12/23 0522 01/12/23 1325 01/12/23 2000  BP: 118/78 118/81 (!) 127/95 124/87   01/13/23 0506 01/13/23 1439 01/13/23 2012 01/14/23 0502  BP: 117/82 119/86 105/78 109/83      14. Constipation: Has resolved with use of Sennakot 2 tabs BID and Miralax 17g BID, monitor for oversoftening  - LBM 5/18, daily BMs,  cont regimen   15. GI prophylaxis: continue Protonix 40mg  QD  16. Sensation of tongue swelling/dysarthria - V2/3 sensory deficits and tongue deviation on exam; discussed likely d/t sensory changes and biting tongue; patient agreeing. Monitor, SLP sees patient.     LOS: 8 days A FACE TO FACE EVALUATION WAS PERFORMED  [address] 01/14/2023, 12:54 PM

## 2023-01-15 DIAGNOSIS — C719 Malignant neoplasm of brain, unspecified: Secondary | ICD-10-CM | POA: Diagnosis not present

## 2023-01-15 DIAGNOSIS — E871 Hypo-osmolality and hyponatremia: Secondary | ICD-10-CM | POA: Diagnosis not present

## 2023-01-15 DIAGNOSIS — I10 Essential (primary) hypertension: Secondary | ICD-10-CM | POA: Diagnosis not present

## 2023-01-15 NOTE — Progress Notes (Signed)
PROGRESS NOTE   Subjective/Complaints:  Pt doing alright again today, slept ok overnight. Reports some mild R retroorbital discomfort today, would like some pain meds, no other associated symptoms with this. LBM yesterday. Urinating fine. Denies any other complaints or concerns.    ROS: Patient denies fever, rash, sore throat, blurred vision, dizziness, nausea, vomiting, diarrhea,  shortness of breath or chest pain, joint or back/neck pain,  or mood change.    Objective:   No results found. No results for input(s): "WBC", "HGB", "HCT", "PLT" in the last 72 hours.  Recent Labs    01/13/23 0650 01/14/23 0615  NA 138 135  K 4.0 3.9  CL 103 103  CO2 25 24  GLUCOSE 94 91  BUN 41* 37*  CREATININE 0.94 0.96  CALCIUM 9.0 8.8*    Intake/Output Summary (Last 24 hours) at 01/15/2023 1144 Last data filed at 01/15/2023 0735 Gross per 24 hour  Intake 1133 ml  Output 1525 ml  Net -392 ml        Physical Exam: Vital Signs Blood pressure 107/81, pulse 72, temperature 97.6 F (36.4 C), resp. rate 19, height 6\' 1"  (1.854 m), weight 95.4 kg, SpO2 96 %.  Constitutional: No distress . Vital signs reviewed. Laying in bed, later seen walking in therapy HEENT: NCAT, EOMI, oral membranes moist Neck: supple Cardiovascular: RRR without murmur. No JVD    Respiratory/Chest: CTA Bilaterally without wheezes or rales. Normal effort. No cough during exam.    GI/Abdomen: BS +, non-tender, non-distended Ext: no clubbing, cyanosis, or edema Psych: pleasant and cooperative, flat affect Skin: C/D/I. No apparent lesions aside from R craniotomy wound, prior ACDF  PRIOR EXAMS: MSK: normal PROM, no jt pain Neuro:       Strength:                RUE: 5/5 SA, 5/5 EF, 5/5 EE, 5/5 WE, 5/5 FF, 5/5 FA                 LUE: 4/5, weak finger extensors, early flexor tone left wrist/finger flexors                RLE: 5/5 HF, 5/5 KE, 5/5 DF, 5/5 EHL, 5/5 PF                  LLE:  5-/5 HF, 5-/5 KE, 5/5 DF, 5/5 EHL, 5/5 PF    :   Cognition: AAO to person, place, time and event.  Ongoing motor delay in LUE > LLE - improving Language: Fluent, No substitutions or neoglisms.   Sensation: To light touch intact in BL UEs and LEs  Reflexes: 2+ in BL UE and LEs. + L Hoffman's  CN: + L facial droop + L  V2/3 sensory loss + L tongue deviation, speech fairly clear Coordination: Fine motor difficulty LUE  .   Assessment/Plan: 1. Functional deficits which require 3+ hours per day of interdisciplinary therapy in a comprehensive inpatient rehab setting. Physiatrist is providing close team supervision and 24 hour management of active medical problems listed below. Physiatrist and rehab team continue to assess barriers to discharge/monitor patient progress toward functional and medical goals  Care  Tool:  Bathing    Body parts bathed by patient: Right arm, Left arm, Chest, Abdomen, Front perineal area, Buttocks, Right upper leg, Right lower leg, Left upper leg, Left lower leg, Face         Bathing assist Assist Level: Minimal Assistance - Patient > 75%     Upper Body Dressing/Undressing Upper body dressing   What is the patient wearing?: Pull over shirt    Upper body assist Assist Level: Moderate Assistance - Patient 50 - 74%    Lower Body Dressing/Undressing Lower body dressing      What is the patient wearing?: Pants     Lower body assist Assist for lower body dressing: Moderate Assistance - Patient 50 - 74%     Toileting Toileting    Toileting assist Assist for toileting: Minimal Assistance - Patient > 75%     Transfers Chair/bed transfer  Transfers assist     Chair/bed transfer assist level: Minimal Assistance - Patient > 75%     Locomotion Ambulation   Ambulation assist      Assist level: Contact Guard/Touching assist Assistive device: No Device Max distance: 200+   Walk 10 feet activity   Assist      Assist level: Contact Guard/Touching assist Assistive device: No Device   Walk 50 feet activity   Assist    Assist level: Contact Guard/Touching assist Assistive device: No Device    Walk 150 feet activity   Assist    Assist level: Contact Guard/Touching assist Assistive device: No Device    Walk 10 feet on uneven surface  activity   Assist     Assist level: Contact Guard/Touching assist Assistive device: Other (comment)   Wheelchair     Assist Is the patient using a wheelchair?: No             Wheelchair 50 feet with 2 turns activity    Assist            Wheelchair 150 feet activity     Assist          Blood pressure 107/81, pulse 72, temperature 97.6 F (36.4 C), resp. rate 19, height 6\' 1"  (1.854 m), weight 95.4 kg, SpO2 96 %.  Medical Problem List and Plan 1. 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 Lavonna Monarch, c/b MCA bleeding with [facility]             -patient may shower             -ELOS/Goals: 10-14 days, supervision to Mod I PT/OT; DC date 5/22   -Continue CIR therapies including PT, OT, and SLP    - started e-stim LUE 5/14  - 5/14: Per teams, CGA-Min A transfers and ADLs. Gets overwhelmed and frustrated with L hand functional tasks. Similar for PT. Doing very well overall, good motivation and family support. Needs tub bench at DC.    2.  Antithrombotics: -DVT/anticoagulation:  Pharmaceutical: Heparin 5000U q8h             -antiplatelet therapy: none   3. Pain Management/spasticity: Tylenol and Robaxin as needed; oxycodone 10 mg q 4 hours prn             --Lyrica 100 mg TID for HA   - 5/14: Schedule Tylenol 1000 mg TID; decrease oxycodone to 5 mg Q6H PRN   - 5/15: monitor HA with xyzal as below     -5/17--early flexor tone  LUE--continue stretching, splinting, e-stim of extensors  -01/15/23 mild R retroorbital HA this morning but no other  symptoms, seemed improved after meds (seen in hallway during therapy) 4. Mood/Behavior/Sleep: LCSW to evaluate and provide emotional support. History of depressed mood, anxiety, PTSD             -antipsychotic agents: continue Rexulti 2 mg daily             -continue Buspar 30 mg BID  -Melatonin 3mg  QHS   - 5/16: On chart review takes Doxazosin 4 mg QHS at home for PTSD; resumed.   -5/17 will schedule trazodone 50mg  qhs    5. Neuropsych/cognition: This patient is capable of making decisions on his own behalf.   6. Skin/Wound Care: Routine skin care checks             -monitor surgical incision (POD #8 on 5/10)   7. Fluids/Electrolytes/Nutrition: Routine Is and Os. Check CMET in am and daily BMET for 5-7 days.  -01/07/23 Na 133 (see #9), BUN marginally elevated at 24 but Cr WNL, BMP otherwise fairly unremarkable; monitor labs   -01/08/23 BMP stable from yesterday, see below for Na/AKI discussion -5/13: Blood sugars well-controlled despite steroid taper, discontinue CBGs -01/14/23 BMP fairly unremarkable/stable, d/c daily BMPs, has CMP scheduled for Monday  8: Seizure prophylaxis: Continue Trileptal 300 mg BID             --has stable auras w/epigastric rising and mild dissociation.    9: Hyponatremia: To Continue Ure NA for 10 days (end 01/16/23)  --Continue 1 Liter fluid restriction with Gatorade prn -01/07/23 Na 133, monitor on labs -01/08/23 Na slightly lower at 131, cont fluid restriction and regimen for now, monitor on daily labs   5/13: NA continues to downtrend, nephrology consulted, appreciate recommendations; increase Urease and stop Gatorade accommodations for fluid restriction per their recommendations.  Daily BMPs for follow-up.  5/14: NA back to 131, continue nephrology recommendations  5.15: Labs pending this AM. per nephro, when Na is up to 135 can decrease UreNa back to 15 g BID and resume taper as written in d/c instructions from [facility] (15 g BID x 10 days then off)    - 5/16: NA  136; reduce urena to 15 mg BID   -5/17 Na up to 138--continue urena per taper instructions above  -01/14/23 Na 135, has been stable several days, will recheck monday 10: Post-op craniotomy: continue steroid taper: -Decadron 4 mg for 4 days, 2 mg BID for two days and then 2 mg once daily for one day-- completed 01/12/23 --appt 05/15 10 am for Rad MRI [phone] and Dr. Burtis Junes office @ 3 pm at [facility] center [phone]. -Discussing with social work regarding transportation versus rescheduling     11: Persistent Cough: On Doxycycline X 12 days per recommendations. Tessalon ineffective.  -01/07/23 CXR unremarkable, monitor cough, cont Robitussin PRN  5/13: Persistent; recheck chest x-ray, add flutter valve for secretions 5/14: CXR clear, flutter valve worsening. Can bring in home Xyzal to swap out for Claritin. Added PRN inhaler 5/15: Xyzal started. -- has not brought in from home, waiting. Added flonase and tessalon perles.    5/17 still waiting on Xyzal. Will add scheduled mucinex dm to help with cough/loosening of secretions although it's only a semi-productive cough  -5/18-19/24 no cough today during visit; monitor 12: Seasonal allergies: continue loratadine--advised to bring Xyzal from home as this works better-- not on formulary here. Continue Flonase   13: BP goals: SBP  between 120-140 to allow for perfusion. If above 140, monitor and do not give PRNs per NS  -01/07/23 BPs mostly at goal, monitor closely  -01/08/23 BPs at goal since last night, monitor  5/13: Diastolic HTN; decreased steroids today, monitor  5/14-19: MUCH better with steroid taper; monitor  Vitals:   01/11/23 1403 01/11/23 1948 01/12/23 0522 01/12/23 1325  BP: 128/83 118/78 118/81 (!) 127/95   01/12/23 2000 01/13/23 0506 01/13/23 1439 01/13/23 2012  BP: 124/87 117/82 119/86 105/78   01/14/23 0502 01/14/23 1424 01/14/23 1952 01/15/23 0625  BP: 109/83 117/81 117/86 107/81      14. Constipation: Has resolved with use  of Sennakot 2 tabs BID and Miralax 17g BID, monitor for oversoftening   - LBM 5/18, daily BMs, cont regimen   15. GI prophylaxis: continue Protonix 40mg  QD  16. Sensation of tongue swelling/dysarthria - V2/3 sensory deficits and tongue deviation on exam; discussed likely d/t sensory changes and biting tongue; patient agreeing. Monitor, SLP sees patient.     LOS: 9 days A FACE TO FACE EVALUATION WAS PERFORMED  [address] [place]horne 01/15/2023, 11:44 AM

## 2023-01-15 NOTE — Progress Notes (Signed)
Physical Therapy Session Note  Patient Details  Name: Shawn York MRN: [id number] Date of Birth: March 06, 1976  Today's Date: 01/15/2023 PT Individual Time: 1100-1200 PT Individual Time Calculation (min): 60 min   Short Term Goals: Week 1:  PT Short Term Goal 1 (Week 1): Patient will complete bed mobility with Supv PT Short Term Goal 2 (Week 1): Patient will ambulate >200' with CGA PT Short Term Goal 3 (Week 1): Patient will ascend/descend 12 steps with CGA  Skilled Therapeutic Interventions/Progress Updates:   Pt received lying supine in bed sleeping. Pt agreeable to tx. No reports of pain, only fatigue from prior therapy.  GT: GT > 300 ft around hospital's 4th floor with PT light CGA and verbal cues for decreasing cadence, concentrating on L foot clearance, and focusing on proper weightshift.  NMR: Front/lateral/retro step tap combo in standing x 15 each with PT CGA and reminders of technique as multidirectional activities are hard for pt to remember. Gt forward and backward with 1 sec pause each LE with retro gait for posterior chain functional balance. Stop/go ambulation performed x 112 ft with PT CGA and pt able to maintain balance with each "stop" command; standing 2 lb dowel chest press x 15 varying speeds for further balance challenge. Standing reaches outside of base of support for further balance challenge varying cadences.  Therex: STS x 10 from mat table with PT CGA and concentration on eccentric descent; 6" frontal step ups x 15; 6" lateral step ups x 15; quad sets x 20; SLR x 15 with 1-2 sec hold for further strengthening.  All STS transfers light CGA; bed mobility MI.  Pt left supine in bed, bed alarm on, call light and cell phone within reach. No c/o pain.       Therapy Documentation Precautions:  Precautions Precautions: Fall Precaution Comments: L hemi, R crani Restrictions Weight Bearing Restrictions: No      Therapy/Group: Individual Therapy  Luna Fuse 01/15/2023, 12:33 PM

## 2023-01-15 NOTE — Progress Notes (Signed)
Occupational Therapy Session Note  Patient Details  Name: Shawn York MRN: [id number] Date of Birth: [birth year]  Today's Date: 01/15/2023 OT Individual Time: 0100-0200 OT Individual Time Calculation (min): 60 min    Short Term Goals: Week 1:  OT Short Term Goal 1 (Week 1): Pt will groom in standing with CGA OT Short Term Goal 1 - Progress (Week 1): Met OT Short Term Goal 2 (Week 1): Pt will don shirt wiht MIN A OT Short Term Goal 2 - Progress (Week 1): Met OT Short Term Goal 3 (Week 1): PT will complete TTB wiht CGA OT Short Term Goal 3 - Progress (Week 1): Met OT Short Term Goal 4 (Week 1): Pt will demo active digit extension to demo improved NMR OT Short Term Goal 4 - Progress (Week 1): Met  Skilled Therapeutic Interventions/Progress Updates:    Patient in bed at the time of arrival, in agreement with completing UB exercise to improve functional outcome.  The went on to  indicate that he rested well last with no pain to report at the time of treatment.   The pt was able to transfer from bed LOF to EOB with  CGA.    The pt was able to transfer to the w/c with CGA.  The pt was transported to the gym and was able to use the 1lb dowel to complete 1 sets of 10 for shld flexion, horizontal abduction and shld rotation to improve functional mobility of BUE. The pt was then asked to remove suction peg from the miror with rest breaks as needed, the pt rquired 3 rest breaks. The pt completed the session by performing the Scifit to improve communication to his RUE for gains with mobility with AROM and intermittent hand over hand for placement.  At the end of the session the pt returned to his room and transferred from w/c LOF to bed level with CGA.  The pt  was able to manage BLE for bed LOF with SBA. The call light and bed side table were both within reach and all additional needs were addressed.   Therapy Documentation Precautions:  Precautions Precautions: Fall Precaution Comments: L hemi, R  crani Restrictions Weight Bearing Restrictions: No  Therapy/Group: Individual Therapy  Lavona Mound 01/15/2023, 2:06 PM

## 2023-01-15 NOTE — Progress Notes (Signed)
Occupational Therapy Session Note  Patient Details  Name: Shawn York MRN: [id number] Date of Birth: 08-Feb-1976  Today's Date: 01/15/2023 OT Individual Time: 4098-1191 OT Individual Time Calculation (min): 70 min    Short Term Goals: Week 2:  OT Short Term Goal 1 (Week 2): STG=LTG d/t ELOS  Skilled Therapeutic Interventions/Progress Updates:  Pt received resting in bed for skilled OT session with focus on BADL, LUE NMR, and functional mobility. Pt agreeable to interventions, despite demonstrating overall flat mood/affect. Pt with no reports of pain. OT offering intermediate rest breaks and positioning suggestions throughout session to address potential pain/fatigue and maximize participation/safety in session.   Pt completes full-shower with setup A, able to integrate RUE into tasks without cuing. Pt dresses with supervision, cuing required for orientation of clothing.   Pt ambulates from room<>main therapy gym with close supervision, CGA needed due to x2 LOB around corners.  In therapy gym, pt participates in series of FM tasks with LUE, including: -Peg board -Tan thera-putty hoop pulls -Tan thera-putty gross grip and finger extension  Pt then participates in functional mobility activity, reaching towards items on low surfaces and/or floor. Pt completes activity with no more than close supervision, managing obstacles without LOB.   Pt remained resting in bed with all immediate needs met at end of session. Pt continues to be appropriate for skilled OT intervention to promote further functional independence.    Therapy Documentation Precautions:  Precautions Precautions: Fall Precaution Comments: L hemi, R crani Restrictions Weight Bearing Restrictions: No   Therapy/Group: Individual Therapy  Lou Cal, OTR/L, MSOT  01/15/2023, 5:40 AM

## 2023-01-16 DIAGNOSIS — C712 Malignant neoplasm of temporal lobe: Secondary | ICD-10-CM | POA: Diagnosis not present

## 2023-01-16 LAB — CBC WITH DIFFERENTIAL/PLATELET
Abs Immature Granulocytes: 0.03 10*3/uL (ref 0.00–0.07)
Basophils Absolute: 0 10*3/uL (ref 0.0–0.1)
Basophils Relative: 1 %
Eosinophils Absolute: 0.1 10*3/uL (ref 0.0–0.5)
Eosinophils Relative: 1 %
HCT: 34 % — ABNORMAL LOW (ref 39.0–52.0)
Hemoglobin: 11.7 g/dL — ABNORMAL LOW (ref 13.0–17.0)
Immature Granulocytes: 1 %
Lymphocytes Relative: 46 %
Lymphs Abs: 2 10*3/uL (ref 0.7–4.0)
MCH: 34 pg (ref 26.0–34.0)
MCHC: 34.4 g/dL (ref 30.0–36.0)
MCV: 98.8 fL (ref 80.0–100.0)
Monocytes Absolute: 0.4 10*3/uL (ref 0.1–1.0)
Monocytes Relative: 9 %
Neutro Abs: 1.7 10*3/uL (ref 1.7–7.7)
Neutrophils Relative %: 42 %
Platelets: 159 10*3/uL (ref 150–400)
RBC: 3.44 MIL/uL — ABNORMAL LOW (ref 4.22–5.81)
RDW: 14 % (ref 11.5–15.5)
WBC: 4.1 10*3/uL (ref 4.0–10.5)
nRBC: 0 % (ref 0.0–0.2)

## 2023-01-16 LAB — COMPREHENSIVE METABOLIC PANEL
ALT: 40 U/L (ref 0–44)
AST: 22 U/L (ref 15–41)
Albumin: 3.4 g/dL — ABNORMAL LOW (ref 3.5–5.0)
Alkaline Phosphatase: 44 U/L (ref 38–126)
Anion gap: 8 (ref 5–15)
BUN: 33 mg/dL — ABNORMAL HIGH (ref 6–20)
CO2: 23 mmol/L (ref 22–32)
Calcium: 8.6 mg/dL — ABNORMAL LOW (ref 8.9–10.3)
Chloride: 106 mmol/L (ref 98–111)
Creatinine, Ser: 0.88 mg/dL (ref 0.61–1.24)
GFR, Estimated: >60 mL/min (ref >60)
Glucose, Bld: 102 mg/dL — ABNORMAL HIGH (ref 70–99)
Potassium: 3.9 mmol/L (ref 3.5–5.1)
Sodium: 137 mmol/L (ref 135–145)
Total Bilirubin: 0.1 mg/dL — ABNORMAL LOW (ref 0.3–1.2)
Total Protein: 5.8 g/dL — ABNORMAL LOW (ref 6.5–8.1)

## 2023-01-16 NOTE — Progress Notes (Signed)
Occupational Therapy Session Note  Patient Details  Name: Shawn York MRN: [id number] Date of Birth: April 24, 1976  Today's Date: 01/16/2023 Session 1 OT Individual Time: 0832-0900 OT Individual Time Calculation (min): 28 min   Session 2 OT Individual Time: 1030-1100 OT Individual Time Calculation (min): 30 min    Short Term Goals: Week 2:  OT Short Term Goal 1 (Week 2): STG=LTG d/t ELOS  Skilled Therapeutic Interventions/Progress Updates:    Session 1 Pt greeted seated EOB with spouse present for family education. Education provided regarding safe BADL performance in home environment, home modifications, DME needs, L U NMR, SAEBO, energy conservation techniques, and safety awareness.   Pt ambulated to therapy apartment and completed tub bench transfer with supervision. UB there-ex using 1 lb weight bar chest press, bicep curl, straight arm raise. Pt ambulated back to room w/ supervision and left seated in recliner with wife present and needs met.   Session 2 Pt greeted seated in recliner and agreeable to OT treatment session focused on L UE NMR. PT ambulated to therapy gym without device and close supervision. OT brought pt into quadruped to force weight bearing through L UE for neuro re-ed. Pt completed card matching task while in quadruped. Pt needed two rest breaks sitting back on knees to complete task. Difficulty with dual task of answering OT questions while maintaining attention to task. Pt ambulated back to room and e-stim set-up on wrist extensors. Pt left semi-reclined in bed with e-stim on, call bell in reach, and needs met.  Ratio 1:1 Rate 35 pps Waveform- Asymmetric Ramp 1.0 Pulse 300 CH1 Intensity- 30  Duration -  60  No pain   Therapy Documentation Precautions:  Precautions Precautions: Fall Precaution Comments: L hemi, R crani Restrictions Weight Bearing Restrictions: No Pain: Pain Assessment Pain Scale: 0-10 Pain Score: 0-No pain   Therapy/Group:  Individual Therapy  Mal Amabile 01/16/2023, 11:06 AM

## 2023-01-16 NOTE — Progress Notes (Signed)
Speech Language Pathology Daily Session Note  Patient Details  Name: Shawn York MRN: [id number] Date of Birth: 02/17/1976  Today's Date: 01/16/2023 SLP Individual Time: 0900-0925 SLP Individual Time Calculation (min): 25 min  Short Term Goals: Week 1: SLP Short Term Goal 1 (Week 1): STGs=LTGs due to ELOS  Skilled Therapeutic Interventions: Skilled treatment session focused on completion of education with the patient and his wife. SLP facilitated session by providing education regarding strategies to utilize at home to maximize short-term recall and overall safety. SLP reinforced the importance of initially having 24 hour supervision to ensure safe decision making and carryover of functional information. SLP also provided activities/tasks patient can participate in at home for cognitive remediation. Both the patient and his wife verbalized understanding and handouts were given to reinforce education. Patient left upright in recliner with wife present. Continue with current plan of care.      Pain Pain Assessment Pain Scale: 0-10 Pain Score: 0-No pain  Therapy/Group: Individual Therapy  Shawn York 01/16/2023, 9:25 AM

## 2023-01-16 NOTE — Progress Notes (Signed)
Physical Therapy Session Note  Patient Details  Name: Shawn York MRN: [id number] Date of Birth: 06/21/1976  Today's Date: 01/16/2023 PT Individual Time: [phone] and 1315-1415 PT Individual Time Calculation (min): 27 min and 60 min  Short Term Goals: Week 1:  PT Short Term Goal 1 (Week 1): Patient will complete bed mobility with Supv PT Short Term Goal 2 (Week 1): Patient will ambulate >200' with CGA PT Short Term Goal 3 (Week 1): Patient will ascend/descend 12 steps with CGA  Skilled Therapeutic Interventions/Progress Updates:     1st Session: Pt received seated in recliner and agrees to therapy. Wife present for family education and pt does not report any pain. PT provides update on pt's progress with therapy thus far and provides recommendations for safe mobility following discharge. Pt performs all mobility during session with CGA. Pt ambulates x200' to ortho gym with PT providing cues for upright gaze to improve posture and balance. Pt performs car transfer and ramp navigation with cues for sequencing. Following, pt ambulates to main gym and completes x8 6" steps without handrail, requiring cues for safe step sequencing. PT also provides education to wife regarding safe guarding of pt when completing stairs or while ambulating. Pt's wife provides CGA and pt ambulates back to room. Left seated in recliner with all needs within reach.   2nd Session: Pt misses 15 minutes of skilled PT due to finishing lunch. Upon return, pt supine in bed and agrees to therapy. No complaint of pain. Bed mobility independent. Pt performs sit to stand with cues for initiation. Pt ambulates x200' to dayroom with close supervision and cues for upright gaze to improve balance, and no LOBs. Pt completes Nustep for NMR of L hemibody, with specific instruction of attending to L grip on handle. Pt completes x10:00 at workload of 3 with average steps per minute ~30. Pt demonstrates much improved ability to maintain grip  with L hand. Pt typically maintains grip for ~1:00 at a time and then has to re-grip. PT provides cues for hand and foot placement and occasional cues to attend to L hand.   Pt completes Wii golf activity to challenge dynamic balance, as well as provide gross and fine motor challenge with pt tasked with perform large, dynamic movements in multiple planes, while also incorporating visual stimuli. Pt performs multiple reps of sit to stand during activity, with PT providing cues for safety. Pt noted to have slight L lateral lean throughout but no LOBs.   Pt ambulates back to room. Left supine in bed with all needs within reach.   Therapy Documentation Precautions:  Precautions Precautions: Fall Precaution Comments: L hemi, R crani Restrictions Weight Bearing Restrictions: No  Therapy/Group: Individual Therapy  Beau Fanny, PT, DPT 01/16/2023, 5:02 PM

## 2023-01-16 NOTE — Progress Notes (Signed)
PROGRESS NOTE   Subjective/Complaints:  No acute compaints. No events ovenright. No further HA, cough improved, doing well.    ROS: Patient denies fever, rash, sore throat, blurred vision, dizziness, nausea, vomiting, diarrhea,  shortness of breath or chest pain, joint or back/neck pain,  or mood change.    Objective:   No results found. Recent Labs    01/16/23 0526  WBC 4.1  HGB 11.7*  HCT 34.0*  PLT 159    Recent Labs    01/14/23 0615 01/16/23 0526  NA 135 137  K 3.9 3.9  CL 103 106  CO2 24 23  GLUCOSE 91 102*  BUN 37* 33*  CREATININE 0.96 0.88  CALCIUM 8.8* 8.6*     Intake/Output Summary (Last 24 hours) at 01/16/2023 0842 Last data filed at 01/16/2023 0520 Gross per 24 hour  Intake 240 ml  Output 900 ml  Net -660 ml         Physical Exam: Vital Signs Blood pressure (!) 112/90, pulse 74, temperature 97.8 F (36.6 C), temperature source Oral, resp. rate 18, height 6\' 1"  (1.854 m), weight 95.4 kg, SpO2 99 %.  Constitutional: No distress . Vital signs reviewed. Standing working with OT.  HEENT: NCAT, EOMI, oral membranes moist Neck: supple Cardiovascular: RRR without murmur. No JVD    Respiratory/Chest: CTA Bilaterally without wheezes or rales. Normal effort. No cough during exam.    GI/Abdomen: BS +, non-tender, non-distended Psych: pleasant and cooperative, flat affect Skin: C/D/I. No apparent lesions aside from R craniotomy wound, prior ACDF MSK: normal PROM, no jt pain Neuro:       Strength:                RUE: 5/5 SA, 5/5 EF, 5/5 EE, 5/5 WE, 5/5 FF, 5/5 FA                 LUE: 4+/5, weak finger extensors, early flexor tone left wrist/finger flexors - improved                RLE: 5/5 HF, 5/5 KE, 5/5 DF, 5/5 EHL, 5/5 PF                 LLE:  5-/5 HF, 5-/5 KE, 5/5 DF, 5/5 EHL, 5/5 PF    :   Cognition: AAO to person, place, time and event.  Ongoing motor delay in LUE > LLE -  improving Language: Fluent, No substitutions or neoglisms.   Sensation: To light touch intact in BL UEs and LEs  Reflexes: 2+ in BL UE and LEs. + L Hoffman's  CN: + L facial droop + L  V2/3 sensory loss + L tongue deviation, speech fairly clear Coordination: Fine motor difficulty LUE  .   Assessment/Plan: 1. Functional deficits which require 3+ hours per day of interdisciplinary therapy in a comprehensive inpatient rehab setting. Physiatrist is providing close team supervision and 24 hour management of active medical problems listed below. Physiatrist and rehab team continue to assess barriers to discharge/monitor patient progress toward functional and medical goals  Care Tool:  Bathing    Body parts bathed by patient: Right arm, Left arm, Chest, Abdomen, Front perineal  area, Buttocks, Right upper leg, Right lower leg, Left upper leg, Left lower leg, Face         Bathing assist Assist Level: Minimal Assistance - Patient > 75%     Upper Body Dressing/Undressing Upper body dressing   What is the patient wearing?: Pull over shirt    Upper body assist Assist Level: Moderate Assistance - Patient 50 - 74%    Lower Body Dressing/Undressing Lower body dressing      What is the patient wearing?: Pants     Lower body assist Assist for lower body dressing: Moderate Assistance - Patient 50 - 74%     Toileting Toileting    Toileting assist Assist for toileting: Minimal Assistance - Patient > 75%     Transfers Chair/bed transfer  Transfers assist     Chair/bed transfer assist level: Minimal Assistance - Patient > 75%     Locomotion Ambulation   Ambulation assist      Assist level: Contact Guard/Touching assist Assistive device: No Device Max distance: 200+   Walk 10 feet activity   Assist     Assist level: Contact Guard/Touching assist Assistive device: No Device   Walk 50 feet activity   Assist    Assist level: Contact Guard/Touching  assist Assistive device: No Device    Walk 150 feet activity   Assist    Assist level: Contact Guard/Touching assist Assistive device: No Device    Walk 10 feet on uneven surface  activity   Assist     Assist level: Contact Guard/Touching assist Assistive device: Other (comment)   Wheelchair     Assist Is the patient using a wheelchair?: No             Wheelchair 50 feet with 2 turns activity    Assist            Wheelchair 150 feet activity     Assist          Blood pressure (!) 112/90, pulse 74, temperature 97.8 F (36.6 C), temperature source Oral, resp. rate 18, height 6\' 1"  (1.854 m), weight 95.4 kg, SpO2 99 %.  Medical Problem List and Plan 1. 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 Lavonna Monarch, c/b MCA bleeding with [facility]             -patient may shower             -ELOS/Goals: 10-14 days, supervision to Mod I PT/OT; DC date 5/22   -Continue CIR therapies including PT, OT, and SLP    - started e-stim LUE 5/14  - 5/14: Per teams, CGA-Min A transfers and ADLs. Gets overwhelmed and frustrated with L hand functional tasks. Similar for PT. Doing very well overall, good motivation and family support. Needs tub bench at DC.    2.  Antithrombotics: -DVT/anticoagulation:  Pharmaceutical: Heparin 5000U q8h             -antiplatelet therapy: none   3. Pain Management/spasticity: Tylenol and Robaxin as needed; oxycodone 10 mg q 4 hours prn             --Lyrica 100 mg TID for HA   - 5/14: Schedule Tylenol 1000 mg TID; decrease oxycodone to 5 mg Q6H PRN   - 5/15: monitor HA with xyzal as below     -5/17--early flexor tone LUE--continue stretching, splinting, e-stim of extensors  -01/15/23 mild R retroorbital HA this morning but  no other symptoms, seemed improved after meds (seen in hallway during therapy)  5/20: No HA, monitor  4. Mood/Behavior/Sleep: LCSW to  evaluate and provide emotional support. History of depressed mood, anxiety, PTSD             -antipsychotic agents: continue Rexulti 2 mg daily             -continue Buspar 30 mg BID  -Melatonin 3mg  QHS   - 5/16: On chart review takes Doxazosin 4 mg QHS at home for PTSD; resumed.   -5/17 will schedule trazodone 50mg  qhs    5. Neuropsych/cognition: This patient is capable of making decisions on his own behalf.   6. Skin/Wound Care: Routine skin care checks             -monitor surgical incision (POD #8 on 5/10)   7. Fluids/Electrolytes/Nutrition: Routine Is and Os. Check CMET in am and daily BMET for 5-7 days.  -01/07/23 Na 133 (see #9), BUN marginally elevated at 24 but Cr WNL, BMP otherwise fairly unremarkable; monitor labs   -01/08/23 BMP stable from yesterday, see below for Na/AKI discussion -5/13: Blood sugars well-controlled despite steroid taper, discontinue CBGs -01/14/23 BMP fairly unremarkable/stable, d/c daily BMPs, has CMP scheduled for Monday - see below  8: Seizure prophylaxis: Continue Trileptal 300 mg BID             --has stable auras w/epigastric rising and mild dissociation.    9: Hyponatremia: To Continue Ure NA for 10 days (end 01/16/23)  --Continue 1 Liter fluid restriction with Gatorade prn -01/07/23 Na 133, monitor on labs -01/08/23 Na slightly lower at 131, cont fluid restriction and regimen for now, monitor on daily labs   5/13: NA continues to downtrend, nephrology consulted, appreciate recommendations; increase Urease and stop Gatorade accommodations for fluid restriction per their recommendations.  Daily BMPs for follow-up.  5/14: NA back to 131, continue nephrology recommendations  5.15: Labs pending this AM. per nephro, when Na is up to 135 can decrease UreNa back to 15 g BID and resume taper as written in d/c instructions from [facility] (15 g BID x 10 days then off)    - 5/16: NA 136; reduce urena to 15 mg BID   -5/17 Na up to 138--continue urena per taper  instructions above  -01/14/23 Na 135, has been stable several days, will recheck Monday  5/20: Na 137 - Urease Dced; monitor tomorrow AM. Discussed with patient and wife maintaining fluid restriction on DC.   10: Post-op craniotomy: continue steroid taper: -Decadron 4 mg for 4 days, 2 mg BID for two days and then 2 mg once daily for one day-- completed 01/12/23 --appt 05/15 10 am for Rad MRI [phone] and Dr. Burtis Junes office @ 3 pm at cancer center [phone]. -Discussing with social work regarding transportation versus rescheduling     11: Persistent Cough: On Doxycycline X 12 days per recommendations. Tessalon ineffective.  -01/07/23 CXR unremarkable, monitor cough, cont Robitussin PRN  5/13: Persistent; recheck chest x-ray, add flutter valve for secretions 5/14: CXR clear, flutter valve worsening. Can bring in home Xyzal to swap out for Claritin. Added PRN inhaler 5/15: Xyzal started. -- has not brought in from home, waiting. Added flonase and tessalon perles.    5/17 still waiting on Xyzal. Will add scheduled mucinex dm to help with cough/loosening of secretions although it's only a semi-productive cough  -5/18-20/24 no cough today during visit; monitor  12: Seasonal allergies: continue loratadine--advised to bring Xyzal from  home as this works better-- not on formulary here. Continue Flonase   13: BP goals: SBP between 120-140 to allow for perfusion. If above 140, monitor and do not give PRNs per NS  -01/07/23 BPs mostly at goal, monitor closely  -01/08/23 BPs at goal since last night, monitor  5/13: Diastolic HTN; decreased steroids today, monitor  5/14-20: MUCH better with steroid taper; monitor  Vitals:   01/12/23 1325 01/12/23 2000 01/13/23 0506 01/13/23 1439  BP: (!) 127/95 124/87 117/82 119/86   01/13/23 2012 01/14/23 0502 01/14/23 1424 01/14/23 1952  BP: 105/78 109/83 117/81 117/86   01/15/23 0625 01/15/23 1421 01/15/23 1946 01/16/23 0518  BP: 107/81 117/81 105/77 (!)  112/90      14. Constipation: Has resolved with use of Sennakot 2 tabs BID and Miralax 17g BID, monitor for oversoftening   - LBM 5/18, daily BMs, cont regimen   15. GI prophylaxis: continue Protonix 40mg  QD  16. Sensation of tongue swelling/dysarthria - V2/3 sensory deficits and tongue deviation on exam; discussed likely d/t sensory changes and biting tongue; patient agreeing. Monitor, SLP sees patient.     LOS: 10 days A FACE TO FACE EVALUATION WAS PERFORMED  Angelina Sheriff 01/16/2023, 8:42 AM

## 2023-01-16 NOTE — Progress Notes (Signed)
Speech Language Pathology Daily Session Note  Patient Details  Name: Shawn York MRN: [id number] Date of Birth: 10/13/1975  Today's Date: 01/16/2023 SLP Individual Time: 1445-1530 SLP Individual Time Calculation (min): 45 min  Short Term Goals: Week 1: SLP Short Term Goal 1 (Week 1): STGs=LTGs due to ELOS  Skilled Therapeutic Interventions:  Pt was seen in PM to address cognitive re- training. Pt was alert and seen at bedside upon SLP arrival. SLP trained pt in WRAP compensatory strategies including providing examples of utilization. Thoughtful discussion completed on use of external aids with focus on use of his phone for notes, scheduling, and time management. Pt agreeable to idea and reports some use of his phone as an external aid but admits he probably under utilizes them. SLP challenged pt in implementing strategies during structured task. Given a moderate level paragraph presented verbally, SLP guided pt in utilization of association and repetition strategies. Given a 10 minute delay, he recalled information with 100% acc indep. Pt was left at bedside with call button within reach and bed alarm active. SLP to continue POC.   Pain Pain Assessment Pain Scale: 0-10 Pain Score: 0-No pain  Therapy/Group: Individual Therapy  Renaee Munda 01/16/2023, 3:40 PM

## 2023-01-17 ENCOUNTER — Other Ambulatory Visit (HOSPITAL_COMMUNITY): Payer: Self-pay

## 2023-01-17 ENCOUNTER — Inpatient Hospital Stay (HOSPITAL_COMMUNITY): Payer: BC Managed Care – PPO

## 2023-01-17 LAB — BASIC METABOLIC PANEL
Anion gap: 7 (ref 5–15)
BUN: 22 mg/dL — ABNORMAL HIGH (ref 6–20)
CO2: 25 mmol/L (ref 22–32)
Calcium: 8.6 mg/dL — ABNORMAL LOW (ref 8.9–10.3)
Chloride: 108 mmol/L (ref 98–111)
Creatinine, Ser: 0.91 mg/dL (ref 0.61–1.24)
GFR, Estimated: >60 mL/min (ref >60)
Glucose, Bld: 101 mg/dL — ABNORMAL HIGH (ref 70–99)
Potassium: 4 mmol/L (ref 3.5–5.1)
Sodium: 140 mmol/L (ref 135–145)

## 2023-01-17 MED ORDER — SENNOSIDES-DOCUSATE SODIUM 8.6-50 MG PO TABS: 2.0000 | ORAL_TABLET | Freq: Two times a day (BID) | ORAL | Status: DC

## 2023-01-17 MED ORDER — ACETAMINOPHEN 500 MG PO TABS: 1000.0000 mg | ORAL_TABLET | Freq: Three times a day (TID) | ORAL | 0 refills | Status: DC | PRN

## 2023-01-17 MED ORDER — PREGABALIN 100 MG PO CAPS
100.0000 mg | ORAL_CAPSULE | Freq: Three times a day (TID) | ORAL | 0 refills | Status: DC
  Filled 2023-01-17: qty 90, 30d supply, fill #0

## 2023-01-17 MED ORDER — NAPHAZOLINE-GLYCERIN 0.012-0.25 % OP SOLN: 1.0000 [drp] | Freq: Three times a day (TID) | OPHTHALMIC | Status: DC

## 2023-01-17 MED ORDER — FLUTICASONE PROPIONATE 50 MCG/ACT NA SUSP: 1.0000 | Freq: Every day | NASAL | 2 refills | Status: DC

## 2023-01-17 MED ORDER — GADOBUTROL 1 MMOL/ML IV SOLN
9.0000 mL | Freq: Once | INTRAVENOUS | Status: AC | PRN
  Administered 2023-01-17: 9 mL via INTRAVENOUS

## 2023-01-17 MED ORDER — DM-GUAIFENESIN ER 30-600 MG PO TB12: 1.0000 | ORAL_TABLET | Freq: Two times a day (BID) | ORAL | Status: DC | PRN

## 2023-01-17 MED ORDER — POLYETHYLENE GLYCOL 3350 17 G PO PACK: 17.0000 g | PACK | Freq: Every day | ORAL | 0 refills | Status: DC | PRN

## 2023-01-17 MED ORDER — OXYCODONE HCL 5 MG PO TABS
5.0000 mg | ORAL_TABLET | Freq: Four times a day (QID) | ORAL | 0 refills | Status: DC | PRN
  Filled 2023-01-17: qty 20, 5d supply, fill #0

## 2023-01-17 MED ORDER — BENZONATATE 100 MG PO CAPS
100.0000 mg | ORAL_CAPSULE | Freq: Two times a day (BID) | ORAL | 0 refills | Status: DC
  Filled 2023-01-17: qty 20, 10d supply, fill #0

## 2023-01-17 MED ORDER — POLYVINYL ALCOHOL 1.4 % OP SOLN: 1.0000 [drp] | Freq: Three times a day (TID) | OPHTHALMIC | 0 refills | Status: DC | PRN

## 2023-01-17 MED ORDER — TRAZODONE HCL 50 MG PO TABS
50.0000 mg | ORAL_TABLET | Freq: Every day | ORAL | 0 refills | Status: DC
  Filled 2023-01-17: qty 30, 30d supply, fill #0

## 2023-01-17 NOTE — Plan of Care (Signed)
  Problem: RH Memory Goal: LTG Patient will demonstrate ability for day to day (SLP) Description: LTG:   Patient will demonstrate ability for day to day recall/carryover during cognitive/linguistic activities with assist  (SLP) Outcome: Completed/Met Goal: LTG Patient will use memory compensatory aids to (SLP) Description: LTG:  Patient will use memory compensatory aids to recall biographical/new, daily complex information with cues (SLP) Outcome: Completed/Met

## 2023-01-17 NOTE — Patient Care Conference (Signed)
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 Elijah Birk Social Worker Present: [name], LCSWA Nurse Present: Vedia Pereyra, RN PT Present: Malachi Pro, PT OT Present: Kearney Hard, OT SLP Present: Feliberto Gottron, SLP PPS Coordinator present : Fae Pippin, SLP     Current Status/Progress Goal Weekly Team Focus  Bowel/Bladder   Pt is continent of bowel/bladder   Pt will remain continent of bowel/bladder   Will assess qshift and PRN    Swallow/Nutrition/ Hydration               ADL's   supervision overal   SBA-Mod I   self-care retraining, activity tolernace, pt/family education, L NMR, NMES    Mobility   independent bed mobility, supervision transfers and ambulation, CGA stair training   supervision  DC prep    Communication                Safety/Cognition/ Behavioral Observations  Mod I   Mod I   Family education complete and patient ready for discharge    Pain   Pt currently denies pain   Pt will continue to deny pain   Will assess qhsift and PRN    Skin   Pt's incision ot right side of head is healing   Pt's incision will continue to heal  Will assess qshift and PRN      Discharge Planning:  Pt will d/c to home with support from his wife and parents as wife works during the day. Pt setup for  outpatient PT/OT/SLp with [facility] location. Fam edu completed on 5/20 with pt wife. SW will confirm there are no barriers to discharge.   Team Discussion: Oligodendroglioma. Hyponatremia stable. Reg diet/thin liquid with fluid restriction. Sleeping better. Cough has improved. Pain has improved. MRI today. Staples removed. Family education completed.  Will continue outpatient speech for baseline memory issues.  Patient on target to meet rehab goals: yes, at goal level and ready for discharge.   *See Care Plan and progress notes for long and short-term goals.   Revisions to Treatment Plan:  Monitor labs/VS  Teaching Needs: Medications, safety, self care, gait/transfer training, skin care, etc   Current Barriers to Discharge: Decreased caregiver support and Wound care  Possible Resolutions to Barriers: Family education completed. Monitor incision for S/S of infection, order recommended DME if any needed.      Medical Summary Current Status: medically complicated by hyponatremia, hypertension, headache, and cough  Barriers to Discharge: Behavior/Mood;Electrolyte abnormality;Medical stability;Self-care education  Barriers to Discharge Comments: monitor Na while weaning urease, control HA, Possible Resolutions to [employer] Focus: adjusting medications to address hyponatremia, fluir restrictions, and pain medication titration for headache   Continued Need for Acute Rehabilitation Level of Care: The patient requires daily medical management by a physician with specialized training in physical medicine  and rehabilitation for the following reasons: Direction of a multidisciplinary physical rehabilitation program to maximize functional independence : Yes Medical management of patient stability for increased activity during participation in an intensive rehabilitation regime.: Yes Analysis of laboratory values and/or radiology reports with any subsequent need for  medication adjustment and/or medical intervention. : Yes   I attest that I was present, lead the team conference, and concur with the assessment and plan of the team.   Jearld Adjutant 01/17/2023, 2:17 PM

## 2023-01-17 NOTE — Progress Notes (Signed)
Patient returned to the unit stable. VS WNL.    Tilden Dome, LPN

## 2023-01-17 NOTE — Progress Notes (Signed)
Patient ID: Shawn York, male   DOB: 1976-01-08, 47 y.o.   MRN: [id number]  1240- SW spoke with [name] to provide updates from team conference on gains made, and d/c date remains 5/22. Confirms that e-stem arrived at the home and shower seat as well. States she has already received updates from [facility] and pt has an OT appt next week. No questions/concerns reported.   *SW met with pt in room to discuss above. No questions/concerns reported.   [name], MSW, LCSWA Office: [phone] Cell: [phone] Fax: [phone]

## 2023-01-17 NOTE — Plan of Care (Signed)
  Problem: Consults Goal: RH STROKE PATIENT EDUCATION Description: See Patient Education module for education specifics  Outcome: Progressing   Problem: RH BOWEL ELIMINATION Goal: RH STG MANAGE BOWEL WITH ASSISTANCE Description: STG Manage Bowel with min Assistance. Outcome: Progressing Goal: RH STG MANAGE BOWEL W/MEDICATION W/ASSISTANCE Description: STG Manage Bowel with Medication with in Assistance. Outcome: Progressing   Problem: RH SKIN INTEGRITY Goal: RH STG SKIN FREE OF INFECTION/BREAKDOWN Description: Skin will remain intact and free of breakdown. Incision will remain intact and free of infection with min assist Outcome: Progressing Goal: RH STG MAINTAIN SKIN INTEGRITY WITH ASSISTANCE Description: STG Maintain Skin Integrity With min Assistance. Outcome: Progressing   Problem: RH SAFETY Goal: RH STG ADHERE TO SAFETY PRECAUTIONS W/ASSISTANCE/DEVICE Description: STG Adhere to Safety Precautions With cuing Assistance/Device. Outcome: Progressing   Problem: RH COGNITION-NURSING Goal: RH STG ANTICIPATES NEEDS/CALLS FOR ASSIST W/ASSIST/CUES Description: STG Anticipates Needs/Calls for Assist Mod I Assistance/Cues. Outcome: Progressing   Problem: RH PAIN MANAGEMENT Goal: RH STG PAIN MANAGED AT OR BELOW PT'S PAIN GOAL Description: Pain will be less than 4 out of 10 on pain scale with PRN medications min assist Outcome: Progressing   Problem: RH KNOWLEDGE DEFICIT Goal: RH STG INCREASE KNOWLEDGE OF HYPERTENSION Description: Patient/caregiver will be able to manage medications and self care independently from nursing education, nursing handouts and other resources.  Outcome: Progressing   Problem: RH SAFETY Goal: RH STG DECREASED RISK OF FALL WITH ASSISTANCE Description: STG Decreased Risk of Fall With min Assistance. Outcome: Progressing

## 2023-01-17 NOTE — Progress Notes (Signed)
PROGRESS NOTE   Subjective/Complaints:  No acute compaints. No events ovenright.  Asking for IV removal and when staples can be removed. Feeling good about discharge. No further cough, HA. Sleeping well.    ROS: Patient denies fever, rash, sore throat, blurred vision, dizziness, nausea, vomiting, diarrhea,  shortness of breath or chest pain, joint or back/neck pain,  or mood change.    Objective:   No results found. Recent Labs    01/16/23 0526  WBC 4.1  HGB 11.7*  HCT 34.0*  PLT 159     Recent Labs    01/16/23 0526 01/17/23 0558  NA 137 140  K 3.9 4.0  CL 106 108  CO2 23 25  GLUCOSE 102* 101*  BUN 33* 22*  CREATININE 0.88 0.91  CALCIUM 8.6* 8.6*     Intake/Output Summary (Last 24 hours) at 01/17/2023 0926 Last data filed at 01/17/2023 0851 Gross per 24 hour  Intake 680 ml  Output 1175 ml  Net -495 ml         Physical Exam: Vital Signs Blood pressure 130/89, pulse 68, temperature 98.1 F (36.7 C), temperature source Oral, resp. rate 18, height 6\' 1"  (1.854 m), weight 95.4 kg, SpO2 100 %.  Constitutional: No distress . Vital signs reviewed. Sitting up at bedside.  HEENT: NCAT, EOMI, oral membranes moist Neck: supple Cardiovascular: RRR without murmur. No JVD    Respiratory/Chest: CTA Bilaterally without wheezes or rales. Normal effort. No cough during exam.    GI/Abdomen: BS +, non-tender, non-distended Psych: pleasant and cooperative, flat affect Skin: C/D/I. No apparent lesions aside from R craniotomy wound, well approximated, prior ACDF MSK: normal PROM, no jt pain Neuro:       Strength:                RUE: 5/5 SA, 5/5 EF, 5/5 EE, 5/5 WE, 5/5 FF, 5/5 FA                 LUE: 4+/5, weak finger extensors, early flexor tone left wrist/finger flexors but full AROM and PRM                RLE: 5/5 HF, 5/5 KE, 5/5 DF, 5/5 EHL, 5/5 PF                 LLE:  5-/5 HF, 5-/5 KE, 5/5 DF, 5/5 EHL, 5/5 PF     :   Cognition: AAO to person, place, time and event. Can perform higher level cognitive tasks 3/3. Good attention, memory.  Ongoing motor delay in LUE > LLE - minimal Language: Fluent, No substitutions or neoglisms.   Sensation: To light touch intact in BL UEs and LEs  Reflexes: 2+ in BL UE and LEs. + L Hoffman's  CN: + L facial droop + L  V2/3 sensory loss + L tongue deviation, speech fairly clear Coordination: Fine motor difficulty LUE  .   Assessment/Plan: 1. Functional deficits which require 3+ hours per day of interdisciplinary therapy in a comprehensive inpatient rehab setting. Physiatrist is providing close team supervision and 24 hour management of active medical problems listed below. Physiatrist and rehab team continue to assess barriers to  discharge/monitor patient progress toward functional and medical goals  Care Tool:  Bathing    Body parts bathed by patient: Right arm, Left arm, Chest, Abdomen, Front perineal area, Buttocks, Right upper leg, Right lower leg, Left upper leg, Left lower leg, Face         Bathing assist Assist Level: Minimal Assistance - Patient > 75%     Upper Body Dressing/Undressing Upper body dressing   What is the patient wearing?: Pull over shirt    Upper body assist Assist Level: Moderate Assistance - Patient 50 - 74%    Lower Body Dressing/Undressing Lower body dressing      What is the patient wearing?: Pants     Lower body assist Assist for lower body dressing: Moderate Assistance - Patient 50 - 74%     Toileting Toileting    Toileting assist Assist for toileting: Minimal Assistance - Patient > 75%     Transfers Chair/bed transfer  Transfers assist     Chair/bed transfer assist level: Minimal Assistance - Patient > 75%     Locomotion Ambulation   Ambulation assist      Assist level: Contact Guard/Touching assist Assistive device: No Device Max distance: 200+   Walk 10 feet activity   Assist      Assist level: Contact Guard/Touching assist Assistive device: No Device   Walk 50 feet activity   Assist    Assist level: Contact Guard/Touching assist Assistive device: No Device    Walk 150 feet activity   Assist    Assist level: Contact Guard/Touching assist Assistive device: No Device    Walk 10 feet on uneven surface  activity   Assist     Assist level: Contact Guard/Touching assist Assistive device: Other (comment)   Wheelchair     Assist Is the patient using a wheelchair?: No             Wheelchair 50 feet with 2 turns activity    Assist            Wheelchair 150 feet activity     Assist          Blood pressure 130/89, pulse 68, temperature 98.1 F (36.7 C), temperature source Oral, resp. rate 18, height 6\' 1"  (1.854 m), weight 95.4 kg, SpO2 100 %.  Medical Problem List and Plan 1. 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 Lavonna Monarch, c/b MCA bleeding with [facility]             -patient may shower             -ELOS/Goals: 10-14 days, supervision to Mod I PT/OT; DC date 5/22   -Continue CIR therapies including PT, OT, and SLP    - started e-stim LUE 5/14   - staple and suture removal today 5/21   - 5/14: Per teams, CGA-Min A transfers and ADLs. Gets overwhelmed and frustrated with L hand functional tasks. Similar for PT. Doing very well overall, good motivation and family support. Needs tub bench at DC.    2.  Antithrombotics: -DVT/anticoagulation:  Pharmaceutical: Heparin 5000U q8h             -antiplatelet therapy: none   3. Pain Management/spasticity: Tylenol and Robaxin as needed; oxycodone 10 mg q 4 hours prn             --Lyrica 100 mg TID for HA   - 5/14: Schedule Tylenol 1000 mg TID; decrease  oxycodone to 5 mg Q6H PRN   - 5/15: monitor HA with xyzal as below     -5/17--early flexor tone LUE--continue stretching, splinting, e-stim of  extensors  -01/15/23 mild R retroorbital HA this morning but no other symptoms, seemed improved after meds (seen in hallway during therapy)  5/20: No HA, monitor  4. Mood/Behavior/Sleep: LCSW to evaluate and provide emotional support. History of depressed mood, anxiety, PTSD             -antipsychotic agents: continue Rexulti 2 mg daily             -continue Buspar 30 mg BID  -Melatonin 3mg  QHS   - 5/16: On chart review takes Doxazosin 4 mg QHS at home for PTSD; resumed.   -5/17 will schedule trazodone 50mg  qhs  - improved sleep   5. Neuropsych/cognition: This patient is capable of making decisions on his own behalf.   6. Skin/Wound Care: Routine skin care checks             -monitor surgical incision (POD #8 on 5/10)  - DC staples and suture 5/21   7. Fluids/Electrolytes/Nutrition: Routine Is and Os. Check CMET in am and daily BMET for 5-7 days.  -01/07/23 Na 133 (see #9), BUN marginally elevated at 24 but Cr WNL, BMP otherwise fairly unremarkable; monitor labs   -01/08/23 BMP stable from yesterday, see below for Na/AKI discussion -5/13: Blood sugars well-controlled despite steroid taper, discontinue CBGs -01/14/23 BMP fairly unremarkable/stable, d/c daily BMPs, has CMP scheduled for Monday - see below - 5/21: Na stable off of urease; BUN/Cr appropriate; stable for DC  8: Seizure prophylaxis: Continue Trileptal 300 mg BID             --has stable auras w/epigastric rising and mild dissociation.    9: Hyponatremia: To Continue Ure NA for 10 days (end 01/16/23)  --Continue 1 Liter fluid restriction with Gatorade prn -01/07/23 Na 133, monitor on labs -01/08/23 Na slightly lower at 131, cont fluid restriction and regimen for now, monitor on daily labs   5/13: NA continues to downtrend, nephrology consulted, appreciate recommendations; increase Urease and stop Gatorade accommodations for fluid restriction per their recommendations.  Daily BMPs for follow-up.  5/14: NA back to 131, continue  nephrology recommendations  5.15: Labs pending this AM. per nephro, when Na is up to 135 can decrease UreNa back to 15 g BID and resume taper as written in d/c instructions from [facility] (15 g BID x 10 days then off)    - 5/16: NA 136; reduce urena to 15 mg BID   -5/17 Na up to 138--continue urena per taper instructions above  -01/14/23 Na 135, has been stable several days, will recheck Monday  5/20: Na 137 - Urease Dced; monitor tomorrow AM. Discussed with patient and wife maintaining fluid restriction on DC.   5/21: Na 140, stable  10: Post-op craniotomy: continue steroid taper: -Decadron 4 mg for 4 days, 2 mg BID for two days and then 2 mg once daily for one day-- completed 01/12/23 --appt 05/15 10 am for Rad MRI [phone] and Dr. Burtis Junes office @ 3 pm at cancer center [phone]. -MRI pending then OP f/u     11: Persistent Cough - resolved: On Doxycycline X 12 days per recommendations. Tessalon ineffective.  -01/07/23 CXR unremarkable, monitor cough, cont Robitussin PRN  5/13: Persistent; recheck chest x-ray, add flutter valve for secretions 5/14: CXR clear, flutter valve worsening. Can bring in home Xyzal to swap out  for Claritin. Added PRN inhaler 5/15: Xyzal started. -- has not brought in from home, waiting. Added flonase and tessalon perles.    5/17 still waiting on Xyzal. Will add scheduled mucinex dm to help with cough/loosening of secretions although it's only a semi-productive cough  12: Seasonal allergies: continue loratadine--advised to bring Xyzal from home as this works better-- not on formulary here. Continue Flonase   13: BP goals: SBP between 120-140 to allow for perfusion. If above 140, monitor and do not give PRNs per NS  -01/07/23 BPs mostly at goal, monitor closely  -01/08/23 BPs at goal since last night, monitor  5/13: Diastolic HTN; decreased steroids today, monitor  5/14-21: MUCH better with steroid taper; monitor  Vitals:   01/13/23 1439 01/13/23 2012 01/14/23  0502 01/14/23 1424  BP: 119/86 105/78 109/83 117/81   01/14/23 1952 01/15/23 0625 01/15/23 1421 01/15/23 1946  BP: 117/86 107/81 117/81 105/77   01/16/23 0518 01/16/23 1947 01/17/23 0439 01/17/23 0843  BP: (!) 112/90 114/66 123/83 130/89      14. Constipation: Has resolved with use of Sennakot 2 tabs BID and Miralax 17g BID, monitor for oversoftening   - LBM 5/19, daily BMs, cont regimen   15. GI prophylaxis: continue Protonix 40mg  QD  16. Sensation of tongue swelling/dysarthria - V2/3 sensory deficits and tongue deviation on exam; discussed likely d/t sensory changes and biting tongue; patient agreeing. Monitor, SLP sees patient.    - per SLP, no deficits, no needs on discharge    LOS: 11 days A FACE TO FACE EVALUATION WAS PERFORMED  Angelina Sheriff 01/17/2023, 9:26 AM

## 2023-01-17 NOTE — Progress Notes (Signed)
Occupational Therapy Discharge Summary  Patient Details  Name: Shawn York MRN: [id number] Date of Birth: Feb 25, 1976  Date of Discharge from OT service:Jan 17, 2023  Today's Date: 01/17/2023 OT Individual Time: 1002-1130 OT Individual Time Calculation (min): 88 min   Skilled Therapeutic Intervention:  Patient received supine in bed, agreeable to OT session.  Patient able to gather clothing and walk to shower.  Patient did not require physical assistance to bathe and dress himself.  He performed safely.  Patient did need cueing x 2 to put his shirt on correctly - donned backwards x 2 and was unaware.  Patient walked to main gym and worked on [employer].  Worked in modified plantigrade position to load left arm, then worked on high reach patterns.  Patient fatigues quickly.  Worked toward modified plank and sideplank left.   Followed with fine motor work LUE.  Resting forearm on table to reduce shoulder hike - worked on grasp, release, coordination of forearm, wrist, hand - increasing wrist extension.   Left up in bed with alarm set and personal items in reach.     Patient has met 10 of 10 long term goals due to improved activity tolerance, improved balance, postural control, ability to compensate for deficits, functional use of  LEFT upper and LEFT lower extremity, improved attention, improved awareness, and improved coordination.  Patient to discharge at overall Supervision level.  Patient's care partner is independent to provide the necessary cognitive assistance at discharge.    Reasons goals not met: NA  Recommendation:  Patient will benefit from ongoing skilled OT services in outpatient setting to continue to advance functional skills in the area of BADL, iADL, Vocation, and Reduce care partner burden.  Equipment: Shower seat  Reasons for discharge: treatment goals met and discharge from hospital  Patient/family agrees with progress made and goals achieved: Yes  OT  Discharge Precautions/Restrictions  Precautions Precautions: Fall Precaution Comments: L hemi, R crani Restrictions Weight Bearing Restrictions: No  Pain Pain Assessment Pain Scale: 0-10 Pain Score: 0-No pain ADL ADL Eating: Independent Where Assessed-Eating: Edge of bed Grooming: Independent Where Assessed-Grooming: Standing at sink Upper Body Bathing: Setup Where Assessed-Upper Body Bathing: Shower Lower Body Bathing: Setup Where Assessed-Lower Body Bathing: Shower Upper Body Dressing: Setup Where Assessed-Upper Body Dressing: Edge of bed Lower Body Dressing: Setup Where Assessed-Lower Body Dressing: Standing at sink, Sitting at sink Toileting: Setup Where Assessed-Toileting: Teacher, adult education: Close supervision Toilet Transfer Method: Insurance claims handler: Close supervison Web designer Method: Ship broker: Information systems manager without back Film/video editor: Close supervision Film/video editor Method: Designer, industrial/product: Information systems manager without back Vision Baseline Vision/History: 1 Wears glasses Patient Visual Report: No change from baseline Vision Assessment?: No apparent visual deficits Perception  Perception: Within Functional Limits Praxis Praxis: Intact Cognition Cognition Overall Cognitive Status: Impaired/Different from baseline Arousal/Alertness: Awake/alert Orientation Level: Person;Place;Situation Person: Oriented Place: Oriented Situation: Oriented Memory: Impaired Memory Impairment: Decreased recall of new information Attention: Sustained;Selective Sustained Attention: Appears intact Selective Attention: Impaired Selective Attention Impairment: Functional complex Awareness: Appears intact Safety/Judgment: Appears intact Brief Interview for Mental Status (BIMS) Repetition of Three Words (First Attempt): 3 Temporal Orientation: Year: Correct Temporal Orientation: Month: Accurate  within 5 days Temporal Orientation: Day: Correct Recall: "Sock": Yes, no cue required Recall: "Blue": Yes, no cue required Recall: "Bed": Yes, no cue required BIMS Summary Score: 15 Sensation Sensation Light Touch: Impaired by gross assessment Hot/Cold: Not tested Proprioception: Appears Intact Stereognosis: Impaired by gross assessment Coordination  Gross Motor Movements are Fluid and Coordinated: No Fine Motor Movements are Fluid and Coordinated: No Coordination and Movement Description: left inattention - cueing to use left hand functionally, lacks coordination of hand with arm 9 Hole Peg Test: Box and Blocks - unable to complete seated - 6 blocks standing Motor  Motor Motor: Hemiplegia;Abnormal tone Motor - Skilled Clinical Observations: L hemi Motor - Discharge Observations: Left hemiplegia Mobility  Bed Mobility Bed Mobility: Rolling Right;Rolling Left;Supine to Sit;Sit to Supine Rolling Right: Independent Rolling Left: Independent Supine to Sit: Independent Sit to Supine: Independent Transfers Sit to Stand: Supervision/Verbal cueing;Set up assist Stand to Sit: Supervision/Verbal cueing  Trunk/Postural Assessment  Cervical Assessment Cervical Assessment: Within Functional Limits Thoracic Assessment Thoracic Assessment: Exceptions to [facility] (slight thoracic flexion) Lumbar Assessment Lumbar Assessment: Within Functional Limits Postural Control Postural Control: Deficits on evaluation (tends toward slight trunk and hip flexion)  Balance Balance Balance Assessed: Yes Static Standing Balance Static Standing - Balance Support: No upper extremity supported Static Standing - Level of Assistance: 7: Independent Dynamic Standing Balance Dynamic Standing - Balance Support: No upper extremity supported;During functional activity Dynamic Standing - Level of Assistance: 5: Stand by assistance Dynamic Standing - Balance Activities: Forward lean/weight shifting;Lateral  lean/weight shifting;Reaching across midline Extremity/Trunk Assessment RUE Assessment RUE Assessment: Within Functional Limits LUE Assessment LUE Assessment: Exceptions to [facility] Passive Range of Motion (PROM) Comments: Full PROM Active Range of Motion (AROM) Comments: Has active movement - can reach overhead - difficulty sustaining, and increased compensations noted with fatigue LUE Body System: Neuro Brunstrum levels for arm and hand: Arm;Hand Brunstrum level for arm: Stage V Relative Independence from Synergy Brunstrum level for hand: Stage IV Movements deviating from synergies   Collier Salina 01/17/2023, 12:35 PM

## 2023-01-17 NOTE — Progress Notes (Signed)
Speech Language Pathology Discharge Summary  Patient Details  Name: Shawn York MRN: [id number] Date of Birth: [birth year]  Date of Discharge from SLP service:Jan 17, 2023  Patient has met 1 of 1 long term goals.  Patient to discharge at overall Modified Independent level.   Reasons goals not met: N/A   Clinical Impression/Discharge Summary: Patient has made functional gains and has met 1 of 1 LTGs this admission. Currently, patient requires extra processing time and is overall Mod I for recall of daily information with use of compensatory strategies. Patient continues to report impaired sensation of his oral-motor musculature resulting in him biting his lips and tongue intermittently. However, his strength appears intact without evidence of dysarthria and with 100% intelligibility. Patient and family education is complete and patient will discharge home with assistance from family. Due to patient's high level of functioning and overall independence at baseline, f/u outpatient SLP services are recommended.   Care Partner:  Caregiver Able to Provide Assistance: Yes  Type of Caregiver Assistance: Cognitive;Physical  Recommendation:  Outpatient SLP;24 hour supervision/assistance  Rationale for SLP Follow Up: Maximize cognitive function and independence;Reduce caregiver burden   Equipment: N/A   Reasons for discharge: Treatment goals met;Discharged from hospital   Patient/Family Agrees with Progress Made and Goals Achieved: Yes    Meha Vidrine 01/17/2023, 6:33 AM

## 2023-01-17 NOTE — Progress Notes (Signed)
Physical Therapy Session Note  Patient Details  Name: Shawn York MRN: [id number] Date of Birth: December 10, 1975  Today's Date: 01/17/2023 PT Individual Time: 7829-5621 PT Individual Time Calculation (min): 54 min   Short Term Goals: Week 1:  PT Short Term Goal 1 (Week 1): Patient will complete bed mobility with Supv PT Short Term Goal 2 (Week 1): Patient will ambulate >200' with CGA PT Short Term Goal 3 (Week 1): Patient will ascend/descend 12 steps with CGA  Skilled Therapeutic Interventions/Progress Updates:    Pt received awake, supine in bed, and agreeable to therapy session. Supine>sitting L EOB, HOB flat but using bedrail, mod-I. Sit<>stands, no AD, with SBA for safety progressing to independent during session. Gait training in/out bathroom, no AD, with SBA on pt's L side for safety, no LOB. Sit<>stand to/from toilet with SBA for safety - pt manages clothes without assist. Continent of bladder. Standing hand hygiene at sink - pt with slight inattention to L hand - oral care without assist. Pt ate banana sitting EOB with therapist providing education and encouragement on using L hand and incorporating it in as many safe tasks as possible to promote NMR.   Gait training >212ft throughout CIR, no AD, with SBA on L side for safety - pt demos adequate L LE foot clearance, not catching it during swing - reciprocal stepping pattern with adequate gait speed. No LOB noted.  Stair navigation training ascending/descending 4 steps (6" height) + 22 steps in stairwell using R UE support on each HR with CGA for safety - pt able to recall step-to pattern as safest technique - progressed to reciprocal stepping pattern for NMR with pt only catching L toes 1x requiring light min assist for safety - reinforced education that he has been told to perform step-to pattern for safety.   Therapist educated pt on fall risk education and calling 911. Therapist educated on proper floor transfer technique and demonstrated  it. Pt able to demo understanding of fall education and proper floor transfer technique with supervision for safety.  Performed dynamic gait training and L UE NMR task of picking up clothespins and carrying them from 1 table to another while stepping over 4x 6" hurdles with reciprocal stepping then progressed to side stepping over them. Pt with significant difficulty picking up clothespins due to challenges opening fingers prior to initiating grasp (therapist having to place clothespin in his hand) and then unable to open clothespins correctly, having to use a modified full hand squeeze/grasp. CGA/light min assist for balance stability with gait traks.  At end of session, pt left seated on EOB with needs in reach, bed alarm on, and his friend visiting with nurse present.   Therapy Documentation Precautions:  Precautions Precautions: Fall Precaution Comments: L hemi, R crani Restrictions Weight Bearing Restrictions: No   Pain:  No reports of pain throughout session.    Therapy/Group: Individual Therapy  Ginny Forth , PT, DPT, NCS, CSRS 01/17/2023, 7:49 AM

## 2023-01-17 NOTE — Progress Notes (Signed)
Physical Therapy Discharge Summary  Patient Details  Name: Shawn York MRN: [id number] Date of Birth: 10-Jan-1976  Date of Discharge from PT service:Jan 17, 2023  Today's Date: 01/17/2023 PT Individual Time: 1300-1400 PT Individual Time Calculation (min): 60 min    Patient has met 9 of 9 long term goals due to improved activity tolerance, improved balance, improved postural control, increased strength, improved attention, improved awareness, and improved coordination.  Patient to discharge at an ambulatory level Modified Independent.   Patient's care partner is independent to provide the necessary physical and cognitive assistance at discharge.  Reasons goals not met: NA  Recommendation:  Patient will benefit from ongoing skilled PT services in outpatient setting to continue to advance safe functional mobility, address ongoing impairments in balance, L hemibody motor control and strength, ambulation, endurance, and minimize fall risk.  Equipment: No equipment provided  Reasons for discharge: treatment goals met and discharge from hospital  Patient/family agrees with progress made and goals achieved: Yes  Skilled Therapeutic Interventions: Pt received supine in bed and agrees to therapy. No complaint of pain. Bed mobility independent. Pt performs sit to stand transfer throughout session independently. Initially pt ambulates with close supervision and cues to utilize vision to improve balance and prevent L sided lean. Pt progresses to ambulating independently during session. Pt completes car transfer with close supervision and cues for sequencing and ramp navigation independently. Following rest break, pt completes x12 6" steps without handrails and with cues for step sequencing and safety. Pt completes Berg balance test, as detailed below, indicating clinically significant improvement in balance since evaluation. Pt completes toilet transfer and is positive for bowel movement. Pt independent  with pericare. Pt then tasked with ambulating while holding 2 lb weight in L hand to challenge attention and coordination. Pt also given task of naming animals with each letter of the alphabet. Pt is able to complete x250' without losing grip on 2lb weight and without LOBs. Pt ambulates back to room. Left supine in bed with all needs within reach.   PT Discharge Precautions/Restrictions Precautions Precautions: Fall Precaution Comments: L hemi, R crani Restrictions Weight Bearing Restrictions: No Pain Pain Assessment Pain Scale: 0-10 Pain Score: 0-No pain Pain Interference Pain Interference Pain Effect on Sleep: 1. Rarely or not at all Pain Interference with Therapy Activities: 1. Rarely or not at all Pain Interference with Day-to-Day Activities: 1. Rarely or not at all Vision/Perception  Vision - History Ability to See in Adequate Light: 0 Adequate Perception Perception: Within Functional Limits Praxis Praxis: Intact  Cognition Overall Cognitive Status: Impaired/Different from baseline Arousal/Alertness: Awake/alert Orientation Level: Oriented X4 Attention: Sustained;Selective Sustained Attention: Appears intact Selective Attention: Impaired Selective Attention Impairment: Functional complex Memory: Impaired Memory Impairment: Decreased recall of new information Awareness: Appears intact Safety/Judgment: Appears intact Sensation Sensation Light Touch: Impaired by gross assessment Hot/Cold: Not tested Proprioception: Appears Intact Stereognosis: Impaired by gross assessment Coordination Gross Motor Movements are Fluid and Coordinated: No Fine Motor Movements are Fluid and Coordinated: No Coordination and Movement Description: left inattention - cueing to use left hand functionally, lacks coordination of hand with arm 9 Hole Peg Test: Box and Blocks - unable to complete seated - 6 blocks standing Motor  Motor Motor: Hemiplegia;Abnormal tone Motor - Skilled Clinical  Observations: L hemi Motor - Discharge Observations: Left hemiplegia  Mobility Bed Mobility Bed Mobility: Rolling Right;Rolling Left;Supine to Sit;Sit to Supine Rolling Right: Independent Rolling Left: Independent Supine to Sit: Independent Sit to Supine: Independent Transfers Transfers: Sit to Stand;Stand  to Sit;Stand Pivot Transfers Sit to Stand: Supervision/Verbal cueing;Set up assist Stand to Sit: Supervision/Verbal cueing Stand Pivot Transfers: Supervision/Verbal cueing;Set up assist Stand Pivot Transfer Details: Tactile cues for posture Transfer (Assistive device): None Locomotion  Gait Ambulation: Yes Gait Assistance: Independent Gait Distance (Feet): 300 Feet Gait Gait: Yes Gait Pattern: Impaired Gait Pattern:  (slight L lateral lean) Stairs / Additional Locomotion Stairs: Yes Stairs Assistance: Supervision/Verbal cueing Stair Management Technique: No rails Number of Stairs: 12 Height of Stairs: 6 Ramp: Independent Curb: Supervision/Verbal cueing Wheelchair Mobility Wheelchair Mobility: No  Trunk/Postural Assessment  Cervical Assessment Cervical Assessment: Within Functional Limits Thoracic Assessment Thoracic Assessment: Exceptions to [facility] (slight thoracic flexion) Lumbar Assessment Lumbar Assessment: Within Functional Limits Postural Control Postural Control: Deficits on evaluation (tends toward slight trunk and hip flexion)  Balance Balance Balance Assessed: Yes Standardized Balance Assessment Standardized Balance Assessment: Berg Balance Test Berg Balance Test Sit to Stand: Able to stand without using hands and stabilize independently Standing Unsupported: Able to stand safely 2 minutes Sitting with Back Unsupported but Feet Supported on Floor or Stool: Able to sit safely and securely 2 minutes Stand to Sit: Sits safely with minimal use of hands Transfers: Able to transfer safely, minor use of hands Standing Unsupported with Eyes Closed: Able to stand  10 seconds safely Standing Ubsupported with Feet Together: Able to place feet together independently and stand 1 minute safely From Standing, Reach Forward with Outstretched Arm: Can reach confidently >25 cm (10") From Standing Position, Pick up Object from Floor: Able to pick up shoe, needs supervision From Standing Position, Turn to Look Behind Over each Shoulder: Looks behind from both sides and weight shifts well Turn 360 Degrees: Able to turn 360 degrees safely in 4 seconds or less Standing Unsupported, Alternately Place Feet on Step/Stool: Able to stand independently and complete 8 steps >20 seconds Standing Unsupported, One Foot in Front: Loses balance while stepping or standing Standing on One Leg: Able to lift leg independently and hold 5-10 seconds Total Score: 49 Static Sitting Balance Static Sitting - Level of Assistance: 7: Independent Dynamic Sitting Balance Dynamic Sitting - Level of Assistance: 7: Independent Static Standing Balance Static Standing - Balance Support: No upper extremity supported Static Standing - Level of Assistance: 7: Independent Dynamic Standing Balance Dynamic Standing - Balance Support: No upper extremity supported;During functional activity Dynamic Standing - Level of Assistance: 5: Stand by assistance Dynamic Standing - Balance Activities: Forward lean/weight shifting;Lateral lean/weight shifting;Reaching across midline Extremity Assessment  RUE Assessment RUE Assessment: Within Functional Limits LUE Assessment LUE Assessment: Exceptions to [facility] Passive Range of Motion (PROM) Comments: Full PROM Active Range of Motion (AROM) Comments: Has active movement - can reach overhead - difficulty sustaining, and increased compensations noted with fatigue LUE Body System: Neuro Brunstrum levels for arm and hand: Arm;Hand Brunstrum level for arm: Stage V Relative Independence from Synergy Brunstrum level for hand: Stage IV Movements deviating from  synergies RLE Assessment RLE Assessment: Within Functional Limits LLE Assessment LLE Assessment: Within Functional Limits General Strength Comments: Hip flexors 4+/5, otherwise [facility]   Beau Fanny, PT, DPT 01/17/2023, 4:22 PM

## 2023-01-17 NOTE — Progress Notes (Signed)
Patient off the unit at this time for MRI. Ticket to ride given to transporter.     Tilden Dome, LPN

## 2023-01-17 NOTE — Progress Notes (Signed)
Staples removed per MD order. Patient tolerated well.     Tilden Dome, LPN

## 2023-01-17 NOTE — Progress Notes (Signed)
Setzer, PA aware of MRI Results.    Tilden Dome, LPN

## 2023-01-18 ENCOUNTER — Other Ambulatory Visit (HOSPITAL_COMMUNITY): Payer: Self-pay

## 2023-01-18 NOTE — Progress Notes (Signed)
Inpatient Rehabilitation Care Coordinator Discharge Note   Patient Details  Name: Shawn York MRN: [id number] Date of Birth: June 21, 1976   Discharge location: D/c to home with his wife. Support from wife and his parents at d/c.  Length of Stay: 11 days  Discharge activity level: Mod I  Home/community participation: Limited  Patient response XB:MWUXLK Literacy - How often do you need to have someone help you when you read instructions, pamphlets, or other written material from your doctor or pharmacy?: Never  Patient response GM:WNUUVO Isolation - How often do you feel lonely or isolated from those around you?: Never  Services provided included: MD, RD, PT, OT, SLP, TR, Pharmacy, Neuropsych, SW, RN, [employer]  Financial Services:  Field seismologist Utilized: [employer]  Choices offered to/list presented to: patient and pt wife  Follow-up services arranged:  Outpatient    Outpatient Servicies: [facility] at [facility] for PT/OT/SLP      Patient response to transportation need: Is the patient able to respond to transportation needs?: Yes In the past 12 months, has lack of transportation kept you from medical appointments or from getting medications?: No In the past 12 months, has lack of transportation kept you from meetings, work, or from getting things needed for daily living?: No   Patient/Family verbalized understanding of follow-up arrangements:  Yes  Individual responsible for coordination of the follow-up plan: contact pt wife Shawn York [phone]  Confirmed correct DME delivered: Shawn York 01/18/2023    Comments (or additional information):  Summary of Stay    Date/Time Discharge Planning CSW  01/16/23 1137 Pt will d/c to home with support from his wife and parents as wife works during the day. Pt setup for outpatient PT/OT/SLp with [facility]- [place] location. Fam edu completed on 5/20 with pt wife. SW will confirm there are no  barriers to discharge. AAC  01/09/23 1352 D/c to home with wife. Support from his parents during the day while wife is working. SW will confirm there are no barriers to discharge. AAC       Shawn York A Shawn York

## 2023-01-18 NOTE — Progress Notes (Signed)
Inpatient Rehabilitation Discharge Medication Review by a Pharmacist  A complete drug regimen review was completed for this patient to identify any potential clinically significant medication issues.  High Risk Drug Classes Is patient taking? Indication by Medication  Antipsychotic Yes Brexpiprazole - mood stablization  Anticoagulant No   Antibiotic No   Opioid Yes   Antiplatelet No   Hypoglycemics/insulin No   Vasoactive Medication Yes Doxazosin - PTSD  Chemotherapy No   Other Yes Buspirone - anxiety/ mood stabilization Oxcarbazine - seizure prophylaxis Pregabalin - pain Trazodone - sleep Senokot S - laxative Fluticasone nasal, Levocetirizine - seasonal allergies Benzonatate - cough B-complex - supplement Clear Eyes Redness to left eye - redness/irritation  PRNs:  Acetaminophen - pain Epi pen - allergic reaction Guaifenesin/dextromethorphan - cough Oxycodone - severe pain Miralax - constipation Artificial Tears - dry eyes     Type of Medication Issue Identified Description of Issue Recommendation(s)  Drug Interaction(s) (clinically significant)     Duplicate Therapy     Allergy     No Medication Administration End Date     Incorrect Dose     Additional Drug Therapy Needed     Significant med changes from prior encounter (inform family/care partners about these prior to discharge).    Other       Clinically significant medication issues were identified that warrant physician communication and completion of prescribed/recommended actions by midnight of the next day:  No   Time spent performing this drug regimen review (minutes):  20   Dennie Fetters, [place] 01/18/2023 10:53 AM

## 2023-01-18 NOTE — Progress Notes (Signed)
Discharge instructions provided by Wendi Maya, PA. Medications provided by TOC. Staff assisted patient off the unit, patient left via private car with wife.      Tilden Dome, LPN

## 2023-01-18 NOTE — Progress Notes (Signed)
PROGRESS NOTE   Subjective/Complaints:  No acute compaints. No events ovenright. All questions answered. Stable for duscharge.   ROS: Patient denies fever, rash, sore throat, blurred vision, dizziness, nausea, vomiting, diarrhea,  shortness of breath or chest pain, joint or back/neck pain,  or mood change.    Objective:   MR BRAIN W WO CONTRAST  Result Date: 01/17/2023 CLINICAL DATA:  Brain/CNS neoplasm, monitor follow up post oligodendroglioma resection EXAM: MRI HEAD WITHOUT AND WITH CONTRAST TECHNIQUE: Multiplanar, multiecho pulse sequences of the brain and surrounding structures were obtained without and with intravenous contrast. 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 There is a 5 mm postoperative subdural hematoma along the right cerebral convexity. 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 There is also small volume subarachnoid hemorrhage along the right parietal and posterior frontal lobes (series 14, image 45). There is dural enhancement and thickening adjacent to the craniotomy site, favored to be postoperative. There is also dural enhancement and thickening along the left occipital convexity (series 11, image 13) and likely the left temporal convexity (series 11, image 10), which may be due to CSF hypotension. Compared to the preoperative exam there is new T2/FLAIR hyperintense signal abnormality in the corona radiata on the right with associated curvilinear contrast enhancement in these regions. This could potentially represent a region of subacute  infarct. Previously seen nodular foci of contrast enhancement in the right temporal lobe are no longer visualized. Vascular: Normal flow voids. Skull and upper cervical spine: Status post right pterional craniotomy. Sinuses/Orbits: No middle ear or mastoid effusion. Paranasal sinuses are notable for mild mucosal thickening of the floor of the right maxillary sinus. Orbits are unremarkable. Other: None. IMPRESSION: 1. 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 Previously seen nodular foci of contrast enhancement in the right temporal lobe are no longer visualized. 2. New T2/FLAIR hyperintense signal abnormality in the right corona radiata with associated curvilinear contrast enhancement in these regions. This could potentially represent a region of subacute infarct. Recommend attention on follow-up. 3. Small volume subarachnoid hemorrhage and postoperative subdural hematoma along the right cerebral convexity, as above. 4. 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 5. Dural enhancement and thickening along the left occipital convexity and likely the left temporal convexity, which may be due to CSF hypotension. Electronically Signed   By: Lorenza Cambridge M.D.   On: 01/17/2023 09:25   Recent Labs    01/16/23 0526  WBC 4.1  HGB 11.7*  HCT 34.0*  PLT 159     Recent Labs    01/16/23 0526 01/17/23 0558  NA 137 140  K 3.9 4.0  CL 106 108  CO2 23 25  GLUCOSE 102* 101*  BUN 33* 22*  CREATININE 0.88 0.91  CALCIUM  8.6* 8.6*     Intake/Output Summary (Last 24 hours) at 01/18/2023 0847 Last data filed at 01/18/2023 0751 Gross per 24 hour  Intake 1080 ml  Output 700 ml  Net 380 ml         Physical Exam: Vital Signs Blood pressure 125/84, pulse 69, temperature 97.8 F (36.6  C), temperature source Oral, resp. rate 16, height 6\' 1"  (1.854 m), weight 95.4 kg, SpO2 100 %.  Constitutional: No distress . Vital signs reviewed. Sitting up in bed HEENT: NCAT, EOMI, oral membranes moist Neck: supple Cardiovascular: RRR without murmur. No JVD    Respiratory/Chest: CTA Bilaterally without wheezes or rales. Normal effort. No cough during exam.    GI/Abdomen: BS +, non-tender, non-distended Psych: pleasant and cooperative, flat affect Skin: C/D/I. No apparent lesions aside from R craniotomy wound, well approximated w/ sutures remaining MSK: normal PROM, no jt pain Neuro:       Strength:                RUE: 5/5 SA, 5/5 EF, 5/5 EE, 5/5 WE, 5/5 FF, 5/5 FA                 LUE: 4+/5 FF< 3/5 FA, 4/5 EF, EE, SA                RLE: 5/5 HF, 5/5 KE, 5/5 DF, 5/5 EHL, 5/5 PF                 LLE:  5-/5 HF, 5-/5 KE, 5/5 DF, 5/5 EHL, 5/5 PF    :   Cognition: AAO to person, place, time and event. Can perform higher level cognitive tasks 3/3. Good attention, memory.  Ongoing motor delay in LUE > LLE - minimal Language: Fluent, No substitutions or neoglisms.   Sensation: To light touch intact in BL UEs and LEs  Reflexes: 2+ in BL UE and LEs. + L Hoffman's  CN: + L facial droop + L  V2/3 sensory loss + L tongue deviation, speech fairly clear Coordination: Fine motor difficulty LUE - ongoing Tone: MAS 1 L elbow, wrist flexors  .   Assessment/Plan: 1. Functional deficits which require 3+ hours per day of interdisciplinary therapy in a comprehensive inpatient rehab setting. Physiatrist is providing close team supervision and 24 hour management of active medical problems listed below. Physiatrist and rehab team continue to assess barriers to discharge/monitor patient progress toward functional and medical goals  Care Tool:  Bathing    Body parts bathed by patient: Right arm, Left arm, Chest, Abdomen, Front perineal area, Buttocks, Right upper leg, Right lower leg, Left upper leg,  Left lower leg, Face         Bathing assist Assist Level: Supervision/Verbal cueing     Upper Body Dressing/Undressing Upper body dressing   What is the patient wearing?: Pull over shirt    Upper body assist Assist Level: Set up assist    Lower Body Dressing/Undressing Lower body dressing      What is the patient wearing?: Pants, Underwear/pull up     Lower body assist Assist for lower body dressing: Set up assist     Toileting Toileting    Toileting assist Assist for toileting: Set up assist     Transfers Chair/bed transfer  Transfers assist     Chair/bed transfer assist level: Independent     Locomotion Ambulation   Ambulation assist      Assist level: Independent Assistive device: No Device Max distance: 300'  Walk 10 feet activity   Assist     Assist level: Independent Assistive device: No Device   Walk 50 feet activity   Assist    Assist level: Independent Assistive device: No Device    Walk 150 feet activity   Assist    Assist level: Independent Assistive device: No Device    Walk 10 feet on uneven surface  activity   Assist     Assist level: Independent Assistive device: Other (comment)   Wheelchair     Assist Is the patient using a wheelchair?: No             Wheelchair 50 feet with 2 turns activity    Assist            Wheelchair 150 feet activity     Assist          Blood pressure 125/84, pulse 69, temperature 97.8 F (36.6 C), temperature source Oral, resp. rate 16, height 6\' 1"  (1.854 m), weight 95.4 kg, SpO2 100 %.  Medical Problem List and Plan 1. 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 Lavonna Monarch, c/b [facility] bleeding with [facility]             -patient may shower             -ELOS/Goals: 10-14 days, supervision to Mod I PT/OT; DC date 5/22   -Stable for discharge from CIR   - started e-stim LUE  5/14   - staple and suture removal today 5/21 - reiterated need for suture removal before DC 5/22     - 5/14: Per teams, CGA-Min A transfers and ADLs. Gets overwhelmed and frustrated with L hand functional tasks. Similar for PT. Doing very well overall, good motivation and family support. Needs tub bench at DC.    2.  Antithrombotics: -DVT/anticoagulation:  Pharmaceutical: Heparin 5000U q8h             -antiplatelet therapy: none   3. Pain Management/spasticity: Tylenol and Robaxin as needed; oxycodone 10 mg q 4 hours prn             --Lyrica 100 mg TID for HA   - 5/14: Schedule Tylenol 1000 mg TID; decrease oxycodone to 5 mg Q6H PRN   - 5/15: monitor HA with xyzal as below     -5/17--early flexor tone LUE--continue stretching, splinting, e-stim of extensors  -01/15/23 mild R retroorbital HA this morning but no other symptoms, seemed improved after meds (seen in hallway during therapy)  5/20: No HA, monitor  5/22: Continuing with intermediate oxycodone however minimal pain complaints; will wean as OP no chronic medication required  4. Mood/Behavior/Sleep: LCSW to evaluate and provide emotional support. History of depressed mood, anxiety, PTSD             -antipsychotic agents: continue Rexulti 2 mg daily             -continue Buspar 30 mg BID  -Melatonin 3mg  QHS   - 5/16: On chart review takes Doxazosin 4 mg QHS at home for PTSD; resumed.   -5/17 will schedule trazodone 50mg  qhs  - improved sleep   5. Neuropsych/cognition: This patient is capable of making decisions on his own behalf.   6. Skin/Wound Care: Routine skin care checks             -monitor surgical incision (POD #8 on 5/10)  - DC staples and suture 5/21 - 22  7. Fluids/Electrolytes/Nutrition: Routine Is and Os. Check CMET in am and daily BMET for 5-7 days.  -01/07/23 Na 133 (see #9), BUN marginally elevated at 24 but Cr WNL, BMP otherwise fairly unremarkable; monitor labs   -01/08/23 BMP stable from yesterday, see below  for Na/AKI discussion -5/13: Blood sugars well-controlled despite steroid taper, discontinue CBGs -01/14/23 BMP fairly unremarkable/stable, d/c daily BMPs, has CMP scheduled for Monday - see below - 5/21: Na stable off of urease; BUN/Cr appropriate; stable for DC  8: Seizure prophylaxis: Continue Trileptal 300 mg BID             --has stable auras w/epigastric rising and mild dissociation.    9: Hyponatremia: To Continue Ure NA for 10 days (end 01/16/23)  --Continue 1 Liter fluid restriction with Gatorade prn -01/07/23 Na 133, monitor on labs -01/08/23 Na slightly lower at 131, cont fluid restriction and regimen for now, monitor on daily labs   5/13: NA continues to downtrend, nephrology consulted, appreciate recommendations; increase Urease and stop Gatorade accommodations for fluid restriction per their recommendations.  Daily BMPs for follow-up.  5/14: NA back to 131, continue nephrology recommendations  5.15: Labs pending this AM. per nephro, when Na is up to 135 can decrease UreNa back to 15 g BID and resume taper as written in d/c instructions from [facility] (15 g BID x 10 days then off)    - 5/16: NA 136; reduce urena to 15 mg BID   -5/17 Na up to 138--continue urena per taper instructions above  -01/14/23 Na 135, has been stable several days, will recheck Monday  5/20: Na 137 - Urease Dced; monitor tomorrow AM. Discussed with patient and wife maintaining fluid restriction on DC.   5/21: Na 140, stable  10: Post-op craniotomy: continue steroid taper: -Decadron 4 mg for 4 days, 2 mg BID for two days and then 2 mg once daily for one day-- completed 01/12/23 --appt 05/15 10 am for Rad MRI [phone] and Dr. Burtis Junes office @ 3 pm at [facility] [phone]. -MRI performed, results discussion per OP f/u     11: Persistent Cough - resolved: On Doxycycline X 12 days per recommendations. Tessalon ineffective.  -01/07/23 CXR unremarkable, monitor cough, cont Robitussin PRN  5/13: Persistent;  recheck chest x-ray, add flutter valve for secretions 5/14: CXR clear, flutter valve worsening. Can bring in home Xyzal to swap out for Claritin. Added PRN inhaler 5/15: Xyzal started. -- has not brought in from home, waiting. Added flonase and tessalon perles.    5/17 still waiting on Xyzal. Will add scheduled mucinex dm to help with cough/loosening of secretions although it's only a semi-productive cough  12: Seasonal allergies: continue loratadine--advised to bring Xyzal from home as this works better-- not on formulary here. Continue Flonase   13: BP goals: SBP between 120-140 to allow for perfusion. If above 140, monitor and do not give PRNs per NS  -01/07/23 BPs mostly at goal, monitor closely  -01/08/23 BPs at goal since last night, monitor  5/13: Diastolic HTN; decreased steroids today, monitor  5/14-21: MUCH better with steroid taper; monitor  Vitals:   01/14/23 0502 01/14/23 1424 01/14/23 1952 01/15/23 0625  BP: 109/83 117/81 117/86 107/81   01/15/23 1421 01/15/23 1946 01/16/23 0518 01/16/23 1947  BP: 117/81 105/77 (!) 112/90 114/66   01/17/23 0439 01/17/23 0843 01/17/23 1507 01/17/23 2008  BP: 123/83 130/89 126/84 125/84      14. Constipation: Has resolved with use of Sennakot 2 tabs BID  and Miralax 17g BID, monitor for oversoftening   - LBM 5/19, daily BMs, cont regimen   15. GI prophylaxis: continue Protonix 40mg  QD  16. Sensation of tongue swelling/dysarthria - V2/3 sensory deficits and tongue deviation on exam; discussed likely d/t sensory changes and biting tongue; patient agreeing. Monitor, SLP sees patient.    - per SLP, no deficits, no needs on discharge    LOS: 12 days A FACE TO FACE EVALUATION WAS PERFORMED  Angelina Sheriff 01/18/2023, 8:47 AM

## 2023-01-18 NOTE — Progress Notes (Signed)
Sutures removed per Md order. Patient tolerated well.  Tilden Dome, LPN

## 2023-01-19 ENCOUNTER — Ambulatory Visit: Payer: BC Managed Care – PPO | Admitting: Rehabilitative and Restorative Service Providers"

## 2023-01-24 NOTE — Therapy (Signed)
OUTPATIENT PHYSICAL THERAPY NEURO EVALUATION   Patient Name: Shawn York MRN: [id number] DOB:Apr 02, 1976, 47 y.o., male Today's Date: 01/26/2023   PCP:    Etta Grandchild, MD   REFERRING PROVIDER: Milinda Antis, PA-C  END OF SESSION:  PT End of Session - 01/26/23 1349     Visit Number 1    Number of Visits 1    Date for PT Re-Evaluation 01/26/23    Authorization Type [employer]    PT Start Time 1309    PT Stop Time 1348    PT Time Calculation (min) 39 min    Activity Tolerance Patient tolerated treatment well    Behavior During Therapy WFL for tasks assessed/performed             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   Depression    Epistaxis    Headache    Hypertension    PTSD (post-traumatic stress disorder)    Seizures (HCC)    Past Surgical History:  Procedure Laterality Date   BASAL CELL CARCINOMA EXCISION  1995   CRANIOTOMY FOR TUMOR Right 02/25/2011   IR ANGIO EXTERNAL CAROTID SEL EXT CAROTID BILAT MOD SED  11/16/2021   IR ANGIO INTRA EXTRACRAN SEL COM CAROTID INNOMINATE UNI R MOD SED  11/16/2021   IR ANGIO INTRA EXTRACRAN SEL INTERNAL CAROTID UNI L MOD SED  11/16/2021   IR ANGIO VERTEBRAL SEL VERTEBRAL UNI R MOD SED  11/16/2021   IR ANGIOGRAM FOLLOW UP STUDY  11/17/2021   IR NEURO EACH ADD'L AFTER BASIC UNI LEFT (MS)  11/17/2021   IR NEURO EACH ADD'L AFTER BASIC UNI RIGHT (MS)  11/17/2021   IR TRANSCATH/EMBOLIZ  11/16/2021   IR US GUIDE VASC ACCESS RIGHT  11/16/2021   RADIOLOGY WITH ANESTHESIA N/A 11/16/2021   Procedure: IR WITH ANESTHESIA;  Surgeon: Baldemar Lenis, MD;  Location: [facility] OR;  Service: Radiology;  Laterality: N/A;   TOTAL HIP ARTHROPLASTY Right 12/21/2021   Procedure: RIGHT TOTAL HIP REPLACEMENT;  Surgeon: Tarry Kos, MD;  Location: [facility] OR;  Service: Orthopedics;  Laterality: Right;  3-C   WISDOM TOOTH EXTRACTION      Patient Active Problem List   Diagnosis Date Noted   Depression with anxiety 01/11/2023   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   Coagulation defect, unspecified (HCC) 11/02/2022   Amnestic MCI (mild cognitive impairment with memory loss) 11/02/2022   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) 11/02/2022   Abnormal finding on MRI of brain 05/30/2022   Aneurysm of ascending aorta without rupture (HCC) 02/28/2022   Anemia due to acquired thiamine deficiency 12/01/2021   Seasonal allergic rhinitis due to pollen 11/08/2021   Family history of dissecting aortic aneurysm 11/08/2021   Contrast media allergy 11/08/2021   Avascular necrosis of bone of right hip (HCC) 11/02/2021   Avascular necrosis of bone of left hip (HCC) 11/02/2021   Primary hypertension 05/17/2021   Encounter for general adult medical examination with abnormal findings 05/17/2021   Posttraumatic stress disorder 06/05/2018   Nightmares associated with chronic post-traumatic stress disorder 09/27/2017   Brain tumor, glioma (HCC) 09/27/2017   Severe episode of recurrent major depressive disorder, without psychotic features (HCC)    Major depressive disorder, recurrent episode (HCC) 08/17/2016   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    ONSET DATE: 12/29/22  REFERRING DIAG: C71.2 (  ICD-10-CM) - Anaplastic oligodendroglioma of temporal lobe (HCC) I63.112 (ICD-10-CM) - 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)  THERAPY DIAG:  Other abnormalities of gait and mobility  Rationale for Evaluation and Treatment: Rehabilitation  SUBJECTIVE:                                                                                                                                                                                             SUBJECTIVE STATEMENT: Patient reports that he thinks that he may have broken his L hand. Reports that he forgot about his L hand while  brushing his teeth yesterday, bend over the counter and bent his knuckles back. Notes bruising over the 5th knuckle. Reports that he has good strength in the L leg. Had been dragging the L foot before, less-so lately. Reports HA's which are not aggravated by anything and denies photo/phonophobia. Gets dizzy when sitting down for a while and standing up quickly. Reports a hx of orthostasis.      Pt accompanied by: self and significant other  PERTINENT HISTORY: Anemia, avascular necrosis of B hips s/p R THA 2023, brain CA s/o R craniotomy, depression, HA, HTN, PTSD, seizures Per chart: "repeat right temporal craniotomy for resection of tumor on 12/29/2022 by Dr. Lavonna Monarch at [facility]. This was complicated by bleeding from a branch of the MCA and post-op motor deficits of left side and left facial droop. Follow up MRI showed subjacent extra axial hemorrhage with layering blood products in right temporal lobe resection cavity with small volume SAH and trace cytotoxic edema in resection cavity"   PAIN:  Are you having pain? No  PRECAUTIONS: None  WEIGHT BEARING RESTRICTIONS: No  FALLS: Has patient fallen in last 6 months? No  LIVING ENVIRONMENT: Lives with: lives with their spouse and 2 kids Lives in: House/apartment Stairs:  3 steps to enter without handrail; 1 story home Has following equipment at home: Single point cane, Environmental consultant - 2 wheeled, and None Walking, bending, lifting PLOF: Independent and Vocation/Vocational requirements: currently on medical leave; Trader Chief Strategy Officer- requiring   PATIENT GOALS: get back to using power tool to remodel home  OBJECTIVE:   DIAGNOSTIC FINDINGS: 01/17/23 brain MRI: Interval postsurgical changes from right pterional craniotomy for resection of reported right temporal oligodendroglioma. Previously seen nodular foci of contrast enhancement in the right temporal lobe are no longer visualized. 2. New T2/FLAIR hyperintense signal  abnormality in the right corona radiata with associated curvilinear contrast enhancement in these regions. This could potentially represent a region of subacute Infarct. Small volume subarachnoid hemorrhage and postoperative subdural Hematoma  OBSERVATION: mild  bruising and edema over L dorsal 5th digit  COGNITION: Overall cognitive status: Within functional limits for tasks assessed   SENSATION: WFL to light touch B LEs  COORDINATION: Alternating pronation/supination: slow on L d/t hemiplegia  Alternating toe tap: intact B Finger to nose: slow on L d/t hemiplegia  Heel to shin: intact B    MUSCLE TONE: intact B  POSTURE: rounded shoulders  LOWER EXTREMITY ROM:     Active  Right Eval Left Eval  Hip flexion    Hip extension    Hip abduction    Hip adduction    Hip internal rotation    Hip external rotation    Knee flexion    Knee extension    Ankle dorsiflexion 10 *slightly bent knee 8 *slightly bent knee  Ankle plantarflexion    Ankle inversion    Ankle eversion     (Blank rows = not tested)  LOWER EXTREMITY MMT:    MMT Right Eval Left Eval  Hip flexion 4+ 4+  Hip extension    Hip abduction 4+ 4  Hip adduction 4+ 4+  Hip internal rotation    Hip external rotation    Knee flexion 4+ 4+  Knee extension 4+ 4+  Ankle dorsiflexion 4+ 4+  Ankle plantarflexion 4+ 4+  Ankle inversion    Ankle eversion    (Blank rows = not tested)    GAIT: Gait pattern: L toe out and decreased foot clearance, causing occasional L heel catching on floor Assistive device utilized: None Level of assistance: Complete Independence   FUNCTIONAL TESTS:   [facility] PT Assessment - 01/26/23 0001       Standardized Balance Assessment   Standardized Balance Assessment 10 meter walk test;Five Times Sit to Stand    10 Meter Walk 9.83 sec   3.3 ft/sec     Functional Gait  Assessment   Gait assessed  Yes    Gait Level Surface Walks 20 ft in less than 5.5 sec, no assistive devices,  good speed, no evidence for imbalance, normal gait pattern, deviates no more than 6 in outside of the 12 in walkway width.    Change in Gait Speed Able to smoothly change walking speed without loss of balance or gait deviation. Deviate no more than 6 in outside of the 12 in walkway width.    Gait with Horizontal Head Turns Performs head turns smoothly with no change in gait. Deviates no more than 6 in outside 12 in walkway width    Gait with Vertical Head Turns Performs head turns with no change in gait. Deviates no more than 6 in outside 12 in walkway width.    Gait and Pivot Turn Pivot turns safely within 3 sec and stops quickly with no loss of balance.    Step Over Obstacle Is able to step over 2 stacked shoe boxes taped together (9 in total height) without changing gait speed. No evidence of imbalance.    Gait with Narrow Base of Support Ambulates 4-7 steps.    Gait with Eyes Closed Walks 20 ft, uses assistive device, slower speed, mild gait deviations, deviates 6-10 in outside 12 in walkway width. Ambulates 20 ft in less than 9 sec but greater than 7 sec.    Ambulating Backwards Walks 20 ft, uses assistive device, slower speed, mild gait deviations, deviates 6-10 in outside 12 in walkway width.    Steps Alternating feet, no rail.    Total Score 26  TODAY'S TREATMENT:                                                                                                                              DATE: 01/26/23    PATIENT EDUCATION: Education details: edu on orthostasis as pt reports he has a hx of this, prognosis, exam findings, HEP, edu on when to return if needed; advised to monitor L 5th digit for pain and to try ice Person educated: Patient and Spouse Education method: Explanation, Demonstration, Tactile cues, Verbal cues, and Handouts Education comprehension: verbalized understanding  HOME EXERCISE PROGRAM: Access Code: [email or web address] URL:  [email or web address] Date: 01/26/2023 Prepared by: [facility] - [facility]  Exercises - Gastroc Stretch on Wall  - 1 x daily - 5 x weekly - 2 sets - 30 sec hold - Heel Toe Raises with Counter Support  - 1 x daily - 5 x weekly - 2 sets - 10 reps - Tandem Walking with Counter Support  - 1 x daily - 5 x weekly - 2 sets - 10 reps - Heel Walking with Counter Support  - 1 x daily - 5 x weekly - 2 sets - 10 reps - Walking with Eyes Closed and Counter Support  - 1 x daily - 5 x weekly - 2 sets - 10 reps  GOALS: Goals reviewed with patient? Yes  SHORT TERM GOALS= LTGs: Target date:  01/26/23  Patient to be independent with initial HEP. Baseline: HEP initiated Goal status: MET  ASSESSMENT:  CLINICAL IMPRESSION:  Patient is a 47 y/o M presenting to OPPT s/p resection of brain tumor on 12/29/2022 which was complicated by MCA bleeding resulting in L hemi impairments. Patient participated in CIR 01/06/23-01/18/23. Patient today presenting with good stability, LE strength, LE coordination. Mild gait impairments evident. At this time patient is doing well from a mobility standpoint. Was provided HEP to address gait deviations- patient and wife reported understanding. Patient does not require PT services at this time; educated pt and wife on returning if decline occurs.    OBJECTIVE IMPAIRMENTS: Abnormal gait.   ACTIVITY LIMITATIONS: carrying, lifting, reach over head, and hygiene/grooming  PARTICIPATION LIMITATIONS: meal prep and occupation  PERSONAL FACTORS: Age, Behavior pattern, Past/current experiences, Time since onset of injury/illness/exacerbation, Transportation, and 3+ comorbidities: Anemia, avascular necrosis of B hips s/p R THA 2023, brain CA s/o R craniotomy, depression, HA, HTN, PTSD, seizures  are also affecting patient's functional outcome.   REHAB POTENTIAL: Good  CLINICAL DECISION MAKING: Evolving/moderate complexity  EVALUATION COMPLEXITY:  Moderate  PLAN:  PT FREQUENCY: one time visit  PT DURATION: -  PLANNED INTERVENTIONS: Therapeutic exercises, Therapeutic activity, Neuromuscular re-education, Balance training, Gait training, Patient/Family education, Self Care, Joint mobilization, Stair training, Vestibular training, Canalith repositioning, DME instructions, Aquatic Therapy, Dry Needling, Electrical stimulation, Cryotherapy, Moist heat, Taping, Manual therapy, and Re-evaluation  PLAN FOR NEXT SESSION: 30 day hold at this time   Anette Guarneri, PT,  DPT 01/26/23 1:57 PM  [facility] [address] [place], [address] [place], [place] [address] Phone # [phone] Fax # [phone]

## 2023-01-25 ENCOUNTER — Encounter: Payer: Self-pay | Admitting: Neurology

## 2023-01-25 DIAGNOSIS — C712 Malignant neoplasm of temporal lobe: Secondary | ICD-10-CM | POA: Diagnosis not present

## 2023-01-26 ENCOUNTER — Encounter: Payer: Self-pay | Admitting: Physical Therapy

## 2023-01-26 ENCOUNTER — Ambulatory Visit: Payer: BC Managed Care – PPO | Admitting: Occupational Therapy

## 2023-01-26 ENCOUNTER — Other Ambulatory Visit: Payer: Self-pay

## 2023-01-26 ENCOUNTER — Ambulatory Visit: Payer: BC Managed Care – PPO

## 2023-01-26 ENCOUNTER — Ambulatory Visit: Payer: BC Managed Care – PPO | Attending: Physician Assistant | Admitting: Physical Therapy

## 2023-01-26 DIAGNOSIS — R1311 Dysphagia, oral phase: Secondary | ICD-10-CM | POA: Diagnosis not present

## 2023-01-26 DIAGNOSIS — R208 Other disturbances of skin sensation: Secondary | ICD-10-CM

## 2023-01-26 DIAGNOSIS — R41840 Attention and concentration deficit: Secondary | ICD-10-CM

## 2023-01-26 DIAGNOSIS — I69354 Hemiplegia and hemiparesis following cerebral infarction affecting left non-dominant side: Secondary | ICD-10-CM

## 2023-01-26 DIAGNOSIS — R2689 Other abnormalities of gait and mobility: Secondary | ICD-10-CM | POA: Insufficient documentation

## 2023-01-26 DIAGNOSIS — I7121 Aneurysm of the ascending aorta, without rupture: Secondary | ICD-10-CM

## 2023-01-26 DIAGNOSIS — C712 Malignant neoplasm of temporal lobe: Secondary | ICD-10-CM

## 2023-01-26 DIAGNOSIS — M6281 Muscle weakness (generalized): Secondary | ICD-10-CM

## 2023-01-26 DIAGNOSIS — R278 Other lack of coordination: Secondary | ICD-10-CM | POA: Insufficient documentation

## 2023-01-26 DIAGNOSIS — I63112 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: Secondary | ICD-10-CM

## 2023-01-26 DIAGNOSIS — R41841 Cognitive communication deficit: Secondary | ICD-10-CM

## 2023-01-26 NOTE — Progress Notes (Signed)
Corrected order. Attempted to call pt. No answer. Attemtped to cancel Cardiac CTA. Will contact scheduler to discontinue order and cancel appointment.

## 2023-01-26 NOTE — Therapy (Signed)
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   Depression    Epistaxis    Headache    Hypertension    PTSD (post-traumatic stress disorder)    Seizures (HCC)    Past Surgical History:  Procedure Laterality Date   BASAL CELL CARCINOMA EXCISION  1995   CRANIOTOMY FOR TUMOR Right 02/25/2011   IR ANGIO EXTERNAL CAROTID SEL EXT CAROTID BILAT MOD SED  11/16/2021   IR ANGIO INTRA EXTRACRAN SEL COM CAROTID INNOMINATE UNI R MOD SED  11/16/2021   IR ANGIO INTRA EXTRACRAN SEL INTERNAL CAROTID UNI L MOD SED  11/16/2021   IR ANGIO VERTEBRAL SEL VERTEBRAL UNI R MOD SED  11/16/2021   IR ANGIOGRAM FOLLOW UP STUDY  11/17/2021   IR NEURO EACH ADD'L AFTER BASIC UNI LEFT (MS)  11/17/2021   IR NEURO EACH ADD'L AFTER BASIC UNI RIGHT (MS)  11/17/2021   IR TRANSCATH/EMBOLIZ  11/16/2021   IR US GUIDE VASC ACCESS RIGHT  11/16/2021   RADIOLOGY WITH ANESTHESIA N/A 11/16/2021   Procedure: IR WITH ANESTHESIA;  Surgeon: Baldemar Lenis, MD;  Location: [facility] OR;  Service: Radiology;  Laterality: N/A;   TOTAL HIP ARTHROPLASTY Right 12/21/2021   Procedure: RIGHT TOTAL HIP REPLACEMENT;  Surgeon: Tarry Kos, MD;  Location: [facility] OR;  Service: Orthopedics;  Laterality: Right;  3-C   WISDOM TOOTH EXTRACTION     Patient Active Problem List   Diagnosis Date Noted    Depression with anxiety 01/11/2023   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   Coagulation defect, unspecified (HCC) 11/02/2022   Amnestic MCI (mild cognitive impairment with memory loss) 11/02/2022   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) 11/02/2022   Abnormal finding on MRI of brain 05/30/2022   Aneurysm of ascending aorta without rupture (HCC) 02/28/2022   Anemia due to acquired thiamine deficiency 12/01/2021   Seasonal allergic rhinitis due to pollen 11/08/2021   Family history of dissecting aortic aneurysm 11/08/2021   Contrast media allergy 11/08/2021   Avascular necrosis of bone of right hip (HCC) 11/02/2021   Avascular necrosis of bone of left hip (HCC) 11/02/2021   Primary hypertension 05/17/2021   Encounter for general adult medical examination with abnormal findings 05/17/2021   Posttraumatic stress disorder 06/05/2018   Nightmares associated with chronic post-traumatic stress disorder 09/27/2017   Brain tumor, glioma (HCC) 09/27/2017   Severe episode of recurrent major depressive disorder, without psychotic features (HCC)    Major depressive disorder, recurrent episode (HCC) 08/17/2016   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    ONSET DATE: 12/29/22  REFERRING DIAG: C71.2 (ICD-10-CM) - Anaplastic oligodendroglioma of temporal lobe (HCC) I63.112 (ICD-10-CM) - Cerebral infarction due to embolism of  left vertebral artery (HCC)   THERAPY DIAG:  Cognitive communication deficit  Rationale for Evaluation and Treatment: Rehabilitation  SUBJECTIVE:   SUBJECTIVE STATEMENT: "I feel slower." Pt accompanied by: self  PERTINENT HISTORY:  Patient is a 47 year old male with history of AVN of b-hips s/p R-THR, HTN, PTSD, depression w/anxiety, ascending aortic aneurysm, 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 Adline Peals with 70% removal of tumor. Metronomic Temodar initiated.  He completed standard radiation with concurrent temozolomide and Avastin in 2013. MRI on 11/09/2022 suspicious for progression and repeat MRI done 4 weeks later found to have FLAIR lesion. He underwent repeat right temporal craniotomy for resection of tumor on 12/29/2022 by Dr. Lavonna Monarch at [facility]. This was complicated by bleeding from a branch of the MCA and post-op motor deficits of left side and left facial droop. Follow up MRI showed subjacent extra axial hemorrhage with layering blood products in right temporal lobe resection cavity with small volume SAH and trace cytotoxic edema in resection cavity. Therapy evaluations completed with recommendations for CIR. Patient admitted 01/06/23. SLP consult today due to reports of cognitive changes and biting is tongue when eating.   Pt's wife sent a message to pt's therapists via MyChart on 01/25/23 which stated: Hello Dr. Sundra Aland, this is Shawn's wife Seward York and I wanted to write to you prior to our upcoming visit tomorrow to inform you of the current situation. [name] is having a hard time following through with simple tasks and it's causing a lot of stress in the home. He does not want to hear how important it is for him to do his PT from me, but he is not doing the exercises like he should. His depression is very severe and is aiding in the current situation at home. I wanted you to know ahead of time so I don't have to bring this up in front of him at our appointment.   PAIN:  Are you having pain? No  FALLS: Has patient fallen in last 6 months?  No  LIVING ENVIRONMENT: Lives with: lives with their family; children ages 29 and 4 Lives in: House/apartment  PLOF:  Level of assistance: Independent with ADLs, Independent with IADLs Employment: Environmental education officer employment; employee at [employer]  PATIENT GOALS: Return to work  OBJECTIVE:   DIAGNOSTIC FINDINGS:  MR BRAIN WO CONTRAST December 12 2022 IMPRESSION: 1. Changes since 2020 MRI in  the residual superior Right temporal gyrus highly suspicious for Tumor Recurrence, including new mass-like T2/FLAIR hyperintense gyral expansion and small new multifocal nodular enhancement there. No intracranial mass effect 2. Also new since 2020 but chronic appearing lacunar infarcts in the right basal ganglia. 3. No other acute intracranial abnormality.   MR BRAIN WO CONTRAST  Jan 17 2023 IMPRESSION: 1. 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 Previously seen nodular foci of contrast enhancement in the right temporal lobe are no longer visualized. 2. New T2/FLAIR hyperintense signal abnormality in the right corona radiata with associated curvilinear contrast enhancement in these regions. This could potentially represent a region of subacute infarct. Recommend attention on follow-up. 3. Small volume subarachnoid hemorrhage and postoperative subdural hematoma along the right cerebral convexity, as above. 4. 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 5. Dural enhancement and thickening along the left occipital convexity and likely  the left temporal convexity, which may be due to CSF hypotension.  Discharge from SLP service:Jan 17, 2023 Patient has met 1 of 1 long term goals.  Patient to discharge at overall Modified Independent level.  Reasons goals not met: N/A  Clinical Impression/Discharge Summary: Patient has made functional gains and has met 1 of 1 LTGs this admission. Currently, patient requires extra processing time and is overall Mod I for recall of daily information with use of compensatory strategies. Patient continues to report impaired sensation of his oral-motor musculature resulting in him biting his lips and tongue intermittently. However, his  strength appears intact without evidence of dysarthria and with 100% intelligibility. Patient and family education is complete and patient will discharge home with assistance from family. Due to patient's high level of functioning and overall independence at baseline, f/u outpatient SLP services are recommended.  Care Partner:  Caregiver Able to Provide Assistance: Yes  Type of Caregiver Assistance: Cognitive;Physical Recommendation:  Outpatient SLP;24 hour supervision/assistance  Rationale for SLP Follow Up: Maximize cognitive function and independence;Reduce caregiver burden   COGNITION: Overall cognitive status: Impaired Areas of impairment:  Attention: Impaired: Alternating, Divided Awareness: Impaired: Emergent, Neglect, and Comment: Tra[place]hawn demonstrated decr'd awareness of errors (emergent awareness) especially when error was on lt side. Executive function: Impaired: Problem solving, Organization, Planning, Error awareness, Self-correction, and Slow processing Behavior: Within functional limits, despite wife's note to therapists; Pt smiled at SLP x2-3 during evaluation, and made one joking comment. Functional deficits: Pt noted he was having more difficulty paying attention after sx, and overall told SLP his thinking speed felt "slower", and that he feels he could not multi-task at work like he used to prior to [employer].   ORAL MOTOR EXAMINATION: Overall status: Impaired: Labial: Left (ROM and Sensation) upper/lower lip sensation; pt's labial ROM very slightly decreased on lt. Lingual strength, ROM were WNL  STANDARDIZED ASSESSMENTS: CLQT was initiated today. Will be completed next session.  PATIENT REPORTED OUTCOME MEASURES (PROM): Cognitive Function: to be provided to pt next session.   TODAY'S TREATMENT:                                                                                                                                         DATE:  01/26/23: SLP discussed some compensations  for mealtime as pt stated he is having difficulty feeling food on the lt labial margin during meals - setting a mirror up in front of him at meals was suggested.  Standardized eval results, as much as pt has completed, were also reviewed.  PATIENT EDUCATION: Education details: see "today's treatment" Person educated: Patient Education method: Explanation, Demonstration, and Verbal cues Education comprehension: verbalized understanding and needs further education   GOALS: Goals reviewed with patient? No  SHORT TERM GOALS: Target date: 03/03/23  Pt will demo alternating attention with min-mod complex tasks involving language with compensations 90%, in 3 sessions Baseline: Goal  status: INITIAL  2.  Pt/family will report pt completes household tasks involving language for 30 consecutive minutes with compensations if necessary, in 3sessions Baseline:  Goal status: INITIAL  3.  Pt will demo use of cognitive (memory, attention, awareness) compensations when performing detailed language tasks in order to achieve 90% success, in 3 sessions Baseline:  Goal status: INITIAL  4.  Pt will demo compensations with POs for reported labial leakage due to facial numbness on lt (including upper/lower lips)  Baseline:  Goal Status: Initial   LONG TERM GOALS: Target date: 03/31/23  Pt will demo alternating attention with mod-max complex work-like tasks involving language (using interruptions) with compensations 100%, in 3 sessions Baseline:  Goal status: INITIAL  2.  Pt/family will report pt completes household tasks involving language for 60 minutes with compensations if necessary, in 4 sessions Baseline:  Goal status: INITIAL  3.  Pt will demo knowledge of need to employ compensations when performing detailed language tasks in order to achieve 100% success, in 4 sessions Baseline:  Goal status: INITIAL  4.  Pt will tell SLP 3 compensations he may need to use at work to improve accuracy with  recall or attention Baseline:  Goal status: INITIAL  5.  Pt will score higher on cognitive PROM in the last 1-2 sessions than on initial administration  Baseline:  Goal Status: Initial   ASSESSMENT:  CLINICAL IMPRESSION: Patient is a 47 y.o. male who was seen today for assessment of cognitive linguistic function. 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 I don't think I can do that now." Pt's wife wrote to therapists stating pt was severely depressed and was not completing PT exercises at home however today, at least, pt appeared WNL behaviorally (maybe slightly flat affect), and motivated for change for cognitive linguistics. He told SLP "My stamina (would be a problem) if I had to return to work next week." Pt's cognition as it stands currently would be detrimental to his job security and pt would benefit from skilled ST to improve these skills.   OBJECTIVE IMPAIRMENTS: include attention, memory, awareness, and executive functioning. These impairments are limiting patient from return to work, managing medications, managing appointments, managing finances, household responsibilities, ADLs/IADLs, and effectively communicating at home and in community. Factors affecting potential to achieve goals and functional outcome are cooperation/participation level (according to wife, Seward York). Patient will benefit from skilled SLP services to address above impairments and improve overall function.  REHAB POTENTIAL: Good  PLAN:  SLP FREQUENCY: 2x/week  SLP DURATION: 8 weeks (or 17 sessions)  PLANNED INTERVENTIONS: Language facilitation, Environmental controls, Cueing hierachy, Cognitive reorganization, Internal/external aids, Functional tasks, SLP instruction and feedback, Compensatory strategies, and Patient/family education    [facility], CCC-SLP 01/26/2023, 10:04 PM

## 2023-01-26 NOTE — Therapy (Signed)
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   Depression    Epistaxis    Headache    Hypertension    PTSD (post-traumatic stress disorder)    Seizures (HCC)    Past Surgical History:  Procedure Laterality Date   BASAL CELL CARCINOMA EXCISION  1995   CRANIOTOMY FOR TUMOR Right 02/25/2011   IR ANGIO EXTERNAL CAROTID SEL EXT CAROTID BILAT MOD SED  11/16/2021   IR ANGIO INTRA EXTRACRAN SEL COM CAROTID INNOMINATE UNI R MOD SED  11/16/2021   IR ANGIO INTRA EXTRACRAN SEL INTERNAL CAROTID UNI L MOD SED  11/16/2021   IR ANGIO VERTEBRAL SEL VERTEBRAL UNI R MOD SED  11/16/2021   IR ANGIOGRAM FOLLOW UP STUDY  11/17/2021   IR NEURO EACH ADD'L AFTER BASIC UNI LEFT (MS)  11/17/2021   IR NEURO EACH ADD'L AFTER BASIC UNI RIGHT (MS)  11/17/2021   IR TRANSCATH/EMBOLIZ  11/16/2021   IR US GUIDE VASC ACCESS RIGHT  11/16/2021   RADIOLOGY WITH ANESTHESIA N/A 11/16/2021   Procedure: IR WITH ANESTHESIA;  Surgeon: Baldemar Lenis, MD;  Location: [facility];  Service: Radiology;  Laterality: N/A;   TOTAL HIP ARTHROPLASTY Right 12/21/2021   Procedure: RIGHT TOTAL HIP REPLACEMENT;  Surgeon: Tarry Kos, MD;  Location: [facility] OR;  Service: Orthopedics;  Laterality: Right;  3-C   WISDOM TOOTH EXTRACTION     Patient Active Problem List   Diagnosis Date Noted   Depression with anxiety 01/11/2023   Oligodendroglioma (HCC)  01/06/2023   Coagulation defect, unspecified (HCC) 11/02/2022   Amnestic MCI (mild cognitive impairment with memory loss) 11/02/2022   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) 11/02/2022   Abnormal finding on MRI of brain 05/30/2022   Aneurysm of ascending aorta without rupture (HCC) 02/28/2022   Anemia due to acquired thiamine deficiency 12/01/2021   Seasonal allergic rhinitis due to pollen 11/08/2021   Family history of dissecting aortic aneurysm 11/08/2021   Contrast media allergy 11/08/2021   Avascular necrosis of bone of right hip (HCC) 11/02/2021   Avascular necrosis of bone of left hip (HCC) 11/02/2021   Primary hypertension 05/17/2021   Encounter for general adult medical examination with abnormal findings 05/17/2021   Posttraumatic stress disorder 06/05/2018   Nightmares associated with chronic post-traumatic stress disorder 09/27/2017   Brain tumor, glioma (HCC) 09/27/2017   Severe episode of recurrent major depressive disorder, without psychotic features (HCC)    Major depressive disorder, recurrent episode (HCC) 08/17/2016   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    ONSET DATE: 12/29/22  REFERRING DIAG: C71.2 (ICD-10-CM) - Anaplastic oligodendroglioma of temporal lobe (HCC) I63.112 (ICD-10-CM) - 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  THERAPY DIAG:  Cerebral infarction  due to embolism of left vertebral artery (HCC)  Hemiplegia and hemiparesis following cerebral infarction affecting left non-dominant side (HCC)  Muscle weakness (generalized)  Other disturbances of skin sensation  Other lack of coordination  Attention and concentration deficit  Rationale for Evaluation and Treatment: Rehabilitation  SUBJECTIVE:   SUBJECTIVE STATEMENT: "This (point to L) arm is a big challenge.  I will tell it to do something and it won't." Reports decreased sensation, limited FMC, limited ability to grade  pressure.  Spouse reports that when attempting to complete bimanual tasks, LUE may start task with setup but will not complete task as RUE. Pt accompanied by: self and significant other (Spouse, Maggie)  PERTINENT HISTORY: 47 year old male with history of AVN of b-hips s/p R-THR, HTN, PTSD, depression w/anxiety, ascending aortic aneurysm, 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 Adline Peals with 70% removal of tumor. Metronomic Temodar initiated. He completed standard radiation with concurrent temozolomide and Avastin in 2013. MRI on 11/09/2022 suspicious for progression and repeat MRI done 4 weeks later found to have FLAIR lesion. He underwent repeat right temporal craniotomy for resection of tumor on 12/29/2022 by Dr. Lavonna Monarch at [facility]. This was complicated by bleeding from a branch of the MCA and post-op motor deficits of left side and left facial droop. Follow up MRI showed subjacent extra axial hemorrhage with layering blood products in right temporal lobe resection cavity with small volume SAH and trace cytotoxic edema in resection cavity.  PRECAUTIONS: no lifting or strenuous exercise until cleared by MD  WEIGHT BEARING RESTRICTIONS: No  PAIN:  Are you having pain? Yes: NPRS scale: 7/10 Pain location: head, neck, ear Pain description: burning, itching Aggravating factors: touching head/face Relieving factors: not touching it  FALLS: Has patient fallen in last 6 months? 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 Difficulty with tying shoes/draw string and buttoning  Toileting: increased time/effort when tearing toilet paper Bathing: difficulty with use of LUE when washing R side of body Tub Shower transfers: Mod I stepping over tub ledge Equipment: Transfer tub bench  IADLs: Light housekeeping: attempted to feed dogs and dropped bowl from L hand Meal Prep: not currently cooking Community mobility: not cleared to drive Medication management: pt has a system and is able to remember to take as prescribed, difficulty with opening pill bottles  MOBILITY STATUS: Independent  POSTURE COMMENTS:  No Significant postural limitations  ACTIVITY TOLERANCE: Activity tolerance: WFL for tasks assessed  FUNCTIONAL OUTCOME MEASURES: Upper Extremity Functional Scale (UEFS): TBD  UPPER EXTREMITY ROM:  RUE: WFL; LUE: Has active movement - can reach overhead  and internal rotation with increased time/effort - difficulty sustaining, and increased compensations noted with fatigue   UPPER EXTREMITY MMT:   grossly 4+ to 5/5 bilaterally  HAND FUNCTION: TBD  COORDINATION: Box and Blocks:  Right 42 blocks, Left 12 blocks  SENSATION: Light touch: Impaired  Stereognosis: Impaired  Proprioception: Impaired   COGNITION: Overall cognitive status: Impaired; impaired memory with decreased recall of new information and impaired selective attention  VISION: Subjective report: bumps into items on L side Baseline vision:  wears  glasses  VISION ASSESSMENT: Gaze preference/alignment: WDL Tracking/Visual pursuits: Able to track stimulus in all quads without difficulty Visual Fields: 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 Educated on importance of looking up and forward instead of to floor during ambulation to attend to items to compensate for upper L quadrant (homonymous quadranopsia) inattention.  Completed clock drawing and line bisection with no L inattention.  Completed confrontation testing with impairment in L upper quadrant.  PATIENT EDUCATION: Education details: Educated on role and purpose of OT as well as potential interventions and goals for therapy based on initial evaluation findings. Person educated: Patient and Spouse Education method: Explanation, Demonstration, and Handouts Education comprehension: verbalized understanding and needs further education  HOME EXERCISE PROGRAM: TBD   GOALS: Goals reviewed with patient? No  SHORT TERM GOALS: Target date: 02/24/23  Pt will be independent in HEP for LUE coordination and ROM. Baseline: Goal status: INITIAL  2.  Pt will verbalize understanding of task modifications and/or potential AE needs to increase ease, safety, and independence w/ ADLs. Baseline:  Goal status: INITIAL  3.  Patient will demonstrate improved awareness of visual management strategies for L inattention. Baseline:  Goal status: INITIAL  4.  Pt will demonstrate improved UE functional use  for ADLs as evidenced by increasing box/ blocks score by 4 blocks with LUE Baseline: Box and Blocks:  Right 42 blocks, Left 12 blocks Goal status: INITIAL   LONG TERM GOALS: Target date: 03/24/23  Pt will demonstrate improved UE functional use for ADLs as evidenced by increasing box/ blocks score by 8 blocks with LUE Baseline: Box and Blocks:  Right 42 blocks, Left 12 blocks Goal status: INITIAL  2.  Pt will demonstrate improved fine motor coordination for ADLs as evidenced by being able to complete 9 hole peg test with LUE in 2 min time limit. Baseline:  Goal status: INITIAL  3.  Pt will demonstrate improved awareness of safety considerations and AE needs to ensure safety d/t decreased sensation Baseline:  Goal status: INITIAL  4.  Pt will demonstrate improved attention to LUE to allow for sustained grasp to transport household items 25' to increase engagement in IADLs. Baseline:  Goal status: INITIAL  5.  Pt will demonstrate improved functional use of LUE as evidenced by improved score by TBD on UEFS. Baseline: TBD Goal status: INITIAL   ASSESSMENT:  CLINICAL IMPRESSION: Patient is a 47 y.o. male who was seen today for occupational therapy evaluation for LUE weakness, impaired sensation and coordination, and L inattention s/p right temporal craniotomy for resection of tumor with hemorrhage in right temporal lobe s/p craniotomy.  Pt currently lives with spouse and 2 young children in a single level home with 3 steps to enter and was working as at [employer] as Conservation officer, nature, unloading trucks, breaking down pallets, etc. prior to onset.  Pt will benefit from skilled occupational therapy services to address strength and coordination, ROM, pain management, altered sensation, GM/FM control, cognition, safety awareness, introduction of compensatory strategies/AE prn, visual-perception, and implementation of an HEP to improve participation and safety during ADLs and IADLs.   PERFORMANCE  DEFICITS: in functional skills including ADLs, IADLs, coordination, dexterity, proprioception, sensation, ROM, strength, pain, Fine motor control, Gross motor control, endurance, vision, and UE functional use, cognitive skills including attention and memory, and psychosocial skills including coping strategies and environmental adaptation.   IMPAIRMENTS: are limiting patient from ADLs, IADLs, and work.   CO-MORBIDITIES: may have co-morbidities  that affects occupational performance. Patient will benefit from skilled OT to address above impairments and improve overall function.  MODIFICATION OR ASSISTANCE TO COMPLETE EVALUATION: Min-Moderate modification of tasks or assist with assess necessary to complete an evaluation.  OT OCCUPATIONAL PROFILE AND HISTORY: Detailed assessment: Review of records and additional review of physical, cognitive, psychosocial history related to current functional performance.  CLINICAL DECISION MAKING: Moderate - several treatment options, min-mod task modification necessary  REHAB POTENTIAL: Good  EVALUATION COMPLEXITY: Moderate    PLAN:  OT FREQUENCY: 2x/week  OT DURATION: 8 weeks  PLANNED INTERVENTIONS: self care/ADL training, therapeutic exercise, therapeutic activity, neuromuscular re-education, manual therapy, passive range of motion, functional mobility training, splinting, electrical stimulation, ultrasound, compression bandaging, moist heat, cryotherapy, patient/family education, cognitive remediation/compensation, visual/perceptual remediation/compensation, psychosocial skills training, energy conservation, coping strategies training, and DME and/or AE instructions  RECOMMENDED OTHER SERVICES: NA  CONSULTED AND AGREED WITH PLAN OF CARE: Patient and family member/caregiver  PLAN FOR NEXT SESSION: Complete UEFS, assess grip strength, further assess sensation, Initiate WB/NMR for LUE, reiterate L inattention and safety considerations   Raju Coppolino,  OTR/L 01/27/2023, 8:03 AM

## 2023-01-27 DIAGNOSIS — F329 Major depressive disorder, single episode, unspecified: Secondary | ICD-10-CM | POA: Diagnosis not present

## 2023-01-27 DIAGNOSIS — F4310 Post-traumatic stress disorder, unspecified: Secondary | ICD-10-CM | POA: Diagnosis not present

## 2023-01-30 DIAGNOSIS — F329 Major depressive disorder, single episode, unspecified: Secondary | ICD-10-CM | POA: Diagnosis not present

## 2023-01-30 DIAGNOSIS — F4310 Post-traumatic stress disorder, unspecified: Secondary | ICD-10-CM | POA: Diagnosis not present

## 2023-01-30 NOTE — Therapy (Signed)
OUTPATIENT OCCUPATIONAL THERAPY NEURO TREATMENT NOTE  Patient Name: Shawn York MRN: [id number] DOB:July 16, 1976, 47 y.o., male Today's Date: 02/01/2023  PCP: Etta Grandchild, MD REFERRING PROVIDER: Milinda Antis, PA-C  END OF SESSION:  OT End of Session - 02/01/23 1450     Visit Number 2    Number of Visits 17    Date for OT Re-Evaluation 03/24/23    Authorization Type [employer]    OT Start Time 1450    OT Stop Time 1541    OT Time Calculation (min) 51 min    Activity Tolerance No increased pain;Patient tolerated treatment well;Patient limited by fatigue;Patient limited by pain    Behavior During Therapy [facility] for tasks assessed/performed             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   Depression    Epistaxis    Headache    Hypertension    PTSD (post-traumatic stress disorder)    Seizures (HCC)    Past Surgical History:  Procedure Laterality Date   BASAL CELL CARCINOMA EXCISION  1995   CRANIOTOMY FOR TUMOR Right 02/25/2011   IR ANGIO EXTERNAL CAROTID SEL EXT CAROTID BILAT MOD SED  11/16/2021   IR ANGIO INTRA EXTRACRAN SEL COM CAROTID INNOMINATE UNI R MOD SED  11/16/2021   IR ANGIO INTRA EXTRACRAN SEL INTERNAL CAROTID UNI L MOD SED  11/16/2021   IR ANGIO VERTEBRAL SEL VERTEBRAL UNI R MOD SED  11/16/2021   IR ANGIOGRAM FOLLOW UP STUDY  11/17/2021   IR NEURO EACH ADD'L AFTER BASIC UNI LEFT (MS)  11/17/2021   IR NEURO EACH ADD'L AFTER BASIC UNI RIGHT (MS)  11/17/2021   IR TRANSCATH/EMBOLIZ  11/16/2021   IR US GUIDE VASC ACCESS RIGHT  11/16/2021   RADIOLOGY WITH ANESTHESIA N/A 11/16/2021   Procedure: IR WITH ANESTHESIA;  Surgeon: Baldemar Lenis, MD;  Location: [facility] OR;  Service: Radiology;  Laterality: N/A;   TOTAL HIP ARTHROPLASTY Right 12/21/2021   Procedure: RIGHT TOTAL HIP REPLACEMENT;  Surgeon: Tarry Kos, MD;  Location: [facility] OR;  Service:  Orthopedics;  Laterality: Right;  3-C   WISDOM TOOTH EXTRACTION     Patient Active Problem List   Diagnosis Date Noted   Depression with anxiety 01/11/2023   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   Coagulation defect, unspecified (HCC) 11/02/2022   Amnestic MCI (mild cognitive impairment with memory loss) 11/02/2022   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) 11/02/2022   Abnormal finding on MRI of brain 05/30/2022   Aneurysm of ascending aorta without rupture (HCC) 02/28/2022   Anemia due to acquired thiamine deficiency 12/01/2021   Seasonal allergic rhinitis due to pollen 11/08/2021   Family history of dissecting aortic aneurysm 11/08/2021   Contrast media allergy 11/08/2021   Avascular necrosis of bone of right hip (HCC) 11/02/2021   Avascular necrosis of bone of left hip (HCC) 11/02/2021   Primary hypertension 05/17/2021   Encounter for general adult medical examination with abnormal findings 05/17/2021   Posttraumatic stress disorder 06/05/2018   Nightmares associated with chronic post-traumatic stress disorder 09/27/2017   Brain tumor, glioma (HCC) 09/27/2017   Severe episode of recurrent major depressive disorder, without psychotic features (HCC)    Major depressive disorder, recurrent episode (HCC) 08/17/2016   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    ONSET DATE: 12/29/22 DOS craniotomy  and tumor resection   REFERRING DIAG: C71.2 (ICD-10-CM) - Anaplastic oligodendroglioma of temporal lobe (HCC) I63.112 (ICD-10-CM) - 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  THERAPY DIAG:  Hemiplegia and hemiparesis following cerebral infarction affecting left non-dominant side (HCC)  Other lack of coordination  Muscle weakness (generalized)  Attention and concentration deficit  Rationale for Evaluation and Treatment: Rehabilitation  PERTINENT HISTORY: 47 year old male with history of AVN of b-hips s/p R-THR, HTN, PTSD,  depression w/anxiety, ascending aortic aneurysm, 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 Adline Peals with 70% removal of tumor. Metronomic Temodar initiated. He completed standard radiation with concurrent temozolomide and Avastin in 2013. MRI on 11/09/2022 suspicious for progression and repeat MRI done 4 weeks later found to have FLAIR lesion. He underwent repeat right temporal craniotomy for resection of tumor on 12/29/2022 by Dr. Lavonna Monarch at [facility]. This was complicated by bleeding from a branch of the MCA and post-op motor deficits of left side and left facial droop. Follow up MRI showed subjacent extra axial hemorrhage with layering blood products in right temporal lobe resection cavity with small volume SAH and trace cytotoxic edema in resection cavity.  "This (point to L) arm is a big challenge.  I will tell it to do something and it won't." Reports decreased sensation, limited FMC, limited ability to grade pressure.  Spouse reports that when attempting to complete bimanual tasks, LUE may start task with setup but will not complete task as RUE.  PRECAUTIONS: no lifting or strenuous exercise until cleared by MD  WEIGHT BEARING RESTRICTIONS: No   SUBJECTIVE:   SUBJECTIVE STATEMENT: He states touch to face improving, but now hand is cold and numb and sensitive a bit.    Pt accompanied by: self and his mom, Lupita Leash  PAIN:  Are you having pain? Yes: NPRS scale: 1-2/10 Pain location: Lt hand feels cold and mild nerve pain  FALLS: Has patient fallen in last 6 months? 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, break down pallets, Conservation officer, nature  PATIENT GOALS: to be able to play guitar again, not drop things   OBJECTIVE: (All objective assessments below are from initial evaluation on: 01/26/23 unless otherwise specified.)   HAND DOMINANCE: Right  ADLs: Transfers/ambulation related to ADLs: Mod I without AD Eating: would need assist to cut meat, knocks items off table with L hand (use of 2 hands to stabilize and scoop) Grooming: utilizing LUE as gross assist to hold items but not utilizing during task UB Dressing: increased effort LB Dressing: increased effort, attempting to utilize LUE however it will frequently drop things. Difficulty with tying shoes/draw string and buttoning  Toileting: increased time/effort when tearing toilet paper Bathing: difficulty with use of LUE when washing R side of body Tub Shower transfers: Mod I stepping over tub ledge Equipment: Transfer tub bench  IADLs: Light housekeeping: attempted to feed dogs and dropped bowl from L hand Meal Prep: not currently cooking Community mobility: not cleared to drive Medication management: pt has a system and is able to remember to take as prescribed, difficulty with opening pill bottles  MOBILITY  STATUS: Independent  POSTURE COMMENTS:  No Significant postural limitations  ACTIVITY TOLERANCE: Activity tolerance: WFL for tasks assessed  FUNCTIONAL OUTCOME MEASURES: Upper Extremity Functional Scale (UEFS): 18/80 or 22% functional (rated sole use of Lt arm)  UPPER EXTREMITY ROM:  RUE: WFL; LUE: Has active movement - can reach overhead and internal rotation with increased time/effort - difficulty sustaining, and increased compensations noted with fatigue   UPPER EXTREMITY MMT:   grossly 4+ to 5/5 bilaterally  HAND FUNCTION: 02/01/23: Grip: Rt: 125#, Lt: 53#  COORDINATION: Box and Blocks:  Right 42 blocks, Left 12 blocks  SENSATION: 02/01/23: OT tries  Semmes-Weinstein Testing and he cannot perceive 6.65 (largest) most of the time globally in Lt hand, and so had diminished or no protective sensation and was cautioned about that.   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  We discuss inattention to the left side and the possible safety risks that this assumes.  Additionally OT attempted to check his sensation with the monofilaments and he is unable to perceive even the thickest in most parts of his left hand and arm.  OT discusses this lack of protective sensation that he could burn himself or cut himself without knowing and to use caution at home and stay away from sharp or hot objects.  He states understanding and he does have a small cut on his ulnar wrist today.  His grip strength was checked and he does have more than 50 pounds which is encouraging, but much less than his right hand.  OT discusses, in depth, about sensorimotor facilitation and cutaneous  stimulation and the patient trials vibration use, brushing with a towel and both of these things are initially painful and then feel relieving over time.  He is recommended to do this multiple times a day to help reeducate his nerves.  Additionally OT educates on weightbearing exercises "developmental postures" today for neuromuscular reeducation to be done with core training, cross body reaching, bilateral extremity use, ipsilateral extremity use supine on the plinth or in bed performing flexor withdrawal, rolling from supine to side-lying, weightbearing and prone on elbows with  weight shifts and scapular prone retraction.  He is even able to push up onto hands and knees and do "half push-ups" today.  The entire time he was encouraged to be breathing as not to perform Valsalva maneuver or "bear down," as this is contraindicated now.  He was asymptomatic the whole time and has no issues with these, was educated to perform 2-3 times a day at home for 15 minutes at a time with breaks as needed.  OT also noted tightness through his arm but did not have much time to discuss stretches today.  He states understanding what was discussed so far.   01/26/23 Educated on Light house scanning and hand placement and/or reminders to scan L with table top tasks and reading. Educated on importance of looking up and forward instead of to floor during ambulation to attend to items to compensate for upper L quadrant (homonymous quadranopsia) inattention.  Completed clock drawing and line bisection with no L inattention.  Completed confrontation testing with impairment in L upper quadrant.  PATIENT EDUCATION: Education details: Educated on role and purpose of OT as well as potential interventions and goals for therapy based on initial evaluation findings. Person educated: Patient and Spouse Education method: Explanation, Demonstration, and Handouts Education comprehension: verbalized understanding and needs further  education  HOME EXERCISE PROGRAM: Developmental Motor Sequence (Rood) Do each 2-3 x day, don't hold breath, slowly, 5-10x each Crunches with arms and legs (Flexor-withdrawl) Roll over (toward unaffected side)  "Superman" extension exercises (Pivot prone)       4. Push up/down and Rock side/side on elbows       5. Push up to get to hands and knees    GOALS: Goals reviewed with patient? No  SHORT TERM GOALS: Target date: 02/24/23  Pt will be independent in HEP for LUE coordination and ROM. Baseline: Goal status: INITIAL  2.  Pt will verbalize understanding of task modifications and/or potential AE needs to increase ease, safety, and independence w/ ADLs. Baseline:  Goal status: INITIAL  3.  Patient will demonstrate improved awareness of visual management strategies for L inattention. Baseline:  Goal status: INITIAL  4.  Pt will demonstrate improved UE functional use for ADLs as evidenced by increasing box/ blocks score by 4 blocks with LUE Baseline: Box and Blocks:  Right 42 blocks, Left 12 blocks Goal status: INITIAL   LONG TERM GOALS: Target date: 03/24/23  Pt will demonstrate improved UE functional use for ADLs as evidenced by increasing box/ blocks score by 8 blocks with LUE Baseline: Box and Blocks:  Right 42 blocks, Left 12 blocks Goal status: INITIAL  2.  Pt will demonstrate improved fine motor coordination for ADLs as evidenced by being able to complete 9 hole peg test with LUE in 2 min time limit. Baseline:  Goal status: INITIAL  3.  Pt will demonstrate improved awareness of safety considerations and AE needs to ensure safety d/t decreased sensation Baseline:  Goal status: INITIAL  4.  Pt will demonstrate improved attention to LUE to allow for sustained grasp to transport household items 25' to increase engagement in IADLs. Baseline:  Goal status: INITIAL  5.  Pt will demonstrate improved functional use of LUE as evidenced by improved score by TBD on  UEFS. Baseline: TBD Goal status: INITIAL   ASSESSMENT:  CLINICAL IMPRESSION: 02/01/23: Doing very well so far seems to be limited by gross and mild spastic tightness in a chain down his arm, largely following radial nerve path.Needs to continue  to work on coordination, sensation issues, and overall tightness from shoulder to wrist and hand.    Eval: Patient is a 47 y.o. male who was seen today for occupational therapy evaluation for LUE weakness, impaired sensation and coordination, and L inattention s/p right temporal craniotomy for resection of tumor with hemorrhage in right temporal lobe s/p craniotomy.  Pt currently lives with spouse and 2 young children in a single level home with 3 steps to enter and was working as at [employer] as Conservation officer, nature, unloading trucks, breaking down pallets, etc. prior to onset.  Pt will benefit from skilled occupational therapy services to address strength and coordination, ROM, pain management, altered sensation, GM/FM control, cognition, safety awareness, introduction of compensatory strategies/AE prn, visual-perception, and implementation of an HEP to improve participation and safety during ADLs and IADLs.    PLAN:  OT FREQUENCY: 2x/week  OT DURATION: 8 weeks  PLANNED INTERVENTIONS: self care/ADL training, therapeutic exercise, therapeutic activity, neuromuscular re-education, manual therapy, passive range of motion, functional mobility training, splinting, electrical stimulation, ultrasound, compression bandaging, moist heat, cryotherapy, patient/family education, cognitive remediation/compensation, visual/perceptual remediation/compensation, psychosocial skills training, energy conservation, coping strategies training, and DME and/or AE instructions  CONSULTED AND AGREED WITH PLAN OF CARE: Patient and family member/caregiver  PLAN FOR NEXT SESSION:  Review supine developmental postures on plinth for weightbearing and neuro re-ed.  Review concepts of  desensitization and sensory facilitation. Attempt to provide comprehensive stretch program from shoulder to wrist and hand, also edu on nerve glides- especially for radial nerve.  Work on [employer] and [employer] as well  Fannie Knee, OTR/L 02/01/2023, 5:07 PM

## 2023-02-01 ENCOUNTER — Encounter: Payer: Self-pay | Admitting: Rehabilitative and Restorative Service Providers"

## 2023-02-01 ENCOUNTER — Ambulatory Visit
Payer: BC Managed Care – PPO | Attending: Physician Assistant | Admitting: Rehabilitative and Restorative Service Providers"

## 2023-02-01 ENCOUNTER — Ambulatory Visit: Payer: BC Managed Care – PPO

## 2023-02-01 ENCOUNTER — Ambulatory Visit (HOSPITAL_COMMUNITY): Payer: BC Managed Care – PPO

## 2023-02-01 DIAGNOSIS — R4701 Aphasia: Secondary | ICD-10-CM | POA: Diagnosis not present

## 2023-02-01 DIAGNOSIS — I69354 Hemiplegia and hemiparesis following cerebral infarction affecting left non-dominant side: Secondary | ICD-10-CM | POA: Diagnosis not present

## 2023-02-01 DIAGNOSIS — R41841 Cognitive communication deficit: Secondary | ICD-10-CM

## 2023-02-01 DIAGNOSIS — R278 Other lack of coordination: Secondary | ICD-10-CM | POA: Diagnosis not present

## 2023-02-01 DIAGNOSIS — M6281 Muscle weakness (generalized): Secondary | ICD-10-CM | POA: Insufficient documentation

## 2023-02-01 DIAGNOSIS — R41840 Attention and concentration deficit: Secondary | ICD-10-CM | POA: Insufficient documentation

## 2023-02-01 DIAGNOSIS — R1311 Dysphagia, oral phase: Secondary | ICD-10-CM | POA: Insufficient documentation

## 2023-02-01 DIAGNOSIS — R208 Other disturbances of skin sensation: Secondary | ICD-10-CM | POA: Insufficient documentation

## 2023-02-01 NOTE — Therapy (Signed)
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   Depression    Epistaxis    Headache    Hypertension    PTSD (post-traumatic stress disorder)    Seizures (HCC)    Past Surgical History:  Procedure Laterality Date   BASAL CELL CARCINOMA EXCISION  1995   CRANIOTOMY FOR TUMOR Right 02/25/2011   IR ANGIO EXTERNAL CAROTID SEL EXT CAROTID BILAT MOD SED  11/16/2021   IR ANGIO INTRA EXTRACRAN SEL COM CAROTID INNOMINATE UNI R MOD SED  11/16/2021   IR ANGIO INTRA EXTRACRAN SEL INTERNAL CAROTID UNI L MOD SED  11/16/2021   IR ANGIO VERTEBRAL SEL VERTEBRAL UNI R MOD SED  11/16/2021   IR ANGIOGRAM FOLLOW UP STUDY  11/17/2021   IR NEURO EACH ADD'L AFTER BASIC UNI LEFT (MS)  11/17/2021   IR NEURO EACH ADD'L AFTER BASIC UNI RIGHT (MS)  11/17/2021   IR TRANSCATH/EMBOLIZ  11/16/2021   IR US GUIDE VASC ACCESS RIGHT  11/16/2021   RADIOLOGY WITH ANESTHESIA N/A 11/16/2021   Procedure: IR WITH ANESTHESIA;  Surgeon: Baldemar Lenis, MD;  Location: [facility] OR;  Service: Radiology;  Laterality: N/A;   TOTAL HIP ARTHROPLASTY Right 12/21/2021   Procedure: RIGHT TOTAL HIP REPLACEMENT;  Surgeon: Tarry Kos, MD;  Location: [facility] OR;  Service: Orthopedics;  Laterality: Right;  3-C   WISDOM TOOTH EXTRACTION     Patient Active Problem List   Diagnosis Date Noted    Depression with anxiety 01/11/2023   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   Coagulation defect, unspecified (HCC) 11/02/2022   Amnestic MCI (mild cognitive impairment with memory loss) 11/02/2022   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) 11/02/2022   Abnormal finding on MRI of brain 05/30/2022   Aneurysm of ascending aorta without rupture (HCC) 02/28/2022   Anemia due to acquired thiamine deficiency 12/01/2021   Seasonal allergic rhinitis due to pollen 11/08/2021   Family history of dissecting aortic aneurysm 11/08/2021   Contrast media allergy 11/08/2021   Avascular necrosis of bone of right hip (HCC) 11/02/2021   Avascular necrosis of bone of left hip (HCC) 11/02/2021   Primary hypertension 05/17/2021   Encounter for general adult medical examination with abnormal findings 05/17/2021   Posttraumatic stress disorder 06/05/2018   Nightmares associated with chronic post-traumatic stress disorder 09/27/2017   Brain tumor, glioma (HCC) 09/27/2017   Severe episode of recurrent major depressive disorder, without psychotic features (HCC)    Major depressive disorder, recurrent episode (HCC) 08/17/2016   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    ONSET DATE: 12/29/22  REFERRING DIAG: C71.2 (ICD-10-CM) - Anaplastic oligodendroglioma of temporal lobe (HCC) I63.112 (ICD-10-CM) - Cerebral infarction due to embolism  of left vertebral artery (HCC)   THERAPY DIAG:  Cognitive communication deficit  Dysphagia, oral phase  Rationale for Evaluation and Treatment: Rehabilitation  SUBJECTIVE:   SUBJECTIVE STATEMENT: "I have to wake up this (lt) arm." (Pt, re: takeaway from OT, earlier) Pt accompanied by: self  PERTINENT HISTORY:  Patient is a 47 year old male with history of AVN of b-hips s/p R-THR, HTN, PTSD, depression w/anxiety, ascending aortic aneurysm, anaplastic oligodendroglioma diagnosed in May of 2012 and underwent right temporal craniotomy  by Shawn York with 70% removal of tumor. Metronomic Temodar initiated. He completed standard radiation with concurrent temozolomide and Avastin in 2013. MRI on 11/09/2022 suspicious for progression and repeat MRI done 4 weeks later found to have FLAIR lesion. He underwent repeat right temporal craniotomy for resection of tumor on 12/29/2022 by Shawn York at [facility] This was complicated by bleeding from a branch of the MCA and post-op motor deficits of left side and left facial droop. Follow up MRI showed subjacent extra axial hemorrhage with layering blood products in right temporal lobe resection cavity with small volume SAH and trace cytotoxic edema in resection cavity. Therapy evaluations completed with recommendations for CIR. Patient admitted 01/06/23. SLP consult today due to reports of cognitive changes and biting is tongue when eating.   Pt's wife sent a message to pt's therapists via MyChart on 01/25/23 which stated: Hello Shawn York, this is Shawn York's wife Shawn York and I wanted to write to you prior to our upcoming visit tomorrow to inform you of the current situation. Shawn York is having a hard time following through with simple tasks and it's causing a lot of stress in the home. He does not want to hear how important it is for him to do his PT from me, but he is not doing the exercises like he should. His depression is very severe and is aiding in the current situation at home. I wanted you to know ahead of time so I don't have to bring this up in front of him at our appointment.   PAIN:  Are you having pain? No  FALLS: Has patient fallen in last 6 months?  No  PATIENT GOALS: Return to work  OBJECTIVE:   DIAGNOSTIC FINDINGS:  MR BRAIN WO CONTRAST December 12 2022 IMPRESSION: 1. Changes since 2020 MRI in the residual superior Right temporal gyrus highly suspicious for Tumor Recurrence, including new mass-like T2/FLAIR hyperintense gyral expansion and small  new multifocal nodular enhancement there. No intracranial mass effect 2. Also new since 2020 but chronic appearing lacunar infarcts in the right basal ganglia. 3. No other acute intracranial abnormality.   MR BRAIN WO CONTRAST  Jan 17 2023 IMPRESSION: 1. 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 Previously seen nodular foci of contrast enhancement in the right temporal lobe are no longer visualized. 2. New T2/FLAIR hyperintense signal abnormality in the right corona radiata with associated curvilinear contrast enhancement in these regions. This could potentially represent a region of subacute infarct. Recommend attention on follow-up. 3. Small volume subarachnoid hemorrhage and postoperative subdural hematoma along the right cerebral convexity, as above. 4. 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 5. Dural enhancement and thickening along the left occipital convexity and likely the left temporal convexity, which may be due to CSF hypotension.  Discharge from SLP service:Jan 17, 2023 Patient has  met 1 of 1 long term goals.  Patient to discharge at overall Modified Independent level.  Reasons goals not met: N/A  Clinical Impression/Discharge Summary: Patient has made functional gains and has met 1 of 1 LTGs this admission. Currently, patient requires extra processing time and is overall Mod I for recall of daily information with use of compensatory strategies. Patient continues to report impaired sensation of his oral-motor musculature resulting in him biting his lips and tongue intermittently. However, his strength appears intact without evidence of dysarthria and with 100% intelligibility. Patient and family education is complete and patient will discharge home  with assistance from family. Due to patient's high level of functioning and overall independence at baseline, f/u outpatient SLP services are recommended.  Care Partner:  Caregiver Able to Provide Assistance: Yes  Type of Caregiver Assistance: Cognitive;Physical Recommendation:  Outpatient SLP;24 hour supervision/assistance  Rationale for SLP Follow Up: Maximize cognitive function and independence;Reduce caregiver burden   COGNITION: Overall cognitive status: Impaired Areas of impairment:  Attention: Impaired: Alternating, Divided Awareness: Impaired: Emergent, Neglect, and Comment: Jusin demonstrated decr'd awareness of errors (emergent awareness) especially when error was on lt side. Executive function: Impaired: Problem solving, Organization, Planning, Error awareness, Self-correction, and Slow processing Behavior: Within functional limits, despite wife's note to therapists; Pt smiled at SLP x2-3 during evaluation, and made one joking comment. Functional deficits: Pt noted he was having more difficulty paying attention after sx, and overall told SLP his thinking speed felt "slower", and that he feels he could not multi-task at work like he used to prior to [employer].    STANDARDIZED ASSESSMENTS: CLQT was completed. Result below: The Cognitive Linguistic Quick Test (CLQT) was administered to assess the relative status of five cognitive domains: attention, memory, language, executive functioning, and visuospatial skills. Scores from 10 tasks were used to estimate severity ratings (standardized for age groups 18-69 years and 70-89 years) for each domain, a clock drawing task, as well as an overall composite severity rating of cognition.      Task Score Criterion Cut Scores  Personal Facts 8/8 8  Symbol Cancellation 12/12 11  Confrontation Naming 10/10 10  Clock Drawing  12/13 12  Story Retelling 9/10 6  Symbol Trails 6/10 9  Generative Naming 4/9 5  Design Memory 4/6 5  Mazes  7/8 7  Design  Generation 8/13 6    Cognitive Domain Composite Score Severity Rating  Attention 188/215 WNL  Memory 184/185 High Mild  Executive Function 25/40 Low WNL  Language 31/37 WNL  Visuospatial Skills 81/105 High Mild  Clock Drawing  12/13 WNL  Composite Severity Rating 3.6 Low WNL           PATIENT REPORTED OUTCOME MEASURES (PROM): Cognitive Function: was given to pt 02/01/23 and asked to return next session   TODAY'S TREATMENT:  DATE:  02/01/23: SLP completed the CLQT and discussed results with pt. Organization, planning, memory, and attention appear to be pt's deficit areas for SLP to target. Results are above.  01/26/23: SLP discussed some compensations for mealtime as pt stated he is having difficulty feeling food on the lt labial margin during meals - setting a mirror up in front of him at meals was suggested.  Standardized eval results, as much as pt has completed, were also reviewed.  PATIENT EDUCATION: Education details: see "today's treatment" Person educated: Patient Education method: Explanation, Demonstration, and Verbal cues Education comprehension: verbalized understanding and needs further education   GOALS: Goals reviewed with patient? No  SHORT TERM GOALS: Target date: 03/03/23  Pt will demo alternating attention with min-mod complex tasks involving language with compensations 90%, in 3 sessions Baseline: Goal status: INITIAL  2.  Pt/family will report pt completes household tasks involving language for 30 consecutive minutes with compensations if necessary, in 3sessions Baseline:  Goal status: INITIAL  3.  Pt will demo use of cognitive (memory, planning/organization, attention, awareness) compensations when performing detailed language tasks in order to achieve 90% success, in 3 sessions Baseline:  Goal status: REVISED  4.  Pt  will demo compensations with POs for reported labial leakage due to facial numbness on lt (including upper/lower lips)  Baseline:  Goal Status: Initial   LONG TERM GOALS: Target date: 03/31/23  Pt will demo alternating attention with mod-max complex work-like tasks involving language (using interruptions) with compensations 100%, in 3 sessions Baseline:  Goal status: INITIAL  2.  Pt/family will report pt completes household tasks involving language for 60 minutes with compensations if necessary, in 4 sessions Baseline:  Goal status: INITIAL  3.  Pt will demo knowledge of need to employ compensations when performing detailed language tasks in order to achieve 100% success, in 4 sessions Baseline:  Goal status: INITIAL  4.  Pt will tell SLP 3 compensations he may need to use at work to improve accuracy with recall or attention Baseline:  Goal status: INITIAL  5.  Pt will score higher on cognitive PROM in the last 1-2 sessions than on initial administration  Baseline:  Goal Status: Initial   ASSESSMENT:  CLINICAL IMPRESSION: Patient is a 47 y.o. male who was seen today for completion of CLQT. Results above. See "today's treatment" for more details of today's session. 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 I don't think I can do that now." Pt appeared with maybe slightly flat affect today but motivated for change for cognitive linguistics. Pt's cognition as it stands currently would be detrimental to his job security and pt would benefit from skilled ST to improve these skills.   OBJECTIVE IMPAIRMENTS: include attention, memory, awareness, and executive functioning. These impairments are limiting patient from return to work, managing medications, managing appointments, managing finances, household responsibilities,  ADLs/IADLs, and effectively communicating at home and in community. Factors affecting potential to achieve goals and functional outcome are cooperation/participation level (according to wife, Shawn York). Patient will benefit from skilled SLP services to address above impairments and improve overall function.  REHAB POTENTIAL: Good  PLAN:  SLP FREQUENCY: 2x/week  SLP DURATION: 8 weeks (or 17 sessions)  PLANNED INTERVENTIONS: Language facilitation, Environmental controls, Cueing hierachy, Cognitive reorganization, Internal/external aids, Functional tasks, SLP instruction and feedback, Compensatory strategies, and Patient/family education  Delia Slatten, CCC-SLP 02/02/2023, 8:37 AM

## 2023-02-02 ENCOUNTER — Ambulatory Visit: Payer: BC Managed Care – PPO | Admitting: Neurology

## 2023-02-02 ENCOUNTER — Ambulatory Visit: Payer: BC Managed Care – PPO

## 2023-02-03 ENCOUNTER — Ambulatory Visit: Payer: BC Managed Care – PPO

## 2023-02-03 ENCOUNTER — Encounter: Payer: Self-pay | Admitting: Physician Assistant

## 2023-02-03 ENCOUNTER — Ambulatory Visit: Payer: BC Managed Care – PPO | Admitting: Occupational Therapy

## 2023-02-03 ENCOUNTER — Other Ambulatory Visit: Payer: Self-pay | Admitting: Physician Assistant

## 2023-02-03 DIAGNOSIS — R1311 Dysphagia, oral phase: Secondary | ICD-10-CM | POA: Diagnosis not present

## 2023-02-03 DIAGNOSIS — R41840 Attention and concentration deficit: Secondary | ICD-10-CM

## 2023-02-03 DIAGNOSIS — M6281 Muscle weakness (generalized): Secondary | ICD-10-CM

## 2023-02-03 DIAGNOSIS — I69354 Hemiplegia and hemiparesis following cerebral infarction affecting left non-dominant side: Secondary | ICD-10-CM

## 2023-02-03 DIAGNOSIS — R208 Other disturbances of skin sensation: Secondary | ICD-10-CM

## 2023-02-03 DIAGNOSIS — R41841 Cognitive communication deficit: Secondary | ICD-10-CM

## 2023-02-03 DIAGNOSIS — R278 Other lack of coordination: Secondary | ICD-10-CM | POA: Diagnosis not present

## 2023-02-03 DIAGNOSIS — R4701 Aphasia: Secondary | ICD-10-CM | POA: Diagnosis not present

## 2023-02-03 DIAGNOSIS — C712 Malignant neoplasm of temporal lobe: Secondary | ICD-10-CM

## 2023-02-03 NOTE — Therapy (Signed)
OUTPATIENT OCCUPATIONAL THERAPY NEURO TREATMENT NOTE  Patient Name: Shawn York MRN: [id number] DOB:09/16/1975, 47 y.o., male Today's Date: 02/03/2023  PCP: Etta Grandchild, MD REFERRING PROVIDER: Milinda Antis, PA-C  END OF SESSION:  OT End of Session - 02/03/23 0907     Visit Number 3    Number of Visits 17    Date for OT Re-Evaluation 03/24/23    Authorization Type [employer]    OT Start Time 0853   arrival time from Speech therapy session   OT Stop Time 0931    OT Time Calculation (min) 38 min    Activity Tolerance No increased pain;Patient tolerated treatment well;Patient limited by fatigue;Patient limited by pain    Behavior During Therapy [facility] for tasks assessed/performed              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   Depression    Epistaxis    Headache    Hypertension    PTSD (post-traumatic stress disorder)    Seizures (HCC)    Past Surgical History:  Procedure Laterality Date   BASAL CELL CARCINOMA EXCISION  1995   CRANIOTOMY FOR TUMOR Right 02/25/2011   IR ANGIO EXTERNAL CAROTID SEL EXT CAROTID BILAT MOD SED  11/16/2021   IR ANGIO INTRA EXTRACRAN SEL COM CAROTID INNOMINATE UNI R MOD SED  11/16/2021   IR ANGIO INTRA EXTRACRAN SEL INTERNAL CAROTID UNI L MOD SED  11/16/2021   IR ANGIO VERTEBRAL SEL VERTEBRAL UNI R MOD SED  11/16/2021   IR ANGIOGRAM FOLLOW UP STUDY  11/17/2021   IR NEURO EACH ADD'L AFTER BASIC UNI LEFT (MS)  11/17/2021   IR NEURO EACH ADD'L AFTER BASIC UNI RIGHT (MS)  11/17/2021   IR TRANSCATH/EMBOLIZ  11/16/2021   IR US GUIDE VASC ACCESS RIGHT  11/16/2021   RADIOLOGY WITH ANESTHESIA N/A 11/16/2021   Procedure: IR WITH ANESTHESIA;  Surgeon: Baldemar Lenis, MD;  [facility] OR;  Service: Radiology;  Laterality: N/A;   TOTAL HIP ARTHROPLASTY Right 12/21/2021   Procedure: RIGHT TOTAL HIP REPLACEMENT;  Surgeon: Tarry Kos, MD;  Location: [facility] OR;  Service: Orthopedics;  Laterality: Right;  3-C   WISDOM TOOTH EXTRACTION     Patient Active Problem List   Diagnosis Date Noted   Depression with anxiety 01/11/2023   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   Coagulation defect, unspecified (HCC) 11/02/2022   Amnestic MCI (mild cognitive impairment with memory loss) 11/02/2022   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) 11/02/2022   Abnormal finding on MRI of brain 05/30/2022   Aneurysm of ascending aorta without rupture (HCC) 02/28/2022   Anemia due to acquired thiamine deficiency 12/01/2021   Seasonal allergic rhinitis due to pollen 11/08/2021   Family history of dissecting aortic aneurysm 11/08/2021   Contrast media allergy 11/08/2021   Avascular necrosis of bone of right hip (HCC) 11/02/2021   Avascular necrosis of bone of left hip (HCC) 11/02/2021   Primary hypertension 05/17/2021   Encounter for general adult medical examination with abnormal findings 05/17/2021   Posttraumatic stress disorder 06/05/2018   Nightmares associated with chronic post-traumatic stress disorder 09/27/2017   Brain tumor, glioma (HCC) 09/27/2017   Severe episode of recurrent major depressive disorder, without psychotic features (HCC)    Major depressive disorder, recurrent episode (HCC) 08/17/2016   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  ONSET DATE: 12/29/22 DOS craniotomy and tumor resection   REFERRING DIAG: C71.2 (ICD-10-CM) - Anaplastic oligodendroglioma of temporal lobe (HCC) I63.112 (ICD-10-CM) - 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  THERAPY DIAG:  Hemiplegia and hemiparesis following cerebral infarction affecting left non-dominant side (HCC)  Other lack of coordination  Muscle weakness (generalized)  Attention and concentration deficit  Other disturbances of skin sensation  Rationale for Evaluation and Treatment:  Rehabilitation  PERTINENT HISTORY: 47 year old male with history of AVN of b-hips s/p R-THR, HTN, PTSD, depression w/anxiety, ascending aortic aneurysm, 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 Adline Peals with 70% removal of tumor. Metronomic Temodar initiated. He completed standard radiation with concurrent temozolomide and Avastin in 2013. MRI on 11/09/2022 suspicious for progression and repeat MRI done 4 weeks later found to have FLAIR lesion. He underwent repeat right temporal craniotomy for resection of tumor on 12/29/2022 by Dr. Lavonna Monarch at [facility]. This was complicated by bleeding from a branch of the MCA and post-op motor deficits of left side and left facial droop. Follow up MRI showed subjacent extra axial hemorrhage with layering blood products in right temporal lobe resection cavity with small volume SAH and trace cytotoxic edema in resection cavity.  "This (point to L) arm is a big challenge.  I will tell it to do something and it won't." Reports decreased sensation, limited FMC, limited ability to grade pressure.  Spouse reports that when attempting to complete bimanual tasks, LUE may start task with setup but will not complete task as RUE.  PRECAUTIONS: no lifting or strenuous exercise until cleared by MD  WEIGHT BEARING RESTRICTIONS: No   SUBJECTIVE:   SUBJECTIVE STATEMENT: Pt rpeorts that he feels like things are happening, but he still has to be intentional about using the L hand.   Pt accompanied by: self, wife dropped off and mother to pick up after sessions  PAIN:  Are you having pain? Yes: NPRS scale: 0/10 Pain location: Lt hand feels cold and mild nerve pain  FALLS: Has patient fallen in last 6 months? 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, break down pallets, Conservation officer, nature  PATIENT GOALS: to be able to play guitar again, not drop things   OBJECTIVE: (All objective assessments below are from initial evaluation on: 01/26/23 unless otherwise specified.)   HAND DOMINANCE: Right  ADLs: Transfers/ambulation related to ADLs: Mod I without AD Eating: would need assist to cut meat, knocks items off table with L hand (use of 2 hands to stabilize and scoop) Grooming: utilizing LUE as gross assist to hold items but not utilizing during task UB Dressing: increased effort LB Dressing: increased effort, attempting to utilize LUE however it will frequently drop things. Difficulty with tying shoes/draw string and buttoning  Toileting: increased time/effort when tearing toilet paper Bathing: difficulty with use of LUE when washing R side of body Tub Shower transfers: Mod I stepping over tub ledge Equipment: Transfer tub bench  IADLs: Light housekeeping: attempted to feed dogs and dropped bowl from L hand Meal Prep: not currently cooking Community mobility: not cleared to drive Medication management:  pt has a system and is able to remember to take as prescribed, difficulty with opening pill bottles  MOBILITY STATUS: Independent  POSTURE COMMENTS:  No Significant postural limitations  ACTIVITY TOLERANCE: Activity tolerance: WFL for tasks assessed  FUNCTIONAL OUTCOME MEASURES: Upper Extremity Functional Scale (UEFS): 18/80 or 22% functional (rated sole use of Lt arm)  UPPER EXTREMITY ROM:  RUE: WFL; LUE: Has active movement - can reach overhead and internal rotation with increased time/effort - difficulty sustaining, and increased compensations noted with fatigue   UPPER EXTREMITY MMT:   grossly 4+ to 5/5 bilaterally  HAND  FUNCTION: 02/01/23: Grip: Rt: 125#, Lt: 53#  COORDINATION: Box and Blocks:  Right 42 blocks, Left 12 blocks  SENSATION: 02/01/23: OT tries Semmes-Weinstein Testing and he cannot perceive 6.65 (largest) most of the time globally in Lt hand, and so had diminished or no protective sensation and was cautioned about that.   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 with confrontation testing  Patient has difficulty with following activities due to following visual impairments: bumping into walls and furniture on L side of body  PERCEPTION: Impaired: Inattention/neglect: does not attend to left side of body   TODAY'S TREATMENT:                                                  02/03/23 NMR: engaged in prone developmental postures with weightbearing and prone on elbows with weight shifts and scapular prone retraction.  He is even able to push up from WB through elbows and knees to onto hands and knees to complete alternating forward reach.  Pt with some L wrist instability with WB only through LUE, therefore OT providing min-mod support at L elbow and wrist to facilitate increased positioning. Engaged in weight shifting side to side and forward/backward both in WB on elbows and then weight bearing through hands. GMC: engaged in picking up and placing large jacks and matching to colored cones with focus on functional reach, grasp/release, progressing to pouring movement with focus on pronation and motor control.  OT providing cues to visually attend to  LUE during functional reach to compensate for impaired sensation.  Pt knocking over cones x2 due to decreased visual attention and motor control.  Pt demonstrating difficulty with release of items from hand during task, however observed pt dropping papers x2 when gathering items to leave session. UE ROM: OT facilitated increased shoulder and wrist ROM.  OT providing min tactile cues for quality of movement with scapular retraction.  OT providing demonstration and verbal cues for radial and median nerve glides with focus on shoulder and wrist movements.  OT educating on use of vision to attend to quality of movement as pt with decreased abduction and supination with median nerve glide.  OT instructed on prayer stretch for additional wrist extension stretch.  Pt familiar with scapular retraction and prayer stretches from hospital.     02/01/23: He starts with a discussion of his home tasks with the upper extremity  functional scale and he answers that he is largely focused on sole use of the left upper extremity.  We discuss inattention to the left side and the possible safety risks that this assumes.  Additionally OT attempted to check his sensation with the monofilaments and he is unable to perceive even the thickest in most parts of his left hand and arm.  OT discusses this lack of protective sensation that he could burn himself or cut himself without knowing and to use caution at home and stay away from sharp or hot objects.  He states understanding and he does have a small cut on his ulnar wrist today.  His grip strength was checked and he does have more than 50 pounds which is encouraging, but much less than his right hand.  OT discusses, in depth, about sensorimotor facilitation and cutaneous stimulation and the patient trials vibration use, brushing with a towel and both of these things are initially painful and then feel relieving over time.  He is recommended to do this multiple times a day to help  reeducate his nerves.  Additionally OT educates on weightbearing exercises "developmental postures" today for neuromuscular reeducation to be done with core training, cross body reaching, bilateral extremity use, ipsilateral extremity use supine on the plinth or in bed performing flexor withdrawal, rolling from supine to side-lying, weightbearing and prone on elbows with weight shifts and scapular prone retraction.  He is even able to push up onto hands and knees and do "half push-ups" today.  The entire time he was encouraged to be breathing as not to perform Valsalva maneuver or "bear down," as this is contraindicated now.  He was asymptomatic the whole time and has no issues with these, was educated to perform 2-3 times a day at home for 15 minutes at a time with breaks as needed.  OT also noted tightness through his arm but did not have much time to discuss stretches today.  He states understanding what was discussed so far.   01/26/23 Educated on Light house scanning and hand placement and/or reminders to scan L with table top tasks and reading. Educated on importance of looking up and forward instead of to floor during ambulation to attend to items to compensate for upper L quadrant (homonymous quadranopsia) inattention.  Completed clock drawing and line bisection with no L inattention.  Completed confrontation testing with impairment in L upper quadrant.  PATIENT EDUCATION: Education details: Educated on role and purpose of OT as well as potential interventions and goals for therapy based on initial evaluation findings. Person educated: Patient and Spouse Education method: Explanation, Demonstration, and Handouts Education comprehension: verbalized understanding and needs further education  HOME EXERCISE PROGRAM: Developmental Motor Sequence (Rood) Do each 2-3 x day, don't hold breath, slowly, 5-10x each Crunches with arms and legs (Flexor-withdrawl) Roll over (toward unaffected side)   "Superman" extension exercises (Pivot prone)       4. Push up/down and Rock side/side on elbows       5. Push up to get to hands and knees   Access Code: [email or web address] URL: [email or web address] Date: 02/03/2023 Prepared by: [facility] - Outpatient  Rehab - [facility]  Exercises - Seated Scapular Retraction  - 2-3 x daily - 10 reps - 10 sec hold - Standing Radial Nerve Glide  - 2-3 x daily - 10 reps - Standing Median Nerve Glide  - 2-3 x daily - 10 reps - Wrist Prayer Stretch  - 2-3 x daily - 10 reps -  10 sec hold   GOALS: Goals reviewed with patient? Yes  SHORT TERM GOALS: Target date: 02/24/23  Pt will be independent in HEP for LUE coordination and ROM. Baseline: Goal status: IN PROGRESS  2.  Pt will verbalize understanding of task modifications and/or potential AE needs to increase ease, safety, and independence w/ ADLs. Baseline:  Goal status: IN PROGRESS  3.  Patient will demonstrate improved awareness of visual management strategies for L inattention. Baseline:  Goal status: IN PROGRESS  4.  Pt will demonstrate improved UE functional use for ADLs as evidenced by increasing box/ blocks score by 4 blocks with LUE Baseline: Box and Blocks:  Right 42 blocks, Left 12 blocks Goal status: IN PROGRESS   LONG TERM GOALS: Target date: 03/24/23  Pt will demonstrate improved UE functional use for ADLs as evidenced by increasing box/ blocks score by 8 blocks with LUE Baseline: Box and Blocks:  Right 42 blocks, Left 12 blocks Goal status: IN PROGRESS  2.  Pt will demonstrate improved fine motor coordination for ADLs as evidenced by being able to complete 9 hole peg test with LUE in 2 min time limit. Baseline:  Goal status: IN PROGRESS  3.  Pt will demonstrate improved awareness of safety considerations and AE needs to ensure safety d/t decreased sensation Baseline:  Goal status: IN PROGRESS  4.  Pt will demonstrate improved attention to LUE to allow for  sustained grasp to transport household items 25' to increase engagement in IADLs. Baseline:  Goal status: IN PROGRESS  5.  Pt will demonstrate improved functional use of LUE as evidenced by improved score by TBD on UEFS. Baseline: TBD Goal status: IN PROGRESS   ASSESSMENT:  CLINICAL IMPRESSION: Pt demonstrating good mobility in/out of prone and quadruped positioning.  Pt does continue to report tightness in L wrist in WB.  Pt tolerating radial and median nerve glides with focus on visual attention to movements and focus on quality of movement.  Pt will benefit from continued focus on ROM, coordination, sensation issues, and overall tightness form shoulder to wrist and hand.   PLAN:  OT FREQUENCY: 2x/week  OT DURATION: 8 weeks  PLANNED INTERVENTIONS: self care/ADL training, therapeutic exercise, therapeutic activity, neuromuscular re-education, manual therapy, passive range of motion, functional mobility training, splinting, electrical stimulation, ultrasound, compression bandaging, moist heat, cryotherapy, patient/family education, cognitive remediation/compensation, visual/perceptual remediation/compensation, psychosocial skills training, energy conservation, coping strategies training, and DME and/or AE instructions  CONSULTED AND AGREED WITH PLAN OF CARE: Patient and family member/caregiver  PLAN FOR NEXT SESSION:  Review supine developmental postures on plinth for weightbearing and neuro re-ed.  Review concepts of desensitization and sensory facilitation. Attempt to provide comprehensive stretch program from shoulder to wrist and hand, also edu on nerve glides- especially for radial nerve.  Work on [employer] and [employer] as well  Khallid Pasillas, OTR/L 02/03/2023, 9:08 AM

## 2023-02-03 NOTE — Therapy (Signed)
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   Depression    Epistaxis    Headache    Hypertension    PTSD (post-traumatic stress disorder)    Seizures (HCC)    Past Surgical History:  Procedure Laterality Date   BASAL CELL CARCINOMA EXCISION  1995   CRANIOTOMY FOR TUMOR Right 02/25/2011   IR ANGIO EXTERNAL CAROTID SEL EXT CAROTID BILAT MOD SED  11/16/2021   IR ANGIO INTRA EXTRACRAN SEL COM CAROTID INNOMINATE UNI R MOD SED  11/16/2021   IR ANGIO INTRA EXTRACRAN SEL INTERNAL CAROTID UNI L MOD SED  11/16/2021   IR ANGIO VERTEBRAL SEL VERTEBRAL UNI R MOD SED  11/16/2021   IR ANGIOGRAM FOLLOW UP STUDY  11/17/2021   IR NEURO EACH ADD'L AFTER BASIC UNI LEFT (MS)  11/17/2021   IR NEURO EACH ADD'L AFTER BASIC UNI RIGHT (MS)  11/17/2021   IR TRANSCATH/EMBOLIZ  11/16/2021   IR US GUIDE VASC ACCESS RIGHT  11/16/2021   RADIOLOGY WITH ANESTHESIA N/A 11/16/2021   Procedure: IR WITH ANESTHESIA;  Surgeon: Shawn Lenis, MD;  Location: [facility] OR;  Service: Radiology;  Laterality: N/A;   TOTAL HIP ARTHROPLASTY Right 12/21/2021   Procedure: RIGHT TOTAL HIP REPLACEMENT;  Surgeon: Shawn Kos, MD;  Location: [facility] OR;  Service: Orthopedics;  Laterality: Right;  3-C   WISDOM TOOTH EXTRACTION     Patient Active Problem List   Diagnosis Date Noted    Depression with anxiety 01/11/2023   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   Coagulation defect, unspecified (HCC) 11/02/2022   Amnestic MCI (mild cognitive impairment with memory loss) 11/02/2022   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) 11/02/2022   Abnormal finding on MRI of brain 05/30/2022   Aneurysm of ascending aorta without rupture (HCC) 02/28/2022   Anemia due to acquired thiamine deficiency 12/01/2021   Seasonal allergic rhinitis due to pollen 11/08/2021   Family history of dissecting aortic aneurysm 11/08/2021   Contrast media allergy 11/08/2021   Avascular necrosis of bone of right hip (HCC) 11/02/2021   Avascular necrosis of bone of left hip (HCC) 11/02/2021   Primary hypertension 05/17/2021   Encounter for general adult medical examination with abnormal findings 05/17/2021   Posttraumatic stress disorder 06/05/2018   Nightmares associated with chronic post-traumatic stress disorder 09/27/2017   Brain tumor, glioma (HCC) 09/27/2017   Severe episode of recurrent major depressive disorder, without psychotic features (HCC)    Major depressive disorder, recurrent episode (HCC) 08/17/2016   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    ONSET DATE: 12/29/22  REFERRING DIAG: C71.2 (ICD-10-CM) - Anaplastic oligodendroglioma of temporal lobe (HCC) I63.112 (ICD-10-CM) - Cerebral infarction due to embolism  of left vertebral artery (HCC)   THERAPY DIAG:  Cognitive communication deficit  Dysphagia, oral phase  Rationale for Evaluation and Treatment: Rehabilitation  SUBJECTIVE:   SUBJECTIVE STATEMENT: "I have to wake up this (lt) arm." (Pt, re: takeaway from OT, earlier) Pt accompanied by: self  PERTINENT HISTORY:  Patient is a 47 year old male with history of AVN of b-hips s/p R-THR, HTN, PTSD, depression w/anxiety, ascending aortic aneurysm, anaplastic oligodendroglioma diagnosed in May of 2012 and underwent right temporal craniotomy  by Dr. Adline York with 70% removal of tumor. Metronomic Temodar initiated. He completed standard radiation with concurrent temozolomide and Avastin in 2013. MRI on 11/09/2022 suspicious for progression and repeat MRI done 4 weeks later found to have FLAIR lesion. He underwent repeat right temporal craniotomy for resection of tumor on 12/29/2022 by Dr. Lavonna York at [facility] This was complicated by bleeding from a branch of the MCA and post-op motor deficits of left side and left facial droop. Follow up MRI showed subjacent extra axial hemorrhage with layering blood products in right temporal lobe resection cavity with small volume SAH and trace cytotoxic edema in resection cavity. Therapy evaluations completed with recommendations for CIR. Patient admitted 01/06/23. SLP consult today due to reports of cognitive changes and biting is tongue when eating.   Pt's wife sent a message to pt's therapists via MyChart on 01/25/23 which stated: Hello Dr. Sundra York, this is Shawn York's wife Shawn York and I wanted to write to you prior to our upcoming visit tomorrow to inform you of the current situation. Shawn York is having a hard time following through with simple tasks and it's causing a lot of stress in the home. He does not want to hear how important it is for him to do his PT from me, but he is not doing the exercises like he should. His depression is very severe and is aiding in the current situation at home. I wanted you to know ahead of time so I don't have to bring this up in front of him at our appointment.   PAIN:  Are you having pain? No  FALLS: Has patient fallen in last 6 months?  No  PATIENT GOALS: Return to work  OBJECTIVE:   DIAGNOSTIC FINDINGS:  MR BRAIN WO CONTRAST December 12 2022 IMPRESSION: 1. Changes since 2020 MRI in the residual superior Right temporal gyrus highly suspicious for Tumor Recurrence, including new mass-like T2/FLAIR hyperintense gyral expansion and small  new multifocal nodular enhancement there. No intracranial mass effect 2. Also new since 2020 but chronic appearing lacunar infarcts in the right basal ganglia. 3. No other acute intracranial abnormality.   MR BRAIN WO CONTRAST  Jan 17 2023 IMPRESSION: 1. 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 Previously seen nodular foci of contrast enhancement in the right temporal lobe are no longer visualized. 2. New T2/FLAIR hyperintense signal abnormality in the right corona radiata with associated curvilinear contrast enhancement in these regions. This could potentially represent a region of subacute infarct. Recommend attention on follow-up. 3. Small volume subarachnoid hemorrhage and postoperative subdural hematoma along the right cerebral convexity, as above. 4. 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 5. Dural enhancement and thickening along the left occipital convexity and likely the left temporal convexity, which may be due to CSF hypotension.  Discharge from SLP service:Jan 17, 2023 Patient has  met 1 of 1 long term goals.  Patient to discharge at overall Modified Independent level.  Reasons goals not met: N/A  Clinical Impression/Discharge Summary: Patient has made functional gains and has met 1 of 1 LTGs this admission. Currently, patient requires extra processing time and is overall Mod I for recall of daily information with use of compensatory strategies. Patient continues to report impaired sensation of his oral-motor musculature resulting in him biting his lips and tongue intermittently. However, his strength appears intact without evidence of dysarthria and with 100% intelligibility. Patient and family education is complete and patient will discharge home  with assistance from family. Due to patient's high level of functioning and overall independence at baseline, f/u outpatient SLP services are recommended.  Care Partner:  Caregiver Able to Provide Assistance: Yes  Type of Caregiver Assistance: Cognitive;Physical Recommendation:  Outpatient SLP;24 hour supervision/assistance  Rationale for SLP Follow Up: Maximize cognitive function and independence;Reduce caregiver burden   COGNITION: Overall cognitive status: Impaired Areas of impairment:  Attention: Impaired: Alternating, Divided Awareness: Impaired: Emergent, Neglect, and Comment: Jurgen demonstrated decr'd awareness of errors (emergent awareness) especially when error was on lt side. Executive function: Impaired: Problem solving, Organization, Planning, Error awareness, Self-correction, and Slow processing Behavior: Within functional limits, despite wife's note to therapists; Pt smiled at SLP x2-3 during evaluation, and made one joking comment. Functional deficits: Pt noted he was having more difficulty paying attention after sx, and overall told SLP his thinking speed felt "slower", and that he feels he could not multi-task at work like he used to prior to [employer].    STANDARDIZED ASSESSMENTS: CLQT was completed. Result below: The Cognitive Linguistic Quick Test (CLQT) was administered to assess the relative status of five cognitive domains: attention, memory, language, executive functioning, and visuospatial skills. Scores from 10 tasks were used to estimate severity ratings (standardized for age groups 18-69 years and 70-89 years) for each domain, a clock drawing task, as well as an overall composite severity rating of cognition.      Task Score Criterion Cut Scores  Personal Facts 8/8 8  Symbol Cancellation 12/12 11  Confrontation Naming 10/10 10  Clock Drawing  12/13 12  Story Retelling 9/10 6  Symbol Trails 6/10 9  Generative Naming 4/9 5  Design Memory 4/6 5  Mazes  7/8 7  Design  Generation 8/13 6    Cognitive Domain Composite Score Severity Rating  Attention 188/215 WNL  Memory 184/185 High Mild  Executive Function 25/40 Low WNL  Language 31/37 WNL  Visuospatial Skills 81/105 High Mild  Clock Drawing  12/13 WNL  Composite Severity Rating 3.6 Low WNL           PATIENT REPORTED OUTCOME MEASURES (PROM): Cognitive Function: was given to pt 02/01/23 and asked to return next session   TODAY'S TREATMENT:  DATE:  02/03/23: Pt did not return cognitive function PROM. SLP provided pt with targeted attention and organization tasks. Pt worked with detailed instrucutions and mathematical problems and did not double check his answers. Two errors were made which were not noticed by pt. SLP stressed to pt he will need to double check his answers now more than ever before. Pt said he would do so on remaining homework problems. SLP also told pt to download MyChart app to track appointments.   02/01/23: SLP completed the CLQT and discussed results with pt. Organization, planning, memory, and attention appear to be pt's deficit areas for SLP to target. Results are above.  01/26/23: SLP discussed some compensations for mealtime as pt stated he is having difficulty feeling food on the lt labial margin during meals - setting a mirror up in front of him at meals was suggested.  Standardized eval results, as much as pt has completed, were also reviewed.  PATIENT EDUCATION: Education details: see "today's treatment" Person educated: Patient Education method: Explanation, Demonstration, and Verbal cues Education comprehension: verbalized understanding and needs further education   GOALS: Goals reviewed with patient? No  SHORT TERM GOALS: Target date: 03/03/23  Pt will demo alternating attention with min-mod complex tasks involving language with  compensations 90%, in 3 sessions Baseline: Goal status: INITIAL  2.  Pt/family will report pt completes household tasks involving language for 30 consecutive minutes with compensations if necessary, in 3sessions Baseline:  Goal status: INITIAL  3.  Pt will demo use of cognitive (memory, planning/organization, attention, awareness) compensations when performing detailed language tasks in order to achieve 90% success, in 3 sessions Baseline:  Goal status: REVISED  4.  Pt will demo compensations with POs for reported labial leakage due to facial numbness on lt (including upper/lower lips)  Baseline:  Goal Status: Initial   LONG TERM GOALS: Target date: 03/31/23  Pt will demo alternating attention with mod-max complex work-like tasks involving language (using interruptions) with compensations 100%, in 3 sessions Baseline:  Goal status: INITIAL  2.  Pt/family will report pt completes household tasks involving language for 60 minutes with compensations if necessary, in 4 sessions Baseline:  Goal status: INITIAL  3.  Pt will demo knowledge of need to employ compensations when performing detailed language tasks in order to achieve 100% success, in 4 sessions Baseline:  Goal status: INITIAL  4.  Pt will tell SLP 3 compensations he may need to use at work to improve accuracy with recall or attention Baseline:  Goal status: INITIAL  5.  Pt will score higher on cognitive PROM in the last 1-2 sessions than on initial administration  Baseline:  Goal Status: Initial   ASSESSMENT:  CLINICAL IMPRESSION: Patient is a 47 y.o. male who was seen today therapy targeting planning, organization, and attention. See "today's treatment" for more details of today's session. 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 I don't think I  can do that now." Pt appeared with maybe slightly flat affect today but motivated for change for cognitive linguistics. Pt's cognition as it stands currently would be detrimental to his job security and pt would benefit from skilled ST to improve these skills.   OBJECTIVE IMPAIRMENTS: include attention, memory, awareness, and executive functioning. These impairments are limiting patient from return to work, managing medications, managing appointments, managing finances, household responsibilities, ADLs/IADLs, and  effectively communicating at home and in community. Factors affecting potential to achieve goals and functional outcome are cooperation/participation level (according to wife, Shawn York). Patient will benefit from skilled SLP services to address above impairments and improve overall function.  REHAB POTENTIAL: Good  PLAN:  SLP FREQUENCY: 2x/week  SLP DURATION: 8 weeks (or 17 sessions)  PLANNED INTERVENTIONS: Language facilitation, Environmental controls, Cueing hierachy, Cognitive reorganization, Internal/external aids, Functional tasks, SLP instruction and feedback, Compensatory strategies, and Patient/family education    Sanford [place], CCC-SLP 02/02/2023, 8:37 AM

## 2023-02-06 ENCOUNTER — Ambulatory Visit (HOSPITAL_COMMUNITY): Payer: BC Managed Care – PPO | Attending: Internal Medicine

## 2023-02-06 ENCOUNTER — Other Ambulatory Visit: Payer: Self-pay | Admitting: Surgery

## 2023-02-06 DIAGNOSIS — I7121 Aneurysm of the ascending aorta, without rupture: Secondary | ICD-10-CM | POA: Insufficient documentation

## 2023-02-06 LAB — ECHOCARDIOGRAM COMPLETE
Area-P 1/2: 3.08 cm2
S' Lateral: 2.4 cm

## 2023-02-06 NOTE — Therapy (Signed)
OUTPATIENT OCCUPATIONAL THERAPY NEURO TREATMENT NOTE  Patient Name: Shawn York MRN: [id number] DOB:Aug 16, 1976, 47 y.o., male Today's Date: 02/08/2023  PCP: Etta Grandchild, MD REFERRING PROVIDER: Milinda Antis, PA-C  END OF SESSION:  OT End of Session - 02/08/23 0933     Visit Number 4    Number of Visits 17    Date for OT Re-Evaluation 03/24/23    Authorization Type BCBS    OT Start Time 0933    OT Stop Time 1018    OT Time Calculation (min) 45 min    Activity Tolerance No increased pain;Patient tolerated treatment well;Patient limited by fatigue;Patient limited by pain    Behavior During Therapy [facility] for tasks assessed/performed               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   Depression    Epistaxis    Headache    Hypertension    PTSD (post-traumatic stress disorder)    Seizures (HCC)    Past Surgical History:  Procedure Laterality Date   BASAL CELL CARCINOMA EXCISION  1995   CRANIOTOMY FOR TUMOR Right 02/25/2011   IR ANGIO EXTERNAL CAROTID SEL EXT CAROTID BILAT MOD SED  11/16/2021   IR ANGIO INTRA EXTRACRAN SEL COM CAROTID INNOMINATE UNI R MOD SED  11/16/2021   IR ANGIO INTRA EXTRACRAN SEL INTERNAL CAROTID UNI L MOD SED  11/16/2021   IR ANGIO VERTEBRAL SEL VERTEBRAL UNI R MOD SED  11/16/2021   IR ANGIOGRAM FOLLOW UP STUDY  11/17/2021   IR NEURO EACH ADD'L AFTER BASIC UNI LEFT (MS)  11/17/2021   IR NEURO EACH ADD'L AFTER BASIC UNI RIGHT (MS)  11/17/2021   IR TRANSCATH/EMBOLIZ  11/16/2021   IR US GUIDE VASC ACCESS RIGHT  11/16/2021   RADIOLOGY WITH ANESTHESIA N/A 11/16/2021   Procedure: IR WITH ANESTHESIA;  Surgeon: Baldemar Lenis, MD;  Location: [facility] OR;  Service: Radiology;  Laterality: N/A;   TOTAL HIP ARTHROPLASTY Right 12/21/2021   Procedure: RIGHT TOTAL HIP REPLACEMENT;  Surgeon: Tarry Kos, MD;  Location: [facility] OR;   Service: Orthopedics;  Laterality: Right;  3-C   WISDOM TOOTH EXTRACTION     Patient Active Problem List   Diagnosis Date Noted   Depression with anxiety 01/11/2023   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   Coagulation defect, unspecified (HCC) 11/02/2022   Amnestic MCI (mild cognitive impairment with memory loss) 11/02/2022   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) 11/02/2022   Abnormal finding on MRI of brain 05/30/2022   Aneurysm of ascending aorta without rupture (HCC) 02/28/2022   Anemia due to acquired thiamine deficiency 12/01/2021   Seasonal allergic rhinitis due to pollen 11/08/2021   Family history of dissecting aortic aneurysm 11/08/2021   Contrast media allergy 11/08/2021   Avascular necrosis of bone of right hip (HCC) 11/02/2021   Avascular necrosis of bone of left hip (HCC) 11/02/2021   Primary hypertension 05/17/2021   Encounter for general adult medical examination with abnormal findings 05/17/2021   Posttraumatic stress disorder 06/05/2018   Nightmares associated with chronic post-traumatic stress disorder 09/27/2017   Brain tumor, glioma (HCC) 09/27/2017   Severe episode of recurrent major depressive disorder, without psychotic features (HCC)    Major depressive disorder, recurrent episode (HCC) 08/17/2016   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    ONSET DATE: 12/29/22  DOS craniotomy and tumor resection   REFERRING DIAG: C71.2 (ICD-10-CM) - Anaplastic oligodendroglioma of temporal lobe (HCC) I63.112 (ICD-10-CM) - 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  THERAPY DIAG:  Hemiplegia and hemiparesis following cerebral infarction affecting left non-dominant side (HCC)  Other lack of coordination  Muscle weakness (generalized)  Attention and concentration deficit  Other disturbances of skin sensation  Rationale for Evaluation and Treatment: Rehabilitation  PERTINENT HISTORY: 47 year old male  with history of AVN of b-hips s/p R-THR, HTN, PTSD, depression w/anxiety, ascending aortic aneurysm, 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 Adline Peals with 70% removal of tumor. Metronomic Temodar initiated. He completed standard radiation with concurrent temozolomide and Avastin in 2013. MRI on 11/09/2022 suspicious for progression and repeat MRI done 4 weeks later found to have FLAIR lesion. He underwent repeat right temporal craniotomy for resection of tumor on 12/29/2022 by Dr. Lavonna Monarch at [facility]. This was complicated by bleeding from a branch of the MCA and post-op motor deficits of left side and left facial droop. Follow up MRI showed subjacent extra axial hemorrhage with layering blood products in right temporal lobe resection cavity with small volume SAH and trace cytotoxic edema in resection cavity.  "This (point to L) arm is a big challenge.  I will tell it to do something and it won't." Reports decreased sensation, limited FMC, limited ability to grade pressure.  Spouse reports that when attempting to complete bimanual tasks, LUE may start task with setup but will not complete task as RUE.  PRECAUTIONS: no lifting or strenuous exercise until cleared by MD  WEIGHT BEARING RESTRICTIONS: No   SUBJECTIVE:   SUBJECTIVE STATEMENT: Pt reports the other night after watching a movie at his in-laws, his hand felt more flaccidly weak.  He does state that it was later at night.  OT tells him that it can be normal to have decreased ability early in the morning or late at night when his attention make it worse.  OT does advise to continue to monitor himself because a major change in function could mean a new or evolving stroke pattern.  He does not present like that today however.   Pt accompanied by: self, mother  PAIN:  Are you having pain? Yes: NPRS scale: 0/10 Pain location: Lt hand feels cold and mild nerve  pain  FALLS: Has patient fallen in last 6 months? No  LIVING ENVIRONMENT: Lives with: lives with their family (lives with spouse, 28 yo son and 73 yo daughter) Lives in: House/apartment Stairs: Yes: External: 3 steps; FIL can built hand rails Has following equipment at home: Single point cane, Environmental consultant - 2 wheeled, Marine scientist  PLOF: Independent, Independent with basic ADLs, and Vocation/Vocational requirements: [employer]  - unload trucks, break down pallets, Conservation officer, nature  PATIENT GOALS: to be able to play guitar again, not drop things   OBJECTIVE: (All objective assessments below are from initial evaluation on: 01/26/23 unless otherwise specified.)   HAND DOMINANCE: Right  ADLs: Transfers/ambulation related to ADLs: Mod I without AD Eating: would need assist to cut meat, knocks items off table with L hand (use of 2 hands to stabilize and scoop) Grooming: utilizing LUE as gross assist to hold items but not utilizing during task UB Dressing: increased effort LB Dressing: increased effort, attempting to utilize LUE however it will frequently drop things. Difficulty with tying shoes/draw string and buttoning  Toileting: increased time/effort when tearing  toilet paper Bathing: difficulty with use of LUE when washing R side of body Tub Shower transfers: Mod I stepping over tub ledge Equipment: Transfer tub bench  IADLs: Light housekeeping: attempted to feed dogs and dropped bowl from L hand Meal Prep: not currently cooking Community mobility: not cleared to drive Medication management: pt has a system and is able to remember to take as prescribed, difficulty with opening pill bottles  MOBILITY STATUS: Independent  POSTURE COMMENTS:  No Significant postural limitations  ACTIVITY TOLERANCE: Activity tolerance: WFL for tasks assessed  FUNCTIONAL OUTCOME MEASURES: Upper Extremity Functional Scale (UEFS): 18/80 or 22% functional (rated sole use of Lt arm)  UPPER EXTREMITY ROM:   RUE: WFL; LUE: Has active movement - can reach overhead and internal rotation with increased time/effort - difficulty sustaining, and increased compensations noted with fatigue   UPPER EXTREMITY MMT:   grossly 4+ to 5/5 bilaterally  HAND FUNCTION: 02/01/23: Grip: Rt: 125#, Lt: 53#  COORDINATION: Box and Blocks:  Right 42 blocks, Left 12 blocks  SENSATION: 02/01/23: OT tries Semmes-Weinstein Testing and he cannot perceive 6.65 (largest) most of the time globally in Lt hand, and so had diminished or no protective sensation and was cautioned about that.   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  He is shown some compensation for shoulder tightness during these.  He states feeling a good tension through his arms but no pain.  He has some tightness through his wrist and forearm so he is reeducated on prayer stretches as well as doing some forearm supination stretches.  Next we move into strengthening both wrist and forearm using "hammer" exercises for the forearm and functional activities for the hand and wrist focused on proprioception, awareness by  carefully grasping small objects without knocking them over turning them with his wrist and forearm to drop them into a container.  We also work on pressure through his fingertips by "popping" a tubular tool across the table to each other again working on proprioception.  We briefly review developmental postures on the plinth and for continued shoulder and radial nerve pathway tightness, he is educated on shoulder internal and external rotation stretches to be done either seated at the table or in side-lying.  He states understanding all of these, has some extra little tingling and sensation through his arm afterwards which is normal after a "workout."  He was given handwritten sheet of recommendations and reminders.   02/03/23 NMR: engaged in prone developmental postures with weightbearing and prone on elbows with weight  shifts and scapular prone retraction.  He is even able to push up from WB through elbows and knees to onto hands and knees to complete alternating forward reach.  Pt with some L wrist instability with WB only through LUE, therefore OT providing min-mod support at L elbow and wrist to facilitate increased positioning. Engaged in weight shifting side to side and forward/backward both in WB on elbows and then weight bearing through hands. GMC: engaged in picking up and placing large jacks and matching to colored cones with focus on functional reach, grasp/release, progressing to pouring movement with focus on pronation and motor control.  OT providing cues to visually attend to LUE during functional reach to compensate for impaired sensation.  Pt knocking over cones x2 due to decreased visual attention and motor control.  Pt demonstrating difficulty with release of items from hand during task, however observed pt dropping papers x2 when gathering items to leave session. UE ROM: OT facilitated increased shoulder and wrist ROM.  OT providing min tactile cues for quality of movement with scapular  retraction.  OT providing demonstration and verbal cues for radial and median nerve glides with focus on shoulder and wrist movements.  OT educating on use of vision to attend to quality of movement as pt with decreased abduction and supination with median nerve glide.  OT instructed on prayer stretch for additional wrist extension stretch.  Pt familiar with scapular retraction and prayer stretches from hospital.     PATIENT EDUCATION: Education details: Educated on role and purpose of OT as well as potential interventions and goals for therapy based on initial evaluation findings. Person educated: Patient and Spouse Education method: Explanation, Demonstration, and Handouts Education comprehension: verbalized understanding and needs further education  HOME EXERCISE PROGRAM: Developmental Motor Sequence (Rood) Do each 2-3 x day, don't hold breath, slowly, 5-10x each Crunches with arms and legs (Flexor-withdrawl) Roll over (toward unaffected side)  "Superman" extension exercises (Pivot prone)       4. Push up/down and Rock side/side on elbows       5. Push up to get to hands and knees   Access Code: [email or web address] URL: [email or web address] Date: 02/03/2023 Prepared by: [facility]  Exercises - Seated Scapular Retraction  - 2-3 x daily - 10 reps - 10 sec hold - Standing Radial Nerve Glide  - 2-3 x daily - 10 reps - Standing Median Nerve Glide  - 2-3 x daily - 10 reps - Wrist Prayer Stretch  - 2-3 x daily - 10 reps - 10 sec hold   GOALS: Goals reviewed with patient? Yes  SHORT TERM GOALS: Target date: 02/24/23  Pt will be independent in HEP for LUE coordination and ROM. Baseline: Goal status: IN PROGRESS  2.  Pt will verbalize understanding of task modifications and/or potential AE needs to increase ease, safety, and independence w/ ADLs. Baseline:  Goal status: IN PROGRESS  3.  Patient will demonstrate improved awareness of visual  management strategies for L inattention. Baseline:  Goal status: IN PROGRESS  4.  Pt will demonstrate improved UE functional use for ADLs as evidenced by increasing box/ blocks score by 4 blocks with LUE Baseline: Box and Blocks:  Right 42 blocks, Left 12 blocks Goal status: IN PROGRESS   LONG TERM GOALS: Target date: 03/24/23  Pt will demonstrate improved UE functional use for ADLs as evidenced by increasing box/ blocks score by 8 blocks with LUE Baseline: Box and Blocks:  Right  42 blocks, Left 12 blocks Goal status: IN PROGRESS  2.  Pt will demonstrate improved fine motor coordination for ADLs as evidenced by being able to complete 9 hole peg test with LUE in 2 min time limit. Baseline:  Goal status: IN PROGRESS  3.  Pt will demonstrate improved awareness of safety considerations and AE needs to ensure safety d/t decreased sensation Baseline:  Goal status: IN PROGRESS  4.  Pt will demonstrate improved attention to LUE to allow for sustained grasp to transport household items 25' to increase engagement in IADLs. Baseline:  Goal status: IN PROGRESS  5.  Pt will demonstrate improved functional use of LUE as evidenced by improved score by TBD on UEFS. Baseline: TBD Goal status: IN PROGRESS   ASSESSMENT:  CLINICAL IMPRESSION: 02/08/23: Added of forearm supination and shoulder rotation stretches today, he is tolerating all well having some fluctuations in sensation but no large changes in numbness in fingertips yet.  Still some apparent attention deficits to the left side and he was recommended to not do any driving for his safety as he asks about that today.  He should follow-up with his physician about these things.  02/03/23: Pt demonstrating good mobility in/out of prone and quadruped positioning.  Pt does continue to report tightness in L wrist in WB.  Pt tolerating radial and median nerve glides with focus on visual attention to movements and focus on quality of movement.  Pt will  benefit from continued focus on ROM, coordination, sensation issues, and overall tightness form shoulder to wrist and hand.   PLAN:  OT FREQUENCY: 2x/week  OT DURATION: 8 weeks  PLANNED INTERVENTIONS: self care/ADL training, therapeutic exercise, therapeutic activity, neuromuscular re-education, manual therapy, passive range of motion, functional mobility training, splinting, electrical stimulation, ultrasound, compression bandaging, moist heat, cryotherapy, patient/family education, cognitive remediation/compensation, visual/perceptual remediation/compensation, psychosocial skills training, energy conservation, coping strategies training, and DME and/or AE instructions  CONSULTED AND AGREED WITH PLAN OF CARE: Patient and family member/caregiver  PLAN FOR NEXT SESSION:  Feel free to check shoulder stretches and internal and external rotation as well as forearm supination stretches which are all new.  Ask how his neurology follow-up went, continue on with functional activities and managing tightness and nerve issues.   Fannie Knee, OTR/L 02/08/2023, 11:30 AM

## 2023-02-08 ENCOUNTER — Encounter: Payer: Self-pay | Admitting: Rehabilitative and Restorative Service Providers"

## 2023-02-08 ENCOUNTER — Ambulatory Visit: Payer: BC Managed Care – PPO | Admitting: Rehabilitative and Restorative Service Providers"

## 2023-02-08 ENCOUNTER — Encounter: Payer: Self-pay | Admitting: Neurology

## 2023-02-08 ENCOUNTER — Ambulatory Visit: Payer: BC Managed Care – PPO

## 2023-02-08 ENCOUNTER — Ambulatory Visit (INDEPENDENT_AMBULATORY_CARE_PROVIDER_SITE_OTHER): Payer: BC Managed Care – PPO | Admitting: Neurology

## 2023-02-08 VITALS — BP 108/75 | HR 59 | Ht 74.0 in | Wt 217.0 lb

## 2023-02-08 DIAGNOSIS — R1311 Dysphagia, oral phase: Secondary | ICD-10-CM | POA: Diagnosis not present

## 2023-02-08 DIAGNOSIS — C712 Malignant neoplasm of temporal lobe: Secondary | ICD-10-CM | POA: Diagnosis not present

## 2023-02-08 DIAGNOSIS — I69354 Hemiplegia and hemiparesis following cerebral infarction affecting left non-dominant side: Secondary | ICD-10-CM

## 2023-02-08 DIAGNOSIS — F4310 Post-traumatic stress disorder, unspecified: Secondary | ICD-10-CM | POA: Diagnosis not present

## 2023-02-08 DIAGNOSIS — R41840 Attention and concentration deficit: Secondary | ICD-10-CM

## 2023-02-08 DIAGNOSIS — I69014 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: Secondary | ICD-10-CM | POA: Insufficient documentation

## 2023-02-08 DIAGNOSIS — R9089 Other abnormal findings on diagnostic imaging of central nervous system: Secondary | ICD-10-CM

## 2023-02-08 DIAGNOSIS — R41841 Cognitive communication deficit: Secondary | ICD-10-CM | POA: Diagnosis not present

## 2023-02-08 DIAGNOSIS — M6281 Muscle weakness (generalized): Secondary | ICD-10-CM | POA: Diagnosis not present

## 2023-02-08 DIAGNOSIS — R208 Other disturbances of skin sensation: Secondary | ICD-10-CM

## 2023-02-08 DIAGNOSIS — R278 Other lack of coordination: Secondary | ICD-10-CM | POA: Diagnosis not present

## 2023-02-08 DIAGNOSIS — R4189 Other symptoms and signs involving cognitive functions and awareness: Secondary | ICD-10-CM | POA: Insufficient documentation

## 2023-02-08 DIAGNOSIS — R4701 Aphasia: Secondary | ICD-10-CM | POA: Diagnosis not present

## 2023-02-08 MED ORDER — BUSPIRONE HCL 30 MG PO TABS
30.0000 mg | ORAL_TABLET | Freq: Two times a day (BID) | ORAL | 1 refills | Status: DC
Start: 2023-02-08 — End: 2023-06-23

## 2023-02-08 MED ORDER — OXCARBAZEPINE 300 MG PO TABS: 300.0000 mg | ORAL_TABLET | Freq: Two times a day (BID) | ORAL | 0 refills | Status: AC

## 2023-02-08 NOTE — Patient Instructions (Signed)
Subarachnoid Hemorrhage  Subarachnoid hemorrhage is bleeding in the area between the brain and the membrane that covers the brain (subarachnoid space). This bleeding increases the pressure on the brain and decreases blood flow to certain areas of the brain. Subarachnoid hemorrhage is a medical emergency. If this condition is not treated, it may cause permanent brain damage, stroke, or even death. What are the causes? This condition may be caused by: A weakened blood vessel in the brain that bursts (ruptured brain aneurysm). A head injury (trauma). Bleeding from blood vessels that have developed abnormally (arteriovenous malformation). A bleeding disorder. Use of blood thinners (anticoagulants). Use of certain drugs, such as cocaine. In some cases, the cause is not known. What increases the risk? You are more likely to develop this condition if: You smoke. You have high blood pressure (hypertension). You drink too much alcohol. You have a family history of brain aneurysm. You are older than 47 years of age. You are male, especially if you have gone through menopause. You have a certain genetic syndrome that results in kidney disease (autosomal dominant polycystic kidney disease) or connective tissue disease. You use cocaine. What are the signs or symptoms? Symptoms of this condition include: A sudden, severe headache. The headache is often described as the worst headache ever experienced. Nausea or vomiting, especially when combined with other symptoms such as a headache. Changes in your mental status such as: Loss of consciousness. Sudden confusion. Drowsiness. Stiff neck. Changes in your vision such as: Sensitivity to light. Double vision. Sudden trouble seeing out of one or both eyes. Sudden weakness or numbness of the face, arm, or leg, especially on one side of the body. Trouble speaking (expressive aphasia) or understanding speech (receptive aphasia). Difficulty  swallowing. Dizziness and loss of balance or coordination. How is this diagnosed? This condition is diagnosed based on your symptoms, a physical exam, and tests, such as: CT scan. MRI. Cerebral angiogram. This is a procedure to examine the blood vessels in the brain and neck. Lumbar puncture. This is a procedure to remove and examine a small amount of the fluid that surrounds the brain and spinal cord. Blood tests. Transcranial Doppler. This procedure uses ultrasound to monitor blood flow in the brain. How is this treated? This condition often requires treatment right away in the hospital to lower the risk of brain damage. Treatment depends on the cause, severity, and location of the bleeding and any damage that it has caused. Treatment goals are to stop bleeding, repair the cause of bleeding, relieve symptoms, and prevent problems. Treatment may include: Medicines that: Reverse the effects of anticoagulants, if you were taking anticoagulants before the subarachnoid hemorrhage. Lower blood pressure (antihypertensives). Relieve pain (analgesics). Relieve nausea or vomiting. Prevent seizures. Surgery to stop bleeding, repair the cause of the bleeding, or remove blood that has collected. This may involve: A procedure that is done from inside the blood vessel, in which the aneurysm is filled with small, platinum coils (endovascular coiling). Opening the skull (craniotomy) to reach the aneurysm and put a clip at the base of the aneurysm (surgical clipping). Relieving pressure on the brain by placing a tube (external ventricular drain, EVD) in the brain to drain blood. Physical, occupational, or speech-language therapy to improve any mental (cognitive) and day-to-day functions that are affected by your condition. In some cases, other actions may be taken to prevent short-term and long-term problems, including lung infection (pneumonia) and blood clots in your legs. Follow these instructions at  home: Medicines Take  over-the-counter and prescription medicines only as told by your health care provider. Ask your health care provider if the medicine prescribed to you requires you to avoid driving or using machinery. Do not take any medicines that contain aspirin or NSAIDs, such as ibuprofen, unless your health care provider approves. Lifestyle Rest and limit activities as directed. Rest helps the brain to heal. Make sure you: Get plenty of sleep. Avoid activities that cause physical or mental stress. Do not use any products that contain nicotine or tobacco. These products include cigarettes, chewing tobacco, and vaping devices, such as e-cigarettes. If you need help quitting, ask your health care provider. General instructions Do physical, occupational, and speech-language therapy as recommended. Follow instructions from your health care provider about whether eating and drinking are safe for you. You may need tests to make sure that you can swallow safely (swallow studies). Check and write down your blood pressure as told by your health care provider. Do not drive until your health care provider says that it is safe. Keep all follow-up visits to see how you are progressing. Where to find more information American Stroke Association: stroke.org Contact a health care provider if: You have a stiff neck. You have a cough. You have a fever. Get help right away if: You have any symptoms of a stroke. "BE FAST" is an easy way to remember the main warning signs of a stroke: B - Balance. Signs are dizziness, sudden trouble walking, or loss of balance. E - Eyes. Signs are trouble seeing or a sudden change in vision. F - Face. Signs are sudden weakness or numbness of the face, or the face or eyelid drooping on one side. A - Arms. Signs are weakness or numbness in an arm. This happens suddenly and usually on one side of the body. S - Speech. Signs are sudden trouble speaking, slurred speech, or  trouble understanding what people say. T - Time. Time to call emergency services. Write down what time symptoms started. You have other signs of a stroke, such as: A sudden, severe headache with no known cause. Nausea or vomiting. Seizure. These symptoms may be an emergency. Get help right away. Call 911. Do not wait to see if the symptoms will go away. Do not drive yourself to the hospital. Summary Subarachnoid hemorrhage is bleeding in the area between the brain and the membrane that covers the brain (subarachnoid space). Subarachnoid hemorrhage is a medical emergency. Treatment may include procedures to stop bleeding or reduce pressure in the skull, and medicines to prevent complications. Follow instructions from your health care provider about medicines, activity, and eating and drinking. This information is not intended to replace advice given to you by your health care provider. Make sure you discuss any questions you have with your health care provider. Document Revised: 12/14/2021 Document Reviewed: 12/14/2021 Elsevier Patient Education  2024 [employer].

## 2023-02-08 NOTE — Progress Notes (Signed)
Provider:  Melvyn Novas, MD  Primary Care Physician:  Etta Grandchild, MD [address] [place] [address]         SLEEP MEDICINE CLINIC     Provider:  Melvyn Novas, MD  Primary Care Physician:  Etta Grandchild, MD [address] [place] [address]      Referring Provider: This patient is referred via his Neuro oncologist at [facility]. I am not his stroke or oncology specialist and have seen Shawn York for sleep in the past. The recent development have made it  necessary to have a local generalist to follow, coordination of PT, OT , ST has been arranged by his [facility] neurologists. He already is scheduled for June 29th for another MRI and will meet with oncology at [facility].    HISTORY OF PRESENT ILLNESS:  Shawn York is a 47 y.o. male patient who is here for revisit 02/08/2023 for a post operative visit, the removal of recurrent brain tumor on 12-29-2022  has left him with left upper extremity weakness, less affected is the left leg and the left face is numb , but only mildy droopy.   SAH and 3 mm midline shift. There is 3 mm of leftward midline shift. Subarachnoid  hemorrhage within the right frontal lobe.   FINDINGS: Brain Parenchyma: Postoperative changes right pterional craniotomy with subjacent resection cavity. Expected postoperative pneumocephalus. Within the resection cavity there is small volume intraparenchymal hemorrhage, pneumocephalus. There is 3 mm of leftward midline shift. Subarachnoid hemorrhage within the right frontal lobe. Ventricles and Sulci: Normal for age.   Extra-Axial Spaces: Subdural hemorrhage overlying the right convexity measuring up to 8 mm (10:153).  Basal Cisterns: Normal.  Paranasal Sinuses: Normal. Mastoid air cells: Normal.  Orbits: Normal. Cranium and Bones: Status post right frontal craniotomy. Soft Tissues: Expected postsurgical changes with overlying skin staples and subcutaneous emphysema.   IMPRESSION: 1.   Expected postsurgical appearance status post right pterional craniotomy with subjacent resection cavity containing small volume and parenchymal hemorrhage. There is an associated subarachnoid hemorrhage within the right frontal lobe and 8 mm right sided subdural hemorrhage with 3 mm leftward midline shift. 2.  No new hypodensity concerning for territorial infarct.  Findings were discussed with Shawn York by [name], MD at 12/29/2022 7:03 PM.    Electronically Reviewed by:  Lyanne Co, MD, [facility] Electronically Reviewed on:  12/29/2022 7:04 PM  I have reviewed the images and concur with the above findings.  Electronically Signed by:  Charlotte Sanes, MD, [facility] Electronically Signed on:  12/29/2022 7:07 PM Procedure Note  Charlotte Sanes, MD - 12/29/2022 Formatting of this note might be different from the original. 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  FINDINGS: Brain Parenchyma: Postoperative changes right pterional craniotomy with subjacent resection cavity. Expected postoperative pneumocephalus. Within the resection cavity there is small volume intraparenchymal hemorrhage, pneumocephalus. There is 3 mm of leftward midline shift. Subarachnoid hemorrhage within the right frontal lobe. Ventricles and Sulci: Normal for age.   Extra-Axial Spaces: Subdural hemorrhage overlying the right convexity measuring up to 8 mm (10:153).  Basal Cisterns: Normal.  Paranasal Sinuses: Normal. Mastoid air cells: Normal.  Orbits: Normal. Cranium and Bones: Status post right frontal craniotomy. Soft Tissues: Expected postsurgical changes with  overlying skin staples and subcutaneous emphysema.   IMPRESSION: 1.  Expected postsurgical  appearance status post right pterional craniotomy with subjacent resection cavity containing small volume and parenchymal hemorrhage. There is an associated subarachnoid hemorrhage within the right frontal lobe and 8 mm right sided subdural hemorrhage with 3 mm leftward midline shift. 2.  No new hypodensity concerning for territorial infarct.  Findings were discussed with Shawn York by [name], MD at 12/29/2022 7:03 PM.    Electronically Reviewed by:  Lyanne Co, MD, [facility] Chief concern according to patient :      Expand All Collapse All     HISTORY OF PRESENT ILLNESS:  Shawn York is a 47 y.o. male patient who is seen upon referral on 11/02/2022 from his PCP to follow up on an abnormal MRI obtained through [facility].  The neuro-oncologist referred for a stroke evaluation.  The patient is not an active patient -  not seen at [facility] in 5 years. I was asked to see him once for sleep walking.  Visit is based on changes in his03/06/24  brain MRIs which are obtained and following up on his brain tumor history.  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  The patient states that he has not noticed any physical disability nothing that would indicate a stroke or a expanse of the previously diagnosed tumor.  There is no weakness coordination difficulty no sensory abnormality that is new but he does feel cognitively impaired.  In his own words he describes this as feeling foggy and sometimes losing track of time.  A Interval history: he had 3 procedures done in 2022, Hip surgery, ant cervical fusion, 02-2022 [facility], embolization for epistaxis. Here is his angio cather therization: FINDINGS: Right radial artery ultrasound and right radial artery angiogram: The caliber of the distal right radial artery is appropriate for angiogram access. The right  radial artery and the right ulnar artery have normal course and caliber. No significant anatomical variants noted.   Left CCA angiograms: Cervical angiograms show normal course and caliber of the visualized left common carotid and internal carotid arteries. There are no significant stenoses.   Left ICA angiograms: There is brisk vascular contrast filling of the left ACA and MCA vascular trees. Luminal caliber is smooth and tapering. No aneurysms or abnormally high-flow, early draining veins are seen. No regions of abnormal hypervascularity are noted. The visualized dural sinuses are patent. Small contribution of the ethmoidal branches of the ophthalmic artery to the nasal cavity is seen.   Left ECA angiograms: Prominent arterial blush of the nasal septum, inferior and lateral walls of the left nasal cavity are seen from sphenopalatine artery. Minor contribution from the left facial artery.   Right vertebral artery angiograms: The right vertebral artery, basilar artery and visualized portions of the bilateral PCA are normal in course and caliber. No aneurysms or abnormally high-flow, early draining veins are seen. No regions of abnormal hypervascularity are noted. The visualized dural sinuses are patent.   Right CCA angiograms: Cervical angiograms show normal course and caliber of the visualized right common carotid and internal carotid arteries. There are no significant stenoses.   Right CCA angiograms-cranial views: There is brisk vascular contrast filling of the right ACA and MCA vascular trees. Luminal caliber is smooth and tapering. No aneurysms or abnormally high-flow, early draining veins are seen. No regions of abnormal hypervascularity are  noted. The visualized dural sinuses are patent. Contribution of the ethmoidal branches of the ophthalmic artery to the nasal cavity is seen.   Right ECA angiograms: Prominent arterial blush of the nasal septum, inferior and lateral  walls of the right nasal cavity and inferior aspect of the left nasal cavity are seen from sphenopalatine artery. Minor contribution from the right facial artery.   PROCEDURE: Left sphenopalatine artery embolization:   Left external carotid artery angiogram were obtained with magnified frontal and lateral views of the face were obtained. Using biplane roadmap guidance, a Prowler select Plus microcatheter was navigated over a synchro select microguidewire into the left sphenopalatine artery. Frontal and lateral angiograms were obtained with microcatheter contrast injection. Endovascular embolization was then performed with 250-350 polyvinyl alcohol particles (PVA) in an iodinated contrast suspension. No evidence of nontarget embolization. The microcatheter was subsequently withdrawn. Left external carotid artery angiograms showed adequate embolization of the sphenopalatine artery with persistent supply of the nasal cavity by a greater palatine artery.   Using biplane roadmap, attempted navigation of a new prowler select plus microcatheter over a synchro select microguidewire into the right greater palatine artery resulted in herniation of the guide catheter into the aortic arch. The guide catheter was then exchanged over the wire and under fluoroscopy for a 5 French Simmons 2 glide catheter which was navigated over a 0.035" Terumo Glidewire into the right subclavian artery. The catheter tip was reformed in the aortic arch. The catheter was then navigated into the left common carotid artery. The glide catheter was then exchanged over the wire and under fluoroscopy for a benchmark catheter which was placed into the left external carotid artery. Frontal and lateral angiograms of the face were obtained.   Using biplane roadmap guidance, a headway duo microcatheter was navigated over a synchro select microguidewire into the left greater palatine artery. Frontal and lateral angiograms  were obtained with microcatheter contrast injection. Endovascular embolization was then performed with 250-350 polyvinyl alcohol particles (PVA) in an iodinated contrast suspension. No evidence of nontarget embolization. The microcatheter was subsequently withdrawn. Left external carotid artery angiograms showed adequate embolization of the sphenopalatine artery and greater palatine arteries. Left internal carotid artery angiogram showed no evidence of nontarget embolization to the intracranial circulation.   Right sphenopalatine artery embolization:   Right external carotid artery angiogram were obtained with magnified frontal and lateral views of the face were obtained. Using biplane roadmap guidance, a Prowler select Plus microcatheter was navigated over a synchro select microguidewire into the right sphenopalatine artery. Frontal and lateral angiograms were obtained with microcatheter contrast injection. Endovascular embolization was then performed with 250-350 polyvinyl alcohol particles (PVA) in an iodinated contrast suspension. No evidence of nontarget embolization. The microcatheter was subsequently withdrawn. Right external carotid artery angiograms showed adequate embolization of the sphenopalatine artery. Right common carotid artery angiogram showed no evidence of nontarget embolization to the intracranial circulation. The guide catheter was subsequently withdrawn.      Review of Systems: Out of a complete 14 system review, the patient complains of only the following symptoms, and all other reviewed systems are negative.:  Hemiparesis following brain tumor surgery at [facility] 2 -12-2022.   Social History   Socioeconomic History   Marital status: Married    Spouse name: Not on file   Number of children: Not on file   Years of education: Not on file   Highest education level: Not on file  Occupational History   Not on file  Tobacco Use  Smoking status: Former     Types: Cigarettes    Quit date: 02/25/2011    Years since quitting: 11.9   Smokeless tobacco: Never  Vaping Use   Vaping Use: Never used  Substance and Sexual Activity   Alcohol use: Not Currently    Alcohol/week: 14.0 standard drinks of alcohol    Types: 14 Cans of beer per week    Comment: quit 11-08-2021   Drug use: No   Sexual activity: Yes    Comment: E-cigarette users  Other Topics Concern   Not on file  Social History Narrative   Not on file   Social Determinants of Health   Financial Resource Strain: Not on file  Food Insecurity: Not on file  Transportation Needs: Not on file  Physical Activity: Not on file  Stress: Not on file  Social Connections: Not on file    Family History  Problem Relation Age of Onset   Cancer Other        brain cancer   Cancer Father        skin cancer   Cancer Maternal Grandmother 26       brain cancer   Alcohol abuse Brother    Alzheimer's disease Paternal Grandmother    Dementia Paternal Grandmother    Heart disease Paternal Grandfather     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   Depression    Epistaxis    Headache    Hypertension    PTSD (post-traumatic stress disorder)    Seizures (HCC)     Past Surgical History:  Procedure Laterality Date   BASAL CELL CARCINOMA EXCISION  1995   BRAIN SURGERY  12/29/2022   CRANIOTOMY FOR TUMOR Right 02/25/2011   IR ANGIO EXTERNAL CAROTID SEL EXT CAROTID BILAT MOD SED  11/16/2021   IR ANGIO INTRA EXTRACRAN SEL COM CAROTID INNOMINATE UNI R MOD SED  11/16/2021   IR ANGIO INTRA EXTRACRAN SEL INTERNAL CAROTID UNI L MOD SED  11/16/2021   IR ANGIO VERTEBRAL SEL VERTEBRAL UNI R MOD SED  11/16/2021   IR ANGIOGRAM FOLLOW UP STUDY  11/17/2021   IR NEURO EACH ADD'L AFTER BASIC UNI LEFT (MS)  11/17/2021   IR NEURO EACH ADD'L AFTER BASIC UNI RIGHT (MS)  11/17/2021   IR  TRANSCATH/EMBOLIZ  11/16/2021   IR US GUIDE VASC ACCESS RIGHT  11/16/2021   RADIOLOGY WITH ANESTHESIA N/A 11/16/2021   Procedure: IR WITH ANESTHESIA;  Surgeon: Baldemar Lenis, MD;  Location: [facility] OR;  Service: Radiology;  Laterality: N/A;   TOTAL HIP ARTHROPLASTY Right 12/21/2021   Procedure: RIGHT TOTAL HIP REPLACEMENT;  Surgeon: Tarry Kos, MD;  Location: [facility] OR;  Service: Orthopedics;  Laterality: Right;  3-C   WISDOM TOOTH EXTRACTION       Current Outpatient Medications on File Prior to Visit  Medication Sig Dispense Refill   acetaminophen (TYLENOL) 500 MG tablet Take 2 tablets (1,000 mg total) by mouth every 8 (eight) hours as needed. 30 tablet 0   b complex vitamins capsule Take 1 capsule by mouth daily.     brexpiprazole (REXULTI) 2 MG TABS tablet Take 1 tablet (2 mg total) by mouth daily. Take 0.5 mg daily for one week, then increase to 1 mg daily 30 tablet 1   doxazosin (CARDURA) 4 MG tablet Take 1 tablet (4 mg total) by mouth at bedtime.  90 tablet 1   EPINEPHrine 0.3 mg/0.3 mL IJ SOAJ injection Inject 0.3 mg into the muscle once as needed (for a severe, allergic reaction).      levocetirizine (XYZAL) 5 MG tablet TAKE 1 TABLET BY MOUTH EVERY DAY IN THE EVENING 90 tablet 1   Oxcarbazepine (TRILEPTAL) 300 MG tablet Take 1 tablet (300 mg total) by mouth 2 (two) times daily. 60 tablet 0   pregabalin (LYRICA) 100 MG capsule Take 1 capsule (100 mg total) by mouth 3 (three) times daily. 90 capsule 0   senna-docusate (SENOKOT-S) 8.6-50 MG tablet Take 2 tablets by mouth 2 (two) times daily.     traZODone (DESYREL) 50 MG tablet Take 1 tablet (50 mg total) by mouth at bedtime. 30 tablet 0   busPIRone (BUSPAR) 30 MG tablet Take 1 tablet (30 mg total) by mouth 2 (two) times daily. 180 tablet 1   No current facility-administered medications on file prior to visit.    Allergies  Allergen Reactions   Iodinated Contrast Media Rash and Anaphylaxis   Penicillins Shortness Of Breath  and Rash    Did it involve swelling of the face/tongue/throat, SOB, or low BP? Yes Did it involve sudden or severe rash/hives, skin peeling, or any reaction on the inside of your mouth or nose? No Did you need to seek medical attention at a hospital or doctor's office? No When did it last happen? Unk If all above answers are "NO", may proceed with cephalosporin use.     Shellfish Allergy Anaphylaxis and Rash   Shellfish-Derived Products Anaphylaxis and Rash     DIAGNOSTIC DATA (LABS, IMAGING, TESTING) - I reviewed patient records, labs, notes, testing and imaging myself where available.  Lab Results  Component Value Date   WBC 4.1 01/16/2023   HGB 11.7 (L) 01/16/2023   HCT 34.0 (L) 01/16/2023   MCV 98.8 01/16/2023   PLT 159 01/16/2023      Component Value Date/Time   NA 140 01/17/2023 0558   NA 141 06/21/2013 1505   K 4.0 01/17/2023 0558   K 4.2 06/21/2013 1505   CL 108 01/17/2023 0558   CO2 25 01/17/2023 0558   CO2 25 06/21/2013 1505   GLUCOSE 101 (H) 01/17/2023 0558   GLUCOSE 83 06/21/2013 1505   BUN 22 (H) 01/17/2023 0558   BUN 18.1 06/21/2013 1505   CREATININE 0.91 01/17/2023 0558   CREATININE 1.03 12/15/2021 1522   CREATININE 0.9 06/21/2013 1505   CALCIUM 8.6 (L) 01/17/2023 0558   CALCIUM 9.1 06/21/2013 1505   PROT 5.8 (L) 01/16/2023 0526   PROT 7.0 06/21/2013 1505   ALBUMIN 3.4 (L) 01/16/2023 0526   ALBUMIN 4.1 06/21/2013 1505   AST 22 01/16/2023 0526   AST 15 12/15/2021 1522   AST 15 06/21/2013 1505   ALT 40 01/16/2023 0526   ALT 19 12/15/2021 1522   ALT 13 06/21/2013 1505   ALKPHOS 44 01/16/2023 0526   ALKPHOS 59 06/21/2013 1505   BILITOT 0.1 (L) 01/16/2023 0526   BILITOT 0.3 12/15/2021 1522   BILITOT 0.33 06/21/2013 1505   GFRNONAA >60 01/17/2023 0558   GFRNONAA >60 12/15/2021 1522   GFRAA >60 04/30/2020 1533   Lab Results  Component Value Date   CHOL 213 (H) 05/17/2021   HDL 53.60 05/17/2021   LDLDIRECT 127.0 05/17/2021   TRIG 271.0 (H)  05/17/2021   CHOLHDL 4 05/17/2021   No results found for: "HGBA1C" Lab Results  Component Value Date   VITAMINB12 502 11/22/2021  Lab Results  Component Value Date   TSH 1.37 05/17/2021    PHYSICAL EXAM:  Today's Vitals   02/08/23 1535  BP: 108/75  Pulse: (!) 59  Weight: 217 lb (98.4 kg)  Height: 6\' 2"  (1.88 m)   Body mass index is 27.86 kg/m.   Wt Readings from Last 3 Encounters:  02/08/23 217 lb (98.4 kg)  01/11/23 210 lb 5.1 oz (95.4 kg)  01/05/23 216 lb 7.9 oz (98.2 kg)     Ht Readings from Last 3 Encounters:  02/08/23 6\' 2"  (1.88 m)  01/06/23 6\' 1"  (1.854 m)  01/05/23 6\' 1"  (1.854 m)      General: The patient is awake, alert and appears not in acute distress. The patient is well groomed. Head: Normocephalic, atraumatic.  Neck is supple. Mallampati 2,  neck circumference:16.5 inches . Nasal airflow  patent.  Cardiovascular:  Regular rate and cardiac rhythm by pulse,  without distended neck veins. Respiratory: Lungs are clear to auscultation.  Skin:  Without evidence of ankle edema, or rash. Trunk: NEUROLOGIC EXAM: The patient is awake and alert, oriented to place and time.   Memory subjective described as intact.  Attention span & concentration ability appears normal.  Speech is fluent,  without  dysarthria, dysphonia - he appears to have less word eloquence aphasia.  Mood and affect are aloof   Cranial nerves: no loss of smell or taste reported  Pupils are equal and briskly reactive to light. Funduscopic exam normal. .  Extraocular movements in vertical and horizontal planes were intact and without nystagmus. No Diplopia. He has a slightly droopier upper eye lid on the left.   Forehead innervation intact.  Visual fields by finger perimetry are intact. Hearing was is right sided impaired to finger rubbing.    Facial sensation, numbness on the left   Facial motor strength is asymmetric , left is weak.  and tongue moved towards the left , deviating and  drooling left angle of the mouth is droopy.  midline.  Neck ROM : rotation, tilt and flexion extension were normal for age and shoulder shrug was symmetrical.    Motor exam: suprisingly strong bilateral grip-  Left biceps and triceps paralyzed, waxy tone,  wrist weakness, pronator drift left.    Sensory:  Fine touch, pinprick were all decreased in left face left torso, left arm and hand, all knuckles all fingertips of the left hand.  There was a preserved feeling of vibration on knees and ankles. .  Proprioception tested in the upper extremities was abnormal, left side weakness.  Dysmetria.    Coordination: Rapid alternating movements in the fingers/hands were reduced speed on the left arm. The Finger-to-nose maneuver was with  dysmetria no tremor.    Gait and station: Patient could rise unassisted from a seated position, walked without assistive , kept his weaker arm flexed in front of his torso. d Toe and heel walk were deferred.  Deep tendon reflexes:  trace in left patella only, trace in biceps ,anterior , triceps.   Left babinski up-going-     We visited the imaging sites and imaging library and the patient brought his most recent images on CD ROM     ASSESSMENT AND PLAN 47 y.o. year old male  here with:  1) Brain tumor surgery on 12-29-2022: Anaplastic oligodendroglioma of temporal lobe (HCC)   intraoperative SDH over the right brain convexity with 8 mm midline shift and resulting left hemiparesis and hemisensory loss.    2) known  history of recurrent brain cancer, Anaplastic oligodendroglioma of temporal lobe (HCC)  new/ recurrent  tissue was discovered in late 2023, and there were no vascular risk factors noted at the time.   3) needing stroke follow  with PT, OT, ST locally to continue.  Treatment of depression and psychological support needed for hi and his spouse.  He has a Veterinary surgeon .  Refrain from alcohol use !   I plan to follow up either personally or through our NP  within 4-5 months.   He has no ability to return to work at [employer] with his current symptoms.    I would like to share this note with  Etta Grandchild, MD .   After spending a total time of  40  minutes face to face and additional time for physical and neurologic examination, review of laboratory studies,  personal review of imaging studies, reports and results of other testing and review of referral information / records as far as provided in visit,   Electronically signed by: Melvyn Novas, MD 02/08/2023 3:53 PM  [facility] Board certified by The [employer] of Sleep Medicine and Diplomate of the [employer] of Sleep Medicine. Board certified In Neurology through the ABPN, Fellow of the [employer] of Neurology. Medical Director of [facility].

## 2023-02-09 ENCOUNTER — Other Ambulatory Visit: Payer: BC Managed Care – PPO

## 2023-02-10 ENCOUNTER — Ambulatory Visit: Payer: BC Managed Care – PPO

## 2023-02-10 DIAGNOSIS — R1311 Dysphagia, oral phase: Secondary | ICD-10-CM | POA: Diagnosis not present

## 2023-02-10 DIAGNOSIS — R41840 Attention and concentration deficit: Secondary | ICD-10-CM | POA: Diagnosis not present

## 2023-02-10 DIAGNOSIS — F4310 Post-traumatic stress disorder, unspecified: Secondary | ICD-10-CM | POA: Diagnosis not present

## 2023-02-10 DIAGNOSIS — R278 Other lack of coordination: Secondary | ICD-10-CM | POA: Diagnosis not present

## 2023-02-10 DIAGNOSIS — I69354 Hemiplegia and hemiparesis following cerebral infarction affecting left non-dominant side: Secondary | ICD-10-CM | POA: Diagnosis not present

## 2023-02-10 DIAGNOSIS — R41841 Cognitive communication deficit: Secondary | ICD-10-CM

## 2023-02-10 DIAGNOSIS — R4701 Aphasia: Secondary | ICD-10-CM | POA: Diagnosis not present

## 2023-02-10 DIAGNOSIS — C712 Malignant neoplasm of temporal lobe: Secondary | ICD-10-CM | POA: Diagnosis not present

## 2023-02-10 DIAGNOSIS — M6281 Muscle weakness (generalized): Secondary | ICD-10-CM | POA: Diagnosis not present

## 2023-02-10 DIAGNOSIS — F329 Major depressive disorder, single episode, unspecified: Secondary | ICD-10-CM | POA: Diagnosis not present

## 2023-02-10 DIAGNOSIS — R208 Other disturbances of skin sensation: Secondary | ICD-10-CM | POA: Diagnosis not present

## 2023-02-10 NOTE — Therapy (Signed)
OUTPATIENT SPEECH LANGUAGE PATHOLOGY TREATMENT   Patient Name: Shawn York MRN: [id number] DOB:1975/10/09, 47 y.o., male Today's Date: 02/10/2023  PCP: Etta Grandchild, MD REFERRING PROVIDER: Milinda Antis, PA-C;  Elijah Birk, PA-C (doc)  END OF SESSION:  End of Session - 02/10/23 1022     Visit Number 4    Number of Visits 17    Date for SLP Re-Evaluation 03/31/23    SLP Start Time 1022   arrived late   SLP Stop Time  1100    SLP Time Calculation (min) 38 min    Activity Tolerance Patient tolerated treatment well               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   Depression    Epistaxis    Headache    Hypertension    PTSD (post-traumatic stress disorder)    Seizures (HCC)    Past Surgical History:  Procedure Laterality Date   BASAL CELL CARCINOMA EXCISION  1995   BRAIN SURGERY  12/29/2022   CRANIOTOMY FOR TUMOR Right 02/25/2011   IR ANGIO EXTERNAL CAROTID SEL EXT CAROTID BILAT MOD SED  11/16/2021   IR ANGIO INTRA EXTRACRAN SEL COM CAROTID INNOMINATE UNI R MOD SED  11/16/2021   IR ANGIO INTRA EXTRACRAN SEL INTERNAL CAROTID UNI L MOD SED  11/16/2021   IR ANGIO VERTEBRAL SEL VERTEBRAL UNI R MOD SED  11/16/2021   IR ANGIOGRAM FOLLOW UP STUDY  11/17/2021   IR NEURO EACH ADD'L AFTER BASIC UNI LEFT (MS)  11/17/2021   IR NEURO EACH ADD'L AFTER BASIC UNI RIGHT (MS)  11/17/2021   IR TRANSCATH/EMBOLIZ  11/16/2021   IR US GUIDE VASC ACCESS RIGHT  11/16/2021   RADIOLOGY WITH ANESTHESIA N/A 11/16/2021   Procedure: IR WITH ANESTHESIA;  Surgeon: Baldemar Lenis, MD;  Location: [facility];  Service: Radiology;  Laterality: N/A;   TOTAL HIP ARTHROPLASTY Right 12/21/2021   Procedure: RIGHT TOTAL HIP REPLACEMENT;  Surgeon: Tarry Kos, MD;  Location: [facility] OR;  Service: Orthopedics;  Laterality: Right;  3-C   WISDOM TOOTH EXTRACTION     Patient  Active Problem List   Diagnosis Date Noted   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 02/08/2023   Depression with anxiety 01/11/2023   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   Coagulation defect, unspecified (HCC) 11/02/2022   Amnestic MCI (mild cognitive impairment with memory loss) 11/02/2022   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) 11/02/2022   Abnormal finding on MRI of brain 05/30/2022   Aneurysm of ascending aorta without rupture (HCC) 02/28/2022   Anemia due to acquired thiamine deficiency 12/01/2021   Seasonal allergic rhinitis due to pollen 11/08/2021   Family history of dissecting aortic aneurysm 11/08/2021   Contrast media allergy 11/08/2021   Avascular necrosis of bone of right hip (HCC) 11/02/2021   Avascular necrosis of bone of left hip (HCC) 11/02/2021   Primary hypertension 05/17/2021   Encounter for general adult medical examination with abnormal findings 05/17/2021   Posttraumatic stress disorder 06/05/2018   Nightmares associated with chronic post-traumatic stress disorder 09/27/2017   Brain tumor, glioma (HCC) 09/27/2017   Severe episode of recurrent major depressive disorder, without psychotic features (HCC)    Major depressive disorder, recurrent episode (HCC) 08/17/2016   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  ONSET DATE: 12/29/22  REFERRING DIAG: C71.2 (ICD-10-CM) - Anaplastic oligodendroglioma of temporal lobe (HCC) I63.112 (ICD-10-CM) - 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)   THERAPY DIAG:  Cognitive communication deficit  Rationale for Evaluation and Treatment: Rehabilitation  SUBJECTIVE:   SUBJECTIVE STATEMENT: "I did the homework then forgot it at home" Pt accompanied by: self  PERTINENT HISTORY:  Patient is a 47 year old male with history of AVN of b-hips s/p R-THR, HTN, PTSD, depression w/anxiety, ascending aortic  aneurysm, 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 Adline Peals with 70% removal of tumor. Metronomic Temodar initiated. He completed standard radiation with concurrent temozolomide and Avastin in 2013. MRI on 11/09/2022 suspicious for progression and repeat MRI done 4 weeks later found to have FLAIR lesion. He underwent repeat right temporal craniotomy for resection of tumor on 12/29/2022 by Dr. Lavonna Monarch at [facility] This was complicated by bleeding from a branch of the MCA and post-op motor deficits of left side and left facial droop. Follow up MRI showed subjacent extra axial hemorrhage with layering blood products in right temporal lobe resection cavity with small volume SAH and trace cytotoxic edema in resection cavity. Therapy evaluations completed with recommendations for CIR. Patient admitted 01/06/23. SLP consult today due to reports of cognitive changes and biting is tongue when eating.   Pt's wife sent a message to pt's therapists via MyChart on 01/25/23 which stated: Hello Dr. Sundra Aland, this is Shawn York's wife [name] and I wanted to write to you prior to our upcoming visit tomorrow to inform you of the current situation. Muaz is having a hard time following through with simple tasks and it's causing a lot of stress in the home. He does not want to hear how important it is for him to do his PT from me, but he is not doing the exercises like he should. His depression is very severe and is aiding in the current situation at home. I wanted you to know ahead of time so I don't have to bring this up in front of him at our appointment.   PAIN:  Are you having pain? No  FALLS: Has patient fallen in last 6 months?  No  PATIENT GOALS: Return to work  OBJECTIVE:   DIAGNOSTIC FINDINGS:  MR BRAIN WO CONTRAST December 12 2022 IMPRESSION: 1. Changes since 2020 MRI in the residual superior Right temporal gyrus highly suspicious for  Tumor Recurrence, including new mass-like T2/FLAIR hyperintense gyral expansion and small new multifocal nodular enhancement there. No intracranial mass effect 2. Also new since 2020 but chronic appearing lacunar infarcts in the right basal ganglia. 3. No other acute intracranial abnormality.   MR BRAIN WO CONTRAST  Jan 17 2023 IMPRESSION: 1. 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 Previously seen nodular foci of contrast enhancement in the right temporal lobe are no longer visualized. 2. New T2/FLAIR hyperintense signal abnormality in the right corona radiata with associated curvilinear contrast enhancement in these regions. This could potentially represent a region of subacute infarct. Recommend attention on follow-up. 3. Small volume subarachnoid hemorrhage and postoperative subdural hematoma along the right cerebral convexity, as above. 4. 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 5. Dural enhancement and thickening along the left occipital convexity and likely the left temporal convexity, which may  be due to CSF hypotension.  Discharge from SLP service:Jan 17, 2023 Patient has met 1 of 1 long term goals.  Patient to discharge at overall Modified Independent level.  Reasons goals not met: N/A  Clinical Impression/Discharge Summary: Patient has made functional gains and has met 1 of 1 LTGs this admission. Currently, patient requires extra processing time and is overall Mod I for recall of daily information with use of compensatory strategies. Patient continues to report impaired sensation of his oral-motor musculature resulting in him biting his lips and tongue intermittently. However, his strength appears intact without evidence of dysarthria and with  100% intelligibility. Patient and family education is complete and patient will discharge home with assistance from family. Due to patient's high level of functioning and overall independence at baseline, f/u outpatient SLP services are recommended.  Care Partner:  Caregiver Able to Provide Assistance: Yes  Type of Caregiver Assistance: Cognitive;Physical Recommendation:  Outpatient SLP;24 hour supervision/assistance  Rationale for SLP Follow Up: Maximize cognitive function and independence;Reduce caregiver burden   COGNITION: Overall cognitive status: Impaired Areas of impairment:  Attention: Impaired: Alternating, Divided Awareness: Impaired: Emergent, Neglect, and Comment: Karsten demonstrated decr'd awareness of errors (emergent awareness) especially when error was on lt side. Executive function: Impaired: Problem solving, Organization, Planning, Error awareness, Self-correction, and Slow processing Behavior: Within functional limits, despite wife's note to therapists; Pt smiled at SLP x2-3 during evaluation, and made one joking comment. Functional deficits: Pt noted he was having more difficulty paying attention after sx, and overall told SLP his thinking speed felt "slower", and that he feels he could not multi-task at work like he used to prior to [employer].    STANDARDIZED ASSESSMENTS: CLQT was completed. Result below: The Cognitive Linguistic Quick Test (CLQT) was administered to assess the relative status of five cognitive domains: attention, memory, language, executive functioning, and visuospatial skills. Scores from 10 tasks were used to estimate severity ratings (standardized for age groups 18-69 years and 70-89 years) for each domain, a clock drawing task, as well as an overall composite severity rating of cognition.      Task Score Criterion Cut Scores  Personal Facts 8/8 8  Symbol Cancellation 12/12 11  Confrontation Naming 10/10 10  Clock Drawing  12/13 12  Story Retelling 9/10 6   Symbol Trails 6/10 9  Generative Naming 4/9 5  Design Memory 4/6 5  Mazes  7/8 7  Design Generation 8/13 6    Cognitive Domain Composite Score Severity Rating  Attention 188/215 WNL  Memory 184/185 High Mild  Executive Function 25/40 Low WNL  Language 31/37 WNL  Visuospatial Skills 81/105 High Mild  Clock Drawing  12/13 WNL  Composite Severity Rating 3.6 Low WNL           PATIENT REPORTED OUTCOME MEASURES (PROM): Cognitive Function: was given to pt 02/01/23 and asked to return next session   TODAY'S TREATMENT:  DATE:  02/10/23: Completed HEP but forgot at home. Denied difficulty as primary SLP assisted with initial game plan for execution. Reduced attention noted in conversation with pt fixating on computer screen. Attention mildly improved following SLP commenting on reduced attention and minimizing visual distractions. Targeted attention to detail task with following specific instruction and decoding message. Pt completed tasks x2 with pt independently noting errors mid-task which skewed result. Pt able to correct with additional time and mod A. Targeted metacognitive skills, in which pt unable to ID solution to aid performance. SLP prompted marking letters to aid attention and processing, in which pt stated "but that is so slow". Re-educated importance of slow rate to aid processing speed and attention to increase performance. Similar result occurred for second task. Pt verbally identified mistake but did not correct errors without prompt. Mild background noise (typing and voices in gym) was not impactful on performance. Pt reported tongue feels "fat" and reduced left facial sensation, which impacts intelligibility intermittently. Targeted velar sounds at word level for varied placements and increased number of syllables with rare errors noted. Benefited  from slower rate for increased articulatory accuracy.   02/03/23: Pt did not return cognitive function PROM. SLP provided pt with targeted attention and organization tasks. Pt worked with detailed instrucutions and mathematical problems and did not double check his answers. Two errors were made which were not noticed by pt. SLP stressed to pt he will need to double check his answers now more than ever before. Pt said he would do so on remaining homework problems. SLP also told pt to download MyChart app to track appointments.   02/01/23: SLP completed the CLQT and discussed results with pt. Organization, planning, memory, and attention appear to be pt's deficit areas for SLP to target. Results are above.  01/26/23: SLP discussed some compensations for mealtime as pt stated he is having difficulty feeling food on the lt labial margin during meals - setting a mirror up in front of him at meals was suggested.  Standardized eval results, as much as pt has completed, were also reviewed.  PATIENT EDUCATION: Education details: see "today's treatment" Person educated: Patient Education method: Explanation, Demonstration, and Verbal cues Education comprehension: verbalized understanding and needs further education   GOALS: Goals reviewed with patient? No  SHORT TERM GOALS: Target date: 03/03/23  Pt will demo alternating attention with min-mod complex tasks involving language with compensations 90%, in 3 sessions Baseline: Goal status: IN PROGRESS  2.  Pt/family will report pt completes household tasks involving language for 30 consecutive minutes with compensations if necessary, in 3sessions Baseline:  Goal status: IN PROGRESS  3.  Pt will demo use of cognitive (memory, planning/organization, attention, awareness) compensations when performing detailed language tasks in order to achieve 90% success, in 3 sessions Baseline:  Goal status: IN PROGRESS  4.  Pt will demo compensations with POs for  reported labial leakage due to facial numbness on lt (including upper/lower lips)  Baseline:  Goal Status: IN PROGRESS   LONG TERM GOALS: Target date: 03/31/23  Pt will demo alternating attention with mod-max complex work-like tasks involving language (using interruptions) with compensations 100%, in 3 sessions Baseline:  Goal status: IN PROGRESS  2.  Pt/family will report pt completes household tasks involving language for 60 minutes with compensations if necessary, in 4 sessions Baseline:  Goal status: IN PROGRESS  3.  Pt will demo knowledge of need to employ compensations when performing detailed language tasks in order to achieve 100% success,  in 4 sessions Baseline:  Goal status: IN PROGRESS  4.  Pt will tell SLP 3 compensations he may need to use at work to improve accuracy with recall or attention Baseline:  Goal status: IN PROGRESS  5.  Pt will score higher on cognitive PROM in the last 1-2 sessions than on initial administration  Baseline:  Goal Status: IN PROGRESS   ASSESSMENT:  CLINICAL IMPRESSION: Patient is a 47 y.o. male who was seen today therapy targeting planning, organization, and attention. See "today's treatment" for more details of today's session. 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 I don't think I can do that now." Pt appeared with maybe slightly flat affect today but motivated for change for cognitive linguistics. Pt's cognition as it stands currently would be detrimental to his job security and pt would benefit from skilled ST to improve these skills.   OBJECTIVE IMPAIRMENTS: include attention, memory, awareness, and executive functioning. These impairments are limiting patient from return to work, managing medications, managing appointments, managing finances, household  responsibilities, ADLs/IADLs, and effectively communicating at home and in community. Factors affecting potential to achieve goals and functional outcome are cooperation/participation level (according to wife, [name]). Patient will benefit from skilled SLP services to address above impairments and improve overall function.  REHAB POTENTIAL: Good  PLAN:  SLP FREQUENCY: 2x/week  SLP DURATION: 8 weeks (or 17 sessions)  PLANNED INTERVENTIONS: Language facilitation, Environmental controls, Cueing hierachy, Cognitive reorganization, Internal/external aids, Functional tasks, SLP instruction and feedback, Compensatory strategies, and Patient/family education    Gracy Racer, CCC-SLP 02/10/2023, 10:22 AM

## 2023-02-14 ENCOUNTER — Other Ambulatory Visit (HOSPITAL_COMMUNITY): Payer: BC Managed Care – PPO

## 2023-02-14 ENCOUNTER — Ambulatory Visit: Payer: BC Managed Care – PPO | Admitting: Occupational Therapy

## 2023-02-14 ENCOUNTER — Ambulatory Visit: Payer: BC Managed Care – PPO

## 2023-02-14 ENCOUNTER — Ambulatory Visit (HOSPITAL_COMMUNITY): Payer: BC Managed Care – PPO

## 2023-02-14 ENCOUNTER — Ambulatory Visit (HOSPITAL_COMMUNITY)
Admission: RE | Admit: 2023-02-14 | Discharge: 2023-02-14 | Disposition: A | Discharge status: Home / Self Care | Payer: BC Managed Care – PPO | Source: Ambulatory Visit | Attending: Internal Medicine | Admitting: Internal Medicine

## 2023-02-14 DIAGNOSIS — R278 Other lack of coordination: Secondary | ICD-10-CM

## 2023-02-14 DIAGNOSIS — R4701 Aphasia: Secondary | ICD-10-CM | POA: Diagnosis not present

## 2023-02-14 DIAGNOSIS — I69354 Hemiplegia and hemiparesis following cerebral infarction affecting left non-dominant side: Secondary | ICD-10-CM

## 2023-02-14 DIAGNOSIS — I7121 Aneurysm of the ascending aorta, without rupture: Secondary | ICD-10-CM

## 2023-02-14 DIAGNOSIS — R41840 Attention and concentration deficit: Secondary | ICD-10-CM

## 2023-02-14 DIAGNOSIS — R208 Other disturbances of skin sensation: Secondary | ICD-10-CM

## 2023-02-14 DIAGNOSIS — M6281 Muscle weakness (generalized): Secondary | ICD-10-CM | POA: Diagnosis not present

## 2023-02-14 DIAGNOSIS — R41841 Cognitive communication deficit: Secondary | ICD-10-CM

## 2023-02-14 DIAGNOSIS — R1311 Dysphagia, oral phase: Secondary | ICD-10-CM | POA: Diagnosis not present

## 2023-02-14 DIAGNOSIS — I712 Thoracic aortic aneurysm, without rupture, unspecified: Secondary | ICD-10-CM | POA: Diagnosis not present

## 2023-02-14 MED ORDER — IOHEXOL 350 MG/ML SOLN
80.0000 mL | Freq: Once | INTRAVENOUS | Status: AC | PRN
  Administered 2023-02-14: 80 mL via INTRAVENOUS

## 2023-02-14 NOTE — Therapy (Signed)
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   Depression    Epistaxis    Headache    Hypertension    PTSD (post-traumatic stress disorder)    Seizures (HCC)    Past Surgical History:  Procedure Laterality Date   BASAL CELL CARCINOMA EXCISION  1995   BRAIN SURGERY  12/29/2022   CRANIOTOMY FOR TUMOR Right 02/25/2011   IR ANGIO EXTERNAL CAROTID SEL EXT CAROTID BILAT MOD SED  11/16/2021   IR ANGIO INTRA EXTRACRAN SEL COM CAROTID INNOMINATE UNI R MOD SED  11/16/2021   IR ANGIO INTRA EXTRACRAN SEL INTERNAL CAROTID UNI L MOD SED  11/16/2021   IR ANGIO VERTEBRAL SEL VERTEBRAL UNI R MOD SED  11/16/2021   IR ANGIOGRAM FOLLOW UP STUDY  11/17/2021   IR NEURO EACH ADD'L AFTER BASIC UNI LEFT (MS)  11/17/2021   IR NEURO EACH ADD'L AFTER BASIC UNI RIGHT (MS)  11/17/2021   IR TRANSCATH/EMBOLIZ  11/16/2021   IR US GUIDE VASC ACCESS RIGHT  11/16/2021   RADIOLOGY WITH ANESTHESIA N/A 11/16/2021   Procedure: IR WITH ANESTHESIA;  Surgeon: Baldemar Lenis, MD;  Location: [facility] OR;  Service: Radiology;  Laterality: N/A;   TOTAL HIP ARTHROPLASTY Right 12/21/2021   Procedure: RIGHT TOTAL HIP REPLACEMENT;  Surgeon: Tarry Kos, MD;  Location: [facility] OR;  Service: Orthopedics;  Laterality: Right;  3-C   WISDOM TOOTH EXTRACTION     Patient Active Problem  List   Diagnosis Date Noted   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 02/08/2023   Depression with anxiety 01/11/2023   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   Coagulation defect, unspecified (HCC) 11/02/2022   Amnestic MCI (mild cognitive impairment with memory loss) 11/02/2022   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) 11/02/2022   Abnormal finding on MRI of brain 05/30/2022   Aneurysm of ascending aorta without rupture (HCC) 02/28/2022   Anemia due to acquired thiamine deficiency 12/01/2021   Seasonal allergic rhinitis due to pollen 11/08/2021   Family history of dissecting aortic aneurysm 11/08/2021   Contrast media allergy 11/08/2021   Avascular necrosis of bone of right hip (HCC) 11/02/2021   Avascular necrosis of bone of left hip (HCC) 11/02/2021   Primary hypertension 05/17/2021   Encounter for general adult medical examination with abnormal findings 05/17/2021   Posttraumatic stress disorder 06/05/2018   Nightmares associated with chronic post-traumatic stress disorder 09/27/2017   Brain tumor, glioma (HCC) 09/27/2017   Severe episode of recurrent major depressive disorder, without psychotic features (HCC)    Major depressive disorder, recurrent episode (HCC) 08/17/2016   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 One sheet was 75% complete, and then second sheet was completely unfinished. Pt accompanied by: self  PERTINENT HISTORY:  Patient is a 47 year old male with history  of AVN of b-hips s/p R-THR, HTN, PTSD, depression w/anxiety, ascending aortic aneurysm, 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 Adline Peals with 70% removal of tumor. Metronomic Temodar initiated. He completed standard radiation with concurrent temozolomide and Avastin in 2013. MRI on 11/09/2022 suspicious for progression and repeat MRI done 4 weeks later found to have FLAIR lesion. He underwent repeat right temporal craniotomy for resection of tumor on 12/29/2022 by Dr. Lavonna Monarch at [facility]. This was complicated by bleeding from a branch of the MCA and post-op motor deficits of left side and left facial droop. Follow up MRI showed subjacent extra axial hemorrhage with layering blood products in right temporal lobe resection cavity with small volume SAH and trace cytotoxic edema in resection cavity. Therapy evaluations completed with recommendations for CIR. Patient admitted 01/06/23. SLP consult today due to reports of cognitive changes and biting is tongue when eating.   Pt's wife sent a message to pt's therapists via MyChart on 01/25/23 which stated: Hello Dr. Sundra Aland, this is Jaceion's wife [name] and I wanted to write to you prior to our upcoming visit tomorrow to inform you of the current situation. Bengy is having a hard time following through with simple tasks and it's causing a lot of stress in the home. He does not want to hear how important it is for him to do his PT from me, but he is not doing the exercises like he should. His depression is very severe and is aiding in the current situation at home. I wanted you to know ahead of time so I don't have to bring this up in front of him at our appointment.   PAIN:  Are you having pain? No  FALLS: Has patient fallen in last 6 months?  No  PATIENT GOALS: Return to work  OBJECTIVE:   DIAGNOSTIC FINDINGS:  MR BRAIN WO CONTRAST December 12 2022 IMPRESSION: 1. Changes since  2020 MRI in the residual superior Right temporal gyrus highly suspicious for Tumor Recurrence, including new mass-like T2/FLAIR hyperintense gyral expansion and small new multifocal nodular enhancement there. No intracranial mass effect 2. Also new since 2020 but chronic appearing lacunar infarcts in the right basal ganglia. 3. No other acute intracranial abnormality.   MR BRAIN WO CONTRAST  Jan 17 2023 IMPRESSION: 1. 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 Previously seen nodular foci of contrast enhancement in the right temporal lobe are no longer visualized. 2. New T2/FLAIR hyperintense signal abnormality in the right corona radiata with associated curvilinear contrast enhancement in these regions. This could potentially represent a region of subacute infarct. Recommend attention on follow-up. 3. Small volume subarachnoid hemorrhage and postoperative subdural hematoma along the right cerebral convexity, as above. 4. 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 5. Dural enhancement and thickening along  the left occipital convexity and likely the left temporal convexity, which may be due to CSF hypotension.  Discharge from SLP service:Jan 17, 2023 Patient has met 1 of 1 long term goals.  Patient to discharge at overall Modified Independent level.  Reasons goals not met: N/A  Clinical Impression/Discharge Summary: Patient has made functional gains and has met 1 of 1 LTGs this admission. Currently, patient requires extra processing time and is overall Mod I for recall of daily information with use of compensatory strategies. Patient continues to report impaired sensation of his oral-motor musculature resulting in him biting his lips and tongue intermittently.  However, his strength appears intact without evidence of dysarthria and with 100% intelligibility. Patient and family education is complete and patient will discharge home with assistance from family. Due to patient's high level of functioning and overall independence at baseline, f/u outpatient SLP services are recommended.  Care Partner:  Caregiver Able to Provide Assistance: Yes  Type of Caregiver Assistance: Cognitive;Physical Recommendation:  Outpatient SLP;24 hour supervision/assistance  Rationale for SLP Follow Up: Maximize cognitive function and independence;Reduce caregiver burden   PATIENT REPORTED OUTCOME MEASURES (PROM): Cognitive Function: was given to pt 02/01/23 and asked to return next session   TODAY'S TREATMENT:                                                                                                                                         DATE:  02/14/23: Pt brought homework folder today, however homework was unfinished as described in "s". Pt was unaware homework was unfinished.  He was 10 minutes late for initial session (OT), reported to SLP "My mom was pushing me out of the house, I was taking a birdbath." When asked about this pt demonstrated reduced anticipatory awareness and stated he "always" had difficulty with time management at work as well - started projects too late or would take more time that he was allotted to complete.  Today SLP cued pt to look at task prior to completing it to familarize himself with the task proir to jumping in. In these detailed functional tasks pt completed with 100% accuracy x2, but third task with 60% success (3/5 correct). He did not double check answers, which was the initial "takeaway" SLP reiterated to pt about not completing his homework. When asked how he could improve his accuracy he told SLP he should have double checked his answers.  02/10/23: Completed HEP but forgot at home. Denied difficulty as primary SLP assisted with  initial game plan for execution. Reduced attention noted in conversation with pt fixating on computer screen. Attention mildly improved following SLP commenting on reduced attention and minimizing visual distractions. Targeted attention to detail task with following specific instruction and decoding message. Pt completed tasks x2 with pt independently noting errors mid-task which skewed result. Pt able to correct with additional time and mod A. Targeted metacognitive skills, in which  pt unable to ID solution to aid performance. SLP prompted marking letters to aid attention and processing, in which pt stated "but that is so slow". Re-educated importance of slow rate to aid processing speed and attention to increase performance. Similar result occurred for second task. Pt verbally identified mistake but did not correct errors without prompt. Mild background noise (typing and voices in gym) was not impactful on performance. Pt reported tongue feels "fat" and reduced left facial sensation, which impacts intelligibility intermittently. Targeted velar sounds at word level for varied placements and increased number of syllables with rare errors noted. Benefited from slower rate for increased articulatory accuracy.   02/03/23: Pt did not return cognitive function PROM. SLP provided pt with targeted attention and organization tasks. Pt worked with detailed instrucutions and mathematical problems and did not double check his answers. Two errors were made which were not noticed by pt. SLP stressed to pt he will need to double check his answers now more than ever before. Pt said he would do so on remaining homework problems. SLP also told pt to download MyChart app to track appointments.   02/01/23: SLP completed the CLQT and discussed results with pt. Organization, planning, memory, and attention appear to be pt's deficit areas for SLP to target. Results are above.  01/26/23: SLP discussed some compensations for mealtime as  pt stated he is having difficulty feeling food on the lt labial margin during meals - setting a mirror up in front of him at meals was suggested.  Standardized eval results, as much as pt has completed, were also reviewed.  PATIENT EDUCATION: Education details: see "today's treatment" Person educated: Patient Education method: Explanation, Demonstration, and Verbal cues Education comprehension: verbalized understanding and needs further education   GOALS: Goals reviewed with patient? No  SHORT TERM GOALS: Target date: 03/03/23  Pt will demo alternating attention with min-mod complex tasks involving language with compensations 90%, in 3 sessions Baseline: Goal status: IN PROGRESS  2.  Pt/family will report pt completes household tasks involving language for 30 consecutive minutes with compensations if necessary, in 3sessions Baseline:  Goal status: IN PROGRESS  3.  Pt will demo use of cognitive (memory, planning/organization, attention, awareness) compensations when performing detailed language tasks in order to achieve 90% success, in 3 sessions Baseline:  Goal status: IN PROGRESS  4.  Pt will demo compensations with POs for reported labial leakage due to facial numbness on lt (including upper/lower lips)  Baseline:  Goal Status: IN PROGRESS   LONG TERM GOALS: Target date: 03/31/23  Pt will demo alternating attention with mod-max complex work-like tasks involving language (using interruptions) with compensations 100%, in 3 sessions Baseline:  Goal status: IN PROGRESS  2.  Pt/family will report pt completes household tasks involving language for 60 minutes with compensations if necessary, in 4 sessions Baseline:  Goal status: IN PROGRESS  3.  Pt will demo knowledge of need to employ compensations when performing detailed language tasks in order to achieve 100% success, in 4 sessions Baseline:  Goal status: IN PROGRESS  4.  Pt will tell SLP 3 compensations he may need to use  at work to improve accuracy with recall or attention Baseline:  Goal status: IN PROGRESS  5.  Pt will score higher on cognitive PROM in the last 1-2 sessions than on initial administration  Baseline:  Goal Status: IN PROGRESS   ASSESSMENT:  CLINICAL IMPRESSION: Patient is a 47 y.o. male who was seen today therapy targeting planning, organization, and  attention. See "today's treatment" for more details of today's session. Pt showing decr'd carryover of verbalized techniques for improved attention and attention to detail. Pt's cognition as it stands currently would be detrimental to his job security and pt would benefit from skilled ST to improve these skills.   OBJECTIVE IMPAIRMENTS: include attention, memory, awareness, and executive functioning. These impairments are limiting patient from return to work, managing medications, managing appointments, managing finances, household responsibilities, ADLs/IADLs, and effectively communicating at home and in community. Factors affecting potential to achieve goals and functional outcome are cooperation/participation level (according to wife, [name]). Patient will benefit from skilled SLP services to address above impairments and improve overall function.  REHAB POTENTIAL: Good  PLAN:  SLP FREQUENCY: 2x/week  SLP DURATION: 8 weeks (or 17 sessions)  PLANNED INTERVENTIONS: Language facilitation, Environmental controls, Cueing hierachy, Cognitive reorganization, Internal/external aids, Functional tasks, SLP instruction and feedback, Compensatory strategies, and Patient/family education    [facility], CCC-SLP 02/14/2023, 3:51 PM

## 2023-02-14 NOTE — Therapy (Signed)
OUTPATIENT OCCUPATIONAL THERAPY NEURO TREATMENT NOTE  Patient Name: Shawn York MRN: [id number] DOB:1976-08-28, 47 y.o., male Today's Date: 02/14/2023  PCP: Etta Grandchild, MD REFERRING PROVIDER: Milinda Antis, PA-C  END OF SESSION:  OT End of Session - 02/14/23 1544     Visit Number 5    Number of Visits 17    Date for OT Re-Evaluation 03/24/23    Authorization Type [employer]    OT Start Time 1457   arrival time   OT Stop Time 1532    OT Time Calculation (min) 35 min    Activity Tolerance No increased pain;Patient tolerated treatment well;Patient limited by fatigue;Patient limited by pain    Behavior During Therapy [facility] for tasks assessed/performed                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   Depression    Epistaxis    Headache    Hypertension    PTSD (post-traumatic stress disorder)    Seizures (HCC)    Past Surgical History:  Procedure Laterality Date   BASAL CELL CARCINOMA EXCISION  1995   BRAIN SURGERY  12/29/2022   CRANIOTOMY FOR TUMOR Right 02/25/2011   IR ANGIO EXTERNAL CAROTID SEL EXT CAROTID BILAT MOD SED  11/16/2021   IR ANGIO INTRA EXTRACRAN SEL COM CAROTID INNOMINATE UNI R MOD SED  11/16/2021   IR ANGIO INTRA EXTRACRAN SEL INTERNAL CAROTID UNI L MOD SED  11/16/2021   IR ANGIO VERTEBRAL SEL VERTEBRAL UNI R MOD SED  11/16/2021   IR ANGIOGRAM FOLLOW UP STUDY  11/17/2021   IR NEURO EACH ADD'L AFTER BASIC UNI LEFT (MS)  11/17/2021   IR NEURO EACH ADD'L AFTER BASIC UNI RIGHT (MS)  11/17/2021   IR TRANSCATH/EMBOLIZ  11/16/2021   IR US GUIDE VASC ACCESS RIGHT  11/16/2021   RADIOLOGY WITH ANESTHESIA N/A 11/16/2021   Procedure: IR WITH ANESTHESIA;  Surgeon: Baldemar Lenis, MD;  Location: [facility] OR;  Service: Radiology;  Laterality: N/A;   TOTAL HIP ARTHROPLASTY Right 12/21/2021   Procedure: RIGHT TOTAL HIP REPLACEMENT;   Surgeon: Tarry Kos, MD;  Location: [facility] OR;  Service: Orthopedics;  Laterality: Right;  3-C   WISDOM TOOTH EXTRACTION     Patient Active Problem List   Diagnosis Date Noted   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 02/08/2023   Depression with anxiety 01/11/2023   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   Coagulation defect, unspecified (HCC) 11/02/2022   Amnestic MCI (mild cognitive impairment with memory loss) 11/02/2022   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) 11/02/2022   Abnormal finding on MRI of brain 05/30/2022   Aneurysm of ascending aorta without rupture (HCC) 02/28/2022   Anemia due to acquired thiamine deficiency 12/01/2021   Seasonal allergic rhinitis due to pollen 11/08/2021   Family history of dissecting aortic aneurysm 11/08/2021   Contrast media allergy 11/08/2021   Avascular necrosis of bone of right hip (HCC) 11/02/2021   Avascular necrosis of bone of left hip (HCC) 11/02/2021   Primary hypertension 05/17/2021   Encounter for general adult medical examination with abnormal findings 05/17/2021   Posttraumatic stress disorder 06/05/2018   Nightmares associated with chronic post-traumatic stress disorder 09/27/2017   Brain tumor, glioma (HCC) 09/27/2017   Severe episode of recurrent major depressive disorder, without psychotic features (HCC)  Major depressive disorder, recurrent episode (HCC) 08/17/2016   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    ONSET DATE: 12/29/22 DOS craniotomy and tumor resection   REFERRING DIAG: C71.2 (ICD-10-CM) - Anaplastic oligodendroglioma of temporal lobe (HCC) I63.112 (ICD-10-CM) - 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  THERAPY DIAG:  Hemiplegia and hemiparesis following cerebral infarction affecting left non-dominant side (HCC)  Other lack of coordination  Muscle weakness (generalized)  Attention and  concentration deficit  Other disturbances of skin sensation  Rationale for Evaluation and Treatment: Rehabilitation  PERTINENT HISTORY: 47 year old male with history of AVN of b-hips s/p R-THR, HTN, PTSD, depression w/anxiety, ascending aortic aneurysm, 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 Adline Peals with 70% removal of tumor. Metronomic Temodar initiated. He completed standard radiation with concurrent temozolomide and Avastin in 2013. MRI on 11/09/2022 suspicious for progression and repeat MRI done 4 weeks later found to have FLAIR lesion. He underwent repeat right temporal craniotomy for resection of tumor on 12/29/2022 by Dr. Lavonna Monarch at [facility]. This was complicated by bleeding from a branch of the MCA and post-op motor deficits of left side and left facial droop. Follow up MRI showed subjacent extra axial hemorrhage with layering blood products in right temporal lobe resection cavity with small volume SAH and trace cytotoxic edema in resection cavity.  "This (point to L) arm is a big challenge.  I will tell it to do something and it won't." Reports decreased sensation, limited FMC, limited ability to grade pressure.  Spouse reports that when attempting to complete bimanual tasks, LUE may start task with setup but will not complete task as RUE.  PRECAUTIONS: no lifting or strenuous exercise until cleared by MD  WEIGHT BEARING RESTRICTIONS: No   SUBJECTIVE:   SUBJECTIVE STATEMENT: Pt reports feeling more clumsy today, closing the door on his fingers.  Pt reports when leaning against counter while washing up at sink that his hands slid on counter top and he hit his elbow.  Pt accompanied by: self, mother  PAIN:  Are you having pain? Yes: NPRS scale: 0/10 Pain location: Lt hand feels cold and mild nerve pain  FALLS: Has patient fallen in last 6 months? No  LIVING ENVIRONMENT: Lives with: lives with  their family (lives with spouse, 46 yo son and 16 yo daughter) Lives in: House/apartment Stairs: Yes: External: 3 steps; FIL can built hand rails Has following equipment at home: Single point cane, Environmental consultant - 2 wheeled, Marine scientist  PLOF: Independent, Independent with basic ADLs, and Vocation/Vocational requirements: [employer]  - unload trucks, break down pallets, Conservation officer, nature  PATIENT GOALS: to be able to play guitar again, not drop things   OBJECTIVE: (All objective assessments below are from initial evaluation on: 01/26/23 unless otherwise specified.)   HAND DOMINANCE: Right  ADLs: Transfers/ambulation related to ADLs: Mod I without AD Eating: would need assist to cut meat, knocks items off table with L hand (use of 2 hands to stabilize and scoop) Grooming: utilizing LUE as gross assist to hold items but not utilizing during task UB Dressing: increased effort LB Dressing: increased effort, attempting to utilize LUE however it will frequently drop things. Difficulty with tying shoes/draw string and buttoning  Toileting: increased time/effort when tearing toilet paper Bathing: difficulty with use of LUE when washing R side of body Tub Shower transfers: Mod I stepping over tub ledge Equipment: Transfer tub bench  IADLs: Light  housekeeping: attempted to feed dogs and dropped bowl from L hand Meal Prep: not currently cooking Community mobility: not cleared to drive Medication management: pt has a system and is able to remember to take as prescribed, difficulty with opening pill bottles  MOBILITY STATUS: Independent  POSTURE COMMENTS:  No Significant postural limitations  ACTIVITY TOLERANCE: Activity tolerance: WFL for tasks assessed  FUNCTIONAL OUTCOME MEASURES: Upper Extremity Functional Scale (UEFS): 18/80 or 22% functional (rated sole use of Lt arm)  UPPER EXTREMITY ROM:  RUE: WFL; LUE: Has active movement - can reach overhead and internal rotation with increased time/effort  - difficulty sustaining, and increased compensations noted with fatigue   UPPER EXTREMITY MMT:   grossly 4+ to 5/5 bilaterally  HAND FUNCTION: 02/01/23: Grip: Rt: 125#, Lt: 53#  COORDINATION: Box and Blocks:  Right 42 blocks, Left 12 blocks 02/14/23: L: 20 blocks  SENSATION: 02/01/23: OT tries Semmes-Weinstein Testing and he cannot perceive 6.65 (largest) most of the time globally in Lt hand, and so had diminished or no protective sensation and was cautioned about that.   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  Pt demonstrating good carryover of each exercise. Box and blocks: L: 20 blocks Coordination: w/ LUE including: attempts at rotating 2 small balls in hand, picking up various small objects and trying to see how many pt is able to hold in-hand without drops, picking up a small object and translating palm-to-fingertips, picking up 1" blocks progressing to checkers and stacking them, and picking up 5 legos and then checkers 1 at a time and translating palm to fingertips to place in a cup.  Pt initially with significant  difficulty with rotating 2 balls in hand largely due to impaired sensation.  Transitioned to picking up small legos and holding in hand with pt able to hold all 5 without dropping, OT then challenged pt to translate one lego at a time to finger tips with pt dropping 2 but able to complete with reset.  OT educated on progressive challenge to smaller items as able.  Pt attempting with coins but too difficult, but able to complete with checker pieces.      02/08/23: OT starts with a review of nerve glides for the radial and median nerves.  He is shown some compensation for shoulder tightness during these.  He states feeling a good tension through his arms but no pain.  He has some tightness through his wrist and forearm so he is reeducated on prayer stretches as well as doing some forearm  supination stretches.  Next we move into strengthening both wrist and forearm using "hammer" exercises for the forearm and functional activities for the hand and wrist focused on proprioception, awareness by carefully grasping small objects without knocking them over turning them with his wrist and forearm to drop them into a container.  We also work on pressure through his fingertips by "popping" a tubular tool across the table to each other again working on proprioception.  We briefly review developmental postures on the plinth and for continued shoulder and radial nerve pathway tightness, he is educated on shoulder internal and external rotation stretches to be done either seated at the table or in side-lying.  He states understanding all of these, has some extra little tingling and sensation through his arm afterwards which is normal after a "workout."  He was given handwritten sheet of recommendations and reminders.   02/03/23 NMR: engaged in prone developmental postures with weightbearing and prone on elbows with weight shifts and scapular prone retraction.  He is even able to push up from WB through elbows and knees to onto  hands and knees to complete alternating forward reach.  Pt with some L wrist instability with WB only through LUE, therefore OT providing min-mod support at L elbow and wrist to facilitate increased positioning. Engaged in weight shifting side to side and forward/backward both in WB on elbows and then weight bearing through hands. GMC: engaged in picking up and placing large jacks and matching to colored cones with focus on functional reach, grasp/release, progressing to pouring movement with focus on pronation and motor control.  OT providing cues to visually attend to LUE during functional reach to compensate for impaired sensation.  Pt knocking over cones x2 due to decreased visual attention and motor control.  Pt demonstrating difficulty with release of items from hand during task, however observed pt dropping papers x2 when gathering items to leave session. UE ROM: OT facilitated increased shoulder and wrist ROM.  OT providing min tactile cues for quality of movement with scapular retraction.  OT providing demonstration and verbal cues for radial and median nerve glides with focus on shoulder and wrist movements.  OT educating on use of vision to attend to quality of movement as pt with decreased abduction and supination with median nerve glide.  OT instructed on prayer stretch for additional wrist extension stretch.  Pt familiar with scapular retraction and prayer stretches from hospital.     PATIENT EDUCATION: Education details: Educated on role and purpose of OT as well as potential interventions and goals for therapy based on initial evaluation findings. Person educated: Patient and Spouse Education method: Explanation, Demonstration, and Handouts Education comprehension: verbalized understanding and needs further education  HOME EXERCISE PROGRAM: Developmental Motor Sequence (Rood) Do each 2-3 x day, don't hold breath, slowly, 5-10x each Crunches with arms and legs (Flexor-withdrawl) Roll  over (toward unaffected side)  "Superman" extension exercises (Pivot prone)       4. Push up/down and Rock side/side on elbows       5. Push up to get to hands and knees   Access Code: [email or web address] URL: [email or web address] Date: 02/03/2023 Prepared by: [facility] - [facility]  Exercises - Seated Scapular Retraction  - 2-3 x daily - 10 reps - 10 sec hold - Standing Radial Nerve Glide  - 2-3 x daily - 10 reps - Standing Median Nerve Glide  - 2-3 x daily - 10 reps - Wrist Prayer Stretch  -  2-3 x daily - 10 reps - 10 sec hold   GOALS: Goals reviewed with patient? Yes  SHORT TERM GOALS: Target date: 02/24/23  Pt will be independent in HEP for LUE coordination and ROM. Baseline: Goal status: IN PROGRESS  2.  Pt will verbalize understanding of task modifications and/or potential AE needs to increase ease, safety, and independence w/ ADLs. Baseline:  Goal status: IN PROGRESS  3.  Patient will demonstrate improved awareness of visual management strategies for L inattention. Baseline:  Goal status: IN PROGRESS  4.  Pt will demonstrate improved UE functional use for ADLs as evidenced by increasing box/ blocks score by 4 blocks with LUE Baseline: Box and Blocks:  Right 42 blocks, Left 12 blocks Goal status: MET - L: 20 blocks on 02/14/23   LONG TERM GOALS: Target date: 03/24/23  Pt will demonstrate improved UE functional use for ADLs as evidenced by increasing box/ blocks score by 8 blocks with LUE Baseline: Box and Blocks:  Right 42 blocks, Left 12 blocks Goal status: IN PROGRESS  2.  Pt will demonstrate improved fine motor coordination for ADLs as evidenced by being able to complete 9 hole peg test with LUE in 2 min time limit. Baseline:  Goal status: IN PROGRESS  3.  Pt will demonstrate improved awareness of safety considerations and AE needs to ensure safety d/t decreased sensation Baseline:  Goal status: IN PROGRESS  4.  Pt will  demonstrate improved attention to LUE to allow for sustained grasp to transport household items 25' to increase engagement in IADLs. Baseline:  Goal status: IN PROGRESS  5.  Pt will demonstrate improved functional use of LUE as evidenced by improved score by TBD on UEFS. Baseline: TBD Goal status: IN PROGRESS   ASSESSMENT:  CLINICAL IMPRESSION: Pt demonstrating increased distractibility this session, frequently looking out window and changing positions in treatment room.  Pt reports wife and children are out of town and that he is with his mother as he was not cleared to go out of town, unsure if that is impacting his attention/routine.  Pt with good carryover of exercises, therefore initiated coordination activities to complete at home.  Reiterated engaging in leisure tasks such as games and crafts with his children to continue to address coordination and attention.   PLAN:  OT FREQUENCY: 2x/week  OT DURATION: 8 weeks  PLANNED INTERVENTIONS: self care/ADL training, therapeutic exercise, therapeutic activity, neuromuscular re-education, manual therapy, passive range of motion, functional mobility training, splinting, electrical stimulation, ultrasound, compression bandaging, moist heat, cryotherapy, patient/family education, cognitive remediation/compensation, visual/perceptual remediation/compensation, psychosocial skills training, energy conservation, coping strategies training, and DME and/or AE instructions  CONSULTED AND AGREED WITH PLAN OF CARE: Patient and family member/caregiver  PLAN FOR NEXT SESSION:  Feel free to check shoulder stretches and internal and external rotation as well as forearm supination stretches.  continue on with functional activities and managing tightness and nerve issues.  Continue education on safety due to L inattention and decreased attention to tasks which impact driving.   Rosalio Loud, OTR/L 02/14/2023, 3:45 PM

## 2023-02-15 ENCOUNTER — Ambulatory Visit
Admission: RE | Admit: 2023-02-15 | Discharge: 2023-02-15 | Disposition: A | Discharge status: Home / Self Care | Payer: BC Managed Care – PPO | Source: Ambulatory Visit | Attending: Physician Assistant | Admitting: Physician Assistant

## 2023-02-15 ENCOUNTER — Encounter: Payer: Self-pay | Admitting: Internal Medicine

## 2023-02-15 ENCOUNTER — Encounter: Payer: Self-pay | Admitting: Physical Medicine and Rehabilitation

## 2023-02-15 ENCOUNTER — Encounter
Payer: BC Managed Care – PPO | Attending: Physical Medicine and Rehabilitation | Admitting: Physical Medicine and Rehabilitation

## 2023-02-15 ENCOUNTER — Ambulatory Visit: Payer: BC Managed Care – PPO

## 2023-02-15 ENCOUNTER — Other Ambulatory Visit: Payer: Self-pay | Admitting: Internal Medicine

## 2023-02-15 VITALS — BP 118/82 | HR 67 | Ht 74.0 in | Wt 215.4 lb

## 2023-02-15 DIAGNOSIS — C712 Malignant neoplasm of temporal lobe: Secondary | ICD-10-CM

## 2023-02-15 DIAGNOSIS — G9389 Other specified disorders of brain: Secondary | ICD-10-CM | POA: Diagnosis not present

## 2023-02-15 DIAGNOSIS — C719 Malignant neoplasm of brain, unspecified: Secondary | ICD-10-CM

## 2023-02-15 DIAGNOSIS — I10 Essential (primary) hypertension: Secondary | ICD-10-CM

## 2023-02-15 DIAGNOSIS — G8194 Hemiplegia, unspecified affecting left nondominant side: Secondary | ICD-10-CM

## 2023-02-15 DIAGNOSIS — R29818 Other symptoms and signs involving the nervous system: Secondary | ICD-10-CM

## 2023-02-15 DIAGNOSIS — F4310 Post-traumatic stress disorder, unspecified: Secondary | ICD-10-CM

## 2023-02-15 DIAGNOSIS — F515 Nightmare disorder: Secondary | ICD-10-CM

## 2023-02-15 MED ORDER — DOXAZOSIN MESYLATE 4 MG PO TABS: 4.0000 mg | ORAL_TABLET | Freq: Every day | ORAL | 0 refills | Status: DC

## 2023-02-15 MED ORDER — GADOPICLENOL 0.5 MMOL/ML IV SOLN
9.0000 mL | Freq: Once | INTRAVENOUS | Status: AC | PRN
  Administered 2023-02-15: 9 mL via INTRAVENOUS

## 2023-02-15 MED ORDER — TRAZODONE HCL 50 MG PO TABS: 50.0000 mg | ORAL_TABLET | Freq: Every day | ORAL | 5 refills | Status: DC

## 2023-02-15 MED ORDER — PREGABALIN 100 MG PO CAPS: 100.0000 mg | ORAL_CAPSULE | Freq: Three times a day (TID) | ORAL | 5 refills | Status: DC

## 2023-02-15 NOTE — Progress Notes (Signed)
Subjective:    Patient ID: Shawn York, male    DOB: [birth year], 47 y.o.   MRN: [id number]  HPI   Rajesh Wyss is a 46 y.o. year old male  who  has a past medical history of Anemia, Avascular necrosis (HCC), Brain cancer (HCC) (02/25/2011), Depression, Epistaxis, Headache, Hypertension, PTSD (post-traumatic stress disorder), and Seizures (HCC).   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 Lavonna Monarch at [facility], complicated by R MCA bleeding with L hemiparesis.   Interval Hx:  - Therapies: Therapies are going ok; states he still feels like his speaking is slurred despite SLP evaluation stating otherwise. No overt aspiration.   - Follow ups: Has repeat imaging scheduled today.   - DME: None  - Medications: No longer on Lyrica; on oxycarbazepine, buspar 30 mg BID, doxazosin, Xyzal, rexulti. No longer on laxative.   - Other concerns: "I want feeling back in my hands" He is having "seering" sensation in his L arm and torso; hypersensitivity. LLE sensation "has mostly come back".  Pain Inventory Average Pain 3 Pain Right Now 3 My pain is burning  LOCATION OF PAIN  left side  BOWEL Number of stools per week: n/a  BLADDER Normal  Mobility walk without assistance ability to climb steps?  yes do you drive?  no  Function disabled: date disabled 12/29/22  Neuro/Psych numbness depression anxiety  Prior Studies Any changes since last visit?  no  Physicians involved in your care Any changes since last visit?  no  Has seen Dr Dohmeier   Family History  Problem Relation Age of Onset   Cancer Other        brain cancer   Cancer Father        skin cancer   Cancer Maternal Grandmother 13       brain cancer   Alcohol abuse Brother    Alzheimer's disease Paternal Grandmother    Dementia Paternal Grandmother    Heart disease Paternal  Grandfather    Social History   Socioeconomic History   Marital status: Married    Spouse name: Not on file   Number of children: Not on file   Years of education: Not on file   Highest education level: Not on file  Occupational History   Not on file  Tobacco Use   Smoking status: Former    Types: Cigarettes    Quit date: 02/25/2011    Years since quitting: 11.9   Smokeless tobacco: Never  Vaping Use   Vaping Use: Never used  Substance and Sexual Activity   Alcohol use: Not Currently    Alcohol/week: 14.0 standard drinks of alcohol    Types: 14 Cans of beer per week    Comment: quit 11-08-2021   Drug use: No   Sexual activity: Yes    Comment: E-cigarette users  Other Topics Concern   Not on file  Social History Narrative   Not on file   Social Determinants of Health   Financial Resource Strain: Not on file  Food Insecurity: Not on file  Transportation Needs: Not on file  Physical Activity: Not on file  Stress: Not on file  Social Connections: Not on file   Past Surgical History:  Procedure Laterality Date   BASAL CELL CARCINOMA EXCISION  1995   BRAIN SURGERY  12/29/2022   CRANIOTOMY FOR TUMOR Right 02/25/2011   IR ANGIO EXTERNAL  CAROTID SEL EXT CAROTID BILAT MOD SED  11/16/2021   IR ANGIO INTRA EXTRACRAN SEL COM CAROTID INNOMINATE UNI R MOD SED  11/16/2021   IR ANGIO INTRA EXTRACRAN SEL INTERNAL CAROTID UNI L MOD SED  11/16/2021   IR ANGIO VERTEBRAL SEL VERTEBRAL UNI R MOD SED  11/16/2021   IR ANGIOGRAM FOLLOW UP STUDY  11/17/2021   IR NEURO EACH ADD'L AFTER BASIC UNI LEFT (MS)  11/17/2021   IR NEURO EACH ADD'L AFTER BASIC UNI RIGHT (MS)  11/17/2021   IR TRANSCATH/EMBOLIZ  11/16/2021   IR US GUIDE VASC ACCESS RIGHT  11/16/2021   RADIOLOGY WITH ANESTHESIA N/A 11/16/2021   Procedure: IR WITH ANESTHESIA;  Surgeon: Baldemar Lenis, MD;  Location: [facility] OR;  Service: Radiology;  Laterality: N/A;   TOTAL HIP ARTHROPLASTY Right 12/21/2021   Procedure:  RIGHT TOTAL HIP REPLACEMENT;  Surgeon: Tarry Kos, MD;  Location: [facility] OR;  Service: Orthopedics;  Laterality: Right;  3-C   WISDOM TOOTH EXTRACTION     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   Depression    Epistaxis    Headache    Hypertension    PTSD (post-traumatic stress disorder)    Seizures (HCC)    BP 118/82   Pulse 67   Ht 6\' 2"  (1.88 m)   Wt 215 lb 6.4 oz (97.7 kg)   SpO2 98%   BMI 27.66 kg/m   Opioid Risk Score:   Fall Risk Score:  `1  Depression screen [facility] 2/9     02/15/2023   11:52 AM 02/28/2022    3:04 PM 10/12/2021   11:04 AM 03/18/2021   11:24 AM 02/18/2021    2:24 PM 06/02/2020    9:27 AM  Depression screen PHQ 2/9  Decreased Interest 1 1 1 1 2  0  Down, Depressed, Hopeless 1 1 1 1 1  0  PHQ - 2 Score 2 2 2 2 3  0  Altered sleeping 1 3 1  0 0 0  Tired, decreased energy 1 1 1  0 3 0  Change in appetite 0 1 0 0 0 0  Feeling bad or failure about yourself  1 1 0 1 2 0  Trouble concentrating 2 2 0 0 1 0  Moving slowly or fidgety/restless 2 1 0 2 2 0  Suicidal thoughts 0 0 0 0 1 0  PHQ-9 Score 9 11 4 5 12  0  Difficult doing work/chores   Somewhat difficult Not difficult at all Somewhat difficult      Review of Systems  Constitutional: Negative.   HENT: Negative.    Eyes: Negative.   Respiratory: Negative.    Cardiovascular: Negative.   Gastrointestinal: Negative.   Endocrine: Negative.   Genitourinary: Negative.   Musculoskeletal: Negative.   Skin: Negative.   Allergic/Immunologic: Negative.   Neurological:  Positive for numbness.  Hematological: Negative.   Psychiatric/Behavioral:  Positive for dysphoric mood. The patient is nervous/anxious.   All other systems reviewed and are negative.      Objective:   Physical Exam   PE: Constitution: Appropriate appearance for age. No apparent distress. Resp: No respiratory distress. No  accessory muscle usage. on RA and CTAB Cardio:  Trace LUE peripheral edema, with erythema throughout, delayed capillary refill. RUE WNL.  Abdomen: Nondistended. Nontender.   Psych: Appropriate mood and affect. Neuro: AAOx4. No apparent cognitive deficits  Neurologic Exam:   Babinsky: flexor responses b/l.   Hoffmans: negative b/l Sensory exam: revealed normal sensation in all dermatomal regions in bilateral lower extremities and right upper extremity.  sensory deficit to mid-elbow and L V2-3  Motor exam: strength 5/5 throughout bilateral lower extremities, right upper extremity, and with exception of LUE strength 3/5 grip, FA; otherwise 4+/5 . No shoulder subluxation palpable.  Coordination: Fine motor coordination was mildly reduced on L compared to R.   Gait: normal     Assessment & Plan:  Jarmaine Ehrler is a 47 y.o. year old male  who  has a past medical history of Anemia, Avascular necrosis (HCC), Brain cancer (HCC) (02/25/2011), Depression, Epistaxis, Headache, Hypertension, PTSD (post-traumatic stress disorder), and Seizures (HCC).  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 Lavonna Monarch at [facility], complicated by R MCA bleeding with L hemiparesis.  Oligodendroglioma s/p resection (HCC) Left hemiparesis (HCC)  Today I refilled your Lyrica, Doxazosin, and Trazodone.  I will see you back in 3 months  L Hemisensory deficit Sensation recovered mostly in LLE, coming back in LUE; hopeful for continued improvement. 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  Resume Lyrica as above.   Continue therapies; I will reach out to SLP about lingual exercises to help with the sensation of slurred speech.  Discuss compression garments with OT for hypersensitivity with left arm.  Nightmares PTSD (post-traumatic stress disorder) -      Doxazosin Mesylate; Take 1 tablet (4 mg total) by mouth at bedtime.  Dispense: 90 tablet; Refill: 0  Further refills of Doxazosin should come from your PCP.  Other orders -     Pregabalin; Take 1 capsule (100 mg total) by mouth 3 (three) times daily.  Dispense: 90 capsule; Refill: 5 -     traZODone HCl; Take 1 tablet (50 mg total) by mouth at bedtime.  Dispense: 30 tablet; Refill: 5

## 2023-02-15 NOTE — Patient Instructions (Signed)
Today I refilled your Lyrica, Doxazosin, and Trazodone.  Further refills of Doxazosin should come from your PCP.  Continue therapies; I will reach out to SLP about lingual exercises to help with the sensation of slurred speech.  Discuss compression garments with OT for hypersensitivity with left arm  I will see you back in 3 months

## 2023-02-16 ENCOUNTER — Other Ambulatory Visit: Payer: Self-pay | Admitting: Neurology

## 2023-02-16 DIAGNOSIS — I63431 Cerebral infarction due to embolism of right posterior cerebral artery: Secondary | ICD-10-CM

## 2023-02-16 DIAGNOSIS — G3184 Mild cognitive impairment, so stated: Secondary | ICD-10-CM

## 2023-02-16 DIAGNOSIS — I63112 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: Secondary | ICD-10-CM

## 2023-02-16 DIAGNOSIS — I69014 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: Secondary | ICD-10-CM

## 2023-02-16 NOTE — Progress Notes (Signed)
[facility]-robotic based neuro rehab therapy system for use at home. My patient would functionally benefit from the active assistance and neuro muscular re-education to improve their active and passive range of motion, reduce tone and increase strength. Additionally, it would improve fine motor and gross motor to assist in eating, dressing, walking and other ADL's

## 2023-02-17 ENCOUNTER — Ambulatory Visit: Payer: BC Managed Care – PPO | Admitting: Occupational Therapy

## 2023-02-17 ENCOUNTER — Ambulatory Visit: Payer: BC Managed Care – PPO

## 2023-02-17 DIAGNOSIS — R1311 Dysphagia, oral phase: Secondary | ICD-10-CM | POA: Diagnosis not present

## 2023-02-17 DIAGNOSIS — I69354 Hemiplegia and hemiparesis following cerebral infarction affecting left non-dominant side: Secondary | ICD-10-CM

## 2023-02-17 DIAGNOSIS — R208 Other disturbances of skin sensation: Secondary | ICD-10-CM | POA: Diagnosis not present

## 2023-02-17 DIAGNOSIS — R41841 Cognitive communication deficit: Secondary | ICD-10-CM

## 2023-02-17 DIAGNOSIS — R278 Other lack of coordination: Secondary | ICD-10-CM

## 2023-02-17 DIAGNOSIS — M6281 Muscle weakness (generalized): Secondary | ICD-10-CM

## 2023-02-17 DIAGNOSIS — R41840 Attention and concentration deficit: Secondary | ICD-10-CM | POA: Diagnosis not present

## 2023-02-17 DIAGNOSIS — R4701 Aphasia: Secondary | ICD-10-CM | POA: Diagnosis not present

## 2023-02-17 NOTE — Patient Instructions (Signed)
   Today Shawn York and I talked about getting a 3-ring binder to be better organized with his therapy papers/medical papers. He could use a tab for each therapy, one for "to-do" list.  He could keep a calendar in there as well, with appointments written down on it, and put homework to do in a pocket inside the front cover.

## 2023-02-17 NOTE — Therapy (Signed)
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   Depression    Epistaxis    Headache    Hypertension    PTSD (post-traumatic stress disorder)    Seizures (HCC)    Past Surgical History:  Procedure Laterality Date   BASAL CELL CARCINOMA EXCISION  1995   BRAIN SURGERY  12/29/2022   CRANIOTOMY FOR TUMOR Right 02/25/2011   IR ANGIO EXTERNAL CAROTID SEL EXT CAROTID BILAT MOD SED  11/16/2021   IR ANGIO INTRA EXTRACRAN SEL COM CAROTID INNOMINATE UNI R MOD SED  11/16/2021   IR ANGIO INTRA EXTRACRAN SEL INTERNAL CAROTID UNI L MOD SED  11/16/2021   IR ANGIO VERTEBRAL SEL VERTEBRAL UNI R MOD SED  11/16/2021   IR ANGIOGRAM FOLLOW UP STUDY  11/17/2021   IR NEURO EACH ADD'L AFTER BASIC UNI LEFT (MS)  11/17/2021   IR NEURO EACH ADD'L AFTER BASIC UNI RIGHT (MS)  11/17/2021   IR TRANSCATH/EMBOLIZ  11/16/2021   IR US GUIDE VASC ACCESS RIGHT  11/16/2021   RADIOLOGY WITH ANESTHESIA N/A 11/16/2021   Procedure: IR WITH ANESTHESIA;  Surgeon: Shawn Lenis, MD;  Location: [facility] OR;  Service: Radiology;  Laterality: N/A;   TOTAL HIP ARTHROPLASTY Right 12/21/2021   Procedure: RIGHT TOTAL HIP REPLACEMENT;  Surgeon: Shawn Kos, MD;  Location: [facility] OR;  Service: Orthopedics;  Laterality: Right;  3-C   WISDOM TOOTH EXTRACTION     Patient Active Problem  List   Diagnosis Date Noted   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 02/08/2023   Depression with anxiety 01/11/2023   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   Coagulation defect, unspecified (HCC) 11/02/2022   Amnestic MCI (mild cognitive impairment with memory loss) 11/02/2022   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) 11/02/2022   Abnormal finding on MRI of brain 05/30/2022   Aneurysm of ascending aorta without rupture (HCC) 02/28/2022   Anemia due to acquired thiamine deficiency 12/01/2021   Seasonal allergic rhinitis due to pollen 11/08/2021   Family history of dissecting aortic aneurysm 11/08/2021   Contrast media allergy 11/08/2021   Avascular necrosis of bone of right hip (HCC) 11/02/2021   Avascular necrosis of bone of left hip (HCC) 11/02/2021   Primary hypertension 05/17/2021   Encounter for general adult medical examination with abnormal findings 05/17/2021   Posttraumatic stress disorder 06/05/2018   Nightmares associated with chronic post-traumatic stress disorder 09/27/2017   Brain tumor, glioma (HCC) 09/27/2017   Severe episode of recurrent major depressive disorder, without psychotic features (HCC)    Major depressive disorder, recurrent episode (HCC) 08/17/2016   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 I plopped it on top of here (yellow folder) when I was done with it but got here and I can't find it." Pt left his phone and wallet at home. Pt accompanied by: self  PERTINENT HISTORY:   Patient is a 47 year old male with history of AVN of b-hips s/p R-THR, HTN, PTSD, depression w/anxiety, ascending aortic aneurysm, anaplastic oligodendroglioma diagnosed in May of 2012 and underwent right temporal craniotomy by Shawn York with 70% removal of tumor. Metronomic Temodar initiated. He completed standard radiation with concurrent temozolomide and Avastin in 2013. MRI on 11/09/2022 suspicious for progression and repeat MRI done 4 weeks later found to have FLAIR lesion. He underwent repeat right temporal craniotomy for resection of tumor on 12/29/2022 by Dr. Lavonna York at [facility]. This was complicated by bleeding from a branch of the MCA and post-op motor deficits of left side and left facial droop. Follow up MRI showed subjacent extra axial hemorrhage with layering blood products in right temporal lobe resection cavity with small volume SAH and trace cytotoxic edema in resection cavity. Therapy evaluations completed with recommendations for CIR. Patient admitted 01/06/23. SLP consult today due to reports of cognitive changes and biting is tongue when eating.   Pt's wife sent a message to pt's therapists via MyChart on 01/25/23 which stated: Hello Dr. Sundra Aland, this is Shawn York's wife Shawn York and I wanted to write to you prior to our upcoming visit tomorrow to inform you of the current situation. Shawn York is having a hard time following through with simple tasks and it's causing a lot of stress in the home. He does not want to hear how important it is for him to do his PT from me, but he is not doing the exercises like he should. His depression is very severe and is aiding in the current situation at home. I wanted you to know ahead of time so I don't have to bring this up in front of him at our appointment.   PAIN:  Are you having pain? No  FALLS: Has patient fallen in last 6 months?  No  PATIENT GOALS: Return to work  OBJECTIVE:   DIAGNOSTIC FINDINGS:  MR BRAIN WO CONTRAST  December 12 2022 IMPRESSION: 1. Changes since 2020 MRI in the residual superior Right temporal gyrus highly suspicious for Tumor Recurrence, including new mass-like T2/FLAIR hyperintense gyral expansion and small new multifocal nodular enhancement there. No intracranial mass effect 2. Also new since 2020 but chronic appearing lacunar infarcts in the right basal ganglia. 3. No other acute intracranial abnormality.   MR BRAIN WO CONTRAST  Jan 17 2023 IMPRESSION: 1. 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 Previously seen nodular foci of contrast enhancement in the right temporal lobe are no longer visualized. 2. New T2/FLAIR hyperintense signal abnormality in the right corona radiata with associated curvilinear contrast enhancement in these regions. This could potentially represent a region of subacute infarct. Recommend attention on follow-up. 3. Small volume subarachnoid hemorrhage and postoperative subdural hematoma along the right cerebral convexity, as above. 4. 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 5. Dural enhancement and thickening along the left occipital convexity and likely the left temporal convexity, which may be due to CSF hypotension.  Discharge from SLP service:Jan 17, 2023 Patient has met 1 of 1 long term goals.  Patient to discharge at overall Modified Independent level.  Reasons goals not met: N/A  Clinical Impression/Discharge Summary: Patient has made functional gains and has met 1 of 1 LTGs this admission. Currently, patient requires extra processing time and is overall Mod I for recall of daily information with use of compensatory strategies. Patient continues to report impaired sensation of his oral-motor musculature resulting in him  biting his lips and tongue intermittently. However, his strength appears intact without evidence of dysarthria and with 100% intelligibility. Patient and family education is complete and patient will discharge home with assistance from family. Due to patient's high level of functioning and overall independence at baseline, f/u outpatient SLP services are recommended.  Care Partner:  Caregiver Able to Provide Assistance: Yes  Type of Caregiver Assistance: Cognitive;Physical Recommendation:  Outpatient SLP;24 hour supervision/assistance  Rationale for SLP Follow Up: Maximize cognitive function and independence;Reduce caregiver burden   PATIENT REPORTED OUTCOME MEASURES (PROM): Cognitive Function: was given to pt 02/01/23 and asked to return next session.   TODAY'S TREATMENT:                                                                                                                                         DATE:  02/17/23: Very flat affect today. See "S" statement re: homework, phone, wallet. SLP asked for PROM which pt found in his therapy folder, not started. SLP told pt to complete for next session and asked him when that was - and pt discovered at this moment he forgot phone at home. Given "S" and pt leaving his phone and wallet at home, SLP took the initial part of the session to assist pt in thinking of a better system to improve organization and planning. Pt unable to generate any feasible ideas so SLP suggested memory notebook/binder to organize his therapy information and achieve better organization in general (see pt instructions). SLP targeted organization and he separated all handouts with extra time and rare min A. He stuffed these back into his folder again and SLP req'd to provide cues for a more effective organizational strategy - for the time being, clip them together. Pt to hole punch handouts and put in 3-ring binder this weekend.  No dysarthria observed in today's session. Consider  providing lingual strength exercises as pt has complained of slurred speech and his tongue feeling "fat" in one previous session.  02/14/23: Pt brought homework folder today, however homework was unfinished as described in "s". Pt was unaware homework was unfinished.  He was 10 minutes late for initial session (OT), reported to SLP "My mom was pushing me out of the  house, I was taking a birdbath." When asked about this pt demonstrated reduced anticipatory awareness and stated he "always" had difficulty with time management at work as well - started projects too late or would take more time that he was allotted to complete.  Today SLP cued pt to look at task prior to completing it to familarize himself with the task proir to jumping in. In these detailed functional tasks pt completed with 100% accuracy x2, but third task with 60% success (3/5 correct). He did not double check answers, which was the initial "takeaway" SLP reiterated to pt about not completing his homework. When asked how he could improve his accuracy he told SLP he should have double checked his answers.  02/10/23: Completed HEP but forgot at home. Denied difficulty as primary SLP assisted with initial game plan for execution. Reduced attention noted in conversation with pt fixating on computer screen. Attention mildly improved following SLP commenting on reduced attention and minimizing visual distractions. Targeted attention to detail task with following specific instruction and decoding message. Pt completed tasks x2 with pt independently noting errors mid-task which skewed result. Pt able to correct with additional time and mod A. Targeted metacognitive skills, in which pt unable to ID solution to aid performance. SLP prompted marking letters to aid attention and processing, in which pt stated "but that is so slow". Re-educated importance of slow rate to aid processing speed and attention to increase performance. Similar result occurred for  second task. Pt verbally identified mistake but did not correct errors without prompt. Mild background noise (typing and voices in gym) was not impactful on performance. Pt reported tongue feels "fat" and reduced left facial sensation, which impacts intelligibility intermittently. Targeted velar sounds at word level for varied placements and increased number of syllables with rare errors noted. Benefited from slower rate for increased articulatory accuracy.   02/03/23: Pt did not return cognitive function PROM. SLP provided pt with targeted attention and organization tasks. Pt worked with detailed instrucutions and mathematical problems and did not double check his answers. Two errors were made which were not noticed by pt. SLP stressed to pt he will need to double check his answers now more than ever before. Pt said he would do so on remaining homework problems. SLP also told pt to download MyChart app to track appointments.   02/01/23: SLP completed the CLQT and discussed results with pt. Organization, planning, memory, and attention appear to be pt's deficit areas for SLP to target. Results are above.  01/26/23: SLP discussed some compensations for mealtime as pt stated he is having difficulty feeling food on the lt labial margin during meals - setting a mirror up in front of him at meals was suggested.  Standardized eval results, as much as pt has completed, were also reviewed.  PATIENT EDUCATION: Education details: see "today's treatment" Person educated: Patient Education method: Explanation, Demonstration, and Verbal cues Education comprehension: verbalized understanding and needs further education   GOALS: Goals reviewed with patient? No  SHORT TERM GOALS: Target date: 03/03/23  Pt will demo alternating attention with min-mod complex tasks involving language with compensations 90%, in 3 sessions Baseline: Goal status: IN PROGRESS  2.  Pt/family will report pt completes household tasks  involving language for 30 consecutive minutes with compensations if necessary, in 3sessions Baseline:  Goal status: IN PROGRESS  3.  Pt will demo use of cognitive (memory, planning/organization, attention, awareness) compensations when performing detailed language tasks in order to achieve 90% success, in  3 sessions Baseline:  Goal status: IN PROGRESS  4.  Pt will demo compensations with POs for reported labial leakage due to facial numbness on lt (including upper/lower lips)  Baseline:  Goal Status: IN PROGRESS   LONG TERM GOALS: Target date: 03/31/23  Pt will demo alternating attention with mod-max complex work-like tasks involving language (using interruptions) with compensations 100%, in 3 sessions Baseline:  Goal status: IN PROGRESS  2.  Pt/family will report pt completes household tasks involving language for 60 minutes with compensations if necessary, in 4 sessions Baseline:  Goal status: IN PROGRESS  3.  Pt will demo knowledge of need to employ compensations when performing detailed language tasks in order to achieve 100% success, in 4 sessions Baseline:  Goal status: IN PROGRESS  4.  Pt will tell SLP 3 compensations he may need to use at work to improve accuracy with recall or attention Baseline:  Goal status: IN PROGRESS  5.  Pt will score higher on cognitive PROM in the last 1-2 sessions than on initial administration  Baseline:  Goal Status: IN PROGRESS   ASSESSMENT:  CLINICAL IMPRESSION: Patient is a 47 y.o. male who was seen today therapy targeting planning, organization, and attention. See "today's treatment" for more details of today's session. Pt showing decr'd carryover of verbalized techniques for improved attention and attention to detail. Cecil's affect today was very flat and pt generally appears more disheveled this week than last week - family is away at the beach this week and pt is alone with periodic check-ins from his parents. Initiated suggestion for  pt to use a 3-ring binder to assist in organization and memory. Pt's cognition as it stands currently would be detrimental to his job security and pt would benefit from skilled ST to improve these skills.   OBJECTIVE IMPAIRMENTS: include attention, memory, awareness, and executive functioning. These impairments are limiting patient from return to work, managing medications, managing appointments, managing finances, household responsibilities, ADLs/IADLs, and effectively communicating at home and in community. Factors affecting potential to achieve goals and functional outcome are cooperation/participation level (according to wife, Shawn York). Patient will benefit from skilled SLP services to address above impairments and improve overall function.  REHAB POTENTIAL: Good  PLAN:  SLP FREQUENCY: 2x/week  SLP DURATION: 8 weeks (or 17 sessions)  PLANNED INTERVENTIONS: Language facilitation, Environmental controls, Cueing hierachy, Cognitive reorganization, Internal/external aids, Functional tasks, SLP instruction and feedback, Compensatory strategies, and Patient/family education    [facility], CCC-SLP 02/17/2023, 10:25 AM

## 2023-02-17 NOTE — Therapy (Addendum)
OUTPATIENT OCCUPATIONAL THERAPY NEURO TREATMENT NOTE  Patient Name: Shawn York MRN: [id number] DOB:07-09-1976, 47 y.o., male Today's Date: 02/17/2023  PCP: Etta Grandchild, MD REFERRING PROVIDER: Milinda Antis, PA-C  END OF SESSION:  OT End of Session - 02/17/23 1147     Visit Number 6    Number of Visits 17    Date for OT Re-Evaluation 03/24/23    Authorization Type [employer]    OT Start Time 1108    OT Stop Time 1148    OT Time Calculation (min) 40 min    Activity Tolerance No increased pain;Patient tolerated treatment well;Patient limited by fatigue;Patient limited by pain    Behavior During Therapy [facility] for tasks assessed/performed                 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   Depression    Epistaxis    Headache    Hypertension    PTSD (post-traumatic stress disorder)    Seizures (HCC)    Past Surgical History:  Procedure Laterality Date   BASAL CELL CARCINOMA EXCISION  1995   BRAIN SURGERY  12/29/2022   CRANIOTOMY FOR TUMOR Right 02/25/2011   IR ANGIO EXTERNAL CAROTID SEL EXT CAROTID BILAT MOD SED  11/16/2021   IR ANGIO INTRA EXTRACRAN SEL COM CAROTID INNOMINATE UNI R MOD SED  11/16/2021   IR ANGIO INTRA EXTRACRAN SEL INTERNAL CAROTID UNI L MOD SED  11/16/2021   IR ANGIO VERTEBRAL SEL VERTEBRAL UNI R MOD SED  11/16/2021   IR ANGIOGRAM FOLLOW UP STUDY  11/17/2021   IR NEURO EACH ADD'L AFTER BASIC UNI LEFT (MS)  11/17/2021   IR NEURO EACH ADD'L AFTER BASIC UNI RIGHT (MS)  11/17/2021   IR TRANSCATH/EMBOLIZ  11/16/2021   IR US GUIDE VASC ACCESS RIGHT  11/16/2021   RADIOLOGY WITH ANESTHESIA N/A 11/16/2021   Procedure: IR WITH ANESTHESIA;  Surgeon: Baldemar Lenis, MD;  Location: [facility] OR;  Service: Radiology;  Laterality: N/A;   TOTAL HIP ARTHROPLASTY Right 12/21/2021   Procedure: RIGHT TOTAL HIP REPLACEMENT;  Surgeon: Tarry Kos, MD;  Location: [facility] OR;  Service: Orthopedics;  Laterality: Right;  3-C   WISDOM TOOTH EXTRACTION     Patient Active Problem List   Diagnosis Date Noted   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 02/08/2023   Depression with anxiety 01/11/2023   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   Coagulation defect, unspecified (HCC) 11/02/2022   Amnestic MCI (mild cognitive impairment with memory loss) 11/02/2022   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) 11/02/2022   Abnormal finding on MRI of brain 05/30/2022   Aneurysm of ascending aorta without rupture (HCC) 02/28/2022   Anemia due to acquired thiamine deficiency 12/01/2021   Seasonal allergic rhinitis due to pollen 11/08/2021   Family history of dissecting aortic aneurysm 11/08/2021   Contrast media allergy 11/08/2021   Avascular necrosis of bone of right hip (HCC) 11/02/2021   Avascular necrosis of bone of left hip (HCC) 11/02/2021   Primary hypertension 05/17/2021   Encounter for general adult medical examination with abnormal findings 05/17/2021   Posttraumatic stress disorder 06/05/2018   Nightmares associated with chronic post-traumatic stress disorder 09/27/2017   Brain tumor, glioma (HCC) 09/27/2017   Severe episode of recurrent major depressive disorder, without psychotic features (HCC)    Major  depressive disorder, recurrent episode (HCC) 08/17/2016   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    ONSET DATE: 12/29/22 DOS craniotomy and tumor resection   REFERRING DIAG: C71.2 (ICD-10-CM) - Anaplastic oligodendroglioma of temporal lobe (HCC) I63.112 (ICD-10-CM) - 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  THERAPY DIAG:  Hemiplegia and hemiparesis following cerebral infarction affecting left non-dominant side (HCC)  Other lack of coordination  Muscle weakness (generalized)  Attention and concentration  deficit  Other disturbances of skin sensation  Rationale for Evaluation and Treatment: Rehabilitation  PERTINENT HISTORY: 47 year old male with history of AVN of b-hips s/p R-THR, HTN, PTSD, depression w/anxiety, ascending aortic aneurysm, 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 Adline Peals with 70% removal of tumor. Metronomic Temodar initiated. He completed standard radiation with concurrent temozolomide and Avastin in 2013. MRI on 11/09/2022 suspicious for progression and repeat MRI done 4 weeks later found to have FLAIR lesion. He underwent repeat right temporal craniotomy for resection of tumor on 12/29/2022 by Dr. Lavonna Monarch at [facility]. This was complicated by bleeding from a branch of the MCA and post-op motor deficits of left side and left facial droop. Follow up MRI showed subjacent extra axial hemorrhage with layering blood products in right temporal lobe resection cavity with small volume SAH and trace cytotoxic edema in resection cavity.  "This (point to L) arm is a big challenge.  I will tell it to do something and it won't." Reports decreased sensation, limited FMC, limited ability to grade pressure.  Spouse reports that when attempting to complete bimanual tasks, LUE may start task with setup but will not complete task as RUE.  PRECAUTIONS: no lifting or strenuous exercise until cleared by MD  WEIGHT BEARING RESTRICTIONS: No   SUBJECTIVE:   SUBJECTIVE STATEMENT: "I want to apologize for being tardy at last visit."  Pt accompanied by: self, mother  PAIN:  Are you having pain? Yes: NPRS scale: 0/10 Pain location: Lt hand feels cold and mild nerve pain  FALLS: Has patient fallen in last 6 months? No  LIVING ENVIRONMENT: Lives with: lives with their family (lives with spouse, 67 yo son and 61 yo daughter) Lives in: House/apartment Stairs: Yes: External: 3 steps; FIL can built hand rails Has  following equipment at home: Single point cane, Environmental consultant - 2 wheeled, Marine scientist  PLOF: Independent, Independent with basic ADLs, and Vocation/Vocational requirements: [employer]  - unload trucks, break down pallets, Conservation officer, nature  PATIENT GOALS: to be able to play guitar again, not drop things   OBJECTIVE: (All objective assessments below are from initial evaluation on: 01/26/23 unless otherwise specified.)   HAND DOMINANCE: Right  ADLs: Transfers/ambulation related to ADLs: Mod I without AD Eating: would need assist to cut meat, knocks items off table with L hand (use of 2 hands to stabilize and scoop) Grooming: utilizing LUE as gross assist to hold items but not utilizing during task UB Dressing: increased effort LB Dressing: increased effort, attempting to utilize LUE however it will frequently drop things. Difficulty with tying shoes/draw string and buttoning  Toileting: increased time/effort when tearing toilet paper Bathing: difficulty with use of LUE when washing R side of body Tub Shower transfers: Mod I stepping over tub ledge Equipment: Transfer tub bench  IADLs: Light housekeeping: attempted to feed dogs and dropped bowl from L hand Meal Prep: not currently cooking Community mobility: not cleared to drive Medication management: pt has a  system and is able to remember to take as prescribed, difficulty with opening pill bottles  MOBILITY STATUS: Independent  POSTURE COMMENTS:  No Significant postural limitations  ACTIVITY TOLERANCE: Activity tolerance: WFL for tasks assessed  FUNCTIONAL OUTCOME MEASURES: Upper Extremity Functional Scale (UEFS): 18/80 or 22% functional (rated sole use of Lt arm)  UPPER EXTREMITY ROM:  RUE: WFL; LUE: Has active movement - can reach overhead and internal rotation with increased time/effort - difficulty sustaining, and increased compensations noted with fatigue   UPPER EXTREMITY MMT:   grossly 4+ to 5/5 bilaterally  HAND  FUNCTION: 02/01/23: Grip: Rt: 125#, Lt: 53#  COORDINATION: Box and Blocks:  Right 42 blocks, Left 12 blocks 02/14/23: L: 20 blocks  SENSATION: 02/01/23: OT tries Semmes-Weinstein Testing and he cannot perceive 6.65 (largest) most of the time globally in Lt hand, and so had diminished or no protective sensation and was cautioned about that.   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  OT did provide pt with edema glove to attempt at home. Putty: pt reports difficulty when attempting to play guitar due to decreased strength, coordination, and motor planning.  Engaged in theraputty tasks with red theraputty with movements designed to aid in coordination as needed for leisure pursuits.  OT providing mod cues for claw fist and "duck mouth" due to decreased motor planning, pt benefiting from completion without putty and then addition of putty.  OT also instructed in holding putty in L hand and completing various pinch patterns to simulate guitar chords.  Pt with difficulty motor  planning.  Exercises - Full Fist  - 2-3 x daily - 5 reps - Thumb Opposition with Putty  - 2-3 x daily - 5 reps - "Duck Mouth" Strength  - 2-3 x daily - 5 reps - Seated Claw Fist with Putty  - 2-3 x daily - 5 reps   02/14/23 UE ROM: reviewed nerve glides for radial and median nerves, supination stretch, and prayer stretch.  Pt demonstrating good carryover of each exercise. Box and blocks: L: 20 blocks Coordination: w/ LUE including: attempts at rotating 2 small balls in hand, picking up various small objects and trying to see how many pt is able to hold in-hand without drops, picking up a small object and translating palm-to-fingertips, picking up 1" blocks progressing to checkers and stacking them, and picking up 5 legos and then checkers 1 at a time and translating palm to fingertips to place in a cup.  Pt initially with  significant difficulty with rotating 2 balls in hand largely due to impaired sensation.  Transitioned to picking up small legos and holding in hand with pt able to hold all 5 without dropping, OT then challenged pt to translate one lego at a time to finger tips with pt dropping 2 but able to complete with reset.  OT educated on progressive challenge to smaller items as able.  Pt attempting with coins but too difficult, but able to complete with checker pieces.      02/08/23: OT starts with a review of nerve glides for the radial and median nerves.  He is shown some compensation for shoulder tightness during these.  He states feeling a good tension through his arms but no pain.  He has some tightness through his wrist and forearm so he is reeducated on prayer stretches as well as doing some forearm supination stretches.  Next we move into strengthening both wrist and forearm using "hammer" exercises for the forearm and functional activities for the hand and wrist focused on proprioception, awareness by carefully grasping small objects without knocking them over turning them with his  wrist and forearm to drop them into a container.  We also work on pressure through his fingertips by "popping" a tubular tool across the table to each other again working on proprioception.  We briefly review developmental postures on the plinth and for continued shoulder and radial nerve pathway tightness, he is educated on shoulder internal and external rotation stretches to be done either seated at the table or in side-lying.  He states understanding all of these, has some extra little tingling and sensation through his arm afterwards which is normal after a "workout."  He was given handwritten sheet of recommendations and reminders.   PATIENT EDUCATION: Education details: ongoing condition specific education.  Educated on tone and exercise to continue to address tone and FMC. Person educated: Patient Education method: Explanation, Demonstration, and Handouts Education comprehension: verbalized understanding and needs further education  HOME EXERCISE PROGRAM: Developmental Motor Sequence (Rood) Do each 2-3 x day, don't hold breath, slowly, 5-10x each Crunches with arms and legs (Flexor-withdrawl) Roll over (toward unaffected side)  "Superman" extension exercises (Pivot prone)       4. Push up/down and Rock side/side on elbows       5. Push up to get to hands and knees   Access Code: [email or web address] URL: [email or web address] Date: 02/03/2023 Prepared by: [facility] - Outpatient  Rehab - [facility]  Access Code: [email or web address] URL: [email or web address] Date: 02/17/2023 Prepared by: [facility] - Outpatient  Rehab - [facility]   GOALS: Goals reviewed with patient? Yes  SHORT TERM GOALS: Target date: 02/24/23  Pt will be independent in HEP for LUE coordination and ROM. Baseline: Goal status: IN PROGRESS  2.  Pt will verbalize understanding of task modifications and/or potential AE needs to increase ease, safety, and independence w/ ADLs. Baseline:  Goal  status: IN PROGRESS  3.  Patient will demonstrate improved awareness of visual management strategies for L inattention. Baseline:  Goal status: IN PROGRESS  4.  Pt will demonstrate improved UE functional use for ADLs as evidenced by increasing box/ blocks score by 4 blocks with LUE Baseline: Box and Blocks:  Right 42 blocks, Left 12 blocks Goal status: MET - L: 20 blocks on 02/14/23   LONG TERM GOALS: Target date: 03/24/23  Pt will demonstrate improved UE functional use for ADLs as evidenced by increasing box/ blocks score by 8 blocks with LUE  Baseline: Box and Blocks:  Right 42 blocks, Left 12 blocks Goal status: IN PROGRESS  2.  Pt will demonstrate improved fine motor coordination for ADLs as evidenced by being able to complete 9 hole peg test with LUE in 2 min time limit. Baseline:  Goal status: IN PROGRESS  3.  Pt will demonstrate improved awareness of safety considerations and AE needs to ensure safety d/t decreased sensation Baseline:  Goal status: IN PROGRESS  4.  Pt will demonstrate improved attention to LUE to allow for sustained grasp to transport household items 25' to increase engagement in IADLs. Baseline:  Goal status: IN PROGRESS  5.  Pt will demonstrate improved functional use of LUE as evidenced by improved score by TBD on UEFS. Baseline: TBD Goal status: IN PROGRESS   ASSESSMENT:  CLINICAL IMPRESSION: Pt demonstrating increased distractibility this session, frequently looking out window and changing positions in treatment room.  Engaged in discussion about spasticity and impact on quality of movement.  Pt with difficulty with motor planning with use of theraputty, benefiting from initiation of movements without resistance and then incorporating resistance. Reiterated engaging in leisure tasks such as games and crafts with his children to continue to address coordination and attention.   PLAN:  OT FREQUENCY: 2x/week  OT DURATION: 8 weeks  PLANNED  INTERVENTIONS: self care/ADL training, therapeutic exercise, therapeutic activity, neuromuscular re-education, manual therapy, passive range of motion, functional mobility training, splinting, electrical stimulation, ultrasound, compression bandaging, moist heat, cryotherapy, patient/family education, cognitive remediation/compensation, visual/perceptual remediation/compensation, psychosocial skills training, energy conservation, coping strategies training, and DME and/or AE instructions  CONSULTED AND AGREED WITH PLAN OF CARE: Patient and family member/caregiver  PLAN FOR NEXT SESSION:  Check shoulder stretches and internal and external rotation as well as forearm supination stretches.  Assist pt in organizing exercises!  continue on with functional activities and managing tightness and nerve issues.  Continue education on safety due to L inattention and decreased attention to tasks which impact driving.  Explore various compression garments.   Rosalio Loud, OTR/L 02/17/2023, 11:47 AM

## 2023-02-19 DIAGNOSIS — M8909 Algoneurodystrophy, multiple sites: Secondary | ICD-10-CM | POA: Insufficient documentation

## 2023-02-19 DIAGNOSIS — G8194 Hemiplegia, unspecified affecting left nondominant side: Secondary | ICD-10-CM | POA: Insufficient documentation

## 2023-02-19 DIAGNOSIS — R29818 Other symptoms and signs involving the nervous system: Secondary | ICD-10-CM | POA: Insufficient documentation

## 2023-02-20 ENCOUNTER — Ambulatory Visit: Payer: BC Managed Care – PPO | Admitting: Speech Pathology

## 2023-02-20 ENCOUNTER — Encounter: Payer: Self-pay | Admitting: Surgery

## 2023-02-20 ENCOUNTER — Ambulatory Visit: Payer: BC Managed Care – PPO | Admitting: Occupational Therapy

## 2023-02-20 ENCOUNTER — Ambulatory Visit (INDEPENDENT_AMBULATORY_CARE_PROVIDER_SITE_OTHER): Payer: BC Managed Care – PPO | Admitting: Surgery

## 2023-02-20 VITALS — BP 111/78 | HR 68 | Resp 18 | Ht 74.0 in | Wt 218.0 lb

## 2023-02-20 DIAGNOSIS — R208 Other disturbances of skin sensation: Secondary | ICD-10-CM | POA: Diagnosis not present

## 2023-02-20 DIAGNOSIS — I7121 Aneurysm of the ascending aorta, without rupture: Secondary | ICD-10-CM | POA: Diagnosis not present

## 2023-02-20 DIAGNOSIS — R4701 Aphasia: Secondary | ICD-10-CM | POA: Diagnosis not present

## 2023-02-20 DIAGNOSIS — I69354 Hemiplegia and hemiparesis following cerebral infarction affecting left non-dominant side: Secondary | ICD-10-CM

## 2023-02-20 DIAGNOSIS — M6281 Muscle weakness (generalized): Secondary | ICD-10-CM | POA: Diagnosis not present

## 2023-02-20 DIAGNOSIS — R1311 Dysphagia, oral phase: Secondary | ICD-10-CM

## 2023-02-20 DIAGNOSIS — R41840 Attention and concentration deficit: Secondary | ICD-10-CM | POA: Diagnosis not present

## 2023-02-20 DIAGNOSIS — R278 Other lack of coordination: Secondary | ICD-10-CM | POA: Diagnosis not present

## 2023-02-20 DIAGNOSIS — R41841 Cognitive communication deficit: Secondary | ICD-10-CM | POA: Diagnosis not present

## 2023-02-20 NOTE — Progress Notes (Signed)
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  He has a history of a small aortic root aneurysm that was diagnosed on CT scan which was performed after his brother passed away from an aortic dissection about 2 years ago.  He denies any chest or back pain.  Current Outpatient Medications  Medication Sig Dispense Refill   acetaminophen (TYLENOL) 500 MG tablet Take 2 tablets (1,000 mg total) by mouth every 8 (eight) hours as needed. 30 tablet 0   b complex vitamins capsule Take 1 capsule by mouth daily.     brexpiprazole (REXULTI) 2 MG TABS tablet Take 1 tablet (2 mg total) by mouth daily. Take 0.5 mg daily for one week, then increase to 1 mg daily 30 tablet 1   busPIRone (BUSPAR) 30 MG tablet Take 1 tablet (30 mg total) by mouth 2 (two) times daily. 180 tablet 1   doxazosin (CARDURA) 4 MG tablet Take 1 tablet (4 mg total) by mouth at bedtime. 90 tablet 0   EPINEPHrine 0.3 mg/0.3 mL IJ SOAJ injection Inject 0.3 mg into the muscle once as needed (for a severe, allergic reaction).      levocetirizine (XYZAL) 5 MG tablet TAKE 1 TABLET BY MOUTH EVERY DAY IN THE EVENING 90 tablet 1   Oxcarbazepine (TRILEPTAL) 300 MG tablet Take 1 tablet (300 mg total) by mouth 2 (two) times daily. 60 tablet 0   pregabalin (LYRICA) 100 MG capsule Take 1 capsule (100 mg total) by mouth 3 (three) times daily. 90 capsule 5   traZODone (DESYREL) 50 MG tablet Take 1 tablet (50 mg total) by mouth at bedtime. 30 tablet 5   No current facility-administered medications for this visit.     Physical Exam: BP 111/78 (BP Location: Left Arm, Patient Position: Sitting)   Pulse 68   Resp 18   Ht 6\' 2"  (1.88 m)   Wt 218 lb (98.9 kg)   SpO2 98% Comment: Ra  BMI 27.99 kg/m  He looks  well. Cardiac exam shows a regular rate and rhythm with normal heart sounds.  There is no murmur. Lungs are clear  Diagnostic Tests:  ECHOCARDIOGRAM REPORT       Patient Name:   Shawn York   Date of Exam: 02/06/2023  Medical Rec #:  [id number]     Height:       73.0 in  Accession #:    [id number]    Weight:       210.3 lb  Date of Birth:  06-Feb-1976     BSA:          2.198 m  Patient Age:    46 years      BP:           122/62 mmHg  Patient Gender: M             HR:           60 bpm.  Exam Location:  [address]   Procedure: 2D Echo, Cardiac Doppler and Color Doppler   Indications:    I71.2 Ascending aortic aneurysm    History:        Patient has no prior history of Echocardiogram  examinations.  Risk Factors:Hypertension and Former Smoker. Aortic  dilatation.                 Anemia. Seizures.    Sonographer:    Cathie Beams RCS  Referring Phys: Maisie Fus   IMPRESSIONS     1. Left ventricular ejection fraction, by estimation, is 60 to 65%. The  left ventricle has normal function. The left ventricle has no regional  wall motion abnormalities. Left ventricular diastolic parameters were  normal.   2. Right ventricular systolic function is normal. The right ventricular  size is normal.   3. The mitral valve is normal in structure. Trivial mitral valve  regurgitation. No evidence of mitral stenosis.   4. The aortic valve is normal in structure. Aortic valve regurgitation is  not visualized. No aortic stenosis is present.   5. Aortic dilatation noted. There is borderline dilatation of the aortic  root, measuring 38 mm. There is mild dilatation of the ascending aorta,  measuring 40 mm.   6. The inferior vena cava is normal in size with greater than 50%  respiratory variability, suggesting right atrial pressure of 3 mmHg.   FINDINGS   Left Ventricle: Left ventricular ejection fraction, by estimation, is 60  to 65%. The left ventricle has normal  function. The left ventricle has no  regional wall motion abnormalities. The left ventricular internal cavity  size was normal in size. There is   no left ventricular hypertrophy. Left ventricular diastolic parameters  were normal.   Right Ventricle: The right ventricular size is normal. No increase in  right ventricular wall thickness. Right ventricular systolic function is  normal.   Left Atrium: Left atrial size was normal in size.   Right Atrium: Right atrial size was normal in size.   Pericardium: There is no evidence of pericardial effusion.   Mitral Valve: The mitral valve is normal in structure. Trivial mitral  valve regurgitation. No evidence of mitral valve stenosis.   Tricuspid Valve: The tricuspid valve is normal in structure. Tricuspid  valve regurgitation is not demonstrated. No evidence of tricuspid  stenosis.   Aortic Valve: The aortic valve is normal in structure. Aortic valve  regurgitation is not visualized. No aortic stenosis is present.   Pulmonic Valve: The pulmonic valve was not well visualized. Pulmonic valve  regurgitation is not visualized. No evidence of pulmonic stenosis.   Aorta: Aortic dilatation noted. There is borderline dilatation of the  aortic root, measuring 38 mm. There is mild dilatation of the ascending  aorta, measuring 40 mm.   Venous: The inferior vena cava is normal in size with greater than 50%  respiratory variability, suggesting right atrial pressure of 3 mmHg.   IAS/Shunts: No atrial level shunt detected by color flow Doppler.     LEFT VENTRICLE  PLAX 2D  LVIDd:         3.80 cm   Diastology  LVIDs:         2.40 cm   LV e' medial:    9.14 cm/s  LV PW:         1.20 cm   LV E/e' medial:  8.2  LV IVS:        1.30 cm   LV e' lateral:   10.60 cm/s  LVOT diam:     2.30 cm   LV E/e' lateral: 7.1  LV SV:         102  LV SV Index:   46  LVOT Area:  4.15 cm     RIGHT VENTRICLE  RV Basal diam:  2.90 cm  RV S prime:     12.30  cm/s  TAPSE (M-mode): 1.8 cm  RVSP:           22.0 mmHg   LEFT ATRIUM           Index        RIGHT ATRIUM           Index  LA diam:      2.90 cm 1.32 cm/m   RA Pressure: 8.00 mmHg  LA Vol (A2C): 40.9 ml 18.61 ml/m  RA Area:     11.70 cm  LA Vol (A4C): 23.9 ml 10.87 ml/m  RA Volume:   21.90 ml  9.96 ml/m   AORTIC VALVE  LVOT Vmax:   112.00 cm/s  LVOT Vmean:  72.300 cm/s  LVOT VTI:    0.246 m    AORTA  Ao Root diam: 3.80 cm  Ao Asc diam:  4.00 cm   MITRAL VALVE               TRICUSPID VALVE  MV Area (PHT): 3.08 cm    TR Peak grad:   14.0 mmHg  MV Decel Time: 246 msec    TR Vmax:        187.00 cm/s  MV E velocity: 74.90 cm/s  Estimated RAP:  8.00 mmHg  MV A velocity: 60.80 cm/s  RVSP:           22.0 mmHg  MV E/A ratio:  1.23                             SHUNTS                             Systemic VTI:  0.25 m                             Systemic Diam: 2.30 cm   Aditya Sabharwal  Electronically signed by Dorthula Nettles  Signature Date/Time: 02/06/2023/2:48:31 PM        Final      Narrative & Impression  CLINICAL DATA:  Aortic dilatation   EXAM: CT ANGIOGRAPHY CHEST WITH CONTRAST   TECHNIQUE: Multidetector CT imaging of the chest was performed using the standard protocol during bolus administration of intravenous contrast. Multiplanar CT image reconstructions and MIPs were obtained to evaluate the vascular anatomy.   RADIATION DOSE REDUCTION: This exam was performed according to the departmental dose-optimization program which includes automated exposure control, adjustment of the mA and/or kV according to patient size and/or use of iterative reconstruction technique.   CONTRAST:  80mL OMNIPAQUE IOHEXOL 350 MG/ML SOLN   COMPARISON:  CT chest 02/14/2022   FINDINGS: Cardiovascular: Negative for dissection. No definite central pulmonary embolus is seen. Normal cardiac size. No pericardial effusion.   The aorta has the following measurements:   Sinus of  Valsalva: 4.1 cm on coronal image 60 series 8. Previously this measured 4.1 cm.   Sinotubular junction: 3.4 cm, series 8, image 59,, previously 3.3 cm   Ascending aorta: 3.5 cm on axial series 5, image 69, previously 3.6 cm   Transverse arch diameter: 2.6 cm on sagittal series 9 image 105, previously 2.6 cm.   Proximal descending thoracic aorta: 2.4 cm on series 7, image 195 at  the level of pulmonary trunk, previously 2.4 cm   Mediastinum/Nodes: Midline trachea. No thyroid mass. No suspicious lymph nodes. Esophagus within normal limits   Lungs/Pleura: No acute airspace disease, pleural effusion or pneumothorax.   Upper Abdomen: No acute abnormality.   Musculoskeletal: No chest wall abnormality. No acute or significant osseous findings.   Review of the MIP images confirms the above findings.   IMPRESSION: 1. Negative for acute aortic dissection. 2. Maximum ascending aortic diameter of 4.1 cm, previously 4.1 cm. Recommend annual imaging followup by CTA or MRA. This recommendation follows 2010 ACCF/AHA/AATS/ACR/ASA/SCA/SCAI/SIR/STS/SVM Guidelines for the Diagnosis and Management of Patients with Thoracic Aortic Disease. Circulation. 2010; 121: Z610-R604. Aortic aneurysm NOS (ICD10-I71.9)   Aortic Atherosclerosis (ICD10-I70.0).Aortic aneurysm NOS (ICD10-I71.9).     Electronically Signed   By: Jasmine Pang M.D.   On: 02/14/2023 23:05     Impression:  This gentleman has a stable 4.1 cm aortic root and ascending aortic aneurysm which is unchanged compared to his prior CT in June 2023.  He does have a family history of aortic dissection.  He has a normal trileaflet aortic valve.  His aneurysm is well below the surgical threshold which I would consider to be 5 cm in a patient with a family history of aortic dissection.  I reviewed the CTA images with him and answered all of his questions.  I stressed the importance of good blood pressure control in preventing further  enlargement and acute aortic dissection.  I advised him against taking fluoroquinolone antibiotics that may be associated with enlargement of aortic aneurysms.  Plan:  I will plan to see him back in 1 year with a CTA of the chest for aortic surveillance.  I spent 15 minutes performing this established patient evaluation and > 50% of this time was spent face to face counseling and coordinating the care of this patient's aortic aneurysm.    Alleen Borne, MD [facility] [phone]

## 2023-02-20 NOTE — Patient Instructions (Signed)
Using your phone to keep you on track:  Prospective memory tasks:   Check ___________  Call _____________  Get _____________  Use location feature to remind you when something is location dependant    Reminder to get sunscreen next time you are at [employer]  Put in time off request next time you get to [facility]

## 2023-02-20 NOTE — Therapy (Signed)
OUTPATIENT OCCUPATIONAL THERAPY NEURO TREATMENT NOTE  Patient Name: Shawn York MRN: [id number] DOB:Apr 16, 1976, 47 y.o., male Today's Date: 02/20/2023  PCP: Etta Grandchild, MD REFERRING PROVIDER: Milinda Antis, PA-C  END OF SESSION:  OT End of Session - 02/20/23 1433     Visit Number 7    Number of Visits 17    Date for OT Re-Evaluation 03/24/23    Authorization Type [employer]    OT Start Time 1400    OT Stop Time 1445    OT Time Calculation (min) 45 min    Activity Tolerance No increased pain;Patient tolerated treatment well;Patient limited by fatigue;Patient limited by pain    Behavior During Therapy [facility] for tasks assessed/performed                  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   Depression    Epistaxis    Headache    Hypertension    PTSD (post-traumatic stress disorder)    Seizures (HCC)    Past Surgical History:  Procedure Laterality Date   BASAL CELL CARCINOMA EXCISION  1995   BRAIN SURGERY  12/29/2022   CRANIOTOMY FOR TUMOR Right 02/25/2011   IR ANGIO EXTERNAL CAROTID SEL EXT CAROTID BILAT MOD SED  11/16/2021   IR ANGIO INTRA EXTRACRAN SEL COM CAROTID INNOMINATE UNI R MOD SED  11/16/2021   IR ANGIO INTRA EXTRACRAN SEL INTERNAL CAROTID UNI L MOD SED  11/16/2021   IR ANGIO VERTEBRAL SEL VERTEBRAL UNI R MOD SED  11/16/2021   IR ANGIOGRAM FOLLOW UP STUDY  11/17/2021   IR NEURO EACH ADD'L AFTER BASIC UNI LEFT (MS)  11/17/2021   IR NEURO EACH ADD'L AFTER BASIC UNI RIGHT (MS)  11/17/2021   IR TRANSCATH/EMBOLIZ  11/16/2021   IR US GUIDE VASC ACCESS RIGHT  11/16/2021   RADIOLOGY WITH ANESTHESIA N/A 11/16/2021   Procedure: IR WITH ANESTHESIA;  Surgeon: Baldemar Lenis, MD;  Location: [facility] OR;  Service: Radiology;  Laterality: N/A;   TOTAL HIP ARTHROPLASTY Right 12/21/2021   Procedure: RIGHT TOTAL HIP REPLACEMENT;  Surgeon: Tarry Kos, MD;  Location: [facility] OR;  Service: Orthopedics;  Laterality: Right;  3-C   WISDOM TOOTH EXTRACTION     Patient Active Problem List   Diagnosis Date Noted   Left hemiparesis (HCC) 02/19/2023   Hemisensory deficit 02/19/2023   Shoulder-hand syndrome 02/19/2023   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 02/08/2023   Depression with anxiety 01/11/2023   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   Coagulation defect, unspecified (HCC) 11/02/2022   Amnestic MCI (mild cognitive impairment with memory loss) 11/02/2022   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) 11/02/2022   Abnormal finding on MRI of brain 05/30/2022   Aneurysm of ascending aorta without rupture (HCC) 02/28/2022   Anemia due to acquired thiamine deficiency 12/01/2021   Seasonal allergic rhinitis due to pollen 11/08/2021   Family history of dissecting aortic aneurysm 11/08/2021   Contrast media allergy 11/08/2021   Avascular necrosis of bone of right hip (HCC) 11/02/2021   Avascular necrosis of bone of left hip (HCC) 11/02/2021   Primary hypertension 05/17/2021   Encounter for general adult medical examination with abnormal findings 05/17/2021   PTSD (post-traumatic stress disorder) 06/05/2018   Nightmares 09/27/2017   Brain tumor, glioma (HCC) 09/27/2017   Severe episode of  recurrent major depressive disorder, without psychotic features (HCC)    Major depressive disorder, recurrent episode (HCC) 08/17/2016   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    ONSET DATE: 12/29/22 DOS craniotomy and tumor resection   REFERRING DIAG: C71.2 (ICD-10-CM) - Anaplastic oligodendroglioma of temporal lobe (HCC) I63.112 (ICD-10-CM) - 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  THERAPY DIAG:  Hemiplegia and hemiparesis following cerebral infarction affecting left non-dominant side (HCC)  Other lack of coordination  Muscle  weakness (generalized)  Other disturbances of skin sensation  Rationale for Evaluation and Treatment: Rehabilitation  PERTINENT HISTORY: 47 year old male with history of AVN of b-hips s/p R-THR, HTN, PTSD, depression w/anxiety, ascending aortic aneurysm, 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 Adline Peals with 70% removal of tumor. Metronomic Temodar initiated. He completed standard radiation with concurrent temozolomide and Avastin in 2013. MRI on 11/09/2022 suspicious for progression and repeat MRI done 4 weeks later found to have FLAIR lesion. He underwent repeat right temporal craniotomy for resection of tumor on 12/29/2022 by Dr. Lavonna Monarch at [facility]. This was complicated by bleeding from a branch of the MCA and post-op motor deficits of left side and left facial droop. Follow up MRI showed subjacent extra axial hemorrhage with layering blood products in right temporal lobe resection cavity with small volume SAH and trace cytotoxic edema in resection cavity.  "This (point to L) arm is a big challenge.  I will tell it to do something and it won't." Reports decreased sensation, limited FMC, limited ability to grade pressure.  Spouse reports that when attempting to complete bimanual tasks, LUE may start task with setup but will not complete task as RUE.  PRECAUTIONS: no lifting or strenuous exercise until cleared by MD  WEIGHT BEARING RESTRICTIONS: No   SUBJECTIVE:   SUBJECTIVE STATEMENT: Pt reports improved ability to maintain grasp on cup, but noted spilling to R/L due to decreased motor control of wrist with mobility.   Pt accompanied by: self, mother  PAIN:  Are you having pain? Yes: NPRS scale: 0/10 Pain location: Lt hand feels cold and mild nerve pain  FALLS: Has patient fallen in last 6 months? 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, break down pallets, Conservation officer, nature  PATIENT GOALS: to be able to play guitar again, not drop things   OBJECTIVE: (All objective assessments below are from initial evaluation on: 01/26/23 unless otherwise specified.)   HAND DOMINANCE: Right  ADLs: Transfers/ambulation related to ADLs: Mod I without AD Eating: would need assist to cut meat, knocks items off table with L hand (use of 2 hands to stabilize and scoop) Grooming: utilizing LUE as gross assist to hold items but not utilizing during task UB Dressing: increased effort LB Dressing: increased effort, attempting to utilize LUE however it will frequently drop things. Difficulty with tying shoes/draw string and buttoning  Toileting: increased time/effort when tearing toilet paper Bathing: difficulty with use of LUE when washing R side of body Tub Shower transfers: Mod I stepping over tub ledge Equipment: Transfer tub bench  IADLs: Light housekeeping: attempted to feed dogs and  dropped bowl from L hand Meal Prep: not currently cooking Community mobility: not cleared to drive Medication management: pt has a system and is able to remember to take as prescribed, difficulty with opening pill bottles  MOBILITY STATUS: Independent  POSTURE COMMENTS:  No Significant postural limitations  ACTIVITY TOLERANCE: Activity tolerance: WFL for tasks assessed  FUNCTIONAL OUTCOME MEASURES: Upper Extremity Functional Scale (UEFS): 18/80 or 22% functional (rated sole use of Lt arm)  UPPER EXTREMITY ROM:  RUE: WFL; LUE: Has active movement - can reach overhead and internal rotation with increased time/effort - difficulty sustaining, and increased  compensations noted with fatigue   UPPER EXTREMITY MMT:   grossly 4+ to 5/5 bilaterally  HAND FUNCTION: 02/01/23: Grip: Rt: 125#, Lt: 53#  COORDINATION: Box and Blocks:  Right 42 blocks, Left 12 blocks 02/14/23: L: 20 blocks  SENSATION: 02/01/23: OT tries Semmes-Weinstein Testing and he cannot perceive 6.65 (largest) most of the time globally in Lt hand, and so had diminished or no protective sensation and was cautioned about that.   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 with confrontation testing  Patient has difficulty with following activities due to following visual impairments: bumping into walls and furniture on L side of body  PERCEPTION: Impaired: Inattention/neglect: does not attend to left side of body   TODAY'S TREATMENT:                                                 02/20/23 NMR: engaged in tasks to incorporate and challenge proprioceptive input.  Engaged in tapping numbers on wall with dowel in L hand to facilitate changing directions, sustained grasp, and motor control.  Pt demonstrating increased effort when reaching outside BOS compared to crossing midline.  Min difficulty with locating numbers in upper L quadrant.  Engaged in WB through BUE in quadruped with forward/backward rocking to facilitate increased WB - pt reports pain in L wrist with increased WB.  Reviewed developmental motor sequence in regards to proprioceptive input and motor recovery. Therapeutic activities: engaged in cup stacking, rotation, and ball  toss with LUE and BUE with focus on proprioception and motor control.  Pt demonstrating improved grasp on cup with LUE, dropping x1.  Pt with ability to stack cups in pyramid formation without knocking over cups.  Progressed to ball toss with BUE, tossing ball with therapist with focus on bimanual coordination.  OT pointing out when RUE overtaking LUE with tossing.  Attempted ball toss within ipsilateral LUE with pt initially overthrowing but with improvements with repetition and cues to visually attend to L hand and attempt to grade pressure with tossing.   Sensation: pt with papers from MD about sensation input, asking questions for clarification.  Discussed proprioceptive input as well as tactile sensation and strategies to facilitate recovery.  Reiterated and reviewed massage, tapping, and vibration.  Pt with hypersensitivity in forearm and hyposensation in digits.   02/17/23 Edema management: engaged in discussion about various compression garments, with plan to attempt tubigrip at  future session vs pt purchasing commercially.  OT did provide pt with edema glove to attempt at home. Putty: pt reports difficulty when attempting to play guitar due to decreased strength, coordination, and motor planning.  Engaged in theraputty tasks with red theraputty with movements designed to aid in coordination as needed for leisure pursuits.  OT providing mod cues for claw fist and "duck mouth" due to decreased motor planning, pt benefiting from completion without putty and then addition of putty.  OT also instructed in holding putty in L hand and completing various pinch patterns to simulate guitar chords.  Pt with difficulty motor planning.  Exercises - Full Fist  - 2-3 x daily - 5 reps - Thumb Opposition with Putty  - 2-3 x daily - 5 reps - "Duck Mouth" Strength  - 2-3 x daily - 5 reps - Seated Claw Fist with Putty  - 2-3 x daily - 5 reps   02/14/23 UE ROM: reviewed nerve glides for radial and median nerves,  supination stretch, and prayer stretch.  Pt demonstrating good carryover of each exercise. Box and blocks: L: 20 blocks Coordination: w/ LUE including: attempts at rotating 2 small balls in hand, picking up various small objects and trying to see how many pt is able to hold in-hand without drops, picking up a small object and translating palm-to-fingertips, picking up 1" blocks progressing to checkers and stacking them, and picking up 5 legos and then checkers 1 at a time and translating palm to fingertips to place in a cup.  Pt initially with significant difficulty with rotating 2 balls in hand largely due to impaired sensation.  Transitioned to picking up small legos and holding in hand with pt able to hold all 5 without dropping, OT then challenged pt to translate one lego at a time to finger tips with pt dropping 2 but able to complete with reset.  OT educated on progressive challenge to smaller items as able.  Pt attempting with coins but too difficult, but able to complete with checker pieces.       PATIENT EDUCATION: Education details: ongoing condition specific education.  Educated on sensation and proprioceptive input. Person educated: Patient Education method: Explanation, Demonstration, and Handouts Education comprehension: verbalized understanding and needs further education  HOME EXERCISE PROGRAM: Developmental Motor Sequence (Rood) Do each 2-3 x day, don't hold breath, slowly, 5-10x each Crunches with arms and legs (Flexor-withdrawl) Roll over (toward unaffected side)  "Superman" extension exercises (Pivot prone)       4. Push up/down and Rock side/side on elbows       5. Push up to get to hands and knees   Access Code: [email or web address] URL: [email or web address] Date: 02/03/2023 Prepared by: [facility] - Outpatient  Rehab - [facility]  Access Code: [email or web address] URL: [email or web address] Date: 02/17/2023 Prepared by: [facility] - Outpatient  Rehab - [facility]   GOALS: Goals reviewed with patient? Yes  SHORT TERM GOALS: Target date: 02/24/23  Pt will be independent in HEP for LUE coordination and ROM. Baseline: Goal status: IN PROGRESS  2.  Pt will verbalize understanding of task modifications and/or potential AE needs to increase ease, safety, and independence w/ ADLs. Baseline:  Goal status: IN PROGRESS  3.  Patient will demonstrate improved awareness of visual management strategies for L inattention. Baseline:  Goal status: IN PROGRESS  4.  Pt will demonstrate improved UE functional use for ADLs as evidenced by increasing box/ blocks score by 4 blocks with LUE Baseline: Box  and Blocks:  Right 42 blocks, Left 12 blocks Goal status: MET - L: 20 blocks on 02/14/23   LONG TERM GOALS: Target date: 03/24/23  Pt will demonstrate improved UE functional use for ADLs as evidenced by increasing box/ blocks score by 8 blocks with LUE Baseline: Box and Blocks:  Right 42 blocks, Left 12 blocks Goal status: IN PROGRESS  2.  Pt will demonstrate improved fine motor coordination for ADLs as evidenced by being able to complete 9 hole peg test with LUE in 2 min time limit. Baseline:  Goal status: IN PROGRESS  3.  Pt will demonstrate improved awareness of safety considerations and AE needs to ensure safety d/t decreased sensation Baseline:  Goal status: IN PROGRESS  4.  Pt will demonstrate improved attention to LUE to allow for sustained grasp to transport household items 25' to increase engagement in IADLs. Baseline:  Goal status: IN PROGRESS  5.  Pt will demonstrate improved functional use of LUE as evidenced by improved score by TBD on UEFS. Baseline: 18/80 or 22% functional (rated sole use of Lt arm) Goal status: IN PROGRESS   ASSESSMENT:  CLINICAL IMPRESSION: Pt demonstrating increased sustained attention this session with increased focus on subject matter.  Engaged in discussion about sensation and proprioceptive input and  strategies to facilitate recovery.  Pt demonstrating improved motor control with cup stacking and rotation, increased difficulty with ipsilateral and bimanual ball tossing.     PLAN:  OT FREQUENCY: 2x/week  OT DURATION: 8 weeks  PLANNED INTERVENTIONS: self care/ADL training, therapeutic exercise, therapeutic activity, neuromuscular re-education, manual therapy, passive range of motion, functional mobility training, splinting, electrical stimulation, ultrasound, compression bandaging, moist heat, cryotherapy, patient/family education, cognitive remediation/compensation, visual/perceptual remediation/compensation, psychosocial skills training, energy conservation, coping strategies training, and DME and/or AE instructions  CONSULTED AND AGREED WITH PLAN OF CARE: Patient and family member/caregiver  PLAN FOR NEXT SESSION:  Check shoulder stretches and internal and external rotation as well as forearm supination stretches.  Assist pt in organizing exercises!  continue on with functional activities and managing tightness and nerve issues.  Continue education on safety due to L inattention and decreased attention to tasks which impact driving.  Explore various compression garments.   Rosalio Loud, OTR/L 02/20/2023, 2:33 PM

## 2023-02-20 NOTE — Therapy (Signed)
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   Depression    Epistaxis    Headache    Hypertension    PTSD (post-traumatic stress disorder)    Seizures (HCC)    Past Surgical History:  Procedure Laterality Date   BASAL CELL CARCINOMA EXCISION  1995   BRAIN SURGERY  12/29/2022   CRANIOTOMY FOR TUMOR Right 02/25/2011   IR ANGIO EXTERNAL CAROTID SEL EXT CAROTID BILAT MOD SED  11/16/2021   IR ANGIO INTRA EXTRACRAN SEL COM CAROTID INNOMINATE UNI R MOD SED  11/16/2021   IR ANGIO INTRA EXTRACRAN SEL INTERNAL CAROTID UNI L MOD SED  11/16/2021   IR ANGIO VERTEBRAL SEL VERTEBRAL UNI R MOD SED  11/16/2021   IR ANGIOGRAM FOLLOW UP STUDY  11/17/2021   IR NEURO EACH ADD'L AFTER BASIC UNI LEFT (MS)  11/17/2021   IR NEURO EACH ADD'L AFTER BASIC UNI RIGHT (MS)  11/17/2021   IR TRANSCATH/EMBOLIZ  11/16/2021   IR US GUIDE VASC ACCESS RIGHT  11/16/2021   RADIOLOGY WITH ANESTHESIA N/A 11/16/2021   Procedure: IR WITH ANESTHESIA;  Surgeon: Baldemar Lenis, MD;  Location: [facility] OR;  Service: Radiology;  Laterality: N/A;   TOTAL HIP ARTHROPLASTY Right 12/21/2021   Procedure: RIGHT TOTAL HIP REPLACEMENT;  Surgeon: Tarry Kos, MD;  Location: [facility] OR;  Service: Orthopedics;  Laterality: Right;  3-C   WISDOM TOOTH EXTRACTION     Patient Active Problem  List   Diagnosis Date Noted   Left hemiparesis (HCC) 02/19/2023   Hemisensory deficit 02/19/2023   Shoulder-hand syndrome 02/19/2023   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 02/08/2023   Depression with anxiety 01/11/2023   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   Coagulation defect, unspecified (HCC) 11/02/2022   Amnestic MCI (mild cognitive impairment with memory loss) 11/02/2022   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) 11/02/2022   Abnormal finding on MRI of brain 05/30/2022   Aneurysm of ascending aorta without rupture (HCC) 02/28/2022   Anemia due to acquired thiamine deficiency 12/01/2021   Seasonal allergic rhinitis due to pollen 11/08/2021   Family history of dissecting aortic aneurysm 11/08/2021   Contrast media allergy 11/08/2021   Avascular necrosis of bone of right hip (HCC) 11/02/2021   Avascular necrosis of bone of left hip (HCC) 11/02/2021   Primary hypertension 05/17/2021   Encounter for general adult medical examination with abnormal findings 05/17/2021   PTSD (post-traumatic stress disorder) 06/05/2018   Nightmares 09/27/2017   Brain tumor, glioma (HCC) 09/27/2017   Severe episode of recurrent major depressive disorder, without psychotic features (HCC)    Major depressive disorder, recurrent episode (HCC) 08/17/2016   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  Pt accompanied by: self  PERTINENT HISTORY:  Patient is a 47 year old  male with history of AVN of b-hips s/p R-THR, HTN, PTSD, depression w/anxiety, ascending aortic aneurysm, 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 Adline Peals with 70% removal of tumor. Metronomic Temodar initiated. He completed standard radiation with concurrent temozolomide and Avastin in 2013. MRI on 11/09/2022 suspicious for progression and repeat MRI done 4 weeks later found to have FLAIR lesion. He underwent repeat right temporal craniotomy for resection of tumor on 12/29/2022 by Dr. Lavonna Monarch at [facility]. This was complicated by bleeding from a branch of the MCA and post-op motor deficits of left side and left facial droop. Follow up MRI showed subjacent extra axial hemorrhage with layering blood products in right temporal lobe resection cavity with small volume SAH and trace cytotoxic edema in resection cavity. Therapy evaluations completed with recommendations for CIR. Patient admitted 01/06/23. SLP consult today due to reports of cognitive changes and biting is tongue when eating.   Pt's wife sent a message to pt's therapists via MyChart on 01/25/23 which stated: Hello Dr. Sundra Aland, this is Tavien's wife Shawn York and I wanted to write to you prior to our upcoming visit tomorrow to inform you of the current situation. Shawn York is having a hard time following through with simple tasks and it's causing a lot of stress in the home. He does not want to hear how important it is for him to do his PT from me, but he is not doing the exercises like he should. His depression is very severe and is aiding in the current situation at home. I wanted you to know ahead of time so I don't have to bring this up in front of him at our appointment.   PAIN:  Are you having pain? No  FALLS: Has patient fallen in last 6 months?  No  PATIENT GOALS: Return to work  OBJECTIVE:   DIAGNOSTIC FINDINGS:  MR BRAIN WO CONTRAST December 12 2022 IMPRESSION: 1. Changes since 2020 MRI in the residual superior Right temporal gyrus highly suspicious for Tumor Recurrence, including new mass-like T2/FLAIR hyperintense gyral expansion and small new multifocal nodular enhancement there. No intracranial mass effect 2. Also new since 2020 but chronic appearing lacunar infarcts in the right basal ganglia. 3. No other acute intracranial abnormality.   MR BRAIN WO CONTRAST  Jan 17 2023 IMPRESSION: 1. 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 Previously seen nodular foci of contrast enhancement in the right temporal lobe are no longer visualized. 2. New T2/FLAIR hyperintense signal abnormality in the right corona radiata with associated curvilinear contrast enhancement in these regions. This could potentially represent a region of subacute infarct. Recommend attention on follow-up. 3. Small volume subarachnoid hemorrhage and postoperative subdural hematoma along the right cerebral convexity, as above. 4. 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  5. Dural enhancement and thickening along the left occipital convexity and likely the left temporal convexity, which may be due to CSF hypotension.  Discharge from SLP service:Jan 17, 2023 Patient has met 1 of 1 long term goals.  Patient to discharge at overall Modified Independent level.  Reasons goals not met: N/A  Clinical Impression/Discharge Summary: Patient has made functional gains and has met 1 of 1 LTGs this admission. Currently, patient requires extra processing time and is overall Mod I for recall of daily information with use of compensatory strategies. Patient continues to report impaired sensation of his oral-motor musculature resulting in him biting  his lips and tongue intermittently. However, his strength appears intact without evidence of dysarthria and with 100% intelligibility. Patient and family education is complete and patient will discharge home with assistance from family. Due to patient's high level of functioning and overall independence at baseline, f/u outpatient SLP services are recommended.  Care Partner:  Caregiver Able to Provide Assistance: Yes  Type of Caregiver Assistance: Cognitive;Physical Recommendation:  Outpatient SLP;24 hour supervision/assistance  Rationale for SLP Follow Up: Maximize cognitive function and independence;Reduce caregiver burden   PATIENT REPORTED OUTCOME MEASURES (PROM): Cognitive Function: was given to pt 02/01/23 and asked to return next session.   TODAY'S TREATMENT:                                                                                                                                         DATE:  02/20/23: Pt indicating challenges with time management. SLP A pt in generating solutions for improved time management and task initiation using his phone as an external aid. Reminders and alarm apps demonstrated with features such as reoccurrence and location. Given example task, pt able to verbalize how he would use external cognitive aid to optimize in 4/4 trials. Led pt through convergent naming task, 75% accuracy, benefiting from cues to consider all details to form response. SLP facilitated generative language task with pt demonstrating need for visual reminders to aid in ability to complete accurately. With visual support and verbal cues to ongoing maintenance towards goal of completing 5, pt with 80% accuracy.   02/17/23: Very flat affect today. See "S" statement re: homework, phone, wallet. SLP asked for PROM which pt found in his therapy folder, not started. SLP told pt to complete for next session and asked him when that was - and pt discovered at this moment he forgot phone at home. Given  "S" and pt leaving his phone and wallet at home, SLP took the initial part of the session to assist pt in thinking of a better system to improve organization and planning. Pt unable to generate any feasible ideas so SLP suggested memory notebook/binder to organize his therapy information and achieve better organization in general (see pt instructions). SLP targeted organization and he separated all handouts with extra time and rare min A. He stuffed these  back into his folder again and SLP req'd to provide cues for a more effective organizational strategy - for the time being, clip them together. Pt to hole punch handouts and put in 3-ring binder this weekend.  No dysarthria observed in today's session. Consider providing lingual strength exercises as pt has complained of slurred speech and his tongue feeling "fat" in one previous session.  02/14/23: Pt brought homework folder today, however homework was unfinished as described in "s". Pt was unaware homework was unfinished.  He was 10 minutes late for initial session (OT), reported to SLP "My mom was pushing me out of the house, I was taking a birdbath." When asked about this pt demonstrated reduced anticipatory awareness and stated he "always" had difficulty with time management at work as well - started projects too late or would take more time that he was allotted to complete.  Today SLP cued pt to look at task prior to completing it to familarize himself with the task proir to jumping in. In these detailed functional tasks pt completed with 100% accuracy x2, but third task with 60% success (3/5 correct). He did not double check answers, which was the initial "takeaway" SLP reiterated to pt about not completing his homework. When asked how he could improve his accuracy he told SLP he should have double checked his answers.  02/10/23: Completed HEP but forgot at home. Denied difficulty as primary SLP assisted with initial game plan for execution. Reduced  attention noted in conversation with pt fixating on computer screen. Attention mildly improved following SLP commenting on reduced attention and minimizing visual distractions. Targeted attention to detail task with following specific instruction and decoding message. Pt completed tasks x2 with pt independently noting errors mid-task which skewed result. Pt able to correct with additional time and mod A. Targeted metacognitive skills, in which pt unable to ID solution to aid performance. SLP prompted marking letters to aid attention and processing, in which pt stated "but that is so slow". Re-educated importance of slow rate to aid processing speed and attention to increase performance. Similar result occurred for second task. Pt verbally identified mistake but did not correct errors without prompt. Mild background noise (typing and voices in gym) was not impactful on performance. Pt reported tongue feels "fat" and reduced left facial sensation, which impacts intelligibility intermittently. Targeted velar sounds at word level for varied placements and increased number of syllables with rare errors noted. Benefited from slower rate for increased articulatory accuracy.   02/03/23: Pt did not return cognitive function PROM. SLP provided pt with targeted attention and organization tasks. Pt worked with detailed instrucutions and mathematical problems and did not double check his answers. Two errors were made which were not noticed by pt. SLP stressed to pt he will need to double check his answers now more than ever before. Pt said he would do so on remaining homework problems. SLP also told pt to download MyChart app to track appointments.   02/01/23: SLP completed the CLQT and discussed results with pt. Organization, planning, memory, and attention appear to be pt's deficit areas for SLP to target. Results are above.  01/26/23: SLP discussed some compensations for mealtime as pt stated he is having difficulty feeling  food on the lt labial margin during meals - setting a mirror up in front of him at meals was suggested.  Standardized eval results, as much as pt has completed, were also reviewed.  PATIENT EDUCATION: Education details: see "today's treatment" Person educated: Patient  Education method: Explanation, Demonstration, and Verbal cues Education comprehension: verbalized understanding and needs further education   GOALS: Goals reviewed with patient? No  SHORT TERM GOALS: Target date: 03/03/23  Pt will demo alternating attention with min-mod complex tasks involving language with compensations 90%, in 3 sessions Baseline: Goal status: IN PROGRESS  2.  Pt/family will report pt completes household tasks involving language for 30 consecutive minutes with compensations if necessary, in 3sessions Baseline:  Goal status: IN PROGRESS  3.  Pt will demo use of cognitive (memory, planning/organization, attention, awareness) compensations when performing detailed language tasks in order to achieve 90% success, in 3 sessions Baseline:  Goal status: IN PROGRESS  4.  Pt will demo compensations with POs for reported labial leakage due to facial numbness on lt (including upper/lower lips)  Baseline:  Goal Status: IN PROGRESS   LONG TERM GOALS: Target date: 03/31/23  Pt will demo alternating attention with mod-max complex work-like tasks involving language (using interruptions) with compensations 100%, in 3 sessions Baseline:  Goal status: IN PROGRESS  2.  Pt/family will report pt completes household tasks involving language for 60 minutes with compensations if necessary, in 4 sessions Baseline:  Goal status: IN PROGRESS  3.  Pt will demo knowledge of need to employ compensations when performing detailed language tasks in order to achieve 100% success, in 4 sessions Baseline:  Goal status: IN PROGRESS  4.  Pt will tell SLP 3 compensations he may need to use at work to improve accuracy with recall or  attention Baseline:  Goal status: IN PROGRESS  5.  Pt will score higher on cognitive PROM in the last 1-2 sessions than on initial administration  Baseline:  Goal Status: IN PROGRESS   ASSESSMENT:  CLINICAL IMPRESSION: Patient is a 47 y.o. male who was seen today therapy targeting planning, organization, and attention. See "today's treatment" for more details of today's session. Pt showing decr'd carryover of verbalized techniques for improved attention and attention to detail. Braddock's affect today was very flat and pt generally appears more disheveled this week than last week - family is away at the beach this week and pt is alone with periodic check-ins from his parents. Initiated suggestion for pt to use a 3-ring binder to assist in organization and memory. Pt's cognition as it stands currently would be detrimental to his job security and pt would benefit from skilled ST to improve these skills.   OBJECTIVE IMPAIRMENTS: include attention, memory, awareness, and executive functioning. These impairments are limiting patient from return to work, managing medications, managing appointments, managing finances, household responsibilities, ADLs/IADLs, and effectively communicating at home and in community. Factors affecting potential to achieve goals and functional outcome are cooperation/participation level (according to wife, Shawn York). Patient will benefit from skilled SLP services to address above impairments and improve overall function.  REHAB POTENTIAL: Good  PLAN:  SLP FREQUENCY: 2x/week  SLP DURATION: 8 weeks (or 17 sessions)  PLANNED INTERVENTIONS: Language facilitation, Environmental controls, Cueing hierachy, Cognitive reorganization, Internal/external aids, Functional tasks, SLP instruction and feedback, Compensatory strategies, and Patient/family education    Maia Breslow, CCC-SLP 02/20/2023, 1:07 PM

## 2023-02-22 ENCOUNTER — Encounter: Payer: Self-pay | Admitting: Psychiatry

## 2023-02-22 ENCOUNTER — Ambulatory Visit: Payer: BC Managed Care – PPO | Admitting: Surgery

## 2023-02-22 ENCOUNTER — Ambulatory Visit (INDEPENDENT_AMBULATORY_CARE_PROVIDER_SITE_OTHER): Payer: BC Managed Care – PPO | Admitting: Psychiatry

## 2023-02-22 ENCOUNTER — Telehealth: Payer: Self-pay | Admitting: Psychiatry

## 2023-02-22 DIAGNOSIS — F4310 Post-traumatic stress disorder, unspecified: Secondary | ICD-10-CM

## 2023-02-22 DIAGNOSIS — F515 Nightmare disorder: Secondary | ICD-10-CM | POA: Diagnosis not present

## 2023-02-22 DIAGNOSIS — C712 Malignant neoplasm of temporal lobe: Secondary | ICD-10-CM | POA: Diagnosis not present

## 2023-02-22 DIAGNOSIS — F330 Major depressive disorder, recurrent, mild: Secondary | ICD-10-CM

## 2023-02-22 DIAGNOSIS — C719 Malignant neoplasm of brain, unspecified: Secondary | ICD-10-CM | POA: Diagnosis not present

## 2023-02-22 MED ORDER — BREXPIPRAZOLE 2 MG PO TABS
2.0000 mg | ORAL_TABLET | Freq: Every day | ORAL | 2 refills | Status: DC
Start: 2023-02-22 — End: 2023-03-23

## 2023-02-22 NOTE — Progress Notes (Signed)
Shawn York [id number] 1976-08-27 47 y.o.  Subjective:   Patient ID:  Shawn York is a 47 y.o. (DOB [birth year]) male.  Chief Complaint: No chief complaint on file.   HPI Equan Cogbill presents to the office today for follow-up of ***  He reports that in May he had an MRI and biopsy. He reports that he had a stroke and "woke up paralyzed on left side." He reports that he continues to have numbness in his left hand. He reports that he is going to [facility] today to learn the results of biopsy. He reports that he has some nightmares. He reports that nightmares are worse without Doxazosin.  He reports that depression has been worse. He reports that in the past he was able to "distract myself by doing things" and now "I feel useless." He reports, "I feel pretty anxious." He reports "the stroke has dialed back my jitters a little bit." He reports increased worry and rumination. Some increased PTSD symptoms. He notices some hyper-vigilance and concern about whether he would be able to defend himself. He reports that Trazodone is helpful for his insomnia. He slept about 10 hours last night. He reports that he has energy "but I'm not very motivated." He reports that he is motivated to work in therapy to improve use of left UE. He reports that his appetite is ok and eating "better quality foods." Denies SI.   Continues to see therapist.   He reports that he loses track of time. He reports that he often is "daydreaming."   Currently out of work.   Past Psychiatric Medication Trials: Trintellix Rexulti Trileptal Buspar Remeron Zoloft- Sexual side effects. "Took the edge off a little bit."  Prozac- Sexual side effects Viibryd- Severe GI side effects Hydroxyzine- Drowsiness Dayvigo-excessive somnolence Doxazosin-helpful for nightmares Hydroxyzine- had withdrawal   AIMS    Flowsheet Row Office Visit from 12/12/2022 in [facility]  AIMS Total Score 0      PHQ2-9     Flowsheet Row Office Visit from 02/15/2023 in [facility] Office Visit from 02/28/2022 in [facility] [place] Office Visit from 10/12/2021 in [facility] [place] Office Visit from 03/18/2021 in [facility] Office Visit from 02[facility]18/2021 in [facility]  PHQ-2 Total Score 2 2 2 2 3   PHQ-9 Total Score 9 11 4 5 12       Flowsheet Row Admission (Discharged) from 01/06/2023 in [facility] A Pre-Admission Testing 60 from 12/14/2021 in [facility] PREADMISSION TESTING ED from 11/21/2021 in [facility] Emergency Department at [facility]  C-SSRS RISK CATEGORY No Risk No Risk No Risk        Review of Systems:  Review of Systems  Musculoskeletal:  Negative for gait problem.  Neurological:  Positive for weakness and headaches.       Some spasms when trying to affected left side. Reports slurred speech at times. Reports difficulty moving his tongue and some facial numbness.  Psychiatric/Behavioral:         Please refer to HPI    Medications: I have reviewed the patient's current medications.  Current Outpatient Medications  Medication Sig Dispense Refill   acetaminophen (TYLENOL) 500 MG tablet Take 2 tablets (1,000 mg total) by mouth every 8 (eight) hours as needed. 30 tablet 0   b complex  vitamins capsule Take 1 capsule by mouth daily.     brexpiprazole (REXULTI) 2 MG TABS tablet Take 1 tablet (2 mg total) by mouth daily. Take 0.5 mg daily for one week, then increase to 1 mg daily 30 tablet 1   busPIRone (BUSPAR) 30 MG tablet Take 1 tablet (30 mg total) by mouth 2 (two) times daily. 180 tablet 1   doxazosin (CARDURA) 4 MG tablet Take 1 tablet (4 mg total) by mouth at bedtime. 90 tablet 0   EPINEPHrine 0.3 mg/0.3 mL IJ SOAJ injection Inject 0.3 mg into the muscle once as  needed (for a severe, allergic reaction).      levocetirizine (XYZAL) 5 MG tablet TAKE 1 TABLET BY MOUTH EVERY DAY IN THE EVENING 90 tablet 1   Oxcarbazepine (TRILEPTAL) 300 MG tablet Take 1 tablet (300 mg total) by mouth 2 (two) times daily. 60 tablet 0   pregabalin (LYRICA) 100 MG capsule Take 1 capsule (100 mg total) by mouth 3 (three) times daily. 90 capsule 5   traZODone (DESYREL) 50 MG tablet Take 1 tablet (50 mg total) by mouth at bedtime. 30 tablet 5   No current facility-administered medications for this visit.    Medication Side Effects: None  Allergies:  Allergies  Allergen Reactions   Iodinated Contrast Media Rash and Anaphylaxis   Penicillins Shortness Of Breath and Rash    Did it involve swelling of the face/tongue/throat, SOB, or low BP? Yes Did it involve sudden or severe rash/hives, skin peeling, or any reaction on the inside of your mouth or nose? No Did you need to seek medical attention at a hospital or doctor's office? No When did it last happen? Unk If all above answers are "NO", may proceed with cephalosporin use.     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   Please see review of systems for further details on the patient's review from today.   Objective:   Physical Exam:  There were no vitals taken for this visit.  Physical Exam  Lab Review:     Component Value Date/Time   NA 140 01/17/2023 0558   NA 141 06/21/2013 1505   K 4.0 01/17/2023 0558   K 4.2 06/21/2013 1505   CL 108 01/17/2023 0558   CO2 25  01/17/2023 0558   CO2 25 06/21/2013 1505   GLUCOSE 101 (H) 01/17/2023 0558   GLUCOSE 83 06/21/2013 1505   BUN 22 (H) 01/17/2023 0558   BUN 18.1 06/21/2013 1505   CREATININE 0.91 01/17/2023 0558   CREATININE 1.03 12/15/2021 1522   CREATININE 0.9 06/21/2013 1505   CALCIUM 8.6 (L) 01/17/2023 0558   CALCIUM 9.1 06/21/2013 1505   PROT 5.8 (L) 01/16/2023 0526   PROT 7.0 06/21/2013 1505   ALBUMIN 3.4 (L) 01/16/2023 0526   ALBUMIN 4.1 06/21/2013 1505   AST 22 01/16/2023 0526   AST 15 12/15/2021 1522   AST 15 06/21/2013 1505   ALT 40 01/16/2023 0526   ALT 19 12/15/2021 1522   ALT  13 06/21/2013 1505   ALKPHOS 44 01/16/2023 0526   ALKPHOS 59 06/21/2013 1505   BILITOT 0.1 (L) 01/16/2023 0526   BILITOT 0.3 12/15/2021 1522   BILITOT 0.33 06/21/2013 1505   GFRNONAA >60 01/17/2023 0558   GFRNONAA >60 12/15/2021 1522   GFRAA >60 04/30/2020 1533       Component Value Date/Time   WBC 4.1 01/16/2023 0526   RBC 3.44 (L) 01/16/2023 0526   HGB 11.7 (L) 01/16/2023 0526   HGB 11.6 (L) 12/15/2021 1522   HGB 13.3 06/21/2013 1505   HCT 34.0 (L) 01/16/2023 0526   HCT 38.7 06/21/2013 1505   PLT 159 01/16/2023 0526   PLT 145 (L) 12/15/2021 1522   PLT 154 06/21/2013 1505   MCV 98.8 01/16/2023 0526   MCV 99.0 (H) 06/21/2013 1505   MCH 34.0 01/16/2023 0526   MCHC 34.4 01/16/2023 0526   RDW 14.0 01/16/2023 0526   RDW 13.3 06/21/2013 1505   LYMPHSABS 2.0 01/16/2023 0526   LYMPHSABS 1.1 06/21/2013 1505   MONOABS 0.4 01/16/2023 0526   MONOABS 0.5 06/21/2013 1505   EOSABS 0.1 01/16/2023 0526   EOSABS 0.1 06/21/2013 1505   BASOSABS 0.0 01/16/2023 0526   BASOSABS 0.0 06/21/2013 1505    No results found for: "POCLITH", "LITHIUM"   No results found for: "PHENYTOIN", "PHENOBARB", "VALPROATE", "CBMZ"   .res Assessment: Plan:      There are no diagnoses linked to this encounter.   Please see After Visit Summary for patient specific instructions.  Future Appointments  Date Time Provider  Department Center  02/23/2023 11:00 AM Darrelyn Hillock [facility]  02/23/2023 11:45 AM Dillard Essex, OT [facility]  02/27/2023 11:45 AM Kathline Magic Brand Males, OT [facility]  02/27/2023 12:30 PM Barron Alvine, CCC-SLP [facility]  03/01/2023 11:45 AM Dillard Essex, OT [facility]  03/01/2023 12:30 PM Barron Alvine, CCC-SLP [facility]  03/06/2023 11:00 AM Barron Alvine, CCC-SLP [facility]  03/08/2023 11:00 AM Darrelyn Hillock [facility]  03/08/2023 11:45 AM Dillard Essex, OT [facility]  03/13/2023 11:00 AM Barron Alvine, CCC-SLP [facility]  03/13/2023 11:45 AM Dillard Essex, OT [facility]  03/15/2023 11:00 AM Barron Alvine, CCC-SLP [facility]  03/15/2023 11:45 AM Dillard Essex, OT [facility]  03/20/2023 11:45 AM Dillard Essex, OT [facility]  03/22/2023 11:00 AM Barron Alvine, CCC-SLP [facility]  03/22/2023 11:45 AM Dillard Essex, OT [facility]  05/29/2023 10:00 AM Angelina Sheriff, DO [facility]  06/21/2023  1:30 PM Dohmeier, Porfirio Mylar, MD [facility] None    No orders of the defined types were placed in this encounter.   -------------------------------

## 2023-02-22 NOTE — Telephone Encounter (Signed)
[facility] sent PA Request for Rexulti 2.0mg   , See CMM

## 2023-02-23 ENCOUNTER — Encounter: Payer: Self-pay | Admitting: Speech Pathology

## 2023-02-23 ENCOUNTER — Ambulatory Visit: Payer: BC Managed Care – PPO | Admitting: Speech Pathology

## 2023-02-23 ENCOUNTER — Ambulatory Visit: Payer: BC Managed Care – PPO | Admitting: Occupational Therapy

## 2023-02-23 DIAGNOSIS — R41840 Attention and concentration deficit: Secondary | ICD-10-CM | POA: Diagnosis not present

## 2023-02-23 DIAGNOSIS — M6281 Muscle weakness (generalized): Secondary | ICD-10-CM | POA: Diagnosis not present

## 2023-02-23 DIAGNOSIS — R41841 Cognitive communication deficit: Secondary | ICD-10-CM

## 2023-02-23 DIAGNOSIS — R1311 Dysphagia, oral phase: Secondary | ICD-10-CM | POA: Diagnosis not present

## 2023-02-23 DIAGNOSIS — R4701 Aphasia: Secondary | ICD-10-CM

## 2023-02-23 DIAGNOSIS — I69354 Hemiplegia and hemiparesis following cerebral infarction affecting left non-dominant side: Secondary | ICD-10-CM | POA: Diagnosis not present

## 2023-02-23 DIAGNOSIS — R208 Other disturbances of skin sensation: Secondary | ICD-10-CM | POA: Diagnosis not present

## 2023-02-23 DIAGNOSIS — R278 Other lack of coordination: Secondary | ICD-10-CM

## 2023-02-23 NOTE — Therapy (Signed)
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   Depression    Epistaxis    Headache    Hypertension    PTSD (post-traumatic stress disorder)    Seizures (HCC)    Past Surgical History:  Procedure Laterality Date   BASAL CELL CARCINOMA EXCISION  1995   BRAIN SURGERY  12/29/2022   CRANIOTOMY FOR TUMOR Right 02/25/2011   IR ANGIO EXTERNAL CAROTID SEL EXT CAROTID BILAT MOD SED  11/16/2021   IR ANGIO INTRA EXTRACRAN SEL COM CAROTID INNOMINATE UNI R MOD SED  11/16/2021   IR ANGIO INTRA EXTRACRAN SEL INTERNAL CAROTID UNI L MOD SED  11/16/2021   IR ANGIO VERTEBRAL SEL VERTEBRAL UNI R MOD SED  11/16/2021   IR ANGIOGRAM FOLLOW UP STUDY  11/17/2021   IR NEURO EACH ADD'L AFTER BASIC UNI LEFT (MS)  11/17/2021   IR NEURO EACH ADD'L AFTER BASIC UNI RIGHT (MS)  11/17/2021   IR TRANSCATH/EMBOLIZ  11/16/2021   IR US GUIDE VASC ACCESS RIGHT  11/16/2021   RADIOLOGY WITH ANESTHESIA N/A 11/16/2021   Procedure: IR WITH ANESTHESIA;  Surgeon: Baldemar Lenis, MD;  Location: [facility] OR;  Service: Radiology;  Laterality: N/A;   TOTAL HIP ARTHROPLASTY Right 12/21/2021   Procedure: RIGHT TOTAL HIP REPLACEMENT;  Surgeon: Tarry Kos, MD;  Location: [facility] OR;  Service: Orthopedics;  Laterality: Right;  3-C   WISDOM TOOTH EXTRACTION     Patient Active Problem List   Diagnosis Date Noted   Left hemiparesis (HCC) 02/19/2023   Hemisensory deficit 02/19/2023   Shoulder-hand syndrome 02/19/2023   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 02/08/2023   Depression with anxiety 01/11/2023    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   Coagulation defect, unspecified (HCC) 11/02/2022   Amnestic MCI (mild cognitive impairment with memory loss) 11/02/2022   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) 11/02/2022   Abnormal finding on MRI of brain 05/30/2022   Aneurysm of ascending aorta without rupture (HCC) 02/28/2022   Anemia due to acquired thiamine deficiency 12/01/2021   Seasonal allergic rhinitis due to pollen 11/08/2021   Family history of dissecting aortic aneurysm 11/08/2021   Contrast media allergy 11/08/2021   Avascular necrosis of bone of right hip (HCC) 11/02/2021   Avascular necrosis of bone of left hip (HCC) 11/02/2021   Primary hypertension 05/17/2021   Encounter for general adult medical examination with abnormal findings 05/17/2021   PTSD (post-traumatic stress disorder) 06/05/2018   Nightmares 09/27/2017   Brain tumor, glioma (HCC) 09/27/2017   Severe episode of recurrent major depressive disorder, without psychotic features (HCC)    Major depressive disorder, recurrent episode (HCC) 08/17/2016   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  Rationale for Evaluation and Treatment: Rehabilitation  PERTINENT HISTORY: 47 year old male with history of AVN of b-hips s/p R-THR, HTN, PTSD, depression  w/anxiety, ascending aortic aneurysm, 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 Adline Peals with 70% removal of tumor. Metronomic Temodar initiated. He completed standard radiation with concurrent temozolomide and Avastin in 2013. MRI on 11/09/2022 suspicious for progression and repeat MRI done 4 weeks later found  to have FLAIR lesion. He underwent repeat right temporal craniotomy for resection of tumor on 12/29/2022 by Dr. Lavonna Monarch at [facility]. This was complicated by bleeding from a branch of the MCA and post-op motor deficits of left side and left facial droop. Follow up MRI showed subjacent extra axial hemorrhage with layering blood products in right temporal lobe resection cavity with small volume SAH and trace cytotoxic edema in resection cavity.  "This (point to L) arm is a big challenge.  I will tell it to do something and it won't." Reports decreased sensation, limited FMC, limited ability to grade pressure.  Spouse reports that when attempting to complete bimanual tasks, LUE may start task with setup but will not complete task as RUE.  PRECAUTIONS: no lifting or strenuous exercise until cleared by MD  WEIGHT BEARING RESTRICTIONS: No   SUBJECTIVE:   SUBJECTIVE STATEMENT: Pt reports going to [facility] yesterday to get him started on oral chemo.    Pt accompanied by: self, mother  PAIN:  Are you having pain? Yes: NPRS scale: 0/10 Pain location: Lt hand feels cold and mild nerve pain  FALLS: Has patient fallen in last 6 months? No  LIVING ENVIRONMENT: Lives with: lives with their family (lives with spouse, 31 yo son and 27 yo daughter) Lives in: House/apartment Stairs: Yes: External: 3 steps; FIL can built hand rails Has following equipment at home: Single point cane, Environmental consultant - 2 wheeled, Marine scientist  PLOF: Independent, Independent with basic ADLs, and Vocation/Vocational requirements: [employer]  - unload trucks, break down pallets, Conservation officer, nature  PATIENT GOALS: to be able to play guitar again, not drop things   OBJECTIVE: (All objective assessments below are from initial evaluation on: 01/26/23 unless otherwise specified.)   HAND DOMINANCE: Right  ADLs: Transfers/ambulation related to ADLs: Mod I without AD Eating: would need assist to cut meat, knocks items off table  with L hand (use of 2 hands to stabilize and scoop) Grooming: utilizing LUE as gross assist to hold items but not utilizing during task UB Dressing: increased effort LB Dressing: increased effort, attempting to utilize LUE however it will frequently drop things. Difficulty with tying shoes/draw string and buttoning  Toileting: increased time/effort when tearing toilet paper Bathing: difficulty with use of LUE when washing R side of body Tub Shower transfers: Mod I stepping over tub ledge Equipment: Transfer tub bench  IADLs: Light housekeeping: attempted to feed dogs and dropped bowl from L hand Meal Prep: not currently cooking Community mobility: not cleared to drive Medication management: pt has a system and is able to remember to take as prescribed, difficulty with opening pill bottles  MOBILITY STATUS: Independent  POSTURE COMMENTS:  No Significant postural limitations  ACTIVITY TOLERANCE: Activity tolerance: WFL for tasks assessed  FUNCTIONAL OUTCOME MEASURES: Upper Extremity Functional Scale (UEFS): 18/80 or 22% functional (rated sole use of Lt arm)  UPPER EXTREMITY ROM:  RUE: WFL; LUE: Has active movement - can reach overhead and internal rotation with increased time/effort - difficulty sustaining, and increased compensations noted with fatigue   UPPER EXTREMITY MMT:   grossly 4+ to 5/5 bilaterally  HAND FUNCTION: 02/01/23: Grip: Rt: 125#, Lt: 53#  COORDINATION: Box and Blocks:  Right 42 blocks, Left 12 blocks 02/14/23: L: 20 blocks  SENSATION: 02/01/23: OT tries Semmes-Weinstein Testing and he cannot perceive 6.65 (largest) most of the time globally in Lt hand, and so had diminished or no protective sensation and was cautioned about that.   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 Reviewed typical length of wear and purpose of kinesiotape.  Reviewed visual compensatory strategies: pt did report that he continues to bump into door walking in to laundry room and L arm will get caught.  OT reiterated visual scanning/head turns and/or brining LUE in front when walking through doorway. NMR: OT reviewed weightbearing exercises through "developmental postures" for NMR with crunch and extend with all extremities, superman for extension, and weightbearing and prone on elbows with weight shifts and scapular prone retraction. Pt with increased tolerance with pushing up onto hands and knees into quadruped position.  Engaged in reaching with RUE to facilitate increased WB through LUE.  OT educated on child's pose to provide stretch and relaxation of LUE as pt with reports of tightness in L wrist with prolonged extension.  The entire time he was encouraged to breath through exercises. Reviewed prayer stretch, radial and median nerve glides, and sleeper stretch in sidelying.  OT providing demonstration and cues for sleeper stretch due to initial external rotation vs internal rotation per  instructions.   02/20/23 NMR: engaged in tasks to incorporate and challenge proprioceptive input.  Engaged in tapping numbers on wall with dowel in L hand to facilitate changing directions, sustained grasp, and motor control.  Pt demonstrating increased effort when reaching outside BOS compared to crossing midline.  Min difficulty with locating numbers in upper L quadrant.  Engaged in WB through BUE in quadruped with forward/backward rocking to facilitate increased WB - pt reports pain in L wrist with increased WB.  Reviewed developmental motor sequence in regards to proprioceptive input and motor recovery. Therapeutic activities: engaged in cup stacking, rotation, and ball toss with LUE and BUE with focus on proprioception and motor control.  Pt demonstrating improved grasp on cup with LUE, dropping x1.  Pt with ability to stack cups in pyramid formation without knocking over cups.  Progressed to ball toss with BUE, tossing ball with therapist with focus on bimanual coordination.  OT pointing out when RUE overtaking LUE with tossing.  Attempted ball toss within ipsilateral LUE with pt initially overthrowing but with improvements with repetition and cues  to visually attend to L hand and attempt to grade pressure with tossing.   Sensation: pt with papers from MD about sensation input, asking questions for clarification.  Discussed proprioceptive input as well as tactile sensation and strategies to facilitate recovery.  Reiterated and reviewed massage, tapping, and vibration.  Pt with hypersensitivity in forearm and hyposensation in digits.   02/17/23 Edema management: engaged in discussion about various compression garments, with plan to attempt tubigrip at future session vs pt purchasing commercially.  OT did provide pt with edema glove to attempt at home. Putty: pt reports difficulty when attempting to play guitar due to decreased strength, coordination, and motor planning.  Engaged in theraputty tasks  with red theraputty with movements designed to aid in coordination as needed for leisure pursuits.  OT providing mod cues for claw fist and "duck mouth" due to decreased motor planning, pt benefiting from completion without putty and then addition of putty.  OT also instructed in holding putty in L hand and completing various pinch patterns to simulate guitar chords.  Pt with difficulty motor planning.  Exercises - Full Fist  - 2-3 x daily - 5 reps - Thumb Opposition with Putty  - 2-3 x daily - 5 reps - "Duck Mouth" Strength  - 2-3 x daily - 5 reps - Seated Claw Fist with Putty  - 2-3 x daily - 5 reps  PATIENT EDUCATION: Education details: ongoing condition specific education.  Educated on sensation and proprioceptive input. Person educated: Patient Education method: Explanation, Demonstration, and Handouts Education comprehension: verbalized understanding and needs further education  HOME EXERCISE PROGRAM: Developmental Motor Sequence (Rood) Do each 2-3 x day, don't hold breath, slowly, 5-10x each Crunches with arms and legs (Flexor-withdrawl) Roll over (toward unaffected side)  "Superman" extension exercises (Pivot prone)       4. Push up/down and Rock side/side on elbows       5. Push up to get to hands and knees   Access Code: [email or web address] URL: [email or web address] Date: 02/03/2023 Prepared by: [facility] - Outpatient  Rehab - [facility]  Access Code: [email or web address] URL: [email or web address] Date: 02/17/2023 Prepared by: [facility] - Outpatient  Rehab - [facility]   GOALS: Goals reviewed with patient? Yes  SHORT TERM GOALS: Target date: 02/24/23  Pt will be independent in HEP for LUE coordination and ROM. Baseline: Goal status: MET - 02/23/23  2.  Pt will verbalize understanding of task modifications and/or potential AE needs to increase ease, safety, and independence w/ ADLs. Baseline:  Goal status: MET - 02/23/23  3.  Patient will  demonstrate improved awareness of visual management strategies for L inattention. Baseline:  Goal status: IN PROGRESS  4.  Pt will demonstrate improved UE functional use for ADLs as evidenced by increasing box/ blocks score by 4 blocks with LUE Baseline: Box and Blocks:  Right 42 blocks, Left 12 blocks Goal status: MET - L: 20 blocks on 02/14/23   LONG TERM GOALS: Target date: 03/24/23  Pt will demonstrate improved UE functional use for ADLs as evidenced by increasing box/ blocks score by 8 blocks with LUE Baseline: Box and Blocks:  Right 42 blocks, Left 12 blocks Goal status: IN PROGRESS  2.  Pt will demonstrate improved fine motor coordination for ADLs as evidenced by being able to complete 9 hole peg test with LUE in 2 min time limit. Baseline:  Goal status: IN PROGRESS  3.  Pt will demonstrate improved awareness of safety considerations and AE needs to ensure  safety d/t decreased sensation Baseline:  Goal status: IN PROGRESS  4.  Pt will demonstrate improved attention to LUE to allow for sustained grasp to transport household items 25' to increase engagement in IADLs. Baseline:  Goal status: IN PROGRESS  5.  Pt will demonstrate improved functional use of LUE as evidenced by improved score by TBD on UEFS. Baseline: 18/80 or 22% functional (rated sole use of Lt arm) Goal status: IN PROGRESS   ASSESSMENT:  CLINICAL IMPRESSION: Pt demonstrating increased sustained attention this session with increased focus on subject matter and recall of exercises as directed, with exception of benefiting from additional cues and instruction for sleeper stretch.  Pt continues to demonstrate edema in L hand and forearm despite attempts at repositioning and exercises, therefore kinesiotape applied with no skin issues at outset of session.  PLAN:  OT FREQUENCY: 2x/week  OT DURATION: 8 weeks  PLANNED INTERVENTIONS: self care/ADL training, therapeutic exercise, therapeutic activity, neuromuscular  re-education, manual therapy, passive range of motion, functional mobility training, splinting, electrical stimulation, ultrasound, compression bandaging, moist heat, cryotherapy, patient/family education, cognitive remediation/compensation, visual/perceptual remediation/compensation, psychosocial skills training, energy conservation, coping strategies training, and DME and/or AE instructions  CONSULTED AND AGREED WITH PLAN OF CARE: Patient and family member/caregiver  PLAN FOR NEXT SESSION:  Check shoulder stretches and internal and external rotation as well as forearm supination stretches.  Assist pt in organizing exercises!  continue on with functional activities and managing tightness and nerve issues.  Continue education on safety due to L inattention and decreased attention to tasks which impact driving.  Explore various compression garments.   Rosalio Loud, OTR/L 02/23/2023, 11:32 AM

## 2023-02-23 NOTE — Therapy (Signed)
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   Depression    Epistaxis    Headache    Hypertension    PTSD (post-traumatic stress disorder)    Seizures (HCC)    Past Surgical History:  Procedure Laterality Date   BASAL CELL CARCINOMA EXCISION  1995   BRAIN SURGERY  12/29/2022   CRANIOTOMY FOR TUMOR Right 02/25/2011   IR ANGIO EXTERNAL CAROTID SEL EXT CAROTID BILAT MOD SED  11/16/2021   IR ANGIO INTRA EXTRACRAN SEL COM CAROTID INNOMINATE UNI R MOD SED  11/16/2021   IR ANGIO INTRA EXTRACRAN SEL INTERNAL CAROTID UNI L MOD SED  11/16/2021   IR ANGIO VERTEBRAL SEL VERTEBRAL UNI R MOD SED  11/16/2021   IR ANGIOGRAM FOLLOW UP STUDY  11/17/2021   IR NEURO EACH ADD'L AFTER BASIC UNI LEFT (MS)  11/17/2021   IR NEURO EACH ADD'L AFTER BASIC UNI RIGHT (MS)  11/17/2021   IR TRANSCATH/EMBOLIZ  11/16/2021   IR US GUIDE VASC ACCESS RIGHT  11/16/2021   RADIOLOGY WITH ANESTHESIA N/A 11/16/2021   Procedure: IR WITH ANESTHESIA;  Surgeon: Baldemar Lenis, MD;  Location: [facility] OR;  Service: Radiology;  Laterality: N/A;   TOTAL HIP ARTHROPLASTY Right 12/21/2021   Procedure: RIGHT TOTAL HIP REPLACEMENT;  Surgeon: Tarry Kos, MD;  Location: [facility] OR;  Service: Orthopedics;  Laterality: Right;  3-C   WISDOM TOOTH EXTRACTION     Patient Active Problem  List   Diagnosis Date Noted   Left hemiparesis (HCC) 02/19/2023   Hemisensory deficit 02/19/2023   Shoulder-hand syndrome 02/19/2023   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 02/08/2023   Depression with anxiety 01/11/2023   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   Coagulation defect, unspecified (HCC) 11/02/2022   Amnestic MCI (mild cognitive impairment with memory loss) 11/02/2022   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) 11/02/2022   Abnormal finding on MRI of brain 05/30/2022   Aneurysm of ascending aorta without rupture (HCC) 02/28/2022   Anemia due to acquired thiamine deficiency 12/01/2021   Seasonal allergic rhinitis due to pollen 11/08/2021   Family history of dissecting aortic aneurysm 11/08/2021   Contrast media allergy 11/08/2021   Avascular necrosis of bone of right hip (HCC) 11/02/2021   Avascular necrosis of bone of left hip (HCC) 11/02/2021   Primary hypertension 05/17/2021   Encounter for general adult medical examination with abnormal findings 05/17/2021   PTSD (post-traumatic stress disorder) 06/05/2018   Nightmares 09/27/2017   Brain tumor, glioma (HCC) 09/27/2017   Severe episode of recurrent major depressive disorder, without psychotic features (HCC)    Major depressive disorder, recurrent episode (HCC) 08/17/2016   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  Pt accompanied by: self  PERTINENT HISTORY:  Patient is a 47 year old  male with history of AVN of b-hips s/p R-THR, HTN, PTSD, depression w/anxiety, ascending aortic aneurysm, 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 Adline Peals with 70% removal of tumor. Metronomic Temodar initiated. He completed standard radiation with concurrent temozolomide and Avastin in 2013. MRI on 11/09/2022 suspicious for progression and repeat MRI done 4 weeks later found to have FLAIR lesion. He underwent repeat right temporal craniotomy for resection of tumor on 12/29/2022 by Dr. Lavonna Monarch at [facility]. This was complicated by bleeding from a branch of the MCA and post-op motor deficits of left side and left facial droop. Follow up MRI showed subjacent extra axial hemorrhage with layering blood products in right temporal lobe resection cavity with small volume SAH and trace cytotoxic edema in resection cavity. Therapy evaluations completed with recommendations for CIR. Patient admitted 01/06/23. SLP consult today due to reports of cognitive changes and biting is tongue when eating.   Pt's wife sent a message to pt's therapists via MyChart on 01/25/23 which stated: Hello Dr. Sundra Aland, this is Shawn's wife Seward York and I wanted to write to you prior to our upcoming visit tomorrow to inform you of the current situation. Shloimy is having a hard time following through with simple tasks and it's causing a lot of stress in the home. He does not want to hear how important it is for him to do his PT from me, but he is not doing the exercises like he should. His depression is very severe and is aiding in the current situation at home. I wanted you to know ahead of time so I don't have to bring this up in front of him at our appointment.   PAIN:  Are you having pain? No  FALLS: Has patient fallen in last 6 months?  No  PATIENT GOALS: Return to work  OBJECTIVE:   DIAGNOSTIC FINDINGS:  MR BRAIN WO CONTRAST December 12 2022 IMPRESSION: 1. Changes since 2020 MRI in the residual superior Right temporal gyrus highly suspicious for Tumor Recurrence, including new mass-like T2/FLAIR hyperintense gyral expansion and small new multifocal nodular enhancement there. No intracranial mass effect 2. Also new since 2020 but chronic appearing lacunar infarcts in the right basal ganglia. 3. No other acute intracranial abnormality.   MR BRAIN WO CONTRAST  Jan 17 2023 IMPRESSION: 1. 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 Previously seen nodular foci of contrast enhancement in the right temporal lobe are no longer visualized. 2. New T2/FLAIR hyperintense signal abnormality in the right corona radiata with associated curvilinear contrast enhancement in these regions. This could potentially represent a region of subacute infarct. Recommend attention on follow-up. 3. Small volume subarachnoid hemorrhage and postoperative subdural hematoma along the right cerebral convexity, as above. 4. 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  5. Dural enhancement and thickening along the left occipital convexity and likely the left temporal convexity, which may be due to CSF hypotension.  Discharge from SLP service:Jan 17, 2023 Patient has met 1 of 1 long term goals.  Patient to discharge at overall Modified Independent level.  Reasons goals not met: N/A  Clinical Impression/Discharge Summary: Patient has made functional gains and has met 1 of 1 LTGs this admission. Currently, patient requires extra processing time and is overall Mod I for recall of daily information with use of compensatory strategies. Patient continues to report impaired sensation of his oral-motor musculature resulting in him biting  his lips and tongue intermittently. However, his strength appears intact without evidence of dysarthria and with 100% intelligibility. Patient and family education is complete and patient will discharge home with assistance from family. Due to patient's high level of functioning and overall independence at baseline, f/u outpatient SLP services are recommended.  Care Partner:  Caregiver Able to Provide Assistance: Yes  Type of Caregiver Assistance: Cognitive;Physical Recommendation:  Outpatient SLP;24 hour supervision/assistance  Rationale for SLP Follow Up: Maximize cognitive function and independence;Reduce caregiver burden   PATIENT REPORTED OUTCOME MEASURES (PROM): Cognitive Function: was given to pt 02/01/23 and asked to return next session.   TODAY'S TREATMENT:                                                                                                                                         DATE:   02/23/23: Pt was seen for skilled ST services targeting cognitive-communication. Provided edu on attention strategies re: managing fatigue to decrease cognitive load, managing distractions, brain breaks. SLP suggested pt get a pillbox to complete on Sundays for energy conservation as pt reports he takes his medicine every day. SLP and pt decided this would reduce pill bottle opening and increase efficiency. Pt is not consistently using alarms as discussed last session - pt reports he will continue to attempt to use this strategy to assist with time management.    02/20/23: Pt indicating challenges with time management. SLP A pt in generating solutions for improved time management and task initiation using his phone as an external aid. Reminders and alarm apps demonstrated with features such as reoccurrence and location. Given example task, pt able to verbalize how he would use external cognitive aid to optimize in 4/4 trials. Led pt through convergent naming task, 75% accuracy, benefiting from cues  to consider all details to form response. SLP facilitated generative language task with pt demonstrating need for visual reminders to aid in ability to complete accurately. With visual support and verbal cues to ongoing maintenance towards goal of completing 5, pt with 80% accuracy.   02/17/23: Very flat affect today. See "S" statement re: homework, phone, wallet. SLP asked for PROM which pt found in his therapy folder, not started. SLP told pt to complete for next session and asked him  when that was - and pt discovered at this moment he forgot phone at home. Given "S" and pt leaving his phone and wallet at home, SLP took the initial part of the session to assist pt in thinking of a better system to improve organization and planning. Pt unable to generate any feasible ideas so SLP suggested memory notebook/binder to organize his therapy information and achieve better organization in general (see pt instructions). SLP targeted organization and he separated all handouts with extra time and rare min A. He stuffed these back into his folder again and SLP req'd to provide cues for a more effective organizational strategy - for the time being, clip them together. Pt to hole punch handouts and put in 3-ring binder this weekend.  No dysarthria observed in today's session. Consider providing lingual strength exercises as pt has complained of slurred speech and his tongue feeling "fat" in one previous session.  02/14/23: Pt brought homework folder today, however homework was unfinished as described in "s". Pt was unaware homework was unfinished.  He was 10 minutes late for initial session (OT), reported to SLP "My mom was pushing me out of the house, I was taking a birdbath." When asked about this pt demonstrated reduced anticipatory awareness and stated he "always" had difficulty with time management at work as well - started projects too late or would take more time that he was allotted to complete.  Today SLP cued pt  to look at task prior to completing it to familarize himself with the task proir to jumping in. In these detailed functional tasks pt completed with 100% accuracy x2, but third task with 60% success (3/5 correct). He did not double check answers, which was the initial "takeaway" SLP reiterated to pt about not completing his homework. When asked how he could improve his accuracy he told SLP he should have double checked his answers.  02/10/23: Completed HEP but forgot at home. Denied difficulty as primary SLP assisted with initial game plan for execution. Reduced attention noted in conversation with pt fixating on computer screen. Attention mildly improved following SLP commenting on reduced attention and minimizing visual distractions. Targeted attention to detail task with following specific instruction and decoding message. Pt completed tasks x2 with pt independently noting errors mid-task which skewed result. Pt able to correct with additional time and mod A. Targeted metacognitive skills, in which pt unable to ID solution to aid performance. SLP prompted marking letters to aid attention and processing, in which pt stated "but that is so slow". Re-educated importance of slow rate to aid processing speed and attention to increase performance. Similar result occurred for second task. Pt verbally identified mistake but did not correct errors without prompt. Mild background noise (typing and voices in gym) was not impactful on performance. Pt reported tongue feels "fat" and reduced left facial sensation, which impacts intelligibility intermittently. Targeted velar sounds at word level for varied placements and increased number of syllables with rare errors noted. Benefited from slower rate for increased articulatory accuracy.   02/03/23: Pt did not return cognitive function PROM. SLP provided pt with targeted attention and organization tasks. Pt worked with detailed instrucutions and mathematical problems and did  not double check his answers. Two errors were made which were not noticed by pt. SLP stressed to pt he will need to double check his answers now more than ever before. Pt said he would do so on remaining homework problems. SLP also told pt to download MyChart app to track  appointments.   02/01/23: SLP completed the CLQT and discussed results with pt. Organization, planning, memory, and attention appear to be pt's deficit areas for SLP to target. Results are above.  01/26/23: SLP discussed some compensations for mealtime as pt stated he is having difficulty feeling food on the lt labial margin during meals - setting a mirror up in front of him at meals was suggested.  Standardized eval results, as much as pt has completed, were also reviewed.  PATIENT EDUCATION: Education details: see "today's treatment" Person educated: Patient Education method: Explanation, Demonstration, and Verbal cues Education comprehension: verbalized understanding and needs further education   GOALS: Goals reviewed with patient? No  SHORT TERM GOALS: Target date: 03/03/23  Pt will demo alternating attention with min-mod complex tasks involving language with compensations 90%, in 3 sessions Baseline: Goal status: IN PROGRESS  2.  Pt/family will report pt completes household tasks involving language for 30 consecutive minutes with compensations if necessary, in 3sessions Baseline:  Goal status: IN PROGRESS  3.  Pt will demo use of cognitive (memory, planning/organization, attention, awareness) compensations when performing detailed language tasks in order to achieve 90% success, in 3 sessions Baseline:  Goal status: IN PROGRESS  4.  Pt will demo compensations with POs for reported labial leakage due to facial numbness on lt (including upper/lower lips)  Baseline:  Goal Status: IN PROGRESS   LONG TERM GOALS: Target date: 03/31/23  Pt will demo alternating attention with mod-max complex work-like tasks involving  language (using interruptions) with compensations 100%, in 3 sessions Baseline:  Goal status: IN PROGRESS  2.  Pt/family will report pt completes household tasks involving language for 60 minutes with compensations if necessary, in 4 sessions Baseline:  Goal status: IN PROGRESS  3.  Pt will demo knowledge of need to employ compensations when performing detailed language tasks in order to achieve 100% success, in 4 sessions Baseline:  Goal status: IN PROGRESS  4.  Pt will tell SLP 3 compensations he may need to use at work to improve accuracy with recall or attention Baseline:  Goal status: IN PROGRESS  5.  Pt will score higher on cognitive PROM in the last 1-2 sessions than on initial administration  Baseline:  Goal Status: IN PROGRESS   ASSESSMENT:  CLINICAL IMPRESSION: Patient is a 47 y.o. male who was seen today therapy targeting planning, organization, and attention. See "today's treatment" for more details of today's session. Pt's cognition as it stands currently would be detrimental to his job security and pt would benefit from skilled ST to improve these skills.   OBJECTIVE IMPAIRMENTS: include attention, memory, awareness, and executive functioning. These impairments are limiting patient from return to work, managing medications, managing appointments, managing finances, household responsibilities, ADLs/IADLs, and effectively communicating at home and in community. Factors affecting potential to achieve goals and functional outcome are cooperation/participation level (according to wife, Seward York). Patient will benefit from skilled SLP services to address above impairments and improve overall function.  REHAB POTENTIAL: Good  PLAN:  SLP FREQUENCY: 2x/week  SLP DURATION: 8 weeks (or 17 sessions)  PLANNED INTERVENTIONS: Language facilitation, Environmental controls, Cueing hierachy, Cognitive reorganization, Internal/external aids, Functional tasks, SLP instruction and  feedback, Compensatory strategies, and Patient/family education    Maia Breslow, CCC-SLP 02/20/2023, 1:07 PM

## 2023-02-23 NOTE — Telephone Encounter (Signed)
Prior Authorization submitted, waiting response

## 2023-02-23 NOTE — Telephone Encounter (Signed)
Prior Approval received effective through 08/28/2038 for [employer] 2 mg with [employer] [place].

## 2023-02-27 ENCOUNTER — Ambulatory Visit: Payer: BC Managed Care – PPO | Admitting: Occupational Therapy

## 2023-02-27 ENCOUNTER — Ambulatory Visit: Payer: BC Managed Care – PPO | Attending: Physician Assistant

## 2023-02-27 DIAGNOSIS — R4701 Aphasia: Secondary | ICD-10-CM | POA: Diagnosis not present

## 2023-02-27 DIAGNOSIS — R278 Other lack of coordination: Secondary | ICD-10-CM | POA: Insufficient documentation

## 2023-02-27 DIAGNOSIS — R208 Other disturbances of skin sensation: Secondary | ICD-10-CM

## 2023-02-27 DIAGNOSIS — M6281 Muscle weakness (generalized): Secondary | ICD-10-CM

## 2023-02-27 DIAGNOSIS — I69354 Hemiplegia and hemiparesis following cerebral infarction affecting left non-dominant side: Secondary | ICD-10-CM | POA: Insufficient documentation

## 2023-02-27 DIAGNOSIS — R41841 Cognitive communication deficit: Secondary | ICD-10-CM | POA: Insufficient documentation

## 2023-02-27 DIAGNOSIS — R41840 Attention and concentration deficit: Secondary | ICD-10-CM | POA: Diagnosis not present

## 2023-02-27 NOTE — Therapy (Signed)
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   Depression    Epistaxis    Headache    Hypertension    PTSD (post-traumatic stress disorder)    Seizures (HCC)    Past Surgical History:  Procedure Laterality Date   BASAL CELL CARCINOMA EXCISION  1995   BRAIN SURGERY  12/29/2022   CRANIOTOMY FOR TUMOR Right 02/25/2011   IR ANGIO EXTERNAL CAROTID SEL EXT CAROTID BILAT MOD SED  11/16/2021   IR ANGIO INTRA EXTRACRAN SEL COM CAROTID INNOMINATE UNI R MOD SED  11/16/2021   IR ANGIO INTRA EXTRACRAN SEL INTERNAL CAROTID UNI L MOD SED  11/16/2021   IR ANGIO VERTEBRAL SEL VERTEBRAL UNI R MOD SED  11/16/2021   IR ANGIOGRAM FOLLOW UP STUDY  11/17/2021   IR NEURO EACH ADD'L AFTER BASIC UNI LEFT (MS)  11/17/2021   IR NEURO EACH ADD'L AFTER BASIC UNI RIGHT (MS)  11/17/2021   IR TRANSCATH/EMBOLIZ  11/16/2021   IR US GUIDE VASC ACCESS RIGHT  11/16/2021   RADIOLOGY WITH ANESTHESIA N/A 11/16/2021   Procedure: IR WITH ANESTHESIA;  Surgeon: Shawn Lenis, MD;  Location: [facility];  Service: Radiology;  Laterality: N/A;   TOTAL HIP ARTHROPLASTY Right 12/21/2021   Procedure: RIGHT TOTAL HIP REPLACEMENT;  Surgeon: Shawn Kos, MD;  Location: [facility] OR;  Service: Orthopedics;  Laterality: Right;  3-C   WISDOM TOOTH EXTRACTION     Patient Active Problem List    Diagnosis Date Noted   Left hemiparesis (HCC) 02/19/2023   Hemisensory deficit 02/19/2023   Shoulder-hand syndrome 02/19/2023   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 02/08/2023   Depression with anxiety 01/11/2023   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   Coagulation defect, unspecified (HCC) 11/02/2022   Amnestic MCI (mild cognitive impairment with memory loss) 11/02/2022   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) 11/02/2022   Abnormal finding on MRI of brain 05/30/2022   Aneurysm of ascending aorta without rupture (HCC) 02/28/2022   Anemia due to acquired thiamine deficiency 12/01/2021   Seasonal allergic rhinitis due to pollen 11/08/2021   Family history of dissecting aortic aneurysm 11/08/2021   Contrast media allergy 11/08/2021   Avascular necrosis of bone of right hip (HCC) 11/02/2021   Avascular necrosis of bone of left hip (HCC) 11/02/2021   Primary hypertension 05/17/2021   Encounter for general adult medical examination with abnormal findings 05/17/2021   PTSD (post-traumatic stress disorder) 06/05/2018   Nightmares 09/27/2017   Brain tumor, glioma (HCC) 09/27/2017   Severe episode of recurrent major depressive disorder, without psychotic features (HCC)    Major depressive disorder, recurrent episode (HCC) 08/17/2016   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 He brought back cognitive PROM with everything filled in. He started 5 day on/23 day off  cycle of Temodar after [facility] visit last week. Pt accompanied by: self  PERTINENT HISTORY:  Patient is a 47 year old male with history of AVN of b-hips s/p R-THR, HTN, PTSD, depression w/anxiety, ascending aortic aneurysm, anaplastic oligodendroglioma diagnosed in May of 2012 and underwent right temporal craniotomy by [facility] with 70% removal of tumor. Metronomic Temodar initiated. He completed standard radiation with concurrent temozolomide and Avastin in 2013. MRI on 11/09/2022 suspicious for progression and repeat MRI done 4 weeks later found to have FLAIR lesion. He underwent repeat right temporal craniotomy for resection of tumor on 12/29/2022 by Dr. Lavonna [facility] at [facility]. This was complicated by bleeding from a branch of the MCA and post-op motor deficits of left side and left facial droop. Follow up MRI showed subjacent extra axial hemorrhage with layering blood products in right temporal lobe resection cavity with small volume SAH and trace cytotoxic edema in resection cavity. Therapy evaluations completed with recommendations for CIR. Patient admitted 01/06/23. SLP consult today due to reports of cognitive changes and biting is tongue when eating.   Pt's wife sent a message to pt's therapists via MyChart on 01/25/23 which stated: Hello Dr. Sundra [facility], this is [facility] and I wanted to write to you prior to our upcoming visit tomorrow to inform you of the current situation. [facility] is having a hard time following through with simple tasks and it's causing a lot of stress in the home. He does not want to hear how important it is for him to do his PT from me, but he is not doing the exercises like he should. His depression is very severe and is aiding in the current situation at home. I wanted you to know ahead of time so I don't have to bring this up in front of him at our appointment.   PAIN:  Are you having pain? No  FALLS: Has patient fallen in last 6 months?   No  PATIENT GOALS: Return to work  OBJECTIVE:   DIAGNOSTIC FINDINGS:  MR BRAIN WO CONTRAST December 12 2022 IMPRESSION: 1. Changes since 2020 MRI in the residual superior Right temporal gyrus highly suspicious for Tumor Recurrence, including new mass-like T2/FLAIR hyperintense gyral expansion and small new multifocal nodular enhancement there. No intracranial mass effect 2. Also new since 2020 but chronic appearing lacunar infarcts in the right basal ganglia. 3. No other acute intracranial abnormality.   MR BRAIN WO CONTRAST  Jan 17 2023 IMPRESSION: 1. 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 Previously seen nodular foci of contrast enhancement in the right temporal lobe are no longer visualized. 2. New T2/FLAIR hyperintense signal abnormality in the right corona radiata with associated curvilinear contrast enhancement in these regions. This could potentially represent a region of subacute infarct. Recommend attention on follow-up. 3. Small volume subarachnoid hemorrhage and postoperative subdural hematoma along the right cerebral convexity, as above. 4. 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 5. Dural enhancement and thickening along the left occipital convexity and likely the left temporal convexity, which may be due to CSF hypotension.  Discharge from SLP service:Jan 17, 2023 Patient has met 1 of 1 long term goals.  Patient to discharge at overall Modified Independent level.  Reasons goals not met: N/A  Clinical Impression/Discharge Summary: Patient has made functional gains and has met 1 of 1 LTGs this admission. Currently, patient requires extra processing time and is overall Mod I for recall of daily information with use of compensatory  strategies. Patient continues to report impaired sensation of his oral-motor musculature resulting in him biting his lips and tongue intermittently. However, his strength appears intact without evidence of dysarthria and with 100% intelligibility. Patient and family education is complete and patient will discharge home with assistance from family. Due to patient's high level of functioning and overall independence at baseline, f/u outpatient SLP services are recommended.  Care Partner:  Caregiver Able to Provide Assistance: Yes  Type of Caregiver Assistance: Cognitive;Physical Recommendation:  Outpatient SLP;24 hour supervision/assistance  Rationale for SLP Follow Up: Maximize cognitive function and independence;Reduce caregiver burden   PATIENT REPORTED OUTCOME MEASURES (PROM): Cognitive Function: returned 02/27/23 and pt scored 91/140, with higher scores indicating lesser impact of cognition on QOL.   TODAY'S TREATMENT:                                                                                                                                         DATE:   02/27/23:Pt acknowledged that the 3-ring binder is helping in keeping him better organized than the folder. Papers were hole-punched and put in the binder. SLP inquired about pt's session Thursday and Antonino told SLP some points using the visual reminder that SLP had written for him. SLP reviewed mental energy/energy conservation with pt. Pt has not obtained a pillbox, states he will do that this week. Pt stated [facility] also initiated a low dose of Ritalin. Please see [facility] note dated 02/22/23 in Care Everywhere for details on dosages. Today SLP assisted pt complete a simple but detailed functional task with simple executive function (time management of errands). Pt req'd min-mod cues for sequencing logically (left groceries in car for other errands), and for time to complete tasks. Homework for simple sequencing of written steps.  02/23/23: Pt was  seen for skilled ST services targeting cognitive-communication. Provided edu on attention strategies re: managing fatigue to decrease cognitive load, managing distractions, brain breaks. SLP suggested pt get a pillbox to complete on Sundays for energy conservation as pt reports he takes his medicine every day. SLP and pt decided this would reduce pill bottle opening and increase efficiency. Pt is not consistently using alarms as discussed last session - pt reports he will continue to attempt to use this  strategy to assist with time management.    02/20/23: Pt indicating challenges with time management. SLP A pt in generating solutions for improved time management and task initiation using his phone as an external aid. Reminders and alarm apps demonstrated with features such as reoccurrence and location. Given example task, pt able to verbalize how he would use external cognitive aid to optimize in 4/4 trials. Led pt through convergent naming task, 75% accuracy, benefiting from cues to consider all details to form response. SLP facilitated generative language task with pt demonstrating need for visual reminders to aid in ability to complete accurately. With visual support and verbal cues to ongoing maintenance towards goal of completing 5, pt with 80% accuracy.   02/17/23: Very flat affect today. See "S" statement re: homework, phone, wallet. SLP asked for PROM which pt found in his therapy folder, not started. SLP told pt to complete for next session and asked him when that was - and pt discovered at this moment he forgot phone at home. Given "S" and pt leaving his phone and wallet at home, SLP took the initial part of the session to assist pt in thinking of a better system to improve organization and planning. Pt unable to generate any feasible ideas so SLP suggested memory notebook/binder to organize his therapy information and achieve better organization in general (see pt instructions). SLP targeted  organization and he separated all handouts with extra time and rare min A. He stuffed these back into his folder again and SLP req'd to provide cues for a more effective organizational strategy - for the time being, clip them together. Pt to hole punch handouts and put in 3-ring binder this weekend.  No dysarthria observed in today's session. Consider providing lingual strength exercises as pt has complained of slurred speech and his tongue feeling "fat" in one previous session.  02/14/23: Pt brought homework folder today, however homework was unfinished as described in "s". Pt was unaware homework was unfinished.  He was 10 minutes late for initial session (OT), reported to SLP "My mom was pushing me out of the house, I was taking a birdbath." When asked about this pt demonstrated reduced anticipatory awareness and stated he "always" had difficulty with time management at work as well - started projects too late or would take more time that he was allotted to complete.  Today SLP cued pt to look at task prior to completing it to familarize himself with the task proir to jumping in. In these detailed functional tasks pt completed with 100% accuracy x2, but third task with 60% success (3/5 correct). He did not double check answers, which was the initial "takeaway" SLP reiterated to pt about not completing his homework. When asked how he could improve his accuracy he told SLP he should have double checked his answers.  02/10/23: Completed HEP but forgot at home. Denied difficulty as primary SLP assisted with initial game plan for execution. Reduced attention noted in conversation with pt fixating on computer screen. Attention mildly improved following SLP commenting on reduced attention and minimizing visual distractions. Targeted attention to detail task with following specific instruction and decoding message. Pt completed tasks x2 with pt independently noting errors mid-task which skewed result. Pt able to  correct with additional time and mod A. Targeted metacognitive skills, in which pt unable to ID solution to aid performance. SLP prompted marking letters to aid attention and processing, in which pt stated "but that is so slow". Re-educated importance of slow rate  to aid processing speed and attention to increase performance. Similar result occurred for second task. Pt verbally identified mistake but did not correct errors without prompt. Mild background noise (typing and voices in gym) was not impactful on performance. Pt reported tongue feels "fat" and reduced left facial sensation, which impacts intelligibility intermittently. Targeted velar sounds at word level for varied placements and increased number of syllables with rare errors noted. Benefited from slower rate for increased articulatory accuracy.   02/03/23: Pt did not return cognitive function PROM. SLP provided pt with targeted attention and organization tasks. Pt worked with detailed instrucutions and mathematical problems and did not double check his answers. Two errors were made which were not noticed by pt. SLP stressed to pt he will need to double check his answers now more than ever before. Pt said he would do so on remaining homework problems. SLP also told pt to download MyChart app to track appointments.   02/01/23: SLP completed the CLQT and discussed results with pt. Organization, planning, memory, and attention appear to be pt's deficit areas for SLP to target. Results are above.  01/26/23: SLP discussed some compensations for mealtime as pt stated he is having difficulty feeling food on the lt labial margin during meals - setting a mirror up in front of him at meals was suggested.  Standardized eval results, as much as pt has completed, were also reviewed.  PATIENT EDUCATION: Education details: see "today's treatment" Person educated: Patient Education method: Explanation, Demonstration, and Verbal cues Education comprehension:  verbalized understanding and needs further education   GOALS: Goals reviewed with patient? No  SHORT TERM GOALS: Target date: 03/03/23  Pt will demo alternating attention with min-mod complex tasks involving language with compensations 90%, in 3 sessions Baseline:02/27/23 Goal status: IN PROGRESS  2.  Pt/family will report pt completes household tasks involving language for 30 consecutive minutes with compensations if necessary, in 3sessions Baseline:  Goal status: IN PROGRESS  3.  Pt will demo use of cognitive (memory, planning/organization, attention, awareness) compensations when performing detailed language tasks in order to achieve 90% success, in 3 sessions Baseline:  Goal status: IN PROGRESS  4.  Pt will demo compensations with POs for reported labial leakage due to facial numbness on lt (including upper/lower lips)  Baseline:  Goal Status: IN PROGRESS   LONG TERM GOALS: Target date: 03/31/23  Pt will demo alternating attention with mod-max complex work-like tasks involving language (using interruptions) with compensations 100%, in 3 sessions Baseline:  Goal status: IN PROGRESS  2.  Pt/family will report pt completes household tasks involving language for 60 minutes with compensations if necessary, in 4 sessions Baseline:  Goal status: IN PROGRESS  3.  Pt will demo knowledge of need to employ compensations when performing detailed language tasks in order to achieve 100% success, in 4 sessions Baseline:  Goal status: IN PROGRESS  4.  Pt will tell SLP 3 compensations he may need to use at work to improve accuracy with recall or attention Baseline:  Goal status: IN PROGRESS  5.  Pt will score higher on cognitive PROM in the last 1-2 sessions than on initial administration  Baseline:  Goal Status: IN PROGRESS   ASSESSMENT:  CLINICAL IMPRESSION: Patient is a 47 y.o. male who was seen today therapy targeting planning, organization, and attention. See "today's treatment"  for more details of today's session. Pt's cognition as it stands currently would be detrimental to his job security and pt would benefit from skilled ST to  improve these skills.   OBJECTIVE IMPAIRMENTS: include attention, memory, awareness, and executive functioning. These impairments are limiting patient from return to work, managing medications, managing appointments, managing finances, household responsibilities, ADLs/IADLs, and effectively communicating at home and in community. Factors affecting potential to achieve goals and functional outcome are cooperation/participation level (according to wife, Seward [facility]). Patient will benefit from skilled SLP services to address above impairments and improve overall function.  REHAB POTENTIAL: Good  PLAN:  SLP FREQUENCY: 2x/week  SLP DURATION: 8 weeks (or 17 sessions)  PLANNED INTERVENTIONS: Language facilitation, Environmental controls, Cueing hierachy, Cognitive reorganization, Internal/external aids, Functional tasks, SLP instruction and feedback, Compensatory strategies, and Patient/family education    [employer] [facility], CCC-SLP 02/27/2023, 12:35 PM

## 2023-02-27 NOTE — Therapy (Signed)
OUTPATIENT OCCUPATIONAL THERAPY NEURO TREATMENT NOTE  Patient Name: Shawn York MRN: [id number] DOB:Mar 14, 1976, 47 y.o., male Today's Date: 02/27/2023  PCP: Etta Grandchild, MD REFERRING PROVIDER: Milinda Antis, PA-C  END OF SESSION:  OT End of Session - 02/27/23 1159     Visit Number 9    Number of Visits 17    Date for OT Re-Evaluation 03/24/23    Authorization Type BC[employer]    OT Start Time 1148    OT Stop Time 1230    OT Time Calculation (min) 42 min    Activity Tolerance No increased pain;Patient tolerated treatment well;Patient limited by fatigue;Patient limited by pain    Behavior During Therapy [facility] for tasks assessed/performed                   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   Depression    Epistaxis    Headache    Hypertension    PTSD (post-traumatic stress disorder)    Seizures (HCC)    Past Surgical History:  Procedure Laterality Date   BASAL CELL CARCINOMA EXCISION  1995   BRAIN SURGERY  12/29/2022   CRANIOTOMY FOR TUMOR Right 02/25/2011   IR ANGIO EXTERNAL CAROTID SEL EXT CAROTID BILAT MOD SED  11/16/2021   IR ANGIO INTRA EXTRACRAN SEL COM CAROTID INNOMINATE UNI R MOD SED  11/16/2021   IR ANGIO INTRA EXTRACRAN SEL INTERNAL CAROTID UNI L MOD SED  11/16/2021   IR ANGIO VERTEBRAL SEL VERTEBRAL UNI R MOD SED  11/16/2021   IR ANGIOGRAM FOLLOW UP STUDY  11/17/2021   IR NEURO EACH ADD'L AFTER BASIC UNI LEFT (MS)  11/17/2021   IR NEURO EACH ADD'L AFTER BASIC UNI RIGHT (MS)  11/17/2021   IR TRANSCATH/EMBOLIZ  11/16/2021   IR US GUIDE VASC ACCESS RIGHT  11/16/2021   RADIOLOGY WITH ANESTHESIA N/A 11/16/2021   Procedure: IR WITH ANESTHESIA;  Surgeon: Baldemar Lenis, MD;  [facility] OR;  Service: Radiology;  Laterality: N/A;   TOTAL HIP ARTHROPLASTY Right 12/21/2021   Procedure: RIGHT TOTAL HIP REPLACEMENT;  Surgeon: Tarry Kos, MD;  Location: [facility] OR;  Service: Orthopedics;  Laterality: Right;  3-C   WISDOM TOOTH EXTRACTION     Patient Active Problem List   Diagnosis Date Noted   Left hemiparesis (HCC) 02/19/2023   Hemisensory deficit 02/19/2023   Shoulder-hand syndrome 02/19/2023   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 02/08/2023   Depression with anxiety 01/11/2023   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   Coagulation defect, unspecified (HCC) 11/02/2022   Amnestic MCI (mild cognitive impairment with memory loss) 11/02/2022   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) 11/02/2022   Abnormal finding on MRI of brain 05/30/2022   Aneurysm of ascending aorta without rupture (HCC) 02/28/2022   Anemia due to acquired thiamine deficiency 12/01/2021   Seasonal allergic rhinitis due to pollen 11/08/2021   Family history of dissecting aortic aneurysm 11/08/2021   Contrast media allergy 11/08/2021   Avascular necrosis of bone of right hip (HCC) 11/02/2021   Avascular necrosis of bone of left hip (HCC) 11/02/2021   Primary hypertension 05/17/2021   Encounter for general adult medical examination with abnormal findings 05/17/2021   PTSD (post-traumatic stress disorder) 06/05/2018   Nightmares 09/27/2017   Brain tumor, glioma (HCC) 09/27/2017   Severe episode  of recurrent major depressive disorder, without psychotic features (HCC)    Major depressive disorder, recurrent episode (HCC) 08/17/2016   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    ONSET DATE: 12/29/22 DOS craniotomy and tumor resection   REFERRING DIAG: C71.2 (ICD-10-CM) - Anaplastic oligodendroglioma of temporal lobe (HCC) I63.112 (ICD-10-CM) - 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  THERAPY DIAG:  Hemiplegia and hemiparesis following cerebral infarction affecting left non-dominant side (HCC)  Other lack of coordination  Muscle  weakness (generalized)  Other disturbances of skin sensation  Rationale for Evaluation and Treatment: Rehabilitation  PERTINENT HISTORY: 47 year old male with history of AVN of b-hips s/p R-THR, HTN, PTSD, depression w/anxiety, ascending aortic aneurysm, 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 Adline Peals with 70% removal of tumor. Metronomic Temodar initiated. He completed standard radiation with concurrent temozolomide and Avastin in 2013. MRI on 11/09/2022 suspicious for progression and repeat MRI done 4 weeks later found to have FLAIR lesion. He underwent repeat right temporal craniotomy for resection of tumor on 12/29/2022 by Dr. Lavonna Monarch at [facility]. This was complicated by bleeding from a branch of the MCA and post-op motor deficits of left side and left facial droop. Follow up MRI showed subjacent extra axial hemorrhage with layering blood products in right temporal lobe resection cavity with small volume SAH and trace cytotoxic edema in resection cavity.  "This (point to L) arm is a big challenge.  I will tell it to do something and it won't." Reports decreased sensation, limited FMC, limited ability to grade pressure.  Spouse reports that when attempting to complete bimanual tasks, LUE may start task with setup but will not complete task as RUE.  PRECAUTIONS: no lifting or strenuous exercise until cleared by MD  WEIGHT BEARING RESTRICTIONS: No   SUBJECTIVE:   SUBJECTIVE STATEMENT: Pt reports being under the cabinet attempting to fix something and kept dropping items.    Pt accompanied by: self, mother  PAIN:  Are you having pain? Yes: NPRS scale: not rated/10 Pain description: throbbing Pain location: Lt hand feels cold and mild nerve pain  FALLS: Has patient fallen in last 6 months? 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, break down pallets, Conservation officer, nature  PATIENT GOALS: to be able to play guitar again, not drop things   OBJECTIVE: (All objective assessments below are from initial evaluation on: 01/26/23 unless otherwise specified.)   HAND DOMINANCE: Right  ADLs: Transfers/ambulation related to ADLs: Mod I without AD Eating: would need assist to cut meat, knocks items off table with L hand (use of 2 hands to stabilize and scoop) Grooming: utilizing LUE as gross assist to hold items but not utilizing during task UB Dressing: increased effort LB Dressing: increased effort, attempting to utilize LUE however it will frequently drop things. Difficulty with tying shoes/draw string and buttoning  Toileting: increased time/effort when tearing toilet paper Bathing: difficulty with use of LUE when washing R side of body Tub Shower transfers: Mod I stepping over tub ledge Equipment: Transfer tub bench  IADLs: Light housekeeping: attempted to feed dogs and dropped bowl from  L hand Meal Prep: not currently cooking Community mobility: not cleared to drive Medication management: pt has a system and is able to remember to take as prescribed, difficulty with opening pill bottles  MOBILITY STATUS: Independent  POSTURE COMMENTS:  No Significant postural limitations  ACTIVITY TOLERANCE: Activity tolerance: WFL for tasks assessed  FUNCTIONAL OUTCOME MEASURES: Upper Extremity Functional Scale (UEFS): 18/80 or 22% functional (rated sole use of Lt arm)  UPPER EXTREMITY ROM:  RUE: WFL; LUE: Has active movement - can reach overhead and internal rotation with increased time/effort - difficulty sustaining, and increased compensations noted with  fatigue   UPPER EXTREMITY MMT:   grossly 4+ to 5/5 bilaterally  HAND FUNCTION: 02/01/23: Grip: Rt: 125#, Lt: 53#  COORDINATION: Box and Blocks:  Right 42 blocks, Left 12 blocks 02/14/23: L: 20 blocks  SENSATION: 02/01/23: OT tries Semmes-Weinstein Testing and he cannot perceive 6.65 (largest) most of the time globally in Lt hand, and so had diminished or no protective sensation and was cautioned about that.   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  Pt able to pick up and place 4 pegs with significantly increased time, largely due to impaired sensation.  OT providing cues to visually attend to LUE during structured tasks, especially to compensate for impaired sensation.  Transitioned to screw/unscrew nuts/bolts with focus on motor control and attention to UE during task. NMR: engaged in WB in quadruped with WB through elbows and forearms due to discomfort when WB through L hand.  Engaged in alternating reaching and WB when picking up pegs with alternating UE.  Pt able to complete large  and medium pegs, attempting small pegs with significant increased challenge.  OT drawing attention to motor control with LUE vs RUE and cues to decrease shoulder and elbow compensatory movements when placing pegs.  OT applied wash cloth under pegs to provide contrast of textures when picking up pegs - pt with increased success with addition of wash cloth. Shoulder ROM: engaged in shoulder flexion and abduction with use of large therapy ball to further facilitate ROM and stretching at end range.  Pt reports tightness in shoulder prior to ROM, tolerating ROM.    02/23/23 Kinesiotape: OT applied kinesiotape to L hand and forearm, anchored at lateral epicondyle and applied to forearm with 20-35% tension past DIP to facilitate edema management. Reviewed typical length of wear and purpose of kinesiotape.  Reviewed visual compensatory strategies: pt did report that he continues to bump into door walking in to laundry room  and L arm will get caught.  OT reiterated visual scanning/head turns and/or brining LUE in front when walking through doorway. NMR: OT reviewed weightbearing exercises through "developmental postures" for NMR with crunch and extend with all extremities, superman for extension, and weightbearing and prone on elbows with weight shifts and scapular prone retraction. Pt with increased tolerance with pushing up onto hands and knees into quadruped position.  Engaged in reaching with RUE to facilitate increased WB through LUE.  OT educated on child's pose to provide stretch and relaxation of LUE as pt with reports of tightness in L wrist with prolonged extension.  The entire time he was encouraged to breath through exercises. Reviewed prayer stretch, radial and median nerve glides, and sleeper stretch in sidelying.  OT providing demonstration and cues for sleeper stretch due to initial external rotation vs internal rotation per instructions.   02/20/23 NMR: engaged in tasks to incorporate and  challenge proprioceptive input.  Engaged in tapping numbers on wall with dowel in L hand to facilitate changing directions, sustained grasp, and motor control.  Pt demonstrating increased effort when reaching outside BOS compared to crossing midline.  Min difficulty with locating numbers in upper L quadrant.  Engaged in WB through BUE in quadruped with forward/backward rocking to facilitate increased WB - pt reports pain in L wrist with increased WB.  Reviewed developmental motor sequence in regards to proprioceptive input and motor recovery. Therapeutic activities: engaged in cup stacking, rotation, and ball toss with LUE and BUE with focus on proprioception and motor control.  Pt demonstrating improved grasp on cup with LUE, dropping x1.  Pt with ability to stack cups in pyramid formation without knocking over cups.  Progressed to ball toss with BUE, tossing ball with therapist with focus on bimanual coordination.  OT pointing out when RUE overtaking LUE with tossing.  Attempted ball toss within ipsilateral LUE with pt initially overthrowing but with improvements with repetition and cues to visually attend to L hand and attempt to grade pressure with tossing.   Sensation: pt with papers from MD about sensation input, asking questions for clarification.  Discussed proprioceptive input as well as tactile sensation and strategies to facilitate recovery.  Reiterated and reviewed massage, tapping, and vibration.  Pt with hypersensitivity in forearm and hyposensation in digits.    PATIENT EDUCATION: Education details: ongoing condition specific education.  Educated on sensation and proprioceptive input. Person educated: Patient Education method: Explanation, Demonstration, and Handouts Education comprehension: verbalized understanding and needs further education  HOME EXERCISE PROGRAM: Developmental Motor Sequence (Rood) Do each 2-3 x day, don't hold breath, slowly, 5-10x each Crunches with arms and legs  (Flexor-withdrawl) Roll over (toward unaffected side)  "Superman" extension exercises (Pivot prone)       4. Push up/down and Rock side/side on elbows       5. Push up to get to hands and knees   Access Code: [email or web address] URL: [email or web address] Date: 02/03/2023 Prepared by: [facility] - Outpatient  Rehab - [facility]  Access Code: [email or web address] URL: [email or web address] Date: 02/17/2023 Prepared by: [facility] - Outpatient  Rehab - [facility]   GOALS: Goals reviewed with patient? Yes  SHORT TERM GOALS: Target date: 02/24/23  Pt will be independent in HEP for LUE coordination and ROM. Baseline: Goal status: MET - 02/23/23  2.  Pt will verbalize understanding of task modifications and/or potential AE needs to increase ease, safety, and independence w/ ADLs. Baseline:  Goal status: MET - 02/23/23  3.  Patient  will demonstrate improved awareness of visual management strategies for L inattention. Baseline:  Goal status: IN PROGRESS  4.  Pt will demonstrate improved UE functional use for ADLs as evidenced by increasing box/ blocks score by 4 blocks with LUE Baseline: Box and Blocks:  Right 42 blocks, Left 12 blocks Goal status: MET - L: 20 blocks on 02/14/23   LONG TERM GOALS: Target date: 03/24/23  Pt will demonstrate improved UE functional use for ADLs as evidenced by increasing box/ blocks score by 8 blocks with LUE Baseline: Box and Blocks:  Right 42 blocks, Left 12 blocks Goal status: IN PROGRESS  2.  Pt will demonstrate improved fine motor coordination for ADLs as evidenced by being able to complete 9 hole peg test with LUE in 2 min time limit. Baseline:  Goal status: IN PROGRESS  3.  Pt will demonstrate improved awareness of safety considerations and AE needs to ensure safety d/t decreased sensation Baseline:  Goal status: IN PROGRESS  4.  Pt will demonstrate improved attention to LUE to allow for sustained grasp to transport  household items 25' to increase engagement in IADLs. Baseline:  Goal status: IN PROGRESS  5.  Pt will demonstrate improved functional use of LUE as evidenced by improved score by TBD on UEFS. Baseline: 18/80 or 22% functional (rated sole use of Lt arm) Goal status: IN PROGRESS   ASSESSMENT:  CLINICAL IMPRESSION: Pt demonstrating increased sustained attention this session with increased focus on tasks, intermittently looking out window or out door but able to return to task with min cues.  Pt demonstrating good effort during tasks and good recall of use of vision to compensate for impaired sensation.  OT encouraged engagement in games with family/children to continue to focus on functional use of LUE, coordination, and attention.   PLAN:  OT FREQUENCY: 2x/week  OT DURATION: 8 weeks  PLANNED INTERVENTIONS: self care/ADL training, therapeutic exercise, therapeutic activity, neuromuscular re-education, manual therapy, passive range of motion, functional mobility training, splinting, electrical stimulation, ultrasound, compression bandaging, moist heat, cryotherapy, patient/family education, cognitive remediation/compensation, visual/perceptual remediation/compensation, psychosocial skills training, energy conservation, coping strategies training, and DME and/or AE instructions  CONSULTED AND AGREED WITH PLAN OF CARE: Patient and family member/caregiver  PLAN FOR NEXT SESSION:  Check shoulder stretches and internal and external rotation as well as forearm supination stretches.  Assist pt in organizing exercises!  continue on with functional activities and managing tightness and nerve issues.  Continue education on safety due to L inattention and decreased attention to tasks which impact driving.  Explore various compression garments.   Jagger Demonte, OTR/L 02/27/2023, 11:59 AM

## 2023-03-01 ENCOUNTER — Ambulatory Visit: Payer: BC Managed Care – PPO | Admitting: Internal Medicine

## 2023-03-01 ENCOUNTER — Ambulatory Visit: Payer: BC Managed Care – PPO

## 2023-03-01 ENCOUNTER — Encounter: Payer: Self-pay | Admitting: Neurology

## 2023-03-01 ENCOUNTER — Ambulatory Visit: Payer: BC Managed Care – PPO | Admitting: Occupational Therapy

## 2023-03-01 DIAGNOSIS — R208 Other disturbances of skin sensation: Secondary | ICD-10-CM

## 2023-03-01 DIAGNOSIS — I69354 Hemiplegia and hemiparesis following cerebral infarction affecting left non-dominant side: Secondary | ICD-10-CM | POA: Diagnosis not present

## 2023-03-01 DIAGNOSIS — R278 Other lack of coordination: Secondary | ICD-10-CM | POA: Diagnosis not present

## 2023-03-01 DIAGNOSIS — R4701 Aphasia: Secondary | ICD-10-CM | POA: Diagnosis not present

## 2023-03-01 DIAGNOSIS — M6281 Muscle weakness (generalized): Secondary | ICD-10-CM

## 2023-03-01 DIAGNOSIS — R41841 Cognitive communication deficit: Secondary | ICD-10-CM | POA: Diagnosis not present

## 2023-03-01 DIAGNOSIS — R41840 Attention and concentration deficit: Secondary | ICD-10-CM | POA: Diagnosis not present

## 2023-03-01 NOTE — Therapy (Signed)
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   Depression    Epistaxis    Headache    Hypertension    PTSD (post-traumatic stress disorder)    Seizures (HCC)    Past Surgical History:  Procedure Laterality Date   BASAL CELL CARCINOMA EXCISION  1995   BRAIN SURGERY  12/29/2022   CRANIOTOMY FOR TUMOR Right 02/25/2011   IR ANGIO EXTERNAL CAROTID SEL EXT CAROTID BILAT MOD SED  11/16/2021   IR ANGIO INTRA EXTRACRAN SEL COM CAROTID INNOMINATE UNI R MOD SED  11/16/2021   IR ANGIO INTRA EXTRACRAN SEL INTERNAL CAROTID UNI L MOD SED  11/16/2021   IR ANGIO VERTEBRAL SEL VERTEBRAL UNI R MOD SED  11/16/2021   IR ANGIOGRAM FOLLOW UP STUDY  11/17/2021   IR NEURO EACH ADD'L AFTER BASIC UNI LEFT (MS)  11/17/2021   IR NEURO EACH ADD'L AFTER BASIC UNI RIGHT (MS)  11/17/2021   IR TRANSCATH/EMBOLIZ  11/16/2021   IR US GUIDE VASC ACCESS RIGHT  11/16/2021   RADIOLOGY WITH ANESTHESIA N/A 11/16/2021   Procedure: IR WITH ANESTHESIA;  Surgeon: Baldemar Lenis, MD;  Location: [facility] OR;  Service: Radiology;  Laterality: N/A;   TOTAL HIP ARTHROPLASTY Right 12/21/2021   Procedure: RIGHT TOTAL HIP REPLACEMENT;  Surgeon: Tarry Kos, MD;  Location: [facility] OR;  Service: Orthopedics;  Laterality: Right;  3-C   WISDOM TOOTH EXTRACTION     Patient Active Problem  List   Diagnosis Date Noted   Left hemiparesis (HCC) 02/19/2023   Hemisensory deficit 02/19/2023   Shoulder-hand syndrome 02/19/2023   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 02/08/2023   Depression with anxiety 01/11/2023   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   Coagulation defect, unspecified (HCC) 11/02/2022   Amnestic MCI (mild cognitive impairment with memory loss) 11/02/2022   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) 11/02/2022   Abnormal finding on MRI of brain 05/30/2022   Aneurysm of ascending aorta without rupture (HCC) 02/28/2022   Anemia due to acquired thiamine deficiency 12/01/2021   Seasonal allergic rhinitis due to pollen 11/08/2021   Family history of dissecting aortic aneurysm 11/08/2021   Contrast media allergy 11/08/2021   Avascular necrosis of bone of right hip (HCC) 11/02/2021   Avascular necrosis of bone of left hip (HCC) 11/02/2021   Primary hypertension 05/17/2021   Encounter for general adult medical examination with abnormal findings 05/17/2021   PTSD (post-traumatic stress disorder) 06/05/2018   Nightmares 09/27/2017   Brain tumor, glioma (HCC) 09/27/2017   Severe episode of recurrent major depressive disorder, without psychotic features (HCC)    Major depressive disorder, recurrent episode (HCC) 08/17/2016   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 He brought back cognitive PROM with everything filled in. He started 5 day on/23 day off cycle of  Temodar after [facility] visit last week. Pt accompanied by: self  PERTINENT HISTORY:  Patient is a 47 year old male with history of AVN of b-hips s/p R-THR, HTN, PTSD, depression w/anxiety, ascending aortic aneurysm, 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 Adline Peals with 70% removal of tumor. Metronomic Temodar initiated. He completed standard radiation with concurrent temozolomide and Avastin in 2013. MRI on 11/09/2022 suspicious for progression and repeat MRI done 4 weeks later found to have FLAIR lesion. He underwent repeat right temporal craniotomy for resection of tumor on 12/29/2022 by Dr. Lavonna Monarch at [facility]. This was complicated by bleeding from a branch of the MCA and post-op motor deficits of left side and left facial droop. Follow up MRI showed subjacent extra axial hemorrhage with layering blood products in right temporal lobe resection cavity with small volume SAH and trace cytotoxic edema in resection cavity. Therapy evaluations completed with recommendations for CIR. Patient admitted 01/06/23. SLP consult today due to reports of cognitive changes and biting is tongue when eating.   Pt's wife sent a message to pt's therapists via MyChart on 01/25/23 which stated: Hello Dr. Sundra Aland, this is Brendt's wife [name] and I wanted to write to you prior to our upcoming visit tomorrow to inform you of the current situation. Jurrell is having a hard time following through with simple tasks and it's causing a lot of stress in the home. He does not want to hear how important it is for him to do his PT from me, but he is not doing the exercises like he should. His depression is very severe and is aiding in the current situation at home. I wanted you to know ahead of time so I don't have to bring this up in front of him at our appointment.   PAIN:  Are you having pain? No  FALLS: Has patient fallen in last 6 months?  No  PATIENT  GOALS: Return to work  OBJECTIVE:   DIAGNOSTIC FINDINGS:  MR BRAIN WO CONTRAST December 12 2022 IMPRESSION: 1. Changes since 2020 MRI in the residual superior Right temporal gyrus highly suspicious for Tumor Recurrence, including new mass-like T2/FLAIR hyperintense gyral expansion and small new multifocal nodular enhancement there. No intracranial mass effect 2. Also new since 2020 but chronic appearing lacunar infarcts in the right basal ganglia. 3. No other acute intracranial abnormality.   MR BRAIN WO CONTRAST  Jan 17 2023 IMPRESSION: 1. 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 Previously seen nodular foci of contrast enhancement in the right temporal lobe are no longer visualized. 2. New T2/FLAIR hyperintense signal abnormality in the right corona radiata with associated curvilinear contrast enhancement in these regions. This could potentially represent a region of subacute infarct. Recommend attention on follow-up. 3. Small volume subarachnoid hemorrhage and postoperative subdural hematoma along the right cerebral convexity, as above. 4. 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 5. Dural enhancement and thickening along the left occipital convexity and likely the left temporal convexity, which may be due to CSF hypotension.  Discharge from SLP service:Jan 17, 2023 Patient has met 1 of 1 long term goals.  Patient to discharge at overall Modified Independent level.  Reasons goals not met: N/A  Clinical Impression/Discharge Summary: Patient has made functional gains and has met 1 of 1 LTGs this admission. Currently, patient requires extra processing time and is overall Mod I for recall of daily information with use of compensatory strategies. Patient  continues to report impaired sensation of his oral-motor musculature resulting in him biting his lips and tongue intermittently. However, his strength appears intact without evidence of dysarthria and with 100% intelligibility. Patient and family education is complete and patient will discharge home with assistance from family. Due to patient's high level of functioning and overall independence at baseline, f/u outpatient SLP services are recommended.  Care Partner:  Caregiver Able to Provide Assistance: Yes  Type of Caregiver Assistance: Cognitive;Physical Recommendation:  Outpatient SLP;24 hour supervision/assistance  Rationale for SLP Follow Up: Maximize cognitive function and independence;Reduce caregiver burden   PATIENT REPORTED OUTCOME MEASURES (PROM): Cognitive Function: returned 02/27/23 and pt scored 91/140, with higher scores indicating lesser impact of cognition on QOL.   TODAY'S TREATMENT:                                                                                                                                         DATE:   03/01/23: Pt brought 3-ring binder. Homework 3/5 completed. Pt told SLP he double checked homework but one response in the middle of the homework set was half completed. "I guess I didn't double check that one," Kandon stated. He has not obtained a pill box yet so SLP assisted pt in reasoning how he could ensure he would get one and fill it by next session. Pt put a reminder into his phone with min cue and extra time. SLP needed to provide cues for timing of alarm - pt had it set for a time when he may have already been home instead of shortly after rehab visits concluded. SLP asked therapeutic questions relating to anticipatory awareness with a home project and pt stated, "Wow, I'm starting to see how debilitating this is."    02/27/23:Pt acknowledged that the 3-ring binder is helping in keeping him better organized than the folder. Papers were hole-punched and  put in the binder. SLP inquired about pt's session Thursday and Vernie told SLP some points using the visual reminder that SLP had written for him. SLP reviewed mental energy/energy conservation with pt. Pt has not obtained a pillbox, states he will do that this week. Pt stated [facility] also initiated a low dose of Ritalin. Please see [facility] note dated 02/22/23 in  Care Everywhere for details on dosages. Today SLP assisted pt complete a simple but detailed functional task with simple executive function (time management of errands). Pt req'd min-mod cues for sequencing logically (left groceries in car for other errands), and for time to complete tasks. Homework for simple sequencing of written steps.  02/23/23: Pt was seen for skilled ST services targeting cognitive-communication. Provided edu on attention strategies re: managing fatigue to decrease cognitive load, managing distractions, brain breaks. SLP suggested pt get a pillbox to complete on Sundays for energy conservation as pt reports he takes his medicine every day. SLP and pt decided this would reduce pill bottle opening and increase efficiency. Pt is not consistently using alarms as discussed last session - pt reports he will continue to attempt to use this strategy to assist with time management.    02/20/23: Pt indicating challenges with time management. SLP A pt in generating solutions for improved time management and task initiation using his phone as an external aid. Reminders and alarm apps demonstrated with features such as reoccurrence and location. Given example task, pt able to verbalize how he would use external cognitive aid to optimize in 4/4 trials. Led pt through convergent naming task, 75% accuracy, benefiting from cues to consider all details to form response. SLP facilitated generative language task with pt demonstrating need for visual reminders to aid in ability to complete accurately. With visual support and verbal cues to ongoing maintenance  towards goal of completing 5, pt with 80% accuracy.   02/17/23: Very flat affect today. See "S" statement re: homework, phone, wallet. SLP asked for PROM which pt found in his therapy folder, not started. SLP told pt to complete for next session and asked him when that was - and pt discovered at this moment he forgot phone at home. Given "S" and pt leaving his phone and wallet at home, SLP took the initial part of the session to assist pt in thinking of a better system to improve organization and planning. Pt unable to generate any feasible ideas so SLP suggested memory notebook/binder to organize his therapy information and achieve better organization in general (see pt instructions). SLP targeted organization and he separated all handouts with extra time and rare min A. He stuffed these back into his folder again and SLP req'd to provide cues for a more effective organizational strategy - for the time being, clip them together. Pt to hole punch handouts and put in 3-ring binder this weekend.  No dysarthria observed in today's session. Consider providing lingual strength exercises as pt has complained of slurred speech and his tongue feeling "fat" in one previous session.  02/14/23: Pt brought homework folder today, however homework was unfinished as described in "s". Pt was unaware homework was unfinished.  He was 10 minutes late for initial session (OT), reported to SLP "My mom was pushing me out of the house, I was taking a birdbath." When asked about this pt demonstrated reduced anticipatory awareness and stated he "always" had difficulty with time management at work as well - started projects too late or would take more time that he was allotted to complete.  Today SLP cued pt to look at task prior to completing it to familarize himself with the task proir to jumping in. In these detailed functional tasks pt completed with 100% accuracy x2, but third task with 60% success (3/5 correct). He did not double  check answers, which was the initial "takeaway" SLP reiterated to pt about not completing  his homework. When asked how he could improve his accuracy he told SLP he should have double checked his answers.  02/10/23: Completed HEP but forgot at home. Denied difficulty as primary SLP assisted with initial game plan for execution. Reduced attention noted in conversation with pt fixating on computer screen. Attention mildly improved following SLP commenting on reduced attention and minimizing visual distractions. Targeted attention to detail task with following specific instruction and decoding message. Pt completed tasks x2 with pt independently noting errors mid-task which skewed result. Pt able to correct with additional time and mod A. Targeted metacognitive skills, in which pt unable to ID solution to aid performance. SLP prompted marking letters to aid attention and processing, in which pt stated "but that is so slow". Re-educated importance of slow rate to aid processing speed and attention to increase performance. Similar result occurred for second task. Pt verbally identified mistake but did not correct errors without prompt. Mild background noise (typing and voices in gym) was not impactful on performance. Pt reported tongue feels "fat" and reduced left facial sensation, which impacts intelligibility intermittently. Targeted velar sounds at word level for varied placements and increased number of syllables with rare errors noted. Benefited from slower rate for increased articulatory accuracy.   02/03/23: Pt did not return cognitive function PROM. SLP provided pt with targeted attention and organization tasks. Pt worked with detailed instrucutions and mathematical problems and did not double check his answers. Two errors were made which were not noticed by pt. SLP stressed to pt he will need to double check his answers now more than ever before. Pt said he would do so on remaining homework problems. SLP also  told pt to download MyChart app to track appointments.   02/01/23: SLP completed the CLQT and discussed results with pt. Organization, planning, memory, and attention appear to be pt's deficit areas for SLP to target. Results are above.  01/26/23: SLP discussed some compensations for mealtime as pt stated he is having difficulty feeling food on the lt labial margin during meals - setting a mirror up in front of him at meals was suggested.  Standardized eval results, as much as pt has completed, were also reviewed.  PATIENT EDUCATION: Education details: see "today's treatment" Person educated: Patient Education method: Explanation, Demonstration, and Verbal cues Education comprehension: verbalized understanding and needs further education   GOALS: Goals reviewed with patient? No  SHORT TERM GOALS: Target date: 03/03/23  Pt will demo alternating attention with min-mod complex tasks involving language with compensations 90%, in 3 sessions Baseline:02/27/23 Goal status: NOT MET  2.  Pt/family will report pt completes household tasks involving language for 30 consecutive minutes with compensations if necessary, in 3sessions Baseline:  Goal status: NOT MET  3.  Pt will demo use of cognitive (memory, planning/organization, attention, awareness) compensations when performing detailed language tasks in order to achieve 90% success, in 3 sessions Baseline:  Goal status: NOT MET  4.  Pt will demo compensations with POs for reported labial leakage due to facial numbness on lt (including upper/lower lips)  Baseline:  Goal Status: Deferred - Resolved   LONG TERM GOALS: Target date: 03/31/23  Pt will demo alternating attention with min-mod complex work-like tasks involving language (using interruptions) with compensations 100%, in 3 sessions Baseline:  Goal status: REVISED  2.  Pt/family will report pt completes household tasks involving language for 30 minutes with compensations if necessary, in 3  sessions Baseline:  Goal status: REVISED  3.  Pt will  demo knowledge of need to employ compensations when performing detailed language tasks in order to achieve 100% success, in 3 sessions Baseline:  Goal status: REVISED  4.  Pt will tell SLP 3 compensations he may need to use to improve accuracy with recall and/or attention Baseline:  Goal status: REVISED  5.  Pt will score higher on cognitive PROM in the last 1-2 sessions than on initial administration  Baseline:  Goal Status: IN PROGRESS  6. Pt will perform HEP for motor speech with modified independence in 3 sessions  Baseline:  Goal status: INITIAL   ASSESSMENT:  CLINICAL IMPRESSION: STGs checked today. Arash showed signs of improving awareness today in session. See "today's treatment" for more details of today's session. Pt did not meet any goals primarily due to severity/density of deficits. Patient is a 47 y.o. male who was seen today therapy targeting planning, organization, and attention. Pt's motor-speech will also cont to be targeted intermittently in ST - SLP added LTG for HEP for motor speech. Pt's cognition as it stands currently would be detrimental to his job security and pt would benefit from skilled ST to improve these skills.   OBJECTIVE IMPAIRMENTS: include attention, memory, awareness, and executive functioning. These impairments are limiting patient from return to work, managing medications, managing appointments, managing finances, household responsibilities, ADLs/IADLs, and effectively communicating at home and in community. Factors affecting potential to achieve goals and functional outcome are cooperation/participation level (according to wife, [name]). Patient will benefit from skilled SLP services to address above impairments and improve overall function.  REHAB POTENTIAL: Good  PLAN:  SLP FREQUENCY: 2x/week  SLP DURATION: 8 weeks (or 17 sessions)  PLANNED INTERVENTIONS: Language facilitation,  Environmental controls, Cueing hierachy, Cognitive reorganization, Internal/external aids, Functional tasks, SLP instruction and feedback, Compensatory strategies, and Patient/family education    [facility], CCC-SLP 03/01/2023, 1:39 PM

## 2023-03-01 NOTE — Patient Instructions (Signed)
Coordination/proprioception activities  Toss ball between hands. Toss ball in air and catch with the same hand. Bounce and catch large ball with both hands. Bounce and catch small ball with Left hand. Play "[place]" with balloon Roll ball in all directions on wall, attempt tapping of ball on wall

## 2023-03-01 NOTE — Therapy (Signed)
OUTPATIENT OCCUPATIONAL THERAPY NEURO TREATMENT NOTE  Patient Name: Shawn York MRN: [id number] DOB:Apr 17, 1976, 47 y.o., male Today's Date: 03/01/2023  PCP: Etta Grandchild, MD REFERRING PROVIDER: Milinda Antis, PA-C  END OF SESSION:  OT End of Session - 03/01/23 1152     Visit Number 10    Number of Visits 17    Date for OT Re-Evaluation 03/24/23    Authorization Type BC[employer]    OT Start Time 1150    OT Stop Time 1230    OT Time Calculation (min) 40 min    Activity Tolerance No increased pain;Patient tolerated treatment well;Patient limited by fatigue;Patient limited by pain    Behavior During Therapy [facility] for tasks assessed/performed                    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   Depression    Epistaxis    Headache    Hypertension    PTSD (post-traumatic stress disorder)    Seizures (HCC)    Past Surgical History:  Procedure Laterality Date   BASAL CELL CARCINOMA EXCISION  1995   BRAIN SURGERY  12/29/2022   CRANIOTOMY FOR TUMOR Right 02/25/2011   IR ANGIO EXTERNAL CAROTID SEL EXT CAROTID BILAT MOD SED  11/16/2021   IR ANGIO INTRA EXTRACRAN SEL COM CAROTID INNOMINATE UNI R MOD SED  11/16/2021   IR ANGIO INTRA EXTRACRAN SEL INTERNAL CAROTID UNI L MOD SED  11/16/2021   IR ANGIO VERTEBRAL SEL VERTEBRAL UNI R MOD SED  11/16/2021   IR ANGIOGRAM FOLLOW UP STUDY  11/17/2021   IR NEURO EACH ADD'L AFTER BASIC UNI LEFT (MS)  11/17/2021   IR NEURO EACH ADD'L AFTER BASIC UNI RIGHT (MS)  11/17/2021   IR TRANSCATH/EMBOLIZ  11/16/2021   IR US GUIDE VASC ACCESS RIGHT  11/16/2021   RADIOLOGY WITH ANESTHESIA N/A 11/16/2021   Procedure: IR WITH ANESTHESIA;  Surgeon: Baldemar Lenis, MD;  Location: [facility] OR;  Service: Radiology;  Laterality: N/A;   TOTAL HIP ARTHROPLASTY Right 12/21/2021   Procedure: RIGHT TOTAL HIP REPLACEMENT;  Surgeon: Tarry Kos, MD;  Location: [facility] OR;  Service: Orthopedics;  Laterality: Right;  3-C   WISDOM TOOTH EXTRACTION     Patient Active Problem List   Diagnosis Date Noted   Left hemiparesis (HCC) 02/19/2023   Hemisensory deficit 02/19/2023   Shoulder-hand syndrome 02/19/2023   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 02/08/2023   Depression with anxiety 01/11/2023   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   Coagulation defect, unspecified (HCC) 11/02/2022   Amnestic MCI (mild cognitive impairment with memory loss) 11/02/2022   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) 11/02/2022   Abnormal finding on MRI of brain 05/30/2022   Aneurysm of ascending aorta without rupture (HCC) 02/28/2022   Anemia due to acquired thiamine deficiency 12/01/2021   Seasonal allergic rhinitis due to pollen 11/08/2021   Family history of dissecting aortic aneurysm 11/08/2021   Contrast media allergy 11/08/2021   Avascular necrosis of bone of right hip (HCC) 11/02/2021   Avascular necrosis of bone of left hip (HCC) 11/02/2021   Primary hypertension 05/17/2021   Encounter for general adult medical examination with abnormal findings 05/17/2021   PTSD (post-traumatic stress disorder) 06/05/2018   Nightmares 09/27/2017   Brain tumor, glioma (HCC) 09/27/2017   Severe  episode of recurrent major depressive disorder, without psychotic features (HCC)    Major depressive disorder, recurrent episode (HCC) 08/17/2016   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    ONSET DATE: 12/29/22 DOS craniotomy and tumor resection   REFERRING DIAG: C71.2 (ICD-10-CM) - Anaplastic oligodendroglioma of temporal lobe (HCC) I63.112 (ICD-10-CM) - 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  THERAPY DIAG:  Hemiplegia and hemiparesis following cerebral infarction affecting left non-dominant side (HCC)  Other lack of coordination  Muscle  weakness (generalized)  Other disturbances of skin sensation  Rationale for Evaluation and Treatment: Rehabilitation  PERTINENT HISTORY: 47 year old male with history of AVN of b-hips s/p R-THR, HTN, PTSD, depression w/anxiety, ascending aortic aneurysm, 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 Adline Peals with 70% removal of tumor. Metronomic Temodar initiated. He completed standard radiation with concurrent temozolomide and Avastin in 2013. MRI on 11/09/2022 suspicious for progression and repeat MRI done 4 weeks later found to have FLAIR lesion. He underwent repeat right temporal craniotomy for resection of tumor on 12/29/2022 by Dr. Lavonna Monarch at [facility]. This was complicated by bleeding from a branch of the MCA and post-op motor deficits of left side and left facial droop. Follow up MRI showed subjacent extra axial hemorrhage with layering blood products in right temporal lobe resection cavity with small volume SAH and trace cytotoxic edema in resection cavity.  "This (point to L) arm is a big challenge.  I will tell it to do something and it won't." Reports decreased sensation, limited FMC, limited ability to grade pressure.  Spouse reports that when attempting to complete bimanual tasks, LUE may start task with setup but will not complete task as RUE.  PRECAUTIONS: no lifting or strenuous exercise until cleared by MD  WEIGHT BEARING RESTRICTIONS: No   SUBJECTIVE:   SUBJECTIVE STATEMENT: Pt reports not finishing Speech therapy homework, due to having kitchen appliance issues.  Pt reports [employer] contacted him and is sending him their device.  Pt accompanied by: self, mother  PAIN:  Are you having pain? Yes: NPRS scale: not rated/10 Pain description: throbbing Pain location: Lt hand feels cold and mild nerve pain  FALLS: Has patient fallen in last 6 months? No  LIVING ENVIRONMENT: Lives with: lives  with their family (lives with spouse, 47 yo son and 76 yo daughter) Lives in: House/apartment Stairs: Yes: External: 3 steps; FIL can built hand rails Has following equipment at home: Single point cane, Environmental consultant - 2 wheeled, Marine scientist  PLOF: Independent, Independent with basic ADLs, and Vocation/Vocational requirements: [employer]  - unload trucks, break down pallets, Conservation officer, nature  PATIENT GOALS: to be able to play guitar again, not drop things   OBJECTIVE: (All objective assessments below are from initial evaluation on: 01/26/23 unless otherwise specified.)   HAND DOMINANCE: Right  ADLs: Transfers/ambulation related to ADLs: Mod I without AD Eating: would need assist to cut meat, knocks items off table with L hand (use of 2 hands to stabilize and scoop) Grooming: utilizing LUE as gross assist to hold items but not utilizing during task UB Dressing: increased effort LB Dressing: increased effort, attempting to utilize LUE however it will frequently drop things. Difficulty with tying shoes/draw string and buttoning  Toileting: increased time/effort when tearing toilet paper Bathing: difficulty with use of LUE when washing R side of body Tub Shower transfers: Mod I stepping over tub ledge Equipment: Transfer tub bench  IADLs: Light housekeeping: attempted to feed dogs and dropped bowl from L hand Meal Prep: not currently cooking Community mobility: not cleared to drive Medication management: pt has a system and is able to remember to take as prescribed, difficulty with opening pill bottles  MOBILITY STATUS: Independent  POSTURE COMMENTS:  No Significant postural limitations  ACTIVITY TOLERANCE: Activity tolerance: WFL for tasks assessed  FUNCTIONAL OUTCOME MEASURES: Upper Extremity Functional Scale (UEFS): 18/80 or 22% functional (rated sole use of Lt arm)  UPPER EXTREMITY ROM:  RUE: WFL; LUE: Has active movement - can reach overhead and internal rotation with increased  time/effort - difficulty sustaining, and increased compensations noted with fatigue   UPPER EXTREMITY MMT:   grossly 4+ to 5/5 bilaterally  HAND FUNCTION: 02/01/23: Grip: Rt: 125#, Lt: 53#  COORDINATION: Box and Blocks:  Right 42 blocks, Left 12 blocks 02/14/23: L: 20 blocks  SENSATION: 02/01/23: OT tries Semmes-Weinstein Testing and he cannot perceive 6.65 (largest) most of the time globally in Lt hand, and so had diminished or no protective sensation and was cautioned about that.   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  Pt continues to demonstrate decreased L shoulder flexion and reaching with upward diagonal on L, therefore modified to reach towards shoulder.  Pt demonstrating good effort with task and receptive to education on functional carryover. Proprioception tasks: engaged in ball toss R<> L hand, ipsilateral L hand, and ball rolling on wall (attempted tapping).  Pt demonstrating difficulty with grading of pressure with tossing of ball especially when tossing/catching with L hand as well as attempts at  tapping ball on wall.  Encouraged pt to utilize variety of sizes and weights of balls with above tasks and recommended use of balloon or beach ball due to decreased reliability with tosses requiring increased awareness of body in space.   02/27/23 Coordination: attempted picking up and placing small grooved pegs to further assess difficulties with attempts at housekeeping projects.  Pt able to pick up and place 4 pegs with significantly increased time, largely due to impaired sensation.  OT providing cues to visually attend to LUE during structured tasks, especially to compensate for impaired sensation.  Transitioned to screw/unscrew nuts/bolts with focus on motor control and attention to UE during task. NMR: engaged in WB in quadruped with WB through elbows and forearms due to discomfort when WB through L hand.  Engaged in alternating reaching and WB when picking up pegs with alternating UE.  Pt able to complete large and  medium pegs, attempting small pegs with significant increased challenge.  OT drawing attention to motor control with LUE vs RUE and cues to decrease shoulder and elbow compensatory movements when placing pegs.  OT applied wash cloth under pegs to provide contrast of textures when picking up pegs - pt with increased success with addition of wash cloth. Shoulder ROM: engaged in shoulder flexion and abduction with use of large therapy ball to further facilitate ROM and stretching at end range.  Pt reports tightness in shoulder prior to ROM, tolerating ROM.    02/23/23 Kinesiotape: OT applied kinesiotape to L hand and forearm, anchored at lateral epicondyle and applied to forearm with 20-35% tension past DIP to facilitate edema management. Reviewed typical length of wear and purpose of kinesiotape.  Reviewed visual compensatory strategies: pt did report that he continues to bump into door walking in to laundry room and L arm will get caught.  OT reiterated visual scanning/head turns and/or  brining LUE in front when walking through doorway. NMR: OT reviewed weightbearing exercises through "developmental postures" for NMR with crunch and extend with all extremities, superman for extension, and weightbearing and prone on elbows with weight shifts and scapular prone retraction. Pt with increased tolerance with pushing up onto hands and knees into quadruped position.  Engaged in reaching with RUE to facilitate increased WB through LUE.  OT educated on child's pose to provide stretch and relaxation of LUE as pt with reports of tightness in L wrist with prolonged extension.  The entire time he was encouraged to breath through exercises. Reviewed prayer stretch, radial and median nerve glides, and sleeper stretch in sidelying.  OT providing demonstration and cues for sleeper stretch due to initial external rotation vs internal rotation per instructions.   PATIENT EDUCATION: Education details: ongoing condition specific education.  Educated on sensation and proprioceptive input. Person educated: Patient Education method: Explanation, Demonstration, and Handouts Education comprehension: verbalized understanding and needs further education  HOME EXERCISE PROGRAM: Developmental Motor Sequence (Rood) Do each 2-3 x day, don't hold breath, slowly, 5-10x each Crunches with arms and legs (Flexor-withdrawl) Roll over (toward unaffected side)  "Superman" extension exercises (Pivot prone)       4. Push up/down and Rock side/side on elbows       5. Push up to get to hands and knees   Access Code: [email or web address] URL: [email or web address] Date: 02/03/2023 Prepared by: [facility] - Outpatient  Rehab - [facility]  Access Code: [email or web address] URL: [email or web address] Date: 02/17/2023 Prepared by: [facility] - Outpatient  Rehab - [facility]   GOALS: Goals reviewed with patient? Yes  SHORT TERM GOALS: Target date: 02/24/23  Pt will be independent in HEP for LUE  coordination and ROM. Baseline: Goal status: MET - 02/23/23  2.  Pt will verbalize understanding of task modifications and/or potential AE needs to increase ease, safety, and independence w/ ADLs. Baseline:  Goal status: MET - 02/23/23  3.  Patient will demonstrate improved awareness of visual management strategies for L inattention. Baseline:  Goal status: IN PROGRESS  4.  Pt will demonstrate improved UE functional use for ADLs as evidenced by increasing box/ blocks score by 4 blocks with LUE Baseline: Box and Blocks:  Right 42 blocks, Left 12 blocks Goal status: MET - L: 20 blocks on 02/14/23   LONG TERM GOALS: Target date: 03/24/23  Pt will demonstrate improved UE functional use for ADLs as evidenced by increasing box/ blocks score by 8 blocks with LUE Baseline: Box and Blocks:  Right 42 blocks, Left 12 blocks Goal status: IN PROGRESS  2.  Pt will demonstrate improved fine motor coordination for ADLs as evidenced by being able to complete 9 hole peg test with LUE in 2 min time limit. Baseline:  Goal status: IN PROGRESS  3.  Pt will demonstrate improved awareness of safety considerations and AE needs to ensure safety d/t decreased sensation Baseline:  Goal status: IN PROGRESS  4.  Pt will demonstrate improved attention to LUE to allow for sustained grasp to transport household items 25' to increase engagement in IADLs. Baseline:  Goal status: IN PROGRESS  5.  Pt will demonstrate improved functional use of LUE as evidenced by improved score by TBD on UEFS. Baseline: 18/80 or 22% functional (rated sole use of Lt arm) Goal status: IN PROGRESS   ASSESSMENT:  CLINICAL IMPRESSION: Pt demonstrating increased sustained attention this session with increased focus on tasks, intermittently looking out window or out door but able to return to task without cues.  Pt demonstrating good effort during tasks and good recall of use of vision to compensate for impaired sensation.  OT encouraged  engagement in games with family/children to continue to focus on functional use of LUE, coordination, and attention as well as incorporating increased focus on proprioception.  OT reviewed WB  as strategy prior to ball toss/bounce tasks to increase awareness of LUE during mobility/tasks. PLAN:  OT FREQUENCY: 2x/week  OT DURATION: 8 weeks  PLANNED INTERVENTIONS: self care/ADL training, therapeutic exercise, therapeutic activity, neuromuscular re-education, manual therapy, passive range of motion, functional mobility training, splinting, electrical stimulation, ultrasound, compression bandaging, moist heat, cryotherapy, patient/family education, cognitive remediation/compensation, visual/perceptual remediation/compensation, psychosocial skills training, energy conservation, coping strategies training, and DME and/or AE instructions  CONSULTED AND AGREED WITH PLAN OF CARE: Patient and family member/caregiver  PLAN FOR NEXT SESSION:  Check shoulder stretches and internal and external rotation as well as forearm supination stretches.  continue on with functional activities and managing tightness and nerve issues.  Continue education on safety due to L inattention and decreased attention to tasks which impact driving.  Explore various compression garments.   Rosalio Loud, OTR/L 03/01/2023, 11:52 AM

## 2023-03-04 DIAGNOSIS — R9089 Other abnormal findings on diagnostic imaging of central nervous system: Secondary | ICD-10-CM | POA: Diagnosis not present

## 2023-03-04 DIAGNOSIS — I69014 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: Secondary | ICD-10-CM | POA: Diagnosis not present

## 2023-03-06 ENCOUNTER — Ambulatory Visit: Payer: BC Managed Care – PPO

## 2023-03-06 DIAGNOSIS — I69354 Hemiplegia and hemiparesis following cerebral infarction affecting left non-dominant side: Secondary | ICD-10-CM | POA: Diagnosis not present

## 2023-03-06 DIAGNOSIS — R41840 Attention and concentration deficit: Secondary | ICD-10-CM | POA: Diagnosis not present

## 2023-03-06 DIAGNOSIS — R4701 Aphasia: Secondary | ICD-10-CM | POA: Diagnosis not present

## 2023-03-06 DIAGNOSIS — R41841 Cognitive communication deficit: Secondary | ICD-10-CM

## 2023-03-06 DIAGNOSIS — M6281 Muscle weakness (generalized): Secondary | ICD-10-CM | POA: Diagnosis not present

## 2023-03-06 DIAGNOSIS — R208 Other disturbances of skin sensation: Secondary | ICD-10-CM | POA: Diagnosis not present

## 2023-03-06 DIAGNOSIS — R278 Other lack of coordination: Secondary | ICD-10-CM | POA: Diagnosis not present

## 2023-03-06 NOTE — Therapy (Signed)
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   Depression    Epistaxis    Headache    Hypertension    PTSD (post-traumatic stress disorder)    Seizures (HCC)    Past Surgical History:  Procedure Laterality Date   BASAL CELL CARCINOMA EXCISION  1995   BRAIN SURGERY  12/29/2022   CRANIOTOMY FOR TUMOR Right 02/25/2011   IR ANGIO EXTERNAL CAROTID SEL EXT CAROTID BILAT MOD SED  11/16/2021   IR ANGIO INTRA EXTRACRAN SEL COM CAROTID INNOMINATE UNI R MOD SED  11/16/2021   IR ANGIO INTRA EXTRACRAN SEL INTERNAL CAROTID UNI L MOD SED  11/16/2021   IR ANGIO VERTEBRAL SEL VERTEBRAL UNI R MOD SED  11/16/2021   IR ANGIOGRAM FOLLOW UP STUDY  11/17/2021   IR NEURO EACH ADD'L AFTER BASIC UNI LEFT (MS)  11/17/2021   IR NEURO EACH ADD'L AFTER BASIC UNI RIGHT (MS)  11/17/2021   IR TRANSCATH/EMBOLIZ  11/16/2021   IR US GUIDE VASC ACCESS RIGHT  11/16/2021   RADIOLOGY WITH ANESTHESIA N/A 11/16/2021   Procedure: IR WITH ANESTHESIA;  Surgeon: Shawn Lenis, MD;  Location: [facility] OR;  Service: Radiology;  Laterality: N/A;   TOTAL HIP ARTHROPLASTY Right 12/21/2021   Procedure: RIGHT TOTAL HIP REPLACEMENT;  Surgeon: Shawn Kos, MD;  Location: [facility] OR;  Service: Orthopedics;  Laterality: Right;  3-C   WISDOM TOOTH EXTRACTION     Patient Active Problem  List   Diagnosis Date Noted   Left hemiparesis (HCC) 02/19/2023   Hemisensory deficit 02/19/2023   Shoulder-hand syndrome 02/19/2023   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 02/08/2023   Depression with anxiety 01/11/2023   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   Coagulation defect, unspecified (HCC) 11/02/2022   Amnestic MCI (mild cognitive impairment with memory loss) 11/02/2022   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) 11/02/2022   Abnormal finding on MRI of brain 05/30/2022   Aneurysm of ascending aorta without rupture (HCC) 02/28/2022   Anemia due to acquired thiamine deficiency 12/01/2021   Seasonal allergic rhinitis due to pollen 11/08/2021   Family history of dissecting aortic aneurysm 11/08/2021   Contrast media allergy 11/08/2021   Avascular necrosis of bone of right hip (HCC) 11/02/2021   Avascular necrosis of bone of left hip (HCC) 11/02/2021   Primary hypertension 05/17/2021   Encounter for general adult medical examination with abnormal findings 05/17/2021   PTSD (post-traumatic stress disorder) 06/05/2018   Nightmares 09/27/2017   Brain tumor, glioma (HCC) 09/27/2017   Severe episode of recurrent major depressive disorder, without psychotic features (HCC)    Major depressive disorder, recurrent episode (HCC) 08/17/2016   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 Begins Temodar this week. Pt accompanied by: self  PERTINENT HISTORY:  Patient is a 47 year old  male with history of AVN of b-hips s/p R-THR, HTN, PTSD, depression w/anxiety, ascending aortic aneurysm, anaplastic oligodendroglioma diagnosed in May of 2012 and underwent right temporal craniotomy by Shawn York with 70% removal of tumor. Metronomic Temodar initiated. He completed standard radiation with concurrent temozolomide and Avastin in 2013. MRI on 11/09/2022 suspicious for progression and repeat MRI done 4 weeks later found to have FLAIR lesion. He underwent repeat right temporal craniotomy for resection of tumor on 12/29/2022 by Shawn York at [facility]. This was complicated by bleeding from a branch of the MCA and post-op motor deficits of left side and left facial droop. Follow up MRI showed subjacent extra axial hemorrhage with layering blood products in right temporal lobe resection cavity with small volume SAH and trace cytotoxic edema in resection cavity. Therapy evaluations completed with recommendations for CIR. Patient admitted 01/06/23. SLP consult today due to reports of cognitive changes and biting is tongue when eating.   Pt's wife sent a message to pt's therapists via MyChart on 01/25/23 which stated: Hello Dr. Sundra York, this is Shawn York's wife Shawn York and I wanted to write to you prior to our upcoming visit tomorrow to inform you of the current situation. Shawn York is having a hard time following through with simple tasks and it's causing a lot of stress in the home. He does not want to hear how important it is for him to do his PT from me, but he is not doing the exercises like he should. His depression is very severe and is aiding in the current situation at home. I wanted you to know ahead of time so I don't have to bring this up in front of him at our appointment.   PAIN:  Are you having pain? No  FALLS: Has patient fallen in last 6 months?  No  PATIENT GOALS: Return to work  OBJECTIVE:   DIAGNOSTIC FINDINGS:  MR BRAIN WO CONTRAST December 12 2022 IMPRESSION: 1. Changes since 2020 MRI in the residual superior Right temporal gyrus highly suspicious for Tumor Recurrence, including new mass-like T2/FLAIR hyperintense gyral expansion and small new multifocal nodular enhancement there. No intracranial mass effect 2. Also new since 2020 but chronic appearing lacunar infarcts in the right basal ganglia. 3. No other acute intracranial abnormality.   MR BRAIN WO CONTRAST  Jan 17 2023 IMPRESSION: 1. 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 Previously seen nodular foci of contrast enhancement in the right temporal lobe are no longer visualized. 2. New T2/FLAIR hyperintense signal abnormality in the right corona radiata with associated curvilinear contrast enhancement in these regions. This could potentially represent a region of subacute infarct. Recommend attention on follow-up. 3. Small volume subarachnoid hemorrhage and postoperative subdural hematoma along the right cerebral convexity, as above. 4. 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 5. Dural enhancement  and thickening along the left occipital convexity and likely the left temporal convexity, which may be due to CSF hypotension.  Discharge from SLP service:Jan 17, 2023 Patient has met 1 of 1 long term goals.  Patient to discharge at overall Modified Independent level.  Reasons goals not met: N/A  Clinical Impression/Discharge Summary: Patient has made functional gains and has met 1 of 1 LTGs this admission. Currently, patient requires extra processing time and is overall Mod I for recall of daily information with use of compensatory strategies. Patient continues to report impaired sensation of his oral-motor musculature resulting in him biting  his lips and tongue intermittently. However, his strength appears intact without evidence of dysarthria and with 100% intelligibility. Patient and family education is complete and patient will discharge home with assistance from family. Due to patient's high level of functioning and overall independence at baseline, f/u outpatient SLP services are recommended.  Care Partner:  Caregiver Able to Provide Assistance: Yes  Type of Caregiver Assistance: Cognitive;Physical Recommendation:  Outpatient SLP;24 hour supervision/assistance  Rationale for SLP Follow Up: Maximize cognitive function and independence;Reduce caregiver burden   PATIENT REPORTED OUTCOME MEASURES (PROM): Cognitive Function: returned 02/27/23 and pt scored 91/140, with higher scores indicating lesser impact of cognition on QOL.   TODAY'S TREATMENT:                                                                                                                                         DATE:   03/06/23: Pt's last page of homework not completed. Tells SLP he double checked his answers however the last two responses on the previous page are not completed either.  Pt tells SLP he forgot a step in replacement of bathroom vanity now requiring him to buy a wrench to remove valves on water lines. SLP worked with pt for a functional compensation and pt agreed, after SLP explanation, that writing out a checklist for remaining steps would be helpful and pt did so with good success. 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  Pt bought pill box after last session and filled on his own. He did not have anyone double check his accuracy although he states he is chdcking them prior to taking them. SLP told pt to have wife double check his accuracy each time he fills for the next 3-4 weeks, at least.   03/01/23: Pt brought 3-ring binder. Homework 3/5 completed. Pt told  SLP he double checked homework but one response in the middle of the homework set was half completed. "I guess I didn't double check that one," Revere stated. He has not obtained a pill box yet so SLP assisted pt in reasoning how he could ensure he would get one and fill it by next session. Pt put a reminder into his phone with min cue and  extra time. SLP needed to provide cues for timing of alarm - pt had it set for a time when he may have already been home instead of shortly after rehab visits concluded. SLP asked therapeutic questions relating to anticipatory awareness with a home project and pt stated, "Wow, I'm starting to see how debilitating this is."    02/27/23:Pt acknowledged that the 3-ring binder is helping in keeping him better organized than the folder. Papers were hole-punched and put in the binder. SLP inquired about pt's session Thursday and Japeth told SLP some points using the visual reminder that SLP had written for him. SLP reviewed mental energy/energy conservation with pt. Pt has not obtained a pillbox, states he will do that this week. Pt stated [facility] also initiated a low dose of Ritalin. Please see [facility] note dated 02/22/23 in Care Everywhere for details on dosages. Today SLP assisted pt complete a simple but detailed functional task with simple executive function (time management of errands). Pt req'd min-mod cues for sequencing logically (left groceries in car for other errands), and for time to complete tasks. Homework for simple sequencing of written steps.  02/23/23: Pt was seen for skilled ST services targeting cognitive-communication. Provided edu on attention strategies re: managing fatigue to decrease cognitive load, managing distractions, brain breaks. SLP suggested pt get a pillbox to complete on Sundays for energy conservation as pt reports he takes his medicine every day. SLP and pt decided this would reduce pill bottle opening and increase efficiency. Pt is not consistently using  alarms as discussed last session - pt reports he will continue to attempt to use this strategy to assist with time management.    02/20/23: Pt indicating challenges with time management. SLP A pt in generating solutions for improved time management and task initiation using his phone as an external aid. Reminders and alarm apps demonstrated with features such as reoccurrence and location. Given example task, pt able to verbalize how he would use external cognitive aid to optimize in 4/4 trials. Led pt through convergent naming task, 75% accuracy, benefiting from cues to consider all details to form response. SLP facilitated generative language task with pt demonstrating need for visual reminders to aid in ability to complete accurately. With visual support and verbal cues to ongoing maintenance towards goal of completing 5, pt with 80% accuracy.   02/17/23: Very flat affect today. See "S" statement re: homework, phone, wallet. SLP asked for PROM which pt found in his therapy folder, not started. SLP told pt to complete for next session and asked him when that was - and pt discovered at this moment he forgot phone at home. Given "S" and pt leaving his phone and wallet at home, SLP took the initial part of the session to assist pt in thinking of a better system to improve organization and planning. Pt unable to generate any feasible ideas so SLP suggested memory notebook/binder to organize his therapy information and achieve better organization in general (see pt instructions). SLP targeted organization and he separated all handouts with extra time and rare min A. He stuffed these back into his folder again and SLP req'd to provide cues for a more effective organizational strategy - for the time being, clip them together. Pt to hole punch handouts and put in 3-ring binder this weekend.  No dysarthria observed in today's session. Consider providing lingual strength exercises as pt has complained of slurred speech  and his tongue feeling "fat" in one previous session.  02/14/23: Pt  brought homework folder today, however homework was unfinished as described in "s". Pt was unaware homework was unfinished.  He was 10 minutes late for initial session (OT), reported to SLP "My mom was pushing me out of the house, I was taking a birdbath." When asked about this pt demonstrated reduced anticipatory awareness and stated he "always" had difficulty with time management at work as well - started projects too late or would take more time that he was allotted to complete.  Today SLP cued pt to look at task prior to completing it to familarize himself with the task proir to jumping in. In these detailed functional tasks pt completed with 100% accuracy x2, but third task with 60% success (3/5 correct). He did not double check answers, which was the initial "takeaway" SLP reiterated to pt about not completing his homework. When asked how he could improve his accuracy he told SLP he should have double checked his answers.  02/10/23: Completed HEP but forgot at home. Denied difficulty as primary SLP assisted with initial game plan for execution. Reduced attention noted in conversation with pt fixating on computer screen. Attention mildly improved following SLP commenting on reduced attention and minimizing visual distractions. Targeted attention to detail task with following specific instruction and decoding message. Pt completed tasks x2 with pt independently noting errors mid-task which skewed result. Pt able to correct with additional time and mod A. Targeted metacognitive skills, in which pt unable to ID solution to aid performance. SLP prompted marking letters to aid attention and processing, in which pt stated "but that is so slow". Re-educated importance of slow rate to aid processing speed and attention to increase performance. Similar result occurred for second task. Pt verbally identified mistake but did not correct errors without  prompt. Mild background noise (typing and voices in gym) was not impactful on performance. Pt reported tongue feels "fat" and reduced left facial sensation, which impacts intelligibility intermittently. Targeted velar sounds at word level for varied placements and increased number of syllables with rare errors noted. Benefited from slower rate for increased articulatory accuracy.   02/03/23: Pt did not return cognitive function PROM. SLP provided pt with targeted attention and organization tasks. Pt worked with detailed instrucutions and mathematical problems and did not double check his answers. Two errors were made which were not noticed by pt. SLP stressed to pt he will need to double check his answers now more than ever before. Pt said he would do so on remaining homework problems. SLP also told pt to download MyChart app to track appointments.   02/01/23: SLP completed the CLQT and discussed results with pt. Organization, planning, memory, and attention appear to be pt's deficit areas for SLP to target. Results are above.  01/26/23: SLP discussed some compensations for mealtime as pt stated he is having difficulty feeling food on the lt labial margin during meals - setting a mirror up in front of him at meals was suggested.  Standardized eval results, as much as pt has completed, were also reviewed.  PATIENT EDUCATION: Education details: see "today's treatment" Person educated: Patient Education method: Explanation, Demonstration, and Verbal cues Education comprehension: verbalized understanding and needs further education   GOALS: Goals reviewed with patient? No  SHORT TERM GOALS: Target date: 03/03/23  Pt will demo alternating attention with min-mod complex tasks involving language with compensations 90%, in 3 sessions Baseline:02/27/23 Goal status: NOT MET  2.  Pt/family will report pt completes household tasks involving language for 30 consecutive minutes  with compensations if necessary, in  3sessions Baseline:  Goal status: NOT MET  3.  Pt will demo use of cognitive (memory, planning/organization, attention, awareness) compensations when performing detailed language tasks in order to achieve 90% success, in 3 sessions Baseline:  Goal status: NOT MET  4.  Pt will demo compensations with POs for reported labial leakage due to facial numbness on lt (including upper/lower lips)  Baseline:  Goal Status: Deferred - Resolved   LONG TERM GOALS: Target date: 03/31/23  Pt will demo alternating attention with min-mod complex work-like tasks involving language (using interruptions) with compensations 100%, in 3 sessions Baseline:  Goal status: REVISED  2.  Pt/family will report pt completes household tasks involving language for 30 minutes with compensations if necessary, in 3 sessions Baseline:  Goal status: REVISED  3.  Pt will demo knowledge of need to employ compensations when performing detailed language tasks in order to achieve 100% success, in 3 sessions Baseline:  Goal status: REVISED  4.  Pt will tell SLP 3 compensations he may need to use to improve accuracy with recall and/or attention Baseline:  Goal status: REVISED  5.  Pt will score higher on cognitive PROM in the last 1-2 sessions than on initial administration  Baseline:  Goal Status: IN PROGRESS  6. Pt will perform HEP for motor speech with modified independence in 3 sessions  Baseline:  Goal status: INITIAL   ASSESSMENT:  CLINICAL IMPRESSION: Makarios showed signs of improving awareness today in session, however reports he makes mistakes with accuracy at home (See "today's treatment" for more details of today's session). Patient is a 47 y.o. male who was seen today therapy targeting planning, organization, and attention. Pt's motor-speech will also cont to be targeted intermittently in ST - SLP added LTG for HEP for motor speech. Pt's cognition as it stands currently would be detrimental to his job security  and pt would benefit from skilled ST to improve these skills.   OBJECTIVE IMPAIRMENTS: include attention, memory, awareness, and executive functioning. These impairments are limiting patient from return to work, managing medications, managing appointments, managing finances, household responsibilities, ADLs/IADLs, and effectively communicating at home and in community. Factors affecting potential to achieve goals and functional outcome are cooperation/participation level (according to wife, Shawn York). Patient will benefit from skilled SLP services to address above impairments and improve overall function.  REHAB POTENTIAL: Good  PLAN:  SLP FREQUENCY: 2x/week  SLP DURATION: 8 weeks (or 17 sessions)  PLANNED INTERVENTIONS: Language facilitation, Environmental controls, Cueing hierachy, Cognitive reorganization, Internal/external aids, Functional tasks, SLP instruction and feedback, Compensatory strategies, and Patient/family education    [facility], CCC-SLP 03/06/2023, 11:34 AM

## 2023-03-07 NOTE — Therapy (Signed)
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   Depression    Epistaxis    Headache    Hypertension    PTSD (post-traumatic stress disorder)    Seizures (HCC)    Past Surgical History:  Procedure Laterality Date   BASAL CELL CARCINOMA EXCISION  1995   BRAIN SURGERY  12/29/2022   CRANIOTOMY FOR TUMOR Right 02/25/2011   IR ANGIO EXTERNAL CAROTID SEL EXT CAROTID BILAT MOD SED  11/16/2021   IR ANGIO INTRA EXTRACRAN SEL COM CAROTID INNOMINATE UNI R MOD SED  11/16/2021   IR ANGIO INTRA EXTRACRAN SEL INTERNAL CAROTID UNI L MOD SED  11/16/2021   IR ANGIO VERTEBRAL SEL VERTEBRAL UNI R MOD SED  11/16/2021   IR ANGIOGRAM FOLLOW UP STUDY  11/17/2021   IR NEURO EACH ADD'L AFTER BASIC UNI LEFT (MS)  11/17/2021   IR NEURO EACH ADD'L AFTER BASIC UNI RIGHT (MS)  11/17/2021   IR TRANSCATH/EMBOLIZ  11/16/2021   IR US GUIDE VASC ACCESS RIGHT  11/16/2021   RADIOLOGY WITH ANESTHESIA N/A 11/16/2021   Procedure: IR WITH ANESTHESIA;  Surgeon: Baldemar Lenis, MD;  Location: [facility] OR;  Service: Radiology;  Laterality: N/A;   TOTAL HIP ARTHROPLASTY Right 12/21/2021   Procedure: RIGHT TOTAL HIP REPLACEMENT;  Surgeon: Tarry Kos, MD;  Location: [facility] OR;  Service: Orthopedics;  Laterality: Right;  3-C   WISDOM TOOTH EXTRACTION     Patient Active Problem List   Diagnosis Date Noted   Left hemiparesis (HCC) 02/19/2023   Hemisensory deficit 02/19/2023   Shoulder-hand syndrome 02/19/2023   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 02/08/2023   Depression with anxiety 01/11/2023    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   Coagulation defect, unspecified (HCC) 11/02/2022   Amnestic MCI (mild cognitive impairment with memory loss) 11/02/2022   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) 11/02/2022   Abnormal finding on MRI of brain 05/30/2022   Aneurysm of ascending aorta without rupture (HCC) 02/28/2022   Anemia due to acquired thiamine deficiency 12/01/2021   Seasonal allergic rhinitis due to pollen 11/08/2021   Family history of dissecting aortic aneurysm 11/08/2021   Contrast media allergy 11/08/2021   Avascular necrosis of bone of right hip (HCC) 11/02/2021   Avascular necrosis of bone of left hip (HCC) 11/02/2021   Primary hypertension 05/17/2021   Encounter for general adult medical examination with abnormal findings 05/17/2021   PTSD (post-traumatic stress disorder) 06/05/2018   Nightmares 09/27/2017   Brain tumor, glioma (HCC) 09/27/2017   Severe episode of recurrent major depressive disorder, without psychotic features (HCC)    Major depressive disorder, recurrent episode (HCC) 08/17/2016   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  Rationale for Evaluation and Treatment: Rehabilitation  PERTINENT HISTORY: 47 year old male with history of AVN of b-hips s/p R-THR, HTN,  PTSD, depression w/anxiety, ascending aortic aneurysm, 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 Adline Peals with 70% removal of tumor. Metronomic Temodar initiated. He completed standard radiation with concurrent temozolomide and Avastin in 2013. MRI on 11/09/2022 suspicious for progression and repeat MRI done 4 weeks later found  to have FLAIR lesion. He underwent repeat right temporal craniotomy for resection of tumor on 12/29/2022 by Dr. Lavonna Monarch at [facility]. This was complicated by bleeding from a branch of the MCA and post-op motor deficits of left side and left facial droop. Follow up MRI showed subjacent extra axial hemorrhage with layering blood products in right temporal lobe resection cavity with small volume SAH and trace cytotoxic edema in resection cavity.  "This (point to L) arm is a big challenge.  I will tell it to do something and it won't." Reports decreased sensation, limited FMC, limited ability to grade pressure.  Spouse reports that when attempting to complete bimanual tasks, LUE may start task with setup but will not complete task as RUE.  PRECAUTIONS: no lifting or strenuous exercise until cleared by MD ***  WEIGHT BEARING RESTRICTIONS: No   SUBJECTIVE:   SUBJECTIVE STATEMENT: Pt reports ***  not finishing Speech therapy homework, due to having kitchen appliance issues.  Pt reports [employer] contacted him and is sending him their device.  Pt accompanied by: self, mother  PAIN:  Are you having pain? *** Yes: NPRS scale: not rated/10 Pain description: throbbing Pain location: Lt hand feels cold and mild nerve pain  FALLS: Has patient fallen in last 6 months? No  LIVING ENVIRONMENT: Lives with: lives with their family (lives with spouse, 9 yo son and 61 yo daughter) Lives in: House/apartment Stairs: Yes: External: 3 steps; FIL can built hand rails Has following equipment at home: Single point cane, Environmental consultant - 2 wheeled, Marine scientist  PLOF: Independent, Independent with basic ADLs, and Vocation/Vocational requirements: [employer]  - unload trucks, break down pallets, Conservation officer, nature  PATIENT GOALS: to be able to play guitar again, not drop things   OBJECTIVE: (All objective assessments below are from initial evaluation on: 01/26/23 unless otherwise specified.)   HAND  DOMINANCE: Right  ADLs: Transfers/ambulation related to ADLs: Mod I without AD Eating: would need assist to cut meat, knocks items off table with L hand (use of 2 hands to stabilize and scoop) Grooming: utilizing LUE as gross assist to hold items but not utilizing during task UB Dressing: increased effort LB Dressing: increased effort, attempting to utilize LUE however it will frequently drop things. Difficulty with tying shoes/draw string and buttoning  Toileting: increased time/effort when tearing toilet paper Bathing: difficulty with use of LUE when washing R side of body Tub Shower transfers: Mod I stepping over tub ledge Equipment: Transfer tub bench  IADLs: Light housekeeping: attempted to feed dogs and dropped bowl from L hand Meal Prep: not currently cooking Community mobility: not cleared to drive Medication management: pt has a system and is able to remember to take as prescribed, difficulty with opening pill bottles  FUNCTIONAL OUTCOME MEASURES: Upper Extremity Functional Scale (UEFS): 18/80 or 22% functional (rated sole use of Lt arm)  UPPER EXTREMITY ROM:  RUE: WFL; LUE: Has active movement - can reach overhead and internal rotation with increased time/effort - difficulty sustaining, and increased compensations noted with fatigue   UPPER EXTREMITY MMT:   grossly 4+ to 5/5 bilaterally  HAND FUNCTION: 03/08/23: Lt hand ***#  02/01/23: Grip: Rt: 125#, Lt: 53#  COORDINATION: Box and Blocks:  Right 42 blocks, Left 12 blocks 02/14/23: L: 20 blocks  SENSATION: 03/08/23: SWMFT Lt hand: ***  02/01/23: OT tries Semmes-Weinstein Testing and he cannot perceive 6.65 (largest) most of the time globally in Lt hand, and so had diminished or no protective sensation and was cautioned about that.   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  continue on with functional activities and managing tightness and nerve issues.  Continue education on safety due to L inattention and decreased attention to tasks which impact driving.  Explore various compression garments.   03/01/23 UE ROM/motor control: engaged in PNF pattern with 1.5 kg medicine ball with focus on motor control and ROM.  Pt continues to demonstrate decreased L shoulder flexion and reaching with upward diagonal on L, therefore modified to reach towards shoulder.  Pt demonstrating good effort with task and receptive to education on functional carryover. Proprioception tasks: engaged in ball toss R<> L hand, ipsilateral L hand, and ball rolling on wall (attempted tapping).  Pt demonstrating difficulty with grading of pressure with tossing of ball especially when tossing/catching with L hand as well as attempts at tapping ball on wall.  Encouraged pt to utilize variety of sizes and weights of balls with above tasks and recommended use of balloon or beach ball due to decreased reliability with tosses requiring increased awareness of body in space.   02/27/23 Coordination: attempted picking up and placing small grooved pegs to further assess  difficulties with attempts at housekeeping projects.  Pt able to pick up and place 4 pegs with significantly increased time, largely due to impaired sensation.  OT providing cues to visually attend to LUE during structured tasks, especially to compensate for impaired sensation.  Transitioned to screw/unscrew nuts/bolts with focus on motor control and attention to UE during task. NMR: engaged in WB in quadruped with WB through elbows and forearms due to discomfort when WB through L hand.  Engaged in alternating reaching and WB when picking up pegs with alternating UE.  Pt able to complete large and medium pegs, attempting small pegs with significant increased challenge.  OT drawing attention to motor control with LUE vs RUE and cues to decrease shoulder and elbow compensatory movements when placing pegs.  OT applied wash cloth under pegs to provide contrast of textures when picking up pegs - pt with increased success with addition of wash cloth. Shoulder ROM: engaged in shoulder flexion and abduction with use of large therapy  ball to further facilitate ROM and stretching at end range.  Pt reports tightness in shoulder prior to ROM, tolerating ROM.    02/23/23 Kinesiotape: OT applied kinesiotape to L hand and forearm, anchored at lateral epicondyle and applied to forearm with 20-35% tension past DIP to facilitate edema management. Reviewed typical length of wear and purpose of kinesiotape.  Reviewed visual compensatory strategies: pt did report that he continues to bump into door walking in to laundry room and L arm will get caught.  OT reiterated visual scanning/head turns and/or brining LUE in front when walking through doorway. NMR: OT reviewed weightbearing exercises through "developmental postures" for NMR with crunch and extend with all extremities, superman for extension, and weightbearing and prone on elbows with weight shifts and scapular prone retraction. Pt with increased tolerance with pushing up  onto hands and knees into quadruped position.  Engaged in reaching with RUE to facilitate increased WB through LUE.  OT educated on child's pose to provide stretch and relaxation of LUE as pt with reports of tightness in L wrist with prolonged extension.  The entire time he was encouraged to breath through exercises. Reviewed prayer stretch, radial and median nerve glides, and sleeper stretch in sidelying.  OT providing demonstration and cues for sleeper stretch due to initial external rotation vs internal rotation per instructions.   PATIENT EDUCATION: Education details: ongoing condition specific education.  Educated on sensation and proprioceptive input. Person educated: Patient Education method: Explanation, Demonstration, and Handouts Education comprehension: verbalized understanding and needs further education  HOME EXERCISE PROGRAM: Developmental Motor Sequence (Rood) Do each 2-3 x day, don't hold breath, slowly, 5-10x each Crunches with arms and legs (Flexor-withdrawl) Roll over (toward unaffected side)  "Superman" extension exercises (Pivot prone)       4. Push up/down and Rock side/side on elbows       5. Push up to get to hands and knees   Access Code: [email or web address] URL: [email or web address] Date: 02/03/2023 Prepared by: [facility] - Outpatient  Rehab - [facility]  Access Code: [email or web address] URL: [email or web address] Date: 02/17/2023 Prepared by: [facility] - Outpatient  Rehab - [facility]   GOALS: Goals reviewed with patient? Yes  SHORT TERM GOALS: Target date: 02/24/23  Pt will be independent in HEP for LUE coordination and ROM. Baseline: Goal status: MET - 02/23/23  2.  Pt will verbalize understanding of task modifications and/or potential AE needs to increase ease, safety, and independence w/ ADLs. Baseline:  Goal status: MET - 02/23/23  3.  Patient will demonstrate improved awareness of visual management strategies for L  inattention. Baseline:  Goal status: IN PROGRESS  4.  Pt will demonstrate improved UE functional use for ADLs as evidenced by increasing box/ blocks score by 4 blocks with LUE Baseline: Box and Blocks:  Right 42 blocks, Left 12 blocks Goal status: MET - L: 20 blocks on 02/14/23   LONG TERM GOALS: Target date: 03/24/23  Pt will demonstrate improved UE functional use for ADLs as evidenced by increasing box/ blocks score by 8 blocks with LUE Baseline: Box and Blocks:  Right 42 blocks, Left 12 blocks Goal status: IN PROGRESS  2.  Pt will demonstrate improved fine motor coordination for ADLs as evidenced by being able to complete 9 hole peg test with LUE in 2 min time limit. Baseline:  Goal status: IN PROGRESS  3.  Pt will demonstrate improved awareness of safety considerations and AE needs to ensure safety d/t decreased sensation Baseline:  Goal status:  IN PROGRESS  4.  Pt will demonstrate improved attention to LUE to allow for sustained grasp to transport household items 25' to increase engagement in IADLs. Baseline:  Goal status: IN PROGRESS  5.  Pt will demonstrate improved functional use of LUE as evidenced by improved score by TBD on UEFS. Baseline: 18/80 or 22% functional (rated sole use of Lt arm) Goal status: IN PROGRESS   ASSESSMENT:  CLINICAL IMPRESSION: 03/08/23: ***  03/01/23: Pt demonstrating increased sustained attention this session with increased focus on tasks, intermittently looking out window or out door but able to return to task without cues.  Pt demonstrating good effort during tasks and good recall of use of vision to compensate for impaired sensation.  OT encouraged engagement in games with family/children to continue to focus on functional use of LUE, coordination, and attention as well as incorporating increased focus on proprioception.  OT reviewed WB  as strategy prior to ball toss/bounce tasks to increase awareness of LUE during mobility/tasks.  PLAN:  OT  FREQUENCY: 2x/week  OT DURATION: 8 weeks  PLANNED INTERVENTIONS: self care/ADL training, therapeutic exercise, therapeutic activity, neuromuscular re-education, manual therapy, passive range of motion, functional mobility training, splinting, electrical stimulation, ultrasound, compression bandaging, moist heat, cryotherapy, patient/family education, cognitive remediation/compensation, visual/perceptual remediation/compensation, psychosocial skills training, energy conservation, coping strategies training, and DME and/or AE instructions  CONSULTED AND AGREED WITH PLAN OF CARE: Patient and family member/caregiver  PLAN FOR NEXT SESSION:  ***     Fannie Knee, OTR/L 03/07/2023, 8:02 AM

## 2023-03-08 ENCOUNTER — Encounter: Payer: Self-pay | Admitting: Speech Pathology

## 2023-03-08 ENCOUNTER — Ambulatory Visit: Payer: BC Managed Care – PPO | Admitting: Rehabilitative and Restorative Service Providers"

## 2023-03-08 ENCOUNTER — Ambulatory Visit: Payer: BC Managed Care – PPO | Admitting: Speech Pathology

## 2023-03-08 ENCOUNTER — Encounter: Payer: Self-pay | Admitting: Rehabilitative and Restorative Service Providers"

## 2023-03-08 DIAGNOSIS — M6281 Muscle weakness (generalized): Secondary | ICD-10-CM

## 2023-03-08 DIAGNOSIS — R41841 Cognitive communication deficit: Secondary | ICD-10-CM | POA: Diagnosis not present

## 2023-03-08 DIAGNOSIS — R41840 Attention and concentration deficit: Secondary | ICD-10-CM

## 2023-03-08 DIAGNOSIS — I69354 Hemiplegia and hemiparesis following cerebral infarction affecting left non-dominant side: Secondary | ICD-10-CM | POA: Diagnosis not present

## 2023-03-08 DIAGNOSIS — R208 Other disturbances of skin sensation: Secondary | ICD-10-CM | POA: Diagnosis not present

## 2023-03-08 DIAGNOSIS — R4701 Aphasia: Secondary | ICD-10-CM | POA: Diagnosis not present

## 2023-03-08 DIAGNOSIS — R278 Other lack of coordination: Secondary | ICD-10-CM | POA: Diagnosis not present

## 2023-03-08 NOTE — Therapy (Signed)
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   Depression    Epistaxis    Headache    Hypertension    PTSD (post-traumatic stress disorder)    Seizures (HCC)    Past Surgical History:  Procedure Laterality Date   BASAL CELL CARCINOMA EXCISION  1995   BRAIN SURGERY  12/29/2022   CRANIOTOMY FOR TUMOR Right 02/25/2011   IR ANGIO EXTERNAL CAROTID SEL EXT CAROTID BILAT MOD SED  11/16/2021   IR ANGIO INTRA EXTRACRAN SEL COM CAROTID INNOMINATE UNI R MOD SED  11/16/2021   IR ANGIO INTRA EXTRACRAN SEL INTERNAL CAROTID UNI L MOD SED  11/16/2021   IR ANGIO VERTEBRAL SEL VERTEBRAL UNI R MOD SED  11/16/2021   IR ANGIOGRAM FOLLOW UP STUDY  11/17/2021   IR NEURO EACH ADD'L AFTER BASIC UNI LEFT (MS)  11/17/2021   IR NEURO EACH ADD'L AFTER BASIC UNI RIGHT (MS)  11/17/2021   IR TRANSCATH/EMBOLIZ  11/16/2021   IR US GUIDE VASC ACCESS RIGHT  11/16/2021   RADIOLOGY WITH ANESTHESIA N/A 11/16/2021   Procedure: IR WITH ANESTHESIA;  Surgeon: Baldemar Lenis, MD;  Location: [facility] OR;  Service: Radiology;  Laterality: N/A;   TOTAL HIP ARTHROPLASTY Right 12/21/2021   Procedure: RIGHT TOTAL HIP REPLACEMENT;  Surgeon: Tarry Kos, MD;  Location: [facility] OR;  Service: Orthopedics;  Laterality: Right;  3-C   WISDOM TOOTH EXTRACTION     Patient Active Problem  List   Diagnosis Date Noted   Left hemiparesis (HCC) 02/19/2023   Hemisensory deficit 02/19/2023   Shoulder-hand syndrome 02/19/2023   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 02/08/2023   Depression with anxiety 01/11/2023   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   Coagulation defect, unspecified (HCC) 11/02/2022   Amnestic MCI (mild cognitive impairment with memory loss) 11/02/2022   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) 11/02/2022   Abnormal finding on MRI of brain 05/30/2022   Aneurysm of ascending aorta without rupture (HCC) 02/28/2022   Anemia due to acquired thiamine deficiency 12/01/2021   Seasonal allergic rhinitis due to pollen 11/08/2021   Family history of dissecting aortic aneurysm 11/08/2021   Contrast media allergy 11/08/2021   Avascular necrosis of bone of right hip (HCC) 11/02/2021   Avascular necrosis of bone of left hip (HCC) 11/02/2021   Primary hypertension 05/17/2021   Encounter for general adult medical examination with abnormal findings 05/17/2021   PTSD (post-traumatic stress disorder) 06/05/2018   Nightmares 09/27/2017   Brain tumor, glioma (HCC) 09/27/2017   Severe episode of recurrent major depressive disorder, without psychotic features (HCC)    Major depressive disorder, recurrent episode (HCC) 08/17/2016   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 Begins Temodar this week. Pt accompanied by: self  PERTINENT HISTORY:  Patient is a 47 year old  male with history of AVN of b-hips s/p R-THR, HTN, PTSD, depression w/anxiety, ascending aortic aneurysm, 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 Adline Peals with 70% removal of tumor. Metronomic Temodar initiated. He completed standard radiation with concurrent temozolomide and Avastin in 2013. MRI on 11/09/2022 suspicious for progression and repeat MRI done 4 weeks later found to have FLAIR lesion. He underwent repeat right temporal craniotomy for resection of tumor on 12/29/2022 by Dr. Lavonna Monarch at [facility]. This was complicated by bleeding from a branch of the MCA and post-op motor deficits of left side and left facial droop. Follow up MRI showed subjacent extra axial hemorrhage with layering blood products in right temporal lobe resection cavity with small volume SAH and trace cytotoxic edema in resection cavity. Therapy evaluations completed with recommendations for CIR. Patient admitted 01/06/23. SLP consult today due to reports of cognitive changes and biting is tongue when eating.   Pt's wife sent a message to pt's therapists via MyChart on 01/25/23 which stated: Hello Dr. Sundra Aland, this is Shawn York's wife Shawn York and I wanted to write to you prior to our upcoming visit tomorrow to inform you of the current situation. Shawn York is having a hard time following through with simple tasks and it's causing a lot of stress in the home. He does not want to hear how important it is for him to do his PT from me, but he is not doing the exercises like he should. His depression is very severe and is aiding in the current situation at home. I wanted you to know ahead of time so I don't have to bring this up in front of him at our appointment.   PAIN:  Are you having pain? No  FALLS: Has patient fallen in last 6 months?  No  PATIENT GOALS: Return to work  OBJECTIVE:   DIAGNOSTIC FINDINGS:  MR BRAIN WO CONTRAST December 12 2022 IMPRESSION: 1. Changes since 2020 MRI in the residual superior Right temporal gyrus highly suspicious for Tumor Recurrence, including new mass-like T2/FLAIR hyperintense gyral expansion and small new multifocal nodular enhancement there. No intracranial mass effect 2. Also new since 2020 but chronic appearing lacunar infarcts in the right basal ganglia. 3. No other acute intracranial abnormality.   MR BRAIN WO CONTRAST  Jan 17 2023 IMPRESSION: 1. 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 Previously seen nodular foci of contrast enhancement in the right temporal lobe are no longer visualized. 2. New T2/FLAIR hyperintense signal abnormality in the right corona radiata with associated curvilinear contrast enhancement in these regions. This could potentially represent a region of subacute infarct. Recommend attention on follow-up. 3. Small volume subarachnoid hemorrhage and postoperative subdural hematoma along the right cerebral convexity, as above. 4. 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 5. Dural enhancement  and thickening along the left occipital convexity and likely the left temporal convexity, which may be due to CSF hypotension.  Discharge from SLP service:Jan 17, 2023 Patient has met 1 of 1 long term goals.  Patient to discharge at overall Modified Independent level.  Reasons goals not met: N/A  Clinical Impression/Discharge Summary: Patient has made functional gains and has met 1 of 1 LTGs this admission. Currently, patient requires extra processing time and is overall Mod I for recall of daily information with use of compensatory strategies. Patient continues to report impaired sensation of his oral-motor musculature resulting in him biting  his lips and tongue intermittently. However, his strength appears intact without evidence of dysarthria and with 100% intelligibility. Patient and family education is complete and patient will discharge home with assistance from family. Due to patient's high level of functioning and overall independence at baseline, f/u outpatient SLP services are recommended.  Care Partner:  Caregiver Able to Provide Assistance: Yes  Type of Caregiver Assistance: Cognitive;Physical Recommendation:  Outpatient SLP;24 hour supervision/assistance  Rationale for SLP Follow Up: Maximize cognitive function and independence;Reduce caregiver burden   PATIENT REPORTED OUTCOME MEASURES (PROM): Cognitive Function: returned 02/27/23 and pt scored 91/140, with higher scores indicating lesser impact of cognition on QOL.   TODAY'S TREATMENT:                                                                                                                                         DATE:   03/08/23: Pt reports he completed HEP - errors made. SLP demonstrated how to check work more efficiently with highlighting and checking systematically.   Reviewed making list and getting items out BEFORE starting task to increase efficiency and reduce errors. Pt to complete following example for HEP.   Example:  -Paving additional parking lot space in driveway  Pt reports anxiety/depression. SLP and pt discussed relationships at home. Pt reports sensory overload, as well as, re: decreased memory of conversations and decreased motivation to complete task lists impacting his relationship. He feels like he is letting his wife down. SLP suggested Shawn York come in for communication strategies for help managing this change in dynamic.   Pt reports he will talk to [facility]. Also encouraged pt to reach out to doc regarding medication re: ritalin, if he feels like it hasn't made much of an impact.    03/06/23: Pt's last page of homework not completed. Tells  SLP he double checked his answers however the last two responses on the previous page are not completed either.  Pt tells SLP he forgot a step in replacement of bathroom vanity now requiring him to buy a wrench to remove valves on water lines. SLP worked with pt for a functional compensation and pt agreed, after SLP explanation, that writing out a checklist for remaining steps would be helpful and pt did so with good success. 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  Pt bought pill box after last session and filled on his own. He did not have anyone double check his accuracy although he states he is chdcking them prior to taking them. SLP told pt to have wife double check his accuracy each time he fills for the next 3-4 weeks, at least.   03/01/23: Pt brought 3-ring binder. Homework 3/5 completed. Pt told SLP he double checked homework but one response in the middle of the homework set was half completed. "I guess I didn't double check that one," Reice stated. He has not obtained a pill box yet so SLP assisted pt in reasoning how he could ensure he would get one and fill it by next session. Pt put a reminder into his phone with min cue and extra time. SLP needed to provide cues for timing of alarm - pt had it set for a time when he may have already been home instead of shortly after rehab visits concluded. SLP asked therapeutic questions relating to anticipatory awareness with a home project and pt stated, "Wow, I'm starting to see how debilitating this is."    02/27/23:Pt acknowledged that the 3-ring binder is helping in keeping him better organized than the folder. Papers were hole-punched and put in the binder. SLP inquired about pt's session Thursday and Shawn York told SLP some points using the visual reminder that SLP had written for him. SLP reviewed mental energy/energy conservation with pt. Pt has not obtained a  pillbox, states he will do that this week. Pt stated [facility] also initiated a low dose of Ritalin. Please see [facility] note dated 02/22/23 in Care Everywhere for details on dosages. Today SLP assisted pt complete a simple but detailed functional task with simple executive function (time management of errands). Pt req'd min-mod cues for sequencing logically (left groceries in car for other errands), and for time to complete tasks. Homework for simple sequencing of written steps.  02/23/23: Pt was seen for skilled ST services targeting cognitive-communication. Provided edu on attention strategies re: managing fatigue to decrease cognitive load, managing distractions, brain breaks. SLP suggested pt get a pillbox to complete on Sundays for energy conservation as pt reports he takes his medicine every day. SLP and pt decided this would reduce pill bottle opening and increase efficiency. Pt is not consistently using alarms as discussed last session - pt reports he will continue to attempt to use this strategy to assist with time management.    02/20/23: Pt indicating challenges with time management. SLP A pt in generating solutions for improved time management and task initiation using his phone as an external aid. Reminders and alarm apps demonstrated with features such as reoccurrence and location. Given example task, pt able to verbalize how he would use external cognitive aid to optimize in 4/4 trials. Led pt through convergent naming task, 75% accuracy, benefiting from cues to consider all details to form response. SLP facilitated generative language task with pt demonstrating need for visual reminders to aid in ability to complete accurately. With visual support and verbal cues to ongoing maintenance towards goal of completing 5, pt with 80% accuracy.   02/17/23: Very flat affect today. See "S" statement re: homework, phone, wallet. SLP asked for PROM which pt found in his therapy folder, not started. SLP told pt to  complete for next session and asked him when that was - and pt discovered at this moment  he forgot phone at home. Given "S" and pt leaving his phone and wallet at home, SLP took the initial part of the session to assist pt in thinking of a better system to improve organization and planning. Pt unable to generate any feasible ideas so SLP suggested memory notebook/binder to organize his therapy information and achieve better organization in general (see pt instructions). SLP targeted organization and he separated all handouts with extra time and rare min A. He stuffed these back into his folder again and SLP req'd to provide cues for a more effective organizational strategy - for the time being, clip them together. Pt to hole punch handouts and put in 3-ring binder this weekend.  No dysarthria observed in today's session. Consider providing lingual strength exercises as pt has complained of slurred speech and his tongue feeling "fat" in one previous session.  02/14/23: Pt brought homework folder today, however homework was unfinished as described in "s". Pt was unaware homework was unfinished.  He was 10 minutes late for initial session (OT), reported to SLP "My mom was pushing me out of the house, I was taking a birdbath." When asked about this pt demonstrated reduced anticipatory awareness and stated he "always" had difficulty with time management at work as well - started projects too late or would take more time that he was allotted to complete.  Today SLP cued pt to look at task prior to completing it to familarize himself with the task proir to jumping in. In these detailed functional tasks pt completed with 100% accuracy x2, but third task with 60% success (3/5 correct). He did not double check answers, which was the initial "takeaway" SLP reiterated to pt about not completing his homework. When asked how he could improve his accuracy he told SLP he should have double checked his answers.  02/10/23:  Completed HEP but forgot at home. Denied difficulty as primary SLP assisted with initial game plan for execution. Reduced attention noted in conversation with pt fixating on computer screen. Attention mildly improved following SLP commenting on reduced attention and minimizing visual distractions. Targeted attention to detail task with following specific instruction and decoding message. Pt completed tasks x2 with pt independently noting errors mid-task which skewed result. Pt able to correct with additional time and mod A. Targeted metacognitive skills, in which pt unable to ID solution to aid performance. SLP prompted marking letters to aid attention and processing, in which pt stated "but that is so slow". Re-educated importance of slow rate to aid processing speed and attention to increase performance. Similar result occurred for second task. Pt verbally identified mistake but did not correct errors without prompt. Mild background noise (typing and voices in gym) was not impactful on performance. Pt reported tongue feels "fat" and reduced left facial sensation, which impacts intelligibility intermittently. Targeted velar sounds at word level for varied placements and increased number of syllables with rare errors noted. Benefited from slower rate for increased articulatory accuracy.   02/03/23: Pt did not return cognitive function PROM. SLP provided pt with targeted attention and organization tasks. Pt worked with detailed instrucutions and mathematical problems and did not double check his answers. Two errors were made which were not noticed by pt. SLP stressed to pt he will need to double check his answers now more than ever before. Pt said he would do so on remaining homework problems. SLP also told pt to download MyChart app to track appointments.   02/01/23: SLP completed the CLQT and discussed results  with pt. Organization, planning, memory, and attention appear to be pt's deficit areas for SLP to target.  Results are above.  01/26/23: SLP discussed some compensations for mealtime as pt stated he is having difficulty feeling food on the lt labial margin during meals - setting a mirror up in front of him at meals was suggested.  Standardized eval results, as much as pt has completed, were also reviewed.  PATIENT EDUCATION: Education details: see "today's treatment" Person educated: Patient Education method: Explanation, Demonstration, and Verbal cues Education comprehension: verbalized understanding and needs further education   GOALS: Goals reviewed with patient? No  SHORT TERM GOALS: Target date: 03/03/23  Pt will demo alternating attention with min-mod complex tasks involving language with compensations 90%, in 3 sessions Baseline:02/27/23 Goal status: NOT MET  2.  Pt/family will report pt completes household tasks involving language for 30 consecutive minutes with compensations if necessary, in 3sessions Baseline:  Goal status: NOT MET  3.  Pt will demo use of cognitive (memory, planning/organization, attention, awareness) compensations when performing detailed language tasks in order to achieve 90% success, in 3 sessions Baseline:  Goal status: NOT MET  4.  Pt will demo compensations with POs for reported labial leakage due to facial numbness on lt (including upper/lower lips)  Baseline:  Goal Status: Deferred - Resolved   LONG TERM GOALS: Target date: 03/31/23  Pt will demo alternating attention with min-mod complex work-like tasks involving language (using interruptions) with compensations 100%, in 3 sessions Baseline:  Goal status: REVISED  2.  Pt/family will report pt completes household tasks involving language for 30 minutes with compensations if necessary, in 3 sessions Baseline:  Goal status: REVISED  3.  Pt will demo knowledge of need to employ compensations when performing detailed language tasks in order to achieve 100% success, in 3 sessions Baseline:  Goal  status: REVISED  4.  Pt will tell SLP 3 compensations he may need to use to improve accuracy with recall and/or attention Baseline:  Goal status: REVISED  5.  Pt will score higher on cognitive PROM in the last 1-2 sessions than on initial administration  Baseline:  Goal Status: IN PROGRESS  6. Pt will perform HEP for motor speech with modified independence in 3 sessions  Baseline:  Goal status: INITIAL   ASSESSMENT:  CLINICAL IMPRESSION: Dayveon showed signs of improving awareness today in session, however reports he makes mistakes with accuracy at home (See "today's treatment" for more details of today's session). Patient is a 47 y.o. male who was seen today therapy targeting planning, organization, and attention. Pt's motor-speech will also cont to be targeted intermittently in ST - SLP added LTG for HEP for motor speech. Pt's cognition as it stands currently would be detrimental to his job security and pt would benefit from skilled ST to improve these skills.   OBJECTIVE IMPAIRMENTS: include attention, memory, awareness, and executive functioning. These impairments are limiting patient from return to work, managing medications, managing appointments, managing finances, household responsibilities, ADLs/IADLs, and effectively communicating at home and in community. Factors affecting potential to achieve goals and functional outcome are cooperation/participation level (according to wife, Shawn York). Patient will benefit from skilled SLP services to address above impairments and improve overall function.  REHAB POTENTIAL: Good  PLAN:  SLP FREQUENCY: 2x/week  SLP DURATION: 8 weeks (or 17 sessions)  PLANNED INTERVENTIONS: Language facilitation, Environmental controls, Cueing hierachy, Cognitive reorganization, Internal/external aids, Functional tasks, SLP instruction and feedback, Compensatory strategies, and Patient/family education    [place]  L Nachum Derossett, CCC-SLP 03/08/2023, 11:09 AM

## 2023-03-13 ENCOUNTER — Ambulatory Visit: Payer: BC Managed Care – PPO | Admitting: Occupational Therapy

## 2023-03-13 ENCOUNTER — Ambulatory Visit: Payer: BC Managed Care – PPO

## 2023-03-13 DIAGNOSIS — I69354 Hemiplegia and hemiparesis following cerebral infarction affecting left non-dominant side: Secondary | ICD-10-CM

## 2023-03-13 DIAGNOSIS — R208 Other disturbances of skin sensation: Secondary | ICD-10-CM

## 2023-03-13 DIAGNOSIS — R41841 Cognitive communication deficit: Secondary | ICD-10-CM | POA: Diagnosis not present

## 2023-03-13 DIAGNOSIS — M6281 Muscle weakness (generalized): Secondary | ICD-10-CM

## 2023-03-13 DIAGNOSIS — R4701 Aphasia: Secondary | ICD-10-CM

## 2023-03-13 DIAGNOSIS — R278 Other lack of coordination: Secondary | ICD-10-CM | POA: Diagnosis not present

## 2023-03-13 DIAGNOSIS — R41840 Attention and concentration deficit: Secondary | ICD-10-CM | POA: Diagnosis not present

## 2023-03-13 NOTE — Therapy (Signed)
OUTPATIENT OCCUPATIONAL THERAPY NEURO TREATMENT NOTE  Patient Name: Shawn York MRN: [id number] DOB:1975-09-21, 47 y.o., male Today's Date: 03/13/2023  PCP: Etta Grandchild, MD REFERRING PROVIDER: Milinda Antis, PA-C  END OF SESSION:  OT End of Session - 03/13/23 1304     Visit Number 12    Number of Visits 17    Date for OT Re-Evaluation 03/24/23    Authorization Type [employer]    OT Start Time 1150    OT Stop Time 1232    OT Time Calculation (min) 42 min    Equipment Utilized During Treatment red foam tubing    Activity Tolerance No increased pain;Patient tolerated treatment well;Patient limited by fatigue;Patient limited by pain    Behavior During Therapy [facility] for tasks assessed/performed              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   Depression    Epistaxis    Headache    Hypertension    PTSD (post-traumatic stress disorder)    Seizures (HCC)    Past Surgical History:  Procedure Laterality Date   BASAL CELL CARCINOMA EXCISION  1995   BRAIN SURGERY  12/29/2022   CRANIOTOMY FOR TUMOR Right 02/25/2011   IR ANGIO EXTERNAL CAROTID SEL EXT CAROTID BILAT MOD SED  11/16/2021   IR ANGIO INTRA EXTRACRAN SEL COM CAROTID INNOMINATE UNI R MOD SED  11/16/2021   IR ANGIO INTRA EXTRACRAN SEL INTERNAL CAROTID UNI L MOD SED  11/16/2021   IR ANGIO VERTEBRAL SEL VERTEBRAL UNI R MOD SED  11/16/2021   IR ANGIOGRAM FOLLOW UP STUDY  11/17/2021   IR NEURO EACH ADD'L AFTER BASIC UNI LEFT (MS)  11/17/2021   IR NEURO EACH ADD'L AFTER BASIC UNI RIGHT (MS)  11/17/2021   IR TRANSCATH/EMBOLIZ  11/16/2021   IR US GUIDE VASC ACCESS RIGHT  11/16/2021   RADIOLOGY WITH ANESTHESIA N/A 11/16/2021   Procedure: IR WITH ANESTHESIA;  Surgeon: Baldemar Lenis, MD;  Location[facility] OR;  Service: Radiology;  Laterality: N/A;   TOTAL HIP ARTHROPLASTY Right 12/21/2021    Procedure: RIGHT TOTAL HIP REPLACEMENT;  Surgeon: Tarry Kos, MD;  Location: [facility] OR;  Service: Orthopedics;  Laterality: Right;  3-C   WISDOM TOOTH EXTRACTION     Patient Active Problem List   Diagnosis Date Noted   Left hemiparesis (HCC) 02/19/2023   Hemisensory deficit 02/19/2023   Shoulder-hand syndrome 02/19/2023   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 02/08/2023   Depression with anxiety 01/11/2023   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   Coagulation defect, unspecified (HCC) 11/02/2022   Amnestic MCI (mild cognitive impairment with memory loss) 11/02/2022   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) 11/02/2022   Abnormal finding on MRI of brain 05/30/2022   Aneurysm of ascending aorta without rupture (HCC) 02/28/2022   Anemia due to acquired thiamine deficiency 12/01/2021   Seasonal allergic rhinitis due to pollen 11/08/2021   Family history of dissecting aortic aneurysm 11/08/2021   Contrast media allergy 11/08/2021   Avascular necrosis of bone of right hip (HCC) 11/02/2021   Avascular necrosis of bone of left hip (HCC) 11/02/2021   Primary hypertension 05/17/2021   Encounter for general adult medical examination with abnormal findings 05/17/2021   PTSD (post-traumatic stress disorder) 06/05/2018   Nightmares 09/27/2017   Brain tumor, glioma (HCC)  09/27/2017   Severe episode of recurrent major depressive disorder, without psychotic features (HCC)    Major depressive disorder, recurrent episode (HCC) 08/17/2016   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    ONSET DATE: 12/29/22 DOS craniotomy and tumor resection   REFERRING DIAG: C71.2 (ICD-10-CM) - Anaplastic oligodendroglioma of temporal lobe (HCC) I63.112 (ICD-10-CM) - 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  THERAPY DIAG:  Hemiplegia and hemiparesis following cerebral infarction affecting left  non-dominant side (HCC)  Other lack of coordination  Muscle weakness (generalized)  Other disturbances of skin sensation  Rationale for Evaluation and Treatment: Rehabilitation  PERTINENT HISTORY: 47 year old male with history of AVN of b-hips s/p R-THR, HTN, PTSD, depression w/anxiety, ascending aortic aneurysm, 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 Adline Peals with 70% removal of tumor. Metronomic Temodar initiated. He completed standard radiation with concurrent temozolomide and Avastin in 2013. MRI on 11/09/2022 suspicious for progression and repeat MRI done 4 weeks later found to have FLAIR lesion. He underwent repeat right temporal craniotomy for resection of tumor on 12/29/2022 by Dr. Lavonna Monarch at [facility] This was complicated by bleeding from a branch of the MCA and post-op motor deficits of left side and left facial droop. Follow up MRI showed subjacent extra axial hemorrhage with layering blood products in right temporal lobe resection cavity with small volume SAH and trace cytotoxic edema in resection cavity.  "This (point to L) arm is a big challenge.  I will tell it to do something and it won't." Reports decreased sensation, limited FMC, limited ability to grade pressure.  Spouse reports that when attempting to complete bimanual tasks, LUE may start task with setup but will not complete task as RUE.  PRECAUTIONS: no lifting or strenuous exercise until cleared by MD - no valsalva   WEIGHT BEARING RESTRICTIONS: No   SUBJECTIVE:   SUBJECTIVE STATEMENT: Pt reports that he had a "skipped dose" of his oral chemo due to spouse being out of town and pill box ending on Sunday.  Pt also reports working on flooring in his house.   PAIN:  Are you having pain? None at rest today    FALLS: Has patient fallen in last 6 months? 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, break down pallets, Conservation officer, nature  PATIENT GOALS: to be able to play guitar again, not drop things   OBJECTIVE: (All objective assessments below are from initial evaluation on: 01/26/23 unless otherwise specified.)   HAND DOMINANCE: Right  ADLs: Transfers/ambulation related to ADLs: Mod I without AD Eating: would need assist to cut meat, knocks items off table with L hand (use of 2 hands to stabilize and scoop) Grooming: utilizing LUE as gross assist to hold items but not utilizing during task UB Dressing: increased effort LB Dressing: increased effort, attempting to utilize LUE however it will frequently drop things. Difficulty with tying shoes/draw string and buttoning  Toileting: increased time/effort when tearing toilet paper Bathing: difficulty with use of LUE when washing R side of body Tub Shower transfers: Mod I stepping over tub ledge Equipment: Transfer tub bench  IADLs: Light housekeeping: attempted to feed dogs and dropped bowl from L hand Meal Prep: not currently cooking Community mobility: not cleared to drive Medication management: pt has a system and is able to remember to take as prescribed, difficulty with opening pill bottles  FUNCTIONAL OUTCOME MEASURES: Upper Extremity Functional Scale (UEFS): 18/80 or 22% functional (rated sole use of Lt arm)  UPPER EXTREMITY ROM:  RUE: WFL; LUE: Has active movement - can reach overhead and internal rotation with increased time/effort - difficulty sustaining, and increased compensations noted with fatigue   UPPER EXTREMITY MMT:   grossly 4+ to 5/5 bilaterally  HAND FUNCTION: 03/08/23: Lt hand 44#   02/01/23: Grip: Rt:  125#, Lt: 53#  COORDINATION: Box and Blocks:  Right 42 blocks, Left 12 blocks 02/14/23: L: 20 blocks 03/13/23: L: 26 blocks  SENSATION: 03/08/23: SWMFT Lt hand: Was able to perceive the 6.65 in median and ulnar nerves,, but was only 70-18% accurate for which finger was being touched within the nerve distribution. This shows progress!    02/01/23: OT tries Semmes-Weinstein Testing and he cannot perceive 6.65 (largest) most of the time globally in Lt hand, and so had diminished or no protective sensation and was cautioned about that.   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 Pt picked up 1 inch blocks with gripper with 6/15 drops and min difficulty with sustained grasp to place into container. Completed 2nd time with same resistance with cues to increase visual attention to hand and task, pt dropping 3/15 during second set.  Increased to level 50# with yellow spring.  Pt dropping 4/15 with cues to visually attend to LUE to facilitate increased attention to task and sustained grasp due to visual inattention to L.  Engaged in discussion to take extra time to ensure proper  setup to increase stablity with holding items.   Box and blocks: L: 26 blocks Coordination: engaged in stacking and unstacking blocks with LUE to address coordination, ability to grade pressure with grasp/release.  Pt demonstrating improved motor control and grading pressure with stacking and unstacking only dropping x1. Tool use: engaged in picking up balls with tongs to place onto vertical cones with focus on grading pressure with grasp/release and motor control with placing balls onto cones.  Pt demonstrating decreased sustained attention to LUE especially when removing balls from cones, frequently releasing grasp on ball during transitional movement. OT encouraged pt to pick up, place, and carry styrofoam cup with slits cut into it to further facilitate attention to grasp pressure when picking up and/or carrying items.  03/08/23: OT starts by educating on fine motor skills for in hand manipulations as listed below.  He struggles with this and was given a large red foam tubing to make it a bit easier.  He also performs bimanual functional activities including folding paper into paper airplane, was encouraged to do laundry folding at home, does a bimanual cutting activity and was encouraged to do art projects with his kids at home.  He performs grip testing which sadly is worse today, but fortunately during Semmes Weinstein monofilament testing he was able to perceive the largest monofilament, which before he was not able to.  Lastly today for pinch strengthening and forced left arm use, he uses blue weighted) to help reach grab and pinch markers and move them to a container.  He does repetitive blocked practice with this, dropping some times and having difficulty managing his pinch and gross coordination.    03/01/23 UE ROM/motor control: engaged in PNF pattern with 1.5 kg medicine ball with focus on motor control and ROM.  Pt continues to demonstrate decreased L shoulder flexion and reaching with  upward diagonal on L, therefore modified to reach towards shoulder.  Pt demonstrating good effort with task and receptive to education on functional carryover. Proprioception tasks: engaged in ball toss R<> L hand, ipsilateral L hand, and ball rolling on wall (attempted tapping).  Pt demonstrating difficulty with grading of pressure with tossing of ball especially when tossing/catching with L hand as well as attempts at tapping ball on wall.  Encouraged pt to utilize variety of sizes and weights of balls with above tasks and recommended use of balloon or beach ball due to decreased reliability with tosses requiring increased awareness of body in space.     PATIENT EDUCATION: Education details: ongoing condition specific education.  Educated on sensation and proprioceptive input. Person educated: Patient Education method: Explanation, Demonstration, and Handouts Education comprehension: verbalized understanding and needs further education  HOME EXERCISE PROGRAM: Developmental Motor Sequence (Rood) Do each 2-3 x day, don't hold breath, slowly, 5-10x each Crunches with arms and legs (Flexor-withdrawl) Roll over (toward unaffected side)  "Superman" extension exercises (Pivot prone)       4. Push up/down and Rock side/side on elbows       5. Push up to get to hands and knees   Access Code: [email or web address] URL: [email or web address] Date: 02/03/2023 Prepared by: [facility] - Outpatient  Rehab - [facility]  Access Code: [email or web address] URL: [email or web address] Date: 02/17/2023 Prepared by: [facility] - Outpatient  Rehab - [facility]   GOALS: Goals reviewed with patient? Yes  SHORT TERM GOALS: Target date: 02/24/23  Pt will be independent in HEP for LUE coordination and ROM. Baseline: Goal status: MET - 02/23/23  2.  Pt will verbalize understanding of task modifications and/or potential AE needs to increase ease, safety, and independence w/ ADLs. Baseline:   Goal status: MET - 02/23/23  3.  Patient will demonstrate improved awareness of visual management strategies for L inattention. Baseline:  Goal status: IN PROGRESS  4.  Pt will demonstrate improved UE functional use for ADLs as evidenced by increasing box/ blocks score by 4 blocks with LUE Baseline: Box and Blocks:  Right 42 blocks, Left 12 blocks Goal status: MET - L: 20 blocks on 02/14/23   LONG TERM GOALS: Target date: 03/24/23  Pt will demonstrate improved UE functional use for ADLs as evidenced by increasing box/ blocks score by 8 blocks with LUE Baseline: Box and Blocks:  Right 42  blocks, Left 12 blocks Goal status: MET - L: 26 blocks on 03/13/23  2.  Pt will demonstrate improved fine motor coordination for ADLs as evidenced by being able to complete 9 hole peg test with LUE in 2 min time limit. Baseline:  Goal status: IN PROGRESS  3.  Pt will demonstrate improved awareness of safety considerations and AE needs to ensure safety d/t decreased sensation Baseline:  Goal status: IN PROGRESS  4.  Pt will demonstrate improved attention to LUE to allow for sustained grasp to transport household items 25' to increase engagement in IADLs. Baseline:  Goal status: IN PROGRESS  5.  Pt will demonstrate improved functional use of LUE as evidenced by improved score by TBD on UEFS. Baseline: 18/80 or 22% functional (rated sole use of Lt arm) Goal status: IN PROGRESS   ASSESSMENT:  CLINICAL IMPRESSION: Reiterated recommendations to engage in more arts and crafts type activities with children and simple household tools (spatula, tongs, scissors) as pt attempting to engage in challenging house renovation tasks.  Pt continues to demonstrate decreased awareness and sustained grasp in LUE, impacting safety with house renovation tasks as well as routine tasks. Pt frustrated with lack of progress and wanting to engage in higher level remodeling tasks.  PLAN:  OT FREQUENCY: 2x/week  OT  DURATION: 8 weeks  PLANNED INTERVENTIONS: self care/ADL training, therapeutic exercise, therapeutic activity, neuromuscular re-education, manual therapy, passive range of motion, functional mobility training, splinting, electrical stimulation, ultrasound, compression bandaging, moist heat, cryotherapy, patient/family education, cognitive remediation/compensation, visual/perceptual remediation/compensation, psychosocial skills training, energy conservation, coping strategies training, and DME and/or AE instructions  CONSULTED AND AGREED WITH PLAN OF CARE: Patient and family member/caregiver  PLAN FOR NEXT SESSION:  Continue to challenge him in terms of left arm use decreasing inattention to left arm fine motor skills and gross motor coordination, periodically doing stretches and strengthening as tolerated and appropriate as well.     Rosalio Loud, OTR/L 03/13/2023, 1:04 PM

## 2023-03-13 NOTE — Therapy (Signed)
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   Depression    Epistaxis    Headache    Hypertension    PTSD (post-traumatic stress disorder)    Seizures (HCC)    Past Surgical History:  Procedure Laterality Date   BASAL CELL CARCINOMA EXCISION  1995   BRAIN SURGERY  12/29/2022   CRANIOTOMY FOR TUMOR Right 02/25/2011   IR ANGIO EXTERNAL CAROTID SEL EXT CAROTID BILAT MOD SED  11/16/2021   IR ANGIO INTRA EXTRACRAN SEL COM CAROTID INNOMINATE UNI R MOD SED  11/16/2021   IR ANGIO INTRA EXTRACRAN SEL INTERNAL CAROTID UNI L MOD SED  11/16/2021   IR ANGIO VERTEBRAL SEL VERTEBRAL UNI R MOD SED  11/16/2021   IR ANGIOGRAM FOLLOW UP STUDY  11/17/2021   IR NEURO EACH ADD'L AFTER BASIC UNI LEFT (MS)  11/17/2021   IR NEURO EACH ADD'L AFTER BASIC UNI RIGHT (MS)  11/17/2021   IR TRANSCATH/EMBOLIZ  11/16/2021   IR US GUIDE VASC ACCESS RIGHT  11/16/2021   RADIOLOGY WITH ANESTHESIA N/A 11/16/2021   Procedure: IR WITH ANESTHESIA;  Surgeon: Baldemar Lenis, MD;  Location: [facility] OR;  Service: Radiology;  Laterality: N/A;   TOTAL HIP ARTHROPLASTY Right 12/21/2021   Procedure: RIGHT TOTAL HIP REPLACEMENT;  Surgeon: Tarry Kos, MD;  Location: [facility] OR;  Service: Orthopedics;  Laterality: Right;  3-C   WISDOM TOOTH EXTRACTION     Patient Active Problem  List   Diagnosis Date Noted   Left hemiparesis (HCC) 02/19/2023   Hemisensory deficit 02/19/2023   Shoulder-hand syndrome 02/19/2023   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 02/08/2023   Depression with anxiety 01/11/2023   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   Coagulation defect, unspecified (HCC) 11/02/2022   Amnestic MCI (mild cognitive impairment with memory loss) 11/02/2022   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) 11/02/2022   Abnormal finding on MRI of brain 05/30/2022   Aneurysm of ascending aorta without rupture (HCC) 02/28/2022   Anemia due to acquired thiamine deficiency 12/01/2021   Seasonal allergic rhinitis due to pollen 11/08/2021   Family history of dissecting aortic aneurysm 11/08/2021   Contrast media allergy 11/08/2021   Avascular necrosis of bone of right hip (HCC) 11/02/2021   Avascular necrosis of bone of left hip (HCC) 11/02/2021   Primary hypertension 05/17/2021   Encounter for general adult medical examination with abnormal findings 05/17/2021   PTSD (post-traumatic stress disorder) 06/05/2018   Nightmares 09/27/2017   Brain tumor, glioma (HCC) 09/27/2017   Severe episode of recurrent major depressive disorder, without psychotic features (HCC)    Major depressive disorder, recurrent episode (HCC) 08/17/2016   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    ONSET DATE: 12/29/22  REFERRING DIAG: C71.2 (ICD-10-CM) - Anaplastic oligodendroglioma of temporal lobe (HCC) I63.112 (ICD-10-CM) - 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 Adline Peals with 70% removal of tumor. Metronomic Temodar initiated. He completed standard radiation with concurrent temozolomide and Avastin in 2013. MRI on 11/09/2022 suspicious for progression and repeat MRI done 4 weeks later found to have FLAIR lesion. He underwent repeat right temporal craniotomy for resection of tumor on 12/29/2022 by Dr. Lavonna Monarch at [facility]. This was complicated by bleeding from a branch of the MCA and post-op motor deficits of left side and left facial droop. Follow up MRI showed subjacent extra axial hemorrhage with layering blood products in right temporal lobe resection cavity with small volume SAH and trace cytotoxic edema in resection cavity. Therapy evaluations completed with recommendations for CIR. Patient admitted 01/06/23. SLP consult today due to reports of cognitive changes and biting is tongue when eating.   Pt's wife sent a message to pt's therapists via MyChart on 01/25/23 which stated: Hello Dr. Sundra Aland, this is Adam's wife Shawn York and I wanted to write to you prior to our upcoming visit tomorrow to inform you of the current situation. Deondre is having a hard time following through with simple tasks and it's causing a lot of stress in the home. He does not want to hear how important it is for him to do his PT from me, but he is not doing the exercises like he should. His depression is very severe and is aiding in the current situation at home. I wanted you to know ahead of time so I don't have to bring this up in front of him at our appointment.   PAIN:  Are you having pain? No  FALLS: Has patient fallen in last 6 months?  No  PATIENT GOALS: Return to work  OBJECTIVE:   DIAGNOSTIC FINDINGS:  MR BRAIN WO CONTRAST December 12 2022 IMPRESSION: 1. Changes since 2020 MRI in the residual superior  Right temporal gyrus highly suspicious for Tumor Recurrence, including new mass-like T2/FLAIR hyperintense gyral expansion and small new multifocal nodular enhancement there. No intracranial mass effect 2. Also new since 2020 but chronic appearing lacunar infarcts in the right basal ganglia. 3. No other acute intracranial abnormality.   MR BRAIN WO CONTRAST  Jan 17 2023 IMPRESSION: 1. 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 Previously seen nodular foci of contrast enhancement in the right temporal lobe are no longer visualized. 2. New T2/FLAIR hyperintense signal abnormality in the right corona radiata with associated curvilinear contrast enhancement in these regions. This could potentially represent a region of subacute infarct. Recommend attention on follow-up. 3. Small volume subarachnoid hemorrhage and postoperative subdural hematoma along the right cerebral convexity, as above. 4. 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 5. Dural enhancement and thickening along the left occipital  convexity and likely the left temporal convexity, which may be due to CSF hypotension.  Discharge from SLP service:Jan 17, 2023 Patient has met 1 of 1 long term goals.  Patient to discharge at overall Modified Independent level.  Reasons goals not met: N/A  Clinical Impression/Discharge Summary: Patient has made functional gains and has met 1 of 1 LTGs this admission. Currently, patient requires extra processing time and is overall Mod I for recall of daily information with use of compensatory strategies. Patient continues to report impaired sensation of his oral-motor musculature resulting in him biting his lips and tongue intermittently. However, his strength appears intact  without evidence of dysarthria and with 100% intelligibility. Patient and family education is complete and patient will discharge home with assistance from family. Due to patient's high level of functioning and overall independence at baseline, f/u outpatient SLP services are recommended.  Care Partner:  Caregiver Able to Provide Assistance: Yes  Type of Caregiver Assistance: Cognitive;Physical Recommendation:  Outpatient SLP;24 hour supervision/assistance  Rationale for SLP Follow Up: Maximize cognitive function and independence;Reduce caregiver burden   PATIENT REPORTED OUTCOME MEASURES (PROM): Cognitive Function: returned 02/27/23 and pt scored 91/140, with higher scores indicating lesser impact of cognition on QOL.   TODAY'S TREATMENT:                                                                                                                                         DATE:   03/13/23: Pt intimated that Shawn York might not be able to arrive for therapy appointments mainly because of work but also due to previous experiences with therapists.Pennie Rushing and SLP talked through what might be some ways to talk with Maryellen Pile feelings and Hatem stated he and wife have done this to a small degree.  He has not contacted MD about Ritalin dosage due to locking himself out of MyChart. SLP offered to assist pt in recovering his password but pt assured SLP he would do this at home before Wednesday's appointment but did not put in a reminder to do so.  Pt stated he did not have Temodar in his pillbox for last night so did not take his dose- Maggie put in doses for Thursday (start day) through the rest of the pill box days which ended Saturday. "She put the bottle somewhere I didn't know where it was." He did not want to call Maggie last night to ask where the pill bottle was for chemo due to her already being asleep. SLP helped pt understand he should have called wife and inquired about where med bottle was due to  importance of that meducation. SLP also suggested pt should contact med onc about skipping the dose last night - pt chose to write a reminder to do this, and put it in his "homework" section of his binder.  He decided to barter with making concrete pad - pad will get made  next week and he will give car to the person constructing the pad.  Homework for functional organization.  03/08/23: Pt reports he completed HEP - errors made. SLP demonstrated how to check work more efficiently with highlighting and checking systematically.  Reviewed making list and getting items out BEFORE starting task to increase efficiency and reduce errors. Pt to complete following example for HEP.   Example:  -Paving additional parking lot space in driveway Pt reports anxiety/depression. SLP and pt discussed relationships at home. Pt reports sensory overload, as well as, re: decreased memory of conversations and decreased motivation to complete task lists impacting his relationship. He feels like he is letting his wife down. SLP suggested Shawn York come in for communication strategies for help managing this change in dynamic.  Pt reports he will talk to [facility]. Also encouraged pt to reach out to doc regarding medication re: ritalin, if he feels like it hasn't made much of an impact.    03/06/23: Pt's last page of homework not completed. Tells SLP he double checked his answers however the last two responses on the previous page are not completed either.  Pt tells SLP he forgot a step in replacement of bathroom vanity now requiring him to buy a wrench to remove valves on water lines. SLP worked with pt for a functional compensation and pt agreed, after SLP explanation, that writing out a checklist for remaining steps would be helpful and pt did so with good success. 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  Pt bought pill box after last session  and filled on his own. He did not have anyone double check his accuracy although he states he is chdcking them prior to taking them. SLP told pt to have wife double check his accuracy each time he fills for the next 3-4 weeks, at least.   03/01/23: Pt brought 3-ring binder. Homework 3/5 completed. Pt told SLP he double checked homework but one response in the middle of the homework set was half completed. "I guess I didn't double check that one," Azim stated. He has not obtained a pill box yet so SLP assisted pt in reasoning how he could ensure he would get one and fill it by next session. Pt put a reminder into his phone with min cue and extra time. SLP needed to provide cues for timing of alarm - pt had it set for a time when he may have already been home instead of shortly after rehab visits concluded. SLP asked therapeutic questions relating to anticipatory awareness with a home project and pt stated, "Wow, I'm starting to see how debilitating this is."    02/27/23:Pt acknowledged that the 3-ring binder is helping in keeping him better organized than the folder. Papers were hole-punched and put in the binder. SLP inquired about pt's session Thursday and Quintell told SLP some points using the visual reminder that SLP had written for him. SLP reviewed mental energy/energy conservation with pt. Pt has not obtained a pillbox, states he will do that this week. Pt stated [facility] also initiated a low dose of Ritalin. Please see [facility] note dated 02/22/23 in Care Everywhere for details on dosages. Today SLP assisted pt complete a simple but detailed functional task with simple executive function (time management of errands). Pt req'd min-mod cues for sequencing logically (left groceries in car for other errands), and for time to complete tasks. Homework for simple sequencing of  written steps.  02/23/23: Pt was seen for skilled ST services targeting cognitive-communication. Provided edu on attention strategies re: managing  fatigue to decrease cognitive load, managing distractions, brain breaks. SLP suggested pt get a pillbox to complete on Sundays for energy conservation as pt reports he takes his medicine every day. SLP and pt decided this would reduce pill bottle opening and increase efficiency. Pt is not consistently using alarms as discussed last session - pt reports he will continue to attempt to use this strategy to assist with time management.    02/20/23: Pt indicating challenges with time management. SLP A pt in generating solutions for improved time management and task initiation using his phone as an external aid. Reminders and alarm apps demonstrated with features such as reoccurrence and location. Given example task, pt able to verbalize how he would use external cognitive aid to optimize in 4/4 trials. Led pt through convergent naming task, 75% accuracy, benefiting from cues to consider all details to form response. SLP facilitated generative language task with pt demonstrating need for visual reminders to aid in ability to complete accurately. With visual support and verbal cues to ongoing maintenance towards goal of completing 5, pt with 80% accuracy.   02/17/23: Very flat affect today. See "S" statement re: homework, phone, wallet. SLP asked for PROM which pt found in his therapy folder, not started. SLP told pt to complete for next session and asked him when that was - and pt discovered at this moment he forgot phone at home. Given "S" and pt leaving his phone and wallet at home, SLP took the initial part of the session to assist pt in thinking of a better system to improve organization and planning. Pt unable to generate any feasible ideas so SLP suggested memory notebook/binder to organize his therapy information and achieve better organization in general (see pt instructions). SLP targeted organization and he separated all handouts with extra time and rare min A. He stuffed these back into his folder again and  SLP req'd to provide cues for a more effective organizational strategy - for the time being, clip them together. Pt to hole punch handouts and put in 3-ring binder this weekend.  No dysarthria observed in today's session. Consider providing lingual strength exercises as pt has complained of slurred speech and his tongue feeling "fat" in one previous session.  02/14/23: Pt brought homework folder today, however homework was unfinished as described in "s". Pt was unaware homework was unfinished.  He was 10 minutes late for initial session (OT), reported to SLP "My mom was pushing me out of the house, I was taking a birdbath." When asked about this pt demonstrated reduced anticipatory awareness and stated he "always" had difficulty with time management at work as well - started projects too late or would take more time that he was allotted to complete.  Today SLP cued pt to look at task prior to completing it to familarize himself with the task proir to jumping in. In these detailed functional tasks pt completed with 100% accuracy x2, but third task with 60% success (3/5 correct). He did not double check answers, which was the initial "takeaway" SLP reiterated to pt about not completing his homework. When asked how he could improve his accuracy he told SLP he should have double checked his answers.  02/10/23: Completed HEP but forgot at home. Denied difficulty as primary SLP assisted with initial game plan for execution. Reduced attention noted in conversation with pt fixating on computer  screen. Attention mildly improved following SLP commenting on reduced attention and minimizing visual distractions. Targeted attention to detail task with following specific instruction and decoding message. Pt completed tasks x2 with pt independently noting errors mid-task which skewed result. Pt able to correct with additional time and mod A. Targeted metacognitive skills, in which pt unable to ID solution to aid performance.  SLP prompted marking letters to aid attention and processing, in which pt stated "but that is so slow". Re-educated importance of slow rate to aid processing speed and attention to increase performance. Similar result occurred for second task. Pt verbally identified mistake but did not correct errors without prompt. Mild background noise (typing and voices in gym) was not impactful on performance. Pt reported tongue feels "fat" and reduced left facial sensation, which impacts intelligibility intermittently. Targeted velar sounds at word level for varied placements and increased number of syllables with rare errors noted. Benefited from slower rate for increased articulatory accuracy.   02/03/23: Pt did not return cognitive function PROM. SLP provided pt with targeted attention and organization tasks. Pt worked with detailed instrucutions and mathematical problems and did not double check his answers. Two errors were made which were not noticed by pt. SLP stressed to pt he will need to double check his answers now more than ever before. Pt said he would do so on remaining homework problems. SLP also told pt to download MyChart app to track appointments.   02/01/23: SLP completed the CLQT and discussed results with pt. Organization, planning, memory, and attention appear to be pt's deficit areas for SLP to target. Results are above.  01/26/23: SLP discussed some compensations for mealtime as pt stated he is having difficulty feeling food on the lt labial margin during meals - setting a mirror up in front of him at meals was suggested.  Standardized eval results, as much as pt has completed, were also reviewed.  PATIENT EDUCATION: Education details: see "today's treatment" Person educated: Patient Education method: Explanation, Demonstration, and Verbal cues Education comprehension: verbalized understanding and needs further education   GOALS: Goals reviewed with patient? No  SHORT TERM GOALS: Target  date: 03/03/23  Pt will demo alternating attention with min-mod complex tasks involving language with compensations 90%, in 3 sessions Baseline:02/27/23 Goal status: NOT MET  2.  Pt/family will report pt completes household tasks involving language for 30 consecutive minutes with compensations if necessary, in 3sessions Baseline:  Goal status: NOT MET  3.  Pt will demo use of cognitive (memory, planning/organization, attention, awareness) compensations when performing detailed language tasks in order to achieve 90% success, in 3 sessions Baseline:  Goal status: NOT MET  4.  Pt will demo compensations with POs for reported labial leakage due to facial numbness on lt (including upper/lower lips)  Baseline:  Goal Status: Deferred - Resolved   LONG TERM GOALS: Target date: 03/31/23  Pt will demo alternating attention with min-mod complex work-like tasks involving language (using interruptions) with compensations 100%, in 3 sessions Baseline:  Goal status: REVISED  2.  Pt/family will report pt completes household tasks involving language for 30 minutes with compensations if necessary, in 3 sessions Baseline:  Goal status: REVISED  3.  Pt will demo knowledge of need to employ compensations when performing detailed language tasks in order to achieve 100% success, in 3 sessions Baseline:  Goal status: REVISED  4.  Pt will tell SLP 3 compensations he may need to use to improve accuracy with recall and/or attention Baseline:  Goal  status: REVISED  5.  Pt will score higher on cognitive PROM in the last 1-2 sessions than on initial administration  Baseline:  Goal Status: IN PROGRESS  6. Pt will perform HEP for motor speech with modified independence in 3 sessions  Baseline:  Goal status: INITIAL   ASSESSMENT:  CLINICAL IMPRESSION: Koen's decision to forego contacting wife about missed chemo dose last night due to he couldn't find the med bottle demonstrates decr'd anticipatory  awareness. He was provided oral motor list for velar consonants today (/k/, /g/). See "today's treatment" for more details of today's session. Patient is a 47 y.o. male who was seen today therapy targeting planning, organization, and attention. Pt's motor-speech will also cont to be targeted intermittently in ST - SLP added LTG for HEP for motor speech. Pt's cognition as it stands currently would be detrimental to his job security and pt would benefit from skilled ST to improve these skills.   OBJECTIVE IMPAIRMENTS: include attention, memory, awareness, and executive functioning. These impairments are limiting patient from return to work, managing medications, managing appointments, managing finances, household responsibilities, ADLs/IADLs, and effectively communicating at home and in community. Factors affecting potential to achieve goals and functional outcome are cooperation/participation level (according to wife, Shawn York). Patient will benefit from skilled SLP services to address above impairments and improve overall function.  REHAB POTENTIAL: Good  PLAN:  SLP FREQUENCY: 2x/week  SLP DURATION: 8 weeks (or 17 sessions)  PLANNED INTERVENTIONS: Language facilitation, Environmental controls, Cueing hierachy, Cognitive reorganization, Internal/external aids, Functional tasks, SLP instruction and feedback, Compensatory strategies, and Patient/family education    [facility], CCC-SLP 03/13/2023, 12:05 PM

## 2023-03-15 ENCOUNTER — Ambulatory Visit: Payer: BC Managed Care – PPO

## 2023-03-15 ENCOUNTER — Ambulatory Visit: Payer: BC Managed Care – PPO | Admitting: Occupational Therapy

## 2023-03-15 DIAGNOSIS — I69354 Hemiplegia and hemiparesis following cerebral infarction affecting left non-dominant side: Secondary | ICD-10-CM

## 2023-03-15 DIAGNOSIS — R278 Other lack of coordination: Secondary | ICD-10-CM | POA: Diagnosis not present

## 2023-03-15 DIAGNOSIS — R208 Other disturbances of skin sensation: Secondary | ICD-10-CM | POA: Diagnosis not present

## 2023-03-15 DIAGNOSIS — R4701 Aphasia: Secondary | ICD-10-CM

## 2023-03-15 DIAGNOSIS — M6281 Muscle weakness (generalized): Secondary | ICD-10-CM | POA: Diagnosis not present

## 2023-03-15 DIAGNOSIS — R41841 Cognitive communication deficit: Secondary | ICD-10-CM | POA: Diagnosis not present

## 2023-03-15 DIAGNOSIS — R41840 Attention and concentration deficit: Secondary | ICD-10-CM | POA: Diagnosis not present

## 2023-03-15 NOTE — Therapy (Signed)
OUTPATIENT OCCUPATIONAL THERAPY NEURO TREATMENT NOTE  Patient Name: Shawn York MRN: [id number] DOB:05-21-1976, 47 y.o., male Today's Date: 03/15/2023  PCP: Etta Grandchild, MD REFERRING PROVIDER: Milinda Antis, PA-C  END OF SESSION:  OT End of Session - 03/15/23 1155     Visit Number 13    Number of Visits 17    Date for OT Re-Evaluation 03/24/23    Authorization Type BC[employer]    OT Start Time 1154   arrival time from SLP   OT Stop Time 1232    OT Time Calculation (min) 38 min    Equipment Utilized During Treatment red foam tubing    Activity Tolerance No increased pain;Patient tolerated treatment well;Patient limited by fatigue;Patient limited by pain    Behavior During Therapy [facility] for tasks assessed/performed               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   Depression    Epistaxis    Headache    Hypertension    PTSD (post-traumatic stress disorder)    Seizures (HCC)    Past Surgical History:  Procedure Laterality Date   BASAL CELL CARCINOMA EXCISION  1995   BRAIN SURGERY  12/29/2022   CRANIOTOMY FOR TUMOR Right 02/25/2011   IR ANGIO EXTERNAL CAROTID SEL EXT CAROTID BILAT MOD SED  11/16/2021   IR ANGIO INTRA EXTRACRAN SEL COM CAROTID INNOMINATE UNI R MOD SED  11/16/2021   IR ANGIO INTRA EXTRACRAN SEL INTERNAL CAROTID UNI L MOD SED  11/16/2021   IR ANGIO VERTEBRAL SEL VERTEBRAL UNI R MOD SED  11/16/2021   IR ANGIOGRAM FOLLOW UP STUDY  11/17/2021   IR NEURO EACH ADD'L AFTER BASIC UNI LEFT (MS)  11/17/2021   IR NEURO EACH ADD'L AFTER BASIC UNI RIGHT (MS)  11/17/2021   IR TRANSCATH/EMBOLIZ  11/16/2021   IR US GUIDE VASC ACCESS RIGHT  11/16/2021   RADIOLOGY WITH ANESTHESIA N/A 11/16/2021   Procedure: IR WITH ANESTHESIA;  Surgeon: Baldemar Lenis, MD;  Location: [facility] OR;  Service: Radiology;  Laterality: N/A;   TOTAL HIP ARTHROPLASTY  Right 12/21/2021   Procedure: RIGHT TOTAL HIP REPLACEMENT;  Surgeon: Tarry Kos, MD;  Location: [facility] OR;  Service: Orthopedics;  Laterality: Right;  3-C   WISDOM TOOTH EXTRACTION     Patient Active Problem List   Diagnosis Date Noted   Left hemiparesis (HCC) 02/19/2023   Hemisensory deficit 02/19/2023   Shoulder-hand syndrome 02/19/2023   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 02/08/2023   Depression with anxiety 01/11/2023   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   Coagulation defect, unspecified (HCC) 11/02/2022   Amnestic MCI (mild cognitive impairment with memory loss) 11/02/2022   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) 11/02/2022   Abnormal finding on MRI of brain 05/30/2022   Aneurysm of ascending aorta without rupture (HCC) 02/28/2022   Anemia due to acquired thiamine deficiency 12/01/2021   Seasonal allergic rhinitis due to pollen 11/08/2021   Family history of dissecting aortic aneurysm 11/08/2021   Contrast media allergy 11/08/2021   Avascular necrosis of bone of right hip (HCC) 11/02/2021   Avascular necrosis of bone of left hip (HCC) 11/02/2021   Primary hypertension 05/17/2021   Encounter for general adult medical examination with abnormal findings 05/17/2021   PTSD (post-traumatic stress disorder) 06/05/2018   Nightmares 09/27/2017  Brain tumor, glioma (HCC) 09/27/2017   Severe episode of recurrent major depressive disorder, without psychotic features (HCC)    Major depressive disorder, recurrent episode (HCC) 08/17/2016   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    ONSET DATE: 12/29/22 DOS craniotomy and tumor resection   REFERRING DIAG: C71.2 (ICD-10-CM) - Anaplastic oligodendroglioma of temporal lobe (HCC) I63.112 (ICD-10-CM) - 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  THERAPY DIAG:  Hemiplegia and hemiparesis following cerebral infarction  affecting left non-dominant side (HCC)  Other lack of coordination  Muscle weakness (generalized)  Other disturbances of skin sensation  Attention and concentration deficit  Rationale for Evaluation and Treatment: Rehabilitation  PERTINENT HISTORY: 47 year old male with history of AVN of b-hips s/p R-THR, HTN, PTSD, depression w/anxiety, ascending aortic aneurysm, 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 Adline Peals with 70% removal of tumor. Metronomic Temodar initiated. He completed standard radiation with concurrent temozolomide and Avastin in 2013. MRI on 11/09/2022 suspicious for progression and repeat MRI done 4 weeks later found to have FLAIR lesion. He underwent repeat right temporal craniotomy for resection of tumor on 12/29/2022 by Dr. Lavonna Monarch at [facility] This was complicated by bleeding from a branch of the MCA and post-op motor deficits of left side and left facial droop. Follow up MRI showed subjacent extra axial hemorrhage with layering blood products in right temporal lobe resection cavity with small volume SAH and trace cytotoxic edema in resection cavity.  "This (point to L) arm is a big challenge.  I will tell it to do something and it won't." Reports decreased sensation, limited FMC, limited ability to grade pressure.  Spouse reports that when attempting to complete bimanual tasks, LUE may start task with setup but will not complete task as RUE.  PRECAUTIONS: no lifting or strenuous exercise until cleared by MD - no valsalva   WEIGHT BEARING RESTRICTIONS: No   SUBJECTIVE:   SUBJECTIVE STATEMENT: "I'm getting tired of the static feeling in my hand."   PAIN:  Are you having pain? None at rest today    FALLS: Has patient fallen in last 6 months? 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, break down pallets, Conservation officer, nature  PATIENT GOALS: to be able to play guitar again, not drop things   OBJECTIVE: (All objective assessments below are from initial evaluation on: 01/26/23 unless otherwise specified.)   HAND DOMINANCE: Right  ADLs: Transfers/ambulation related to ADLs: Mod I without AD Eating: would need assist to cut meat, knocks items off table with L hand (use of 2 hands to stabilize and scoop) Grooming: utilizing LUE as gross assist to hold items but not utilizing during task UB Dressing: increased effort LB Dressing: increased effort, attempting to utilize LUE however it will frequently drop things. Difficulty with tying shoes/draw string and buttoning  Toileting: increased time/effort when tearing toilet paper Bathing: difficulty with use of LUE when washing R side of body Tub Shower transfers: Mod I stepping over tub ledge Equipment: Transfer tub bench  IADLs: Light housekeeping: attempted to feed dogs and dropped bowl from L hand Meal Prep: not  currently cooking Community mobility: not cleared to drive Medication management: pt has a system and is able to remember to take as prescribed, difficulty with opening pill bottles  FUNCTIONAL OUTCOME MEASURES: Upper Extremity Functional Scale (UEFS): 18/80 or 22% functional (rated sole use of Lt arm)  UPPER EXTREMITY ROM:  RUE: WFL; LUE: Has active movement - can reach overhead and internal rotation with increased time/effort - difficulty sustaining, and increased compensations noted with fatigue   UPPER EXTREMITY MMT:   grossly 4+ to 5/5 bilaterally  HAND FUNCTION: 03/08/23: Lt hand 44#   02/01/23: Grip: Rt: 125#, Lt: 53#  COORDINATION: Box and Blocks:  Right 42  blocks, Left 12 blocks 02/14/23: L: 20 blocks 03/13/23: L: 26 blocks  03/15/23: L: 2:41 (utilizing sliding of pegs to edge of tray and then sliding up vs picking up from center of tray)  SENSATION: 03/08/23: SWMFT Lt hand: Was able to perceive the 6.65 in median and ulnar nerves,, but was only 70-18% accurate for which finger was being touched within the nerve distribution. This shows progress!    02/01/23: OT tries Semmes-Weinstein Testing and he cannot perceive 6.65 (largest) most of the time globally in Lt hand, and so had diminished or no protective sensation and was cautioned about that.   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  Pt demonstrating difficulty with smaller pegs due to difficulty grading pressure once picking up small pegs.  OT placed towel under pegs to provide increased traction when picking up.  Pt demonstrating improved ease with traction and demonstrating improved ability to grade pressure. 9 hole peg: Pt was able to complete in 2:41, still with difficulty when attempting to pick up small pegs,  therefore frequently sliding towards edge of container and scooping over ledge into hand.   NMR: engaged in picking up large jacks and placing into cup on higher surface to facilitate motor control and grading pressure.  OT reiterating importance of visually attending to LUE during fine and gross motor tasks to compensate for decrease sensation. Sensation: OT reiterated engagement in massage, use of various textures, and use of vibration to facilitate return of sensation in L hand as pt has shown slight improvements and more improvements in forearm.    03/13/23 Hand Gripper: with LUE on level 35# with black spring. Pt picked up 1 inch blocks with gripper with 6/15 drops and min difficulty with sustained grasp to place into container. Completed 2nd time with same resistance with cues to increase visual attention to hand and task,  pt dropping 3/15 during second set.  Increased to level 50# with yellow spring.  Pt dropping 4/15 with cues to visually attend to LUE to facilitate increased attention to task and sustained grasp due to visual inattention to L.  Engaged in discussion to take extra time to ensure proper setup to increase stablity with holding items.   Box and blocks: L: 26 blocks Coordination: engaged in stacking and unstacking blocks with LUE to address coordination, ability to grade pressure with grasp/release.  Pt demonstrating improved motor control and grading pressure with stacking and unstacking only dropping x1. Tool use: engaged in picking up balls with tongs to place onto vertical cones with focus on grading pressure with grasp/release and motor control with placing balls onto cones.  Pt demonstrating decreased sustained attention to LUE especially when removing balls from cones, frequently releasing grasp on ball during transitional movement. OT encouraged pt to pick up, place, and carry styrofoam cup with slits cut into it to further facilitate attention to grasp pressure when picking up  and/or carrying items.   03/08/23: OT starts by educating on fine motor skills for in hand manipulations as listed below.  He struggles with this and was given a large red foam tubing to make it a bit easier.  He also performs bimanual functional activities including folding paper into paper airplane, was encouraged to do laundry folding at home, does a bimanual cutting activity and was encouraged to do art projects with his kids at home.  He performs grip testing which sadly is worse today, but fortunately during Semmes Weinstein monofilament testing he was able to perceive the largest monofilament, which before he was not able to.  Lastly today for pinch strengthening and forced left arm use, he uses blue weighted) to help reach grab and pinch markers and move them to a container.  He does repetitive blocked practice with this, dropping some times and having difficulty managing his pinch and gross coordination.      PATIENT EDUCATION: Education details: ongoing condition specific education.  Educated on sensation and proprioceptive input. Person educated: Patient Education method: Explanation, Demonstration, and Handouts Education comprehension: verbalized understanding and needs further education  HOME EXERCISE PROGRAM: Developmental Motor Sequence (Rood) Do each 2-3 x day, don't hold breath, slowly, 5-10x each Crunches with arms and legs (Flexor-withdrawl) Roll over (toward unaffected side)  "Superman" extension exercises (Pivot prone)       4. Push up/down and Rock side/side on elbows       5. Push up to get to hands and knees   Access Code: [email or web address] URL: [email or web address] Date: 02/03/2023 Prepared by: [facility] - Outpatient  Rehab - [facility]  Access Code: [email or web address] URL: [email or web address] Date: 02/17/2023 Prepared by: [facility] - Outpatient  Rehab - [facility]   GOALS: Goals reviewed with patient? Yes  SHORT TERM GOALS: Target  date: 02/24/23  Pt will be independent in HEP for LUE coordination and ROM. Baseline: Goal status: MET - 02/23/23  2.  Pt will verbalize understanding of task modifications and/or potential AE needs to increase ease, safety, and independence w/ ADLs. Baseline:  Goal status: MET - 02/23/23  3.  Patient will demonstrate improved awareness of visual management strategies for L inattention. Baseline:  Goal status: IN PROGRESS  4.  Pt will demonstrate improved UE functional use for ADLs as evidenced by increasing box/ blocks score by 4 blocks with LUE Baseline: Box and Blocks:  Right 42 blocks, Left 12 blocks Goal status: MET -  L: 20 blocks on 02/14/23   LONG TERM GOALS: Target date: 03/24/23  Pt will demonstrate improved UE functional use for ADLs as evidenced by increasing box/ blocks score by 8 blocks with LUE Baseline: Box and Blocks:  Right 42 blocks, Left 12 blocks Goal status: MET - L: 26 blocks on 03/13/23  2.  Pt will demonstrate improved fine motor coordination for ADLs as evidenced by being able to complete 9 hole peg test with LUE in 2 min time limit. Baseline:  Goal status: IN PROGRESS  3.  Pt will demonstrate improved awareness of safety considerations and AE needs to ensure safety d/t decreased sensation Baseline:  Goal status: IN PROGRESS  4.  Pt will demonstrate improved attention to LUE to allow for sustained grasp to transport household items 25' to increase engagement in IADLs. Baseline:  Goal status: IN PROGRESS  5.  Pt will demonstrate improved functional use of LUE as evidenced by improved score by TBD on UEFS. Baseline: 18/80 or 22% functional (rated sole use of Lt arm) Goal status: IN PROGRESS   ASSESSMENT:  CLINICAL IMPRESSION: Pt more tangential in speech this session, bringing up topics that had nothing to do with what was currently or previously being discussed.  OT reiterated recommendations to engage in more arts and crafts type activities with  children and simple household tools (spatula, tongs, scissors), reinforcing visual attention to compensate for decreased motor control and sensation.  Engaged in discussion of sensory and motor pathways and encouraged continued use of visual compensation for decreased sensation as well as engagement in use of tactile stimulus to promote return.  PLAN:  OT FREQUENCY: 2x/week  OT DURATION: 8 weeks  PLANNED INTERVENTIONS: self care/ADL training, therapeutic exercise, therapeutic activity, neuromuscular re-education, manual therapy, passive range of motion, functional mobility training, splinting, electrical stimulation, ultrasound, compression bandaging, moist heat, cryotherapy, patient/family education, cognitive remediation/compensation, visual/perceptual remediation/compensation, psychosocial skills training, energy conservation, coping strategies training, and DME and/or AE instructions  CONSULTED AND AGREED WITH PLAN OF CARE: Patient and family member/caregiver  PLAN FOR NEXT SESSION:  Continue to challenge him in terms of left arm use decreasing inattention to left arm fine motor skills and gross motor coordination, periodically doing stretches and strengthening as tolerated and appropriate as well.     Rosalio Loud, OTR/L 03/15/2023, 11:55 AM

## 2023-03-15 NOTE — Therapy (Signed)
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   Depression    Epistaxis    Headache    Hypertension    PTSD (post-traumatic stress disorder)    Seizures (HCC)    Past Surgical History:  Procedure Laterality Date   BASAL CELL CARCINOMA EXCISION  1995   BRAIN SURGERY  12/29/2022   CRANIOTOMY FOR TUMOR Right 02/25/2011   IR ANGIO EXTERNAL CAROTID SEL EXT CAROTID BILAT MOD SED  11/16/2021   IR ANGIO INTRA EXTRACRAN SEL COM CAROTID INNOMINATE UNI R MOD SED  11/16/2021   IR ANGIO INTRA EXTRACRAN SEL INTERNAL CAROTID UNI L MOD SED  11/16/2021   IR ANGIO VERTEBRAL SEL VERTEBRAL UNI R MOD SED  11/16/2021   IR ANGIOGRAM FOLLOW UP STUDY  11/17/2021   IR NEURO EACH ADD'L AFTER BASIC UNI LEFT (MS)  11/17/2021   IR NEURO EACH ADD'L AFTER BASIC UNI RIGHT (MS)  11/17/2021   IR TRANSCATH/EMBOLIZ  11/16/2021   IR US GUIDE VASC ACCESS RIGHT  11/16/2021   RADIOLOGY WITH ANESTHESIA N/A 11/16/2021   Procedure: IR WITH ANESTHESIA;  Surgeon: Shawn Lenis, MD;  Location: [facility] OR;  Service: Radiology;  Laterality: N/A;   TOTAL HIP ARTHROPLASTY Right 12/21/2021   Procedure: RIGHT TOTAL HIP REPLACEMENT;  Surgeon: Shawn Kos, MD;  Location: [facility] OR;  Service: Orthopedics;  Laterality: Right;  3-C   WISDOM TOOTH EXTRACTION     Patient Active Problem  List   Diagnosis Date Noted   Left hemiparesis (HCC) 02/19/2023   Hemisensory deficit 02/19/2023   Shoulder-hand syndrome 02/19/2023   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 02/08/2023   Depression with anxiety 01/11/2023   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   Coagulation defect, unspecified (HCC) 11/02/2022   Amnestic MCI (mild cognitive impairment with memory loss) 11/02/2022   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) 11/02/2022   Abnormal finding on MRI of brain 05/30/2022   Aneurysm of ascending aorta without rupture (HCC) 02/28/2022   Anemia due to acquired thiamine deficiency 12/01/2021   Seasonal allergic rhinitis due to pollen 11/08/2021   Family history of dissecting aortic aneurysm 11/08/2021   Contrast media allergy 11/08/2021   Avascular necrosis of bone of right hip (HCC) 11/02/2021   Avascular necrosis of bone of left hip (HCC) 11/02/2021   Primary hypertension 05/17/2021   Encounter for general adult medical examination with abnormal findings 05/17/2021   PTSD (post-traumatic stress disorder) 06/05/2018   Nightmares 09/27/2017   Brain tumor, glioma (HCC) 09/27/2017   Severe episode of recurrent major depressive disorder, without psychotic features (HCC)    Major depressive disorder, recurrent episode (HCC) 08/17/2016   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    ONSET DATE: 12/29/22  REFERRING DIAG: C71.2 (ICD-10-CM) - Anaplastic oligodendroglioma of temporal lobe (HCC) I63.112 (ICD-10-CM) - 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 Adline York with 70% removal of tumor. Metronomic Temodar initiated. He completed standard radiation with concurrent temozolomide and Shawn York in 2013. MRI on 11/09/2022 suspicious for progression and repeat MRI done 4 weeks later found to have FLAIR lesion. He underwent repeat right temporal craniotomy for resection of tumor on 12/29/2022 by Dr. Lavonna York at [facility]. This was complicated by bleeding from a branch of the MCA and post-op motor deficits of left side and left facial droop. Follow up MRI showed subjacent extra axial hemorrhage with layering blood products in right temporal lobe resection cavity with small volume SAH and trace cytotoxic edema in resection cavity. Therapy evaluations completed with recommendations for CIR. Patient admitted 01/06/23. SLP consult today due to reports of cognitive changes and biting is tongue when eating.   Pt's wife sent a message to pt's therapists via MyChart on 01/25/23 which stated: Hello Shawn York, this is Shawn York's wife Shawn York and I wanted to write to you prior to our upcoming visit tomorrow to inform you of the current situation. Michial is having a hard time following through with simple tasks and it's causing a lot of stress in the home. He does not want to hear how important it is for him to do his PT from me, but he is not doing the exercises like he should. His depression is very severe and is aiding in the current situation at home. I wanted you to know ahead of time so I don't have to bring this up in front of him at our appointment.   PAIN:  Are you having pain? No  FALLS: Has patient fallen in last 6 months?  No  PATIENT GOALS: Return to work  OBJECTIVE:   DIAGNOSTIC FINDINGS:  MR BRAIN WO CONTRAST December 12 2022 IMPRESSION: 1. Changes since 2020 MRI in the residual superior  Right temporal gyrus highly suspicious for Tumor Recurrence, including new mass-like T2/FLAIR hyperintense gyral expansion and small new multifocal nodular enhancement there. No intracranial mass effect 2. Also new since 2020 but chronic appearing lacunar infarcts in the right basal ganglia. 3. No other acute intracranial abnormality.   MR BRAIN WO CONTRAST  Jan 17 2023 IMPRESSION: 1. 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 Previously seen nodular foci of contrast enhancement in the right temporal lobe are no longer visualized. 2. New T2/FLAIR hyperintense signal abnormality in the right corona radiata with associated curvilinear contrast enhancement in these regions. This could potentially represent a region of subacute infarct. Recommend attention on follow-up. 3. Small volume subarachnoid hemorrhage and postoperative subdural hematoma along the right cerebral convexity, as above. 4. 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 5. Dural enhancement and thickening along the left occipital  convexity and likely the left temporal convexity, which may be due to CSF hypotension.  Discharge from SLP service:Jan 17, 2023 Patient has met 1 of 1 long term goals.  Patient to discharge at overall Modified Independent level.  Reasons goals not met: N/A  Clinical Impression/Discharge Summary: Patient has made functional gains and has met 1 of 1 LTGs this admission. Currently, patient requires extra processing time and is overall Mod I for recall of daily information with use of compensatory strategies. Patient continues to report impaired sensation of his oral-motor musculature resulting in him biting his lips and tongue intermittently. However, his strength appears intact  without evidence of dysarthria and with 100% intelligibility. Patient and family education is complete and patient will discharge home with assistance from family. Due to patient's high level of functioning and overall independence at baseline, f/u outpatient SLP services are recommended.  Care Partner:  Caregiver Able to Provide Assistance: Yes  Type of Caregiver Assistance: Cognitive;Physical Recommendation:  Outpatient SLP;24 hour supervision/assistance  Rationale for SLP Follow Up: Maximize cognitive function and independence;Reduce caregiver burden   PATIENT REPORTED OUTCOME MEASURES (PROM): Cognitive Function: returned 02/27/23 and pt scored 91/140, with higher scores indicating lesser impact of cognition on QOL.   TODAY'S TREATMENT:                                                                                                                                         DATE:   03/15/23: "I did get my MyChart opened back up." Pt did not provide homework to SLP when arriving into ST room. SLP focused treatment today on attention and attention to detail with "organizing your day" task. Pt req'd initial mod cues to begin working on the task. Mod cues necessary to self correct and find errors, which pt did when he double checked. SLP reviewed strategies for emergent awareness. Later in session SLP asked pt if he had homework and he said he did not but looked in his binder and found he had homework. SLP reiterated need to look in notebook every day for items he may have missed.  03/13/23: Pt intimated that Shawn York might not be able to arrive for therapy appointments mainly because of work but also due to previous experiences with therapists.Pennie Rushing and SLP talked through what might be some ways to talk with Maryellen Pile feelings and Jamorris stated he and wife have done this to a small degree.  He has not contacted MD about Ritalin dosage due to locking himself out of MyChart. SLP offered to assist pt in  recovering his password but pt assured SLP he would do this at home before Wednesday's appointment but did not put in a reminder to do so.  Pt stated he did not have Temodar in his pillbox for last night so did not take his dose- Maggie put in doses for Thursday (start day) through the rest of the  pill box days which ended Saturday. "She put the bottle somewhere I didn't know where it was." He did not want to call Maggie last night to ask where the pill bottle was for chemo due to her already being asleep. SLP helped pt understand he should have called wife and inquired about where med bottle was due to importance of that meducation. SLP also suggested pt should contact med onc about skipping the dose last night - pt chose to write a reminder to do this, and put it in his "homework" section of his binder.  He decided to barter with making concrete pad - pad will get made next week and he will give car to the person constructing the pad.  Homework for functional organization.  03/08/23: Pt reports he completed HEP - errors made. SLP demonstrated how to check work more efficiently with highlighting and checking systematically.  Reviewed making list and getting items out BEFORE starting task to increase efficiency and reduce errors. Pt to complete following example for HEP.   Example:  -Paving additional parking lot space in driveway Pt reports anxiety/depression. SLP and pt discussed relationships at home. Pt reports sensory overload, as well as, re: decreased memory of conversations and decreased motivation to complete task lists impacting his relationship. He feels like he is letting his wife down. SLP suggested Shawn York come in for communication strategies for help managing this change in dynamic.  Pt reports he will talk to [facility]. Also encouraged pt to reach out to doc regarding medication re: ritalin, if he feels like it hasn't made much of an impact.    03/06/23: Pt's last page of homework not  completed. Tells SLP he double checked his answers however the last two responses on the previous page are not completed either.  Pt tells SLP he forgot a step in replacement of bathroom vanity now requiring him to buy a wrench to remove valves on water lines. SLP worked with pt for a functional compensation and pt agreed, after SLP explanation, that writing out a checklist for remaining steps would be helpful and pt did so with good success. 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  Pt bought pill box after last session and filled on his own. He did not have anyone double check his accuracy although he states he is chdcking them prior to taking them. SLP told pt to have wife double check his accuracy each time he fills for the next 3-4 weeks, at least.   03/01/23: Pt brought 3-ring binder. Homework 3/5 completed. Pt told SLP he double checked homework but one response in the middle of the homework set was half completed. "I guess I didn't double check that one," Shawn York stated. He has not obtained a pill box yet so SLP assisted pt in reasoning how he could ensure he would get one and fill it by next session. Pt put a reminder into his phone with min cue and extra time. SLP needed to provide cues for timing of alarm - pt had it set for a time when he may have already been home instead of shortly after rehab visits concluded. SLP asked therapeutic questions relating to anticipatory awareness with a home project and pt stated, "Wow, I'm starting to see how debilitating this is."    02/27/23:Pt acknowledged that the 3-ring binder is helping in keeping him better organized than the folder. Papers were hole-punched and put in  the binder. SLP inquired about pt's session Thursday and Jemario told SLP some points using the visual reminder that SLP had written for him. SLP reviewed mental energy/energy conservation with pt. Pt has not  obtained a pillbox, states he will do that this week. Pt stated [facility] also initiated a low dose of Ritalin. Please see [facility] note dated 02/22/23 in Care Everywhere for details on dosages. Today SLP assisted pt complete a simple but detailed functional task with simple executive function (time management of errands). Pt req'd min-mod cues for sequencing logically (left groceries in car for other errands), and for time to complete tasks. Homework for simple sequencing of written steps.  02/23/23: Pt was seen for skilled ST services targeting cognitive-communication. Provided edu on attention strategies re: managing fatigue to decrease cognitive load, managing distractions, brain breaks. SLP suggested pt get a pillbox to complete on Sundays for energy conservation as pt reports he takes his medicine every day. SLP and pt decided this would reduce pill bottle opening and increase efficiency. Pt is not consistently using alarms as discussed last session - pt reports he will continue to attempt to use this strategy to assist with time management.    02/20/23: Pt indicating challenges with time management. SLP A pt in generating solutions for improved time management and task initiation using his phone as an external aid. Reminders and alarm apps demonstrated with features such as reoccurrence and location. Given example task, pt able to verbalize how he would use external cognitive aid to optimize in 4/4 trials. Led pt through convergent naming task, 75% accuracy, benefiting from cues to consider all details to form response. SLP facilitated generative language task with pt demonstrating need for visual reminders to aid in ability to complete accurately. With visual support and verbal cues to ongoing maintenance towards goal of completing 5, pt with 80% accuracy.   02/17/23: Very flat affect today. See "S" statement re: homework, phone, wallet. SLP asked for PROM which pt found in his therapy folder, not started. SLP  told pt to complete for next session and asked him when that was - and pt discovered at this moment he forgot phone at home. Given "S" and pt leaving his phone and wallet at home, SLP took the initial part of the session to assist pt in thinking of a better system to improve organization and planning. Pt unable to generate any feasible ideas so SLP suggested memory notebook/binder to organize his therapy information and achieve better organization in general (see pt instructions). SLP targeted organization and he separated all handouts with extra time and rare min A. He stuffed these back into his folder again and SLP req'd to provide cues for a more effective organizational strategy - for the time being, clip them together. Pt to hole punch handouts and put in 3-ring binder this weekend.  No dysarthria observed in today's session. Consider providing lingual strength exercises as pt has complained of slurred speech and his tongue feeling "fat" in one previous session.  02/14/23: Pt brought homework folder today, however homework was unfinished as described in "s". Pt was unaware homework was unfinished.  He was 10 minutes late for initial session (OT), reported to SLP "My mom was pushing me out of the house, I was taking a birdbath." When asked about this pt demonstrated reduced anticipatory awareness and stated he "always" had difficulty with time management at work as well - started projects too late or would take more time that he was allotted to  complete.  Today SLP cued pt to look at task prior to completing it to familarize himself with the task proir to jumping in. In these detailed functional tasks pt completed with 100% accuracy x2, but third task with 60% success (3/5 correct). He did not double check answers, which was the initial "takeaway" SLP reiterated to pt about not completing his homework. When asked how he could improve his accuracy he told SLP he should have double checked his  answers.  02/10/23: Completed HEP but forgot at home. Denied difficulty as primary SLP assisted with initial game plan for execution. Reduced attention noted in conversation with pt fixating on computer screen. Attention mildly improved following SLP commenting on reduced attention and minimizing visual distractions. Targeted attention to detail task with following specific instruction and decoding message. Pt completed tasks x2 with pt independently noting errors mid-task which skewed result. Pt able to correct with additional time and mod A. Targeted metacognitive skills, in which pt unable to ID solution to aid performance. SLP prompted marking letters to aid attention and processing, in which pt stated "but that is so slow". Re-educated importance of slow rate to aid processing speed and attention to increase performance. Similar result occurred for second task. Pt verbally identified mistake but did not correct errors without prompt. Mild background noise (typing and voices in gym) was not impactful on performance. Pt reported tongue feels "fat" and reduced left facial sensation, which impacts intelligibility intermittently. Targeted velar sounds at word level for varied placements and increased number of syllables with rare errors noted. Benefited from slower rate for increased articulatory accuracy.   02/03/23: Pt did not return cognitive function PROM. SLP provided pt with targeted attention and organization tasks. Pt worked with detailed instrucutions and mathematical problems and did not double check his answers. Two errors were made which were not noticed by pt. SLP stressed to pt he will need to double check his answers now more than ever before. Pt said he would do so on remaining homework problems. SLP also told pt to download MyChart app to track appointments.   02/01/23: SLP completed the CLQT and discussed results with pt. Organization, planning, memory, and attention appear to be pt's deficit  areas for SLP to target. Results are above.  01/26/23: SLP discussed some compensations for mealtime as pt stated he is having difficulty feeling food on the lt labial margin during meals - setting a mirror up in front of him at meals was suggested.  Standardized eval results, as much as pt has completed, were also reviewed.  PATIENT EDUCATION: Education details: see "today's treatment" Person educated: Patient Education method: Explanation, Demonstration, and Verbal cues Education comprehension: verbalized understanding and needs further education   GOALS: Goals reviewed with patient? No  SHORT TERM GOALS: Target date: 03/03/23  Pt will demo alternating attention with min-mod complex tasks involving language with compensations 90%, in 3 sessions Baseline:02/27/23 Goal status: NOT MET  2.  Pt/family will report pt completes household tasks involving language for 30 consecutive minutes with compensations if necessary, in 3sessions Baseline:  Goal status: NOT MET  3.  Pt will demo use of cognitive (memory, planning/organization, attention, awareness) compensations when performing detailed language tasks in order to achieve 90% success, in 3 sessions Baseline:  Goal status: NOT MET  4.  Pt will demo compensations with POs for reported labial leakage due to facial numbness on lt (including upper/lower lips)  Baseline:  Goal Status: Deferred - Resolved   LONG TERM GOALS:  Target date: 03/31/23  Pt will demo alternating attention with min-mod complex work-like tasks involving language (using interruptions) with compensations 100%, in 3 sessions Baseline:  Goal status: REVISED  2.  Pt/family will report pt completes household tasks involving language for 30 minutes with compensations if necessary, in 3 sessions Baseline:  Goal status: REVISED  3.  Pt will demo knowledge of need to employ compensations when performing detailed language tasks in order to achieve 100% success, in 3  sessions Baseline:  Goal status: REVISED  4.  Pt will tell SLP 3 compensations he may need to use to improve accuracy with recall and/or attention Baseline:  Goal status: REVISED  5.  Pt will score higher on cognitive PROM in the last 1-2 sessions than on initial administration  Baseline:  Goal Status: IN PROGRESS  6. Pt will perform HEP for motor speech with modified independence in 3 sessions  Baseline:  Goal status: INITIAL   ASSESSMENT:  CLINICAL IMPRESSION: Shawn York's decision to forego contacting wife about missed chemo dose last night due to he couldn't find the med bottle demonstrates decr'd anticipatory awareness. He was provided oral motor list for velar consonants today (/k/, /g/). See "today's treatment" for more details of today's session. Patient is a 46 y.o. male who was seen today therapy targeting planning, organization, and attention. Pt's motor-speech will also cont to be targeted intermittently in ST - SLP added LTG for HEP for motor speech. Pt's cognition as it stands currently would be detrimental to his job security and pt would benefit from skilled ST to improve these skills.   OBJECTIVE IMPAIRMENTS: include attention, memory, awareness, and executive functioning. These impairments are limiting patient from return to work, managing medications, managing appointments, managing finances, household responsibilities, ADLs/IADLs, and effectively communicating at home and in community. Factors affecting potential to achieve goals and functional outcome are cooperation/participation level (according to wife, Shawn York). Patient will benefit from skilled SLP services to address above impairments and improve overall function.  REHAB POTENTIAL: Good  PLAN:  SLP FREQUENCY: 2x/week  SLP DURATION: 8 weeks (or 17 sessions)  PLANNED INTERVENTIONS: Language facilitation, Environmental controls, Cueing hierachy, Cognitive reorganization, Internal/external aids, Functional tasks, SLP  instruction and feedback, Compensatory strategies, and Patient/family education    [facility], CCC-SLP 03/15/2023, 11:21 AM

## 2023-03-20 ENCOUNTER — Ambulatory Visit: Payer: BC Managed Care – PPO | Admitting: Occupational Therapy

## 2023-03-20 DIAGNOSIS — I69354 Hemiplegia and hemiparesis following cerebral infarction affecting left non-dominant side: Secondary | ICD-10-CM

## 2023-03-20 DIAGNOSIS — R41840 Attention and concentration deficit: Secondary | ICD-10-CM | POA: Diagnosis not present

## 2023-03-20 DIAGNOSIS — R278 Other lack of coordination: Secondary | ICD-10-CM | POA: Diagnosis not present

## 2023-03-20 DIAGNOSIS — R208 Other disturbances of skin sensation: Secondary | ICD-10-CM | POA: Diagnosis not present

## 2023-03-20 DIAGNOSIS — R4701 Aphasia: Secondary | ICD-10-CM | POA: Diagnosis not present

## 2023-03-20 DIAGNOSIS — M6281 Muscle weakness (generalized): Secondary | ICD-10-CM | POA: Diagnosis not present

## 2023-03-20 DIAGNOSIS — R41841 Cognitive communication deficit: Secondary | ICD-10-CM | POA: Diagnosis not present

## 2023-03-20 NOTE — Therapy (Signed)
OUTPATIENT OCCUPATIONAL THERAPY NEURO TREATMENT NOTE  Patient Name: Shawn York MRN: [id number] DOB:1976/03/25, 47 y.o., male Today's Date: 03/20/2023  PCP: Etta Grandchild, MD REFERRING PROVIDER: Milinda Antis, PA-C  END OF SESSION:  OT End of Session - 03/20/23 1201     Visit Number 14    Number of Visits 17    Date for OT Re-Evaluation 03/24/23    Authorization Type [employer]    OT Start Time 1152    OT Stop Time 1232    OT Time Calculation (min) 40 min    Equipment Utilized During Treatment --    Activity Tolerance No increased pain;Patient tolerated treatment well;Patient limited by fatigue;Patient limited by pain    Behavior During Therapy [facility] for tasks assessed/performed                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   Depression    Epistaxis    Headache    Hypertension    PTSD (post-traumatic stress disorder)    Seizures (HCC)    Past Surgical History:  Procedure Laterality Date   BASAL CELL CARCINOMA EXCISION  1995   BRAIN SURGERY  12/29/2022   CRANIOTOMY FOR TUMOR Right 02/25/2011   IR ANGIO EXTERNAL CAROTID SEL EXT CAROTID BILAT MOD SED  11/16/2021   IR ANGIO INTRA EXTRACRAN SEL COM CAROTID INNOMINATE UNI R MOD SED  11/16/2021   IR ANGIO INTRA EXTRACRAN SEL INTERNAL CAROTID UNI L MOD SED  11/16/2021   IR ANGIO VERTEBRAL SEL VERTEBRAL UNI R MOD SED  11/16/2021   IR ANGIOGRAM FOLLOW UP STUDY  11/17/2021   IR NEURO EACH ADD'L AFTER BASIC UNI LEFT (MS)  11/17/2021   IR NEURO EACH ADD'L AFTER BASIC UNI RIGHT (MS)  11/17/2021   IR TRANSCATH/EMBOLIZ  11/16/2021   IR US GUIDE VASC ACCESS RIGHT  11/16/2021   RADIOLOGY WITH ANESTHESIA N/A 11/16/2021   Procedure: IR WITH ANESTHESIA;  Surgeon: Baldemar Lenis, MD;  Location: [facility] OR;  Service: Radiology;  Laterality: N/A;   TOTAL HIP ARTHROPLASTY Right 12/21/2021   Procedure: RIGHT  TOTAL HIP REPLACEMENT;  Surgeon: Tarry Kos, MD;  Location: [facility] OR;  Service: Orthopedics;  Laterality: Right;  3-C   WISDOM TOOTH EXTRACTION     Patient Active Problem List   Diagnosis Date Noted   Left hemiparesis (HCC) 02/19/2023   Hemisensory deficit 02/19/2023   Shoulder-hand syndrome 02/19/2023   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 02/08/2023   Depression with anxiety 01/11/2023   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   Coagulation defect, unspecified (HCC) 11/02/2022   Amnestic MCI (mild cognitive impairment with memory loss) 11/02/2022   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) 11/02/2022   Abnormal finding on MRI of brain 05/30/2022   Aneurysm of ascending aorta without rupture (HCC) 02/28/2022   Anemia due to acquired thiamine deficiency 12/01/2021   Seasonal allergic rhinitis due to pollen 11/08/2021   Family history of dissecting aortic aneurysm 11/08/2021   Contrast media allergy 11/08/2021   Avascular necrosis of bone of right hip (HCC) 11/02/2021   Avascular necrosis of bone of left hip (HCC) 11/02/2021   Primary hypertension 05/17/2021   Encounter for general adult medical examination with abnormal findings 05/17/2021   PTSD (post-traumatic stress disorder) 06/05/2018   Nightmares 09/27/2017   Brain tumor, glioma (HCC)  09/27/2017   Severe episode of recurrent major depressive disorder, without psychotic features (HCC)    Major depressive disorder, recurrent episode (HCC) 08/17/2016   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    ONSET DATE: 12/29/22 DOS craniotomy and tumor resection   REFERRING DIAG: C71.2 (ICD-10-CM) - Anaplastic oligodendroglioma of temporal lobe (HCC) I63.112 (ICD-10-CM) - 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  THERAPY DIAG:  Hemiplegia and hemiparesis following cerebral infarction affecting left non-dominant side  (HCC)  Other lack of coordination  Muscle weakness (generalized)  Other disturbances of skin sensation  Attention and concentration deficit  Rationale for Evaluation and Treatment: Rehabilitation  PERTINENT HISTORY: 47 year old male with history of AVN of b-hips s/p R-THR, HTN, PTSD, depression w/anxiety, ascending aortic aneurysm, 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 Adline Peals with 70% removal of tumor. Metronomic Temodar initiated. He completed standard radiation with concurrent temozolomide and Avastin in 2013. MRI on 11/09/2022 suspicious for progression and repeat MRI done 4 weeks later found to have FLAIR lesion. He underwent repeat right temporal craniotomy for resection of tumor on 12/29/2022 by Dr. Lavonna Monarch at [facility]. This was complicated by bleeding from a branch of the MCA and post-op motor deficits of left side and left facial droop. Follow up MRI showed subjacent extra axial hemorrhage with layering blood products in right temporal lobe resection cavity with small volume SAH and trace cytotoxic edema in resection cavity.  "This (point to L) arm is a big challenge.  I will tell it to do something and it won't." Reports decreased sensation, limited FMC, limited ability to grade pressure.  Spouse reports that when attempting to complete bimanual tasks, LUE may start task with setup but will not complete task as RUE.  PRECAUTIONS: no lifting or strenuous exercise until cleared by MD - no valsalva   WEIGHT BEARING RESTRICTIONS: No   SUBJECTIVE:   SUBJECTIVE STATEMENT: "I played a little guitar over the weekend.  It was a little tedious but was able to make the notes."   PAIN:  Are you having pain? None at rest today    FALLS: Has patient fallen in last 6 months? 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, break down pallets, Conservation officer, nature  PATIENT GOALS: to be able to play guitar again, not drop things   OBJECTIVE: (All objective assessments below are from initial evaluation on: 01/26/23 unless otherwise specified.)   HAND DOMINANCE: Right  ADLs: Transfers/ambulation related to ADLs: Mod I without AD Eating: would need assist to cut meat, knocks items off table with L hand (use of 2 hands to stabilize and scoop) Grooming: utilizing LUE as gross assist to hold items but not utilizing during task UB Dressing: increased effort LB Dressing: increased effort, attempting to utilize LUE however it will frequently drop things. Difficulty with tying shoes/draw string and buttoning  Toileting: increased time/effort when tearing toilet paper Bathing: difficulty with use of LUE when washing R side of body Tub Shower transfers: Mod I stepping over tub ledge Equipment: Transfer tub bench  IADLs: Light housekeeping: attempted to feed dogs and dropped  bowl from L hand Meal Prep: not currently cooking Community mobility: not cleared to drive Medication management: pt has a system and is able to remember to take as prescribed, difficulty with opening pill bottles  FUNCTIONAL OUTCOME MEASURES: Upper Extremity Functional Scale (UEFS): 18/80 or 22% functional (rated sole use of Lt arm)  UPPER EXTREMITY ROM:  RUE: WFL; LUE: Has active movement - can reach overhead and internal rotation with increased time/effort - difficulty sustaining, and increased compensations noted with fatigue   UPPER EXTREMITY MMT:   grossly 4+ to 5/5 bilaterally  HAND FUNCTION: 03/08/23: Lt hand 44#   02/01/23: Grip: Rt: 125#, Lt: 53#  COORDINATION: Box and  Blocks:  Right 42 blocks, Left 12 blocks 02/14/23: L: 20 blocks 03/13/23: L: 26 blocks  03/15/23: L: 2:41 (utilizing sliding of pegs to edge of tray and then sliding up vs picking up from center of tray)  SENSATION: 03/08/23: SWMFT Lt hand: Was able to perceive the 6.65 in median and ulnar nerves,, but was only 70-18% accurate for which finger was being touched within the nerve distribution. This shows progress!    02/01/23: OT tries Semmes-Weinstein Testing and he cannot perceive 6.65 (largest) most of the time globally in Lt hand, and so had diminished or no protective sensation and was cautioned about that.   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  Pt frequently pressing pad down with pad of fingers, encouraged pt to attempt finger tip for increased precision to carry over to playing guitar.  Small finger and ring finger sliding off when pressed as well.   Complex planning: discussed meal prep with pt reporting that he would do the shopping and the cooking, but that his wife would make the menu/meal plan and add items to the grocery  app.  Pt reports that she would include amounts if needing multiple of something.  Therefore transitioned to planning/organization of day.  Completed complex planning/problem solving prompt requiring pt to organize items into systematic schedule to complete all in time allotted.  Discussed use of online ordering, curb side pickup, and use of digital resources to assist as needed. Pt requiring increased time to setup task, utilizing 30 min increments when organizing.   03/15/23 Coordination: engaged in placing various sized pegs into peg board progressing from larger to smaller pegs.  Pt demonstrating difficulty with smaller pegs due to difficulty grading pressure once picking up small pegs.  OT placed towel under pegs to provide increased traction when picking up.  Pt demonstrating improved ease with traction and demonstrating improved  ability to grade pressure. 9 hole peg: Pt was able to complete in 2:41, still with difficulty when attempting to pick up small pegs, therefore frequently sliding towards edge of container and scooping over ledge into hand.   NMR: engaged in picking up large jacks and placing into cup on higher surface to facilitate motor control and grading pressure.  OT reiterating importance of visually attending to LUE during fine and gross motor tasks to compensate for decrease sensation. Sensation: OT reiterated engagement in massage, use of various textures, and use of vibration to facilitate return of sensation in L hand as pt has shown slight improvements and more improvements in forearm.    03/13/23 Hand Gripper: with LUE on level 35# with black spring. Pt picked up 1 inch blocks with gripper with 6/15 drops and min difficulty with sustained grasp to place into container. Completed 2nd time with same resistance with cues to increase visual attention to hand and task, pt dropping 3/15 during second set.  Increased to level 50# with yellow spring.  Pt dropping 4/15 with cues to  visually attend to LUE to facilitate increased attention to task and sustained grasp due to visual inattention to L.  Engaged in discussion to take extra time to ensure proper setup to increase stablity with holding items.   Box and blocks: L: 26 blocks Coordination: engaged in stacking and unstacking blocks with LUE to address coordination, ability to grade pressure with grasp/release.  Pt demonstrating improved motor control and grading pressure with stacking and unstacking only dropping x1. Tool use: engaged in picking up balls with tongs to place onto vertical cones with focus on grading pressure with grasp/release and motor control with placing balls onto cones.  Pt demonstrating decreased sustained attention to LUE especially when removing balls from cones, frequently releasing grasp on ball during transitional movement. OT encouraged pt to pick up, place, and carry styrofoam cup with slits cut into it to further facilitate attention to grasp pressure when picking up and/or carrying items.     PATIENT EDUCATION: Education details: ongoing condition specific education.  Educated on sensation and proprioceptive input. Person educated: Patient Education method: Explanation, Demonstration, and Handouts Education comprehension: verbalized understanding and needs further education  HOME EXERCISE PROGRAM: Developmental Motor Sequence (Rood) Do each 2-3 x day, don't hold breath, slowly, 5-10x each Crunches with arms and legs (Flexor-withdrawl) Roll over (toward unaffected side)  "Superman" extension exercises (Pivot prone)       4. Push up/down and Rock side/side on elbows       5. Push up to get to hands and knees   Access Code: [email or web address] URL: [email or web address] Date: 02/03/2023 Prepared by: [facility] - Outpatient  Rehab - [facility]  Access Code: [email or web address] URL: [email or web address] Date: 02/17/2023 Prepared by: [facility] - Outpatient  Rehab - [facility]   GOALS: Goals reviewed with patient? Yes  SHORT TERM GOALS: Target date: 02/24/23  Pt will be independent in HEP for LUE coordination and ROM. Baseline: Goal status: MET - 02/23/23  2.  Pt will verbalize understanding of task modifications and/or potential AE needs to increase ease, safety, and independence w/ ADLs. Baseline:  Goal status: MET - 02/23/23  3.  Patient will demonstrate improved awareness of visual management strategies for L inattention. Baseline:  Goal status: IN PROGRESS  4.  Pt will demonstrate improved UE functional use for ADLs as evidenced by increasing box/ blocks score by 4 blocks with LUE Baseline: Box and Blocks:  Right 42  blocks, Left 12 blocks Goal status: MET - L: 20 blocks on 02/14/23   LONG TERM GOALS: Target date: 03/24/23  Pt will demonstrate improved UE functional use for ADLs as evidenced by increasing box/ blocks score by 8 blocks with LUE Baseline: Box and Blocks:  Right 42 blocks, Left 12 blocks Goal status: MET - L: 26 blocks on 03/13/23  2.  Pt will demonstrate improved fine motor coordination for ADLs as evidenced by being able to complete 9 hole peg test with LUE in 2 min time limit. Baseline:  Goal status: IN PROGRESS  3.  Pt will demonstrate improved awareness of safety considerations and AE needs to ensure safety d/t decreased sensation Baseline:  Goal status: IN PROGRESS  4.  Pt will demonstrate improved attention to LUE to allow for sustained grasp to transport household items 25' to increase engagement in IADLs. Baseline:  Goal status: IN PROGRESS  5.  Pt will demonstrate improved functional use of LUE as evidenced by improved score by TBD on UEFS. Baseline: 18/80 or 22% functional (rated sole use of Lt arm) Goal status: IN PROGRESS   ASSESSMENT:  CLINICAL IMPRESSION: Pt is making some progress with functional use of LUE, demonstrating sustained grasp on items with L hand this session (when carrying notebook  to/from treatment space) and reports of improved control to slowly play chords on guitar.  Pt continues to demonstrate decreased ability to grade pressure with holding items and using digiflex this session.  Pt with decreased awareness of ongoing impairments as pt expressing desire to return to work and asking who might sign off on form for him to resume working.  Pt will continue to benefit from OT services to address safety awareness, sensation, fine and gross motor control.  PLAN:  OT FREQUENCY: 2x/week  OT DURATION: 8 weeks  PLANNED INTERVENTIONS: self care/ADL training, therapeutic exercise, therapeutic activity, neuromuscular re-education, manual therapy, passive range of motion, functional mobility training, splinting, electrical stimulation, ultrasound, compression bandaging, moist heat, cryotherapy, patient/family education, cognitive remediation/compensation, visual/perceptual remediation/compensation, psychosocial skills training, energy conservation, coping strategies training, and DME and/or AE instructions  CONSULTED AND AGREED WITH PLAN OF CARE: Patient and family member/caregiver  PLAN FOR NEXT SESSION:  Continue to challenge him in terms of left arm use decreasing inattention to left arm fine motor skills and gross motor coordination, periodically doing stretches and strengthening as tolerated and appropriate as well.     Rosalio Loud, OTR/L 03/20/2023, 12:07 PM

## 2023-03-21 ENCOUNTER — Encounter: Payer: Self-pay | Admitting: Internal Medicine

## 2023-03-21 ENCOUNTER — Encounter: Payer: Self-pay | Admitting: Physical Medicine and Rehabilitation

## 2023-03-21 ENCOUNTER — Other Ambulatory Visit: Payer: Self-pay | Admitting: Neurology

## 2023-03-21 DIAGNOSIS — C719 Malignant neoplasm of brain, unspecified: Secondary | ICD-10-CM

## 2023-03-21 NOTE — Telephone Encounter (Signed)
Pt called in to check and see what was Dr. Yetta Barre response was.Please advise.

## 2023-03-22 ENCOUNTER — Ambulatory Visit: Payer: BC Managed Care – PPO

## 2023-03-22 ENCOUNTER — Ambulatory Visit: Payer: BC Managed Care – PPO | Admitting: Occupational Therapy

## 2023-03-22 DIAGNOSIS — R208 Other disturbances of skin sensation: Secondary | ICD-10-CM | POA: Diagnosis not present

## 2023-03-22 DIAGNOSIS — I69354 Hemiplegia and hemiparesis following cerebral infarction affecting left non-dominant side: Secondary | ICD-10-CM

## 2023-03-22 DIAGNOSIS — R278 Other lack of coordination: Secondary | ICD-10-CM

## 2023-03-22 DIAGNOSIS — R41840 Attention and concentration deficit: Secondary | ICD-10-CM

## 2023-03-22 DIAGNOSIS — M6281 Muscle weakness (generalized): Secondary | ICD-10-CM

## 2023-03-22 DIAGNOSIS — R41841 Cognitive communication deficit: Secondary | ICD-10-CM | POA: Diagnosis not present

## 2023-03-22 DIAGNOSIS — R4701 Aphasia: Secondary | ICD-10-CM

## 2023-03-22 NOTE — Therapy (Addendum)
OUTPATIENT OCCUPATIONAL THERAPY NEURO TREATMENT NOTE & Discharge  Patient Name: Shawn York MRN: [id number] DOB:1975-11-14, 47 y.o., male Today's Date: 03/22/2023  PCP: Etta Grandchild, MD REFERRING PROVIDER: Milinda Antis, PA-C  END OF SESSION:  OT End of Session - 03/22/23 1202     Visit Number 15    Number of Visits 25    Date for OT Re-Evaluation 03/24/23    Authorization Type [employer]    OT Start Time 1158   arrival from SLP   OT Stop Time 1234    OT Time Calculation (min) 36 min    Activity Tolerance No increased pain;Patient tolerated treatment well;Patient limited by fatigue;Patient limited by pain    Behavior During Therapy [facility] for tasks assessed/performed                 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   Depression    Epistaxis    Headache    Hypertension    PTSD (post-traumatic stress disorder)    Seizures (HCC)    Past Surgical History:  Procedure Laterality Date   BASAL CELL CARCINOMA EXCISION  1995   BRAIN SURGERY  12/29/2022   CRANIOTOMY FOR TUMOR Right 02/25/2011   IR ANGIO EXTERNAL CAROTID SEL EXT CAROTID BILAT MOD SED  11/16/2021   IR ANGIO INTRA EXTRACRAN SEL COM CAROTID INNOMINATE UNI R MOD SED  11/16/2021   IR ANGIO INTRA EXTRACRAN SEL INTERNAL CAROTID UNI L MOD SED  11/16/2021   IR ANGIO VERTEBRAL SEL VERTEBRAL UNI R MOD SED  11/16/2021   IR ANGIOGRAM FOLLOW UP STUDY  11/17/2021   IR NEURO EACH ADD'L AFTER BASIC UNI LEFT (MS)  11/17/2021   IR NEURO EACH ADD'L AFTER BASIC UNI RIGHT (MS)  11/17/2021   IR TRANSCATH/EMBOLIZ  11/16/2021   IR US GUIDE VASC ACCESS RIGHT  11/16/2021   RADIOLOGY WITH ANESTHESIA N/A 11/16/2021   Procedure: IR WITH ANESTHESIA;  Surgeon: Baldemar Lenis, MD;  Location: [facility] OR;  Service: Radiology;  Laterality: N/A;   TOTAL HIP ARTHROPLASTY Right 12/21/2021   Procedure: RIGHT TOTAL HIP  REPLACEMENT;  Surgeon: Tarry Kos, MD;  Location: [facility] OR;  Service: Orthopedics;  Laterality: Right;  3-C   WISDOM TOOTH EXTRACTION     Patient Active Problem List   Diagnosis Date Noted   Left hemiparesis (HCC) 02/19/2023   Hemisensory deficit 02/19/2023   Shoulder-hand syndrome 02/19/2023   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 02/08/2023   Depression with anxiety 01/11/2023   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   Coagulation defect, unspecified (HCC) 11/02/2022   Amnestic MCI (mild cognitive impairment with memory loss) 11/02/2022   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) 11/02/2022   Abnormal finding on MRI of brain 05/30/2022   Aneurysm of ascending aorta without rupture (HCC) 02/28/2022   Anemia due to acquired thiamine deficiency 12/01/2021   Seasonal allergic rhinitis due to pollen 11/08/2021   Family history of dissecting aortic aneurysm 11/08/2021   Contrast media allergy 11/08/2021   Avascular necrosis of bone of right hip (HCC) 11/02/2021   Avascular necrosis of bone of left hip (HCC) 11/02/2021   Primary hypertension 05/17/2021   Encounter for general adult medical examination with abnormal findings 05/17/2021   PTSD (post-traumatic stress disorder) 06/05/2018   Nightmares 09/27/2017   Brain tumor, glioma (HCC) 09/27/2017  Severe episode of recurrent major depressive disorder, without psychotic features (HCC)    Major depressive disorder, recurrent episode (HCC) 08/17/2016   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    ONSET DATE: 12/29/22 DOS craniotomy and tumor resection   REFERRING DIAG: C71.2 (ICD-10-CM) - Anaplastic oligodendroglioma of temporal lobe (HCC) I63.112 (ICD-10-CM) - 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  THERAPY DIAG:  Hemiplegia and hemiparesis following cerebral infarction affecting left non-dominant side (HCC)  Other lack  of coordination  Muscle weakness (generalized)  Other disturbances of skin sensation  Attention and concentration deficit  Rationale for Evaluation and Treatment: Rehabilitation  PERTINENT HISTORY: 47 year old male with history of AVN of b-hips s/p R-THR, HTN, PTSD, depression w/anxiety, ascending aortic aneurysm, 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 Adline Peals with 70% removal of tumor. Metronomic Temodar initiated. He completed standard radiation with concurrent temozolomide and Avastin in 2013. MRI on 11/09/2022 suspicious for progression and repeat MRI done 4 weeks later found to have FLAIR lesion. He underwent repeat right temporal craniotomy for resection of tumor on 12/29/2022 by Dr. Lavonna Monarch at [facility]. This was complicated by bleeding from a branch of the MCA and post-op motor deficits of left side and left facial droop. Follow up MRI showed subjacent extra axial hemorrhage with layering blood products in right temporal lobe resection cavity with small volume SAH and trace cytotoxic edema in resection cavity.  "This (point to L) arm is a big challenge.  I will tell it to do something and it won't." Reports decreased sensation, limited FMC, limited ability to grade pressure.  Spouse reports that when attempting to complete bimanual tasks, LUE may start task with setup but will not complete task as RUE.  PRECAUTIONS: no lifting or strenuous exercise until cleared by MD - no valsalva   WEIGHT BEARING RESTRICTIONS: No   SUBJECTIVE:   SUBJECTIVE STATEMENT: Pt reports waiting on a zoom call for an appt with oncology physician, however they did not show up.  Pt reports having a small victory yesterday, but unable to recall what it was.   PAIN:  Are you having pain? None at rest today    FALLS: Has patient fallen in last 6 months? 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, break down pallets, Conservation officer, nature  PATIENT GOALS: to be able to play guitar again, not drop things   OBJECTIVE: (All objective assessments below are from initial evaluation on: 01/26/23 unless otherwise specified.)   HAND DOMINANCE: Right  ADLs: Transfers/ambulation related to ADLs: Mod I without AD Eating: would need assist to cut meat, knocks items off table with L hand (use of 2 hands to stabilize and scoop) Grooming: utilizing LUE as gross assist to hold items but not utilizing during task UB Dressing: increased effort LB Dressing: increased effort, attempting to utilize LUE however it will frequently drop things. Difficulty with tying shoes/draw string and buttoning  Toileting: increased time/effort when tearing toilet paper Bathing: difficulty with use of LUE when washing R side of body Tub Shower transfers: Mod I stepping over tub ledge Equipment: Transfer tub bench  IADLs: Light housekeeping: attempted to feed dogs and dropped bowl from L hand Meal Prep: not currently cooking Community mobility: not cleared to drive Medication management: pt has a system and is able to remember to take as prescribed, difficulty with opening pill bottles  FUNCTIONAL OUTCOME MEASURES: Upper Extremity Functional Scale (UEFS): 18/80 or 22% functional (rated sole use of Lt arm) 03/22/23: 58/80 or 70% functional  UPPER EXTREMITY ROM:  RUE: WFL; LUE: Has active movement - can reach overhead and internal rotation with increased time/effort - difficulty sustaining, and increased compensations noted with fatigue   UPPER EXTREMITY MMT:   grossly 4+ to 5/5 bilaterally  HAND  FUNCTION: 03/08/23: Lt hand 44#   02/01/23: Grip: Rt: 125#, Lt: 53#  COORDINATION: Box and Blocks:  Right 42 blocks, Left 12 blocks 02/14/23: L: 20 blocks 03/13/23: L: 26 blocks  03/15/23: 9 hole peg test: L: 2:41 (utilizing sliding of pegs to edge of tray and then sliding up vs picking up from center of tray) 03/22/23: 9 hole peg test: L: 1:56.53 sec  SENSATION: 03/08/23: SWMFT Lt hand: Was able to perceive the 6.65 in median and ulnar nerves,, but was only 70-18% accurate for which finger was being touched within the nerve distribution. This shows progress!    02/01/23: OT tries Semmes-Weinstein Testing and he cannot perceive 6.65 (largest) most of the time globally in Lt hand, and so had diminished or no protective sensation and was cautioned about that.   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 with confrontation testing  Patient has difficulty with following activities due to following visual impairments: bumping into walls and furniture on L side of body  PERCEPTION: Impaired: Inattention/neglect: does not attend to left side of body   TODAY'S TREATMENT:                                                 03/22/23 Engaged in lengthy conservation about next steps in regards to pt's medical comorbidities and multiple medical appointments ongoing and whether pt would benefit from a pause in therapies to allow pt and family to focus on pressing medical concerns before resuming therapy sessions.  Of note, pt with inconsistent carryover of  education and exercises to home with reports of engaging in questionable home improvement tasks which due to decreased sensation and awareness of L side of body pt could have been injured.  Pt not wanting to schedule more appointments today, encouraged again (discussed on Monday) for pt to discuss with wife and call back to schedule as desired.   review goals  re-cert vs d/c UEFS: 58/80 or 70% functional per pt report.  Pt demonstrating decreased awareness of impairments of LUE as pertaining to ability to lift or carry items in L hand, use of tools, and safety concerns with driving.  Pt has decent strength and ROM in LUE, however due to decreased sensation and awareness pt with high risk of injury or dropping/breaking items. 9 hole peg test: 1:56. Pt is demonstrating  improved gross and fine motor coordination with improvements in motor control and ability to pick up small pegs this session.    03/20/23 Coordination: engaged in creating various pinch patterns with light resistance digiflex.  Pt frequently pressing pad down with pad of fingers, encouraged pt to attempt finger tip for increased precision to carry over to playing guitar.  Small finger and ring finger sliding off when pressed as well.   Complex planning: discussed meal prep with pt reporting that he would do the shopping and the cooking, but that his wife would make the menu/meal plan and add items to the grocery app.  Pt reports that she would include amounts if needing multiple of something.  Therefore transitioned to planning/organization of day.  Completed complex planning/problem solving prompt requiring pt to organize items into systematic schedule to complete all in time allotted.  Discussed use of online ordering, curb side pickup, and use of digital resources to assist as needed. Pt requiring increased time to setup task, utilizing 30 min increments when organizing.   03/15/23 Coordination: engaged in placing various sized pegs into  peg board progressing from larger to smaller pegs.  Pt demonstrating difficulty with smaller pegs due to difficulty grading pressure once picking up small pegs.  OT placed towel under pegs to provide increased traction when picking up.  Pt demonstrating improved ease with traction and demonstrating improved ability to grade pressure. 9 hole peg: Pt was able to complete in 2:41, still with difficulty when attempting to pick up small pegs, therefore frequently sliding towards edge of container and scooping over ledge into hand.   NMR: engaged in picking up large jacks and placing into cup on higher surface to facilitate motor control and grading pressure.  OT reiterating importance of visually attending to LUE during fine and gross motor tasks to compensate for decrease sensation. Sensation: OT reiterated engagement in massage, use of various textures, and use of vibration to facilitate return of sensation in L hand as pt has shown slight improvements and more improvements in forearm.    03/13/23 Hand Gripper: with LUE on level 35# with black spring. Pt picked up 1 inch blocks with gripper with 6/15 drops and min difficulty with sustained grasp to place into container. Completed 2nd time with same resistance with cues to increase visual attention to hand and task, pt dropping 3/15 during second set.  Increased to level 50# with yellow spring.  Pt dropping 4/15 with cues to visually attend to LUE to facilitate increased attention to task and sustained grasp due to visual inattention to L.  Engaged in discussion to take extra time to ensure proper setup to increase stablity with holding items.   Box and blocks: L: 26 blocks Coordination: engaged in stacking and unstacking blocks with LUE to address coordination, ability to grade pressure with grasp/release.  Pt demonstrating improved motor control and grading pressure with stacking and unstacking only dropping x1. Tool use: engaged in picking up balls with  tongs to place onto vertical cones with focus on grading pressure with grasp/release and motor control with placing balls onto cones.  Pt demonstrating decreased sustained attention to LUE especially when removing balls from cones, frequently releasing grasp on ball during transitional movement. OT encouraged pt to pick up, place, and carry styrofoam cup with slits cut into it to further facilitate attention to grasp pressure when picking up and/or carrying items.     PATIENT EDUCATION: Education details: ongoing condition specific education.  Educated on sensation and  proprioceptive input. Person educated: Patient Education method: Explanation, Demonstration, and Handouts Education comprehension: verbalized understanding and needs further education  HOME EXERCISE PROGRAM: Developmental Motor Sequence (Rood) Do each 2-3 x day, don't hold breath, slowly, 5-10x each Crunches with arms and legs (Flexor-withdrawl) Roll over (toward unaffected side)  "Superman" extension exercises (Pivot prone)       4. Push up/down and Rock side/side on elbows       5. Push up to get to hands and knees   Access Code: [email or web address] URL: [email or web address] Date: 02/03/2023 Prepared by: [facility] - Outpatient  Rehab - [facility]  Access Code: [email or web address] URL: [email or web address] Date: 02/17/2023 Prepared by: [facility] - Outpatient  Rehab - [facility]   GOALS: Goals reviewed with patient? Yes  SHORT TERM GOALS: Target date: 02/24/23  Pt will be independent in HEP for LUE coordination and ROM. Baseline: Goal status: MET - 02/23/23  2.  Pt will verbalize understanding of task modifications and/or potential AE needs to increase ease, safety, and independence w/ ADLs. Baseline:  Goal status: MET - 02/23/23  3.  Patient will demonstrate improved awareness of visual management strategies for L inattention. Baseline:  Goal status: IN PROGRESS  4.  Pt will  demonstrate improved UE functional use for ADLs as evidenced by increasing box/ blocks score by 4 blocks with LUE Baseline: Box and Blocks:  Right 42 blocks, Left 12 blocks Goal status: MET - L: 20 blocks on 02/14/23   LONG TERM GOALS: Target date: 03/24/23  Pt will demonstrate improved UE functional use for ADLs as evidenced by increasing box/ blocks score by 8 blocks with LUE Baseline: Box and Blocks:  Right 42 blocks, Left 12 blocks Goal status: MET - L: 26 blocks on 03/13/23  2.  Pt will demonstrate improved fine motor coordination for ADLs as evidenced by being able to complete 9 hole peg test with LUE in 2 min time limit. Baseline:  Goal status: MET - 1:56.53 sec  3.  Pt will demonstrate improved awareness of safety considerations and AE needs to ensure safety d/t decreased sensation Baseline:  Goal status: NOT MET 03/22/23  4.  Pt will demonstrate improved attention to LUE to allow for sustained grasp to transport household items 25' to increase engagement in IADLs. Baseline:  Goal status: NOT MET 03/22/23  5.  Pt will demonstrate improved functional use of LUE as evidenced by improved score by TBD on UEFS. Baseline: 18/80 or 22% functional (rated sole use of Lt arm) Goal status: MET reports 70% on 03/22/23   ASSESSMENT:  CLINICAL IMPRESSION: Pt is making some progress with functional use of LUE, with improving fine and gross motor control as well as sustained grasp on items with L hand, however reports dropping toilet when carrying it from house to the curb due to decreased sustained strength and awareness of LUE.  Pt asking about return to work with both OT and SLP educating pt on areas of concern impeding return to work at this time.  Pt with decreased emergent awareness in regards to return to work, recommendations for assistance with home improvement tasks, and concerns with driving.    Pt would benefit from further OT services, however due to multiple medical comorbidities pt  may benefit from a pause in therapies to allow for focus on medical appointments and then resume therapies as medical appointments become less frequent.  Pt would also benefit from family member to attend a treatment session intermittently to facilitate increased carryover to the  home environment.  Pt currently did not schedule additional appointments and is to call to schedule.  If pt does not call in 30 days, will d/c from therapy and pt will then need a new referral.   PLAN:  OT FREQUENCY: 2x/week  OT DURATION: 8 weeks  PLANNED INTERVENTIONS: self care/ADL training, therapeutic exercise, therapeutic activity, neuromuscular re-education, manual therapy, passive range of motion, functional mobility training, splinting, electrical stimulation, ultrasound, compression bandaging, moist heat, cryotherapy, patient/family education, cognitive remediation/compensation, visual/perceptual remediation/compensation, psychosocial skills training, energy conservation, coping strategies training, and DME and/or AE instructions  CONSULTED AND AGREED WITH PLAN OF CARE: Patient and family member/caregiver  PLAN FOR NEXT SESSION:  Continue to challenge him in terms of left arm use decreasing inattention to left arm fine motor skills and gross motor coordination, periodically doing stretches and strengthening as tolerated and appropriate as well.     Rosalio Loud, OTR/L 03/22/2023, 1:18 PM   OCCUPATIONAL THERAPY DISCHARGE SUMMARY  Visits from Start of Care: 15  Current functional level related to goals / functional outcomes: Unsure as pt did not return after 03/22/23 appt, however pt had met 3/5 LTGs at that time. Plan was to take a break from therapies to allow for focus on medical appointments and then resume therapies as medical appointments become less frequent.  Also recommendation for family member to attend a treatment session intermittently to facilitate increased carryover to the home environment.  Pt  did not call to schedule additional appointments and therefore is to be discharged.   Remaining deficits: Safety awareness, decreased attention/awareness of L body and space   Education / Equipment: HEP with focus on shoulder and coordination, developmental motor sequence   Patient agrees to discharge. Patient goals were partially met. Patient is being discharged due to not returning since the last visit.Marland Kitchen  Rosalio Loud, OT 07/11/23

## 2023-03-22 NOTE — Patient Instructions (Signed)
Please discuss with spouse how to proceed with future therapy visits as things calm down with medical appointments and around the home.  Pt would benefit from further OT services, to continue to address motor control, sensation, and awareness of impairments, etc.  However pt is currently inundated with multiple medical appointments, therefore may benefit from a pause in therapies to allow for focus on medical appointments and then resume therapies when medical appointments slow down.

## 2023-03-22 NOTE — Therapy (Signed)
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   Depression    Epistaxis    Headache    Hypertension    PTSD (post-traumatic stress disorder)    Seizures (HCC)    Past Surgical History:  Procedure Laterality Date   BASAL CELL CARCINOMA EXCISION  1995   BRAIN SURGERY  12/29/2022   CRANIOTOMY FOR TUMOR Right 02/25/2011   IR ANGIO EXTERNAL CAROTID SEL EXT CAROTID BILAT MOD SED  11/16/2021   IR ANGIO INTRA EXTRACRAN SEL COM CAROTID INNOMINATE UNI R MOD SED  11/16/2021   IR ANGIO INTRA EXTRACRAN SEL INTERNAL CAROTID UNI L MOD SED  11/16/2021   IR ANGIO VERTEBRAL SEL VERTEBRAL UNI R MOD SED  11/16/2021   IR ANGIOGRAM FOLLOW UP STUDY  11/17/2021   IR NEURO EACH ADD'L AFTER BASIC UNI LEFT (MS)  11/17/2021   IR NEURO EACH ADD'L AFTER BASIC UNI RIGHT (MS)  11/17/2021   IR TRANSCATH/EMBOLIZ  11/16/2021   IR US GUIDE VASC ACCESS RIGHT  11/16/2021   RADIOLOGY WITH ANESTHESIA N/A 11/16/2021   Procedure: IR WITH ANESTHESIA;  Surgeon: Shawn Lenis, MD;  Location: [facility];  Service: Radiology;  Laterality: N/A;   TOTAL HIP ARTHROPLASTY Right 12/21/2021   Procedure: RIGHT TOTAL HIP REPLACEMENT;  Surgeon: Shawn Kos, MD;  Location: [facility];  Service: Orthopedics;  Laterality: Right;  3-C   WISDOM TOOTH EXTRACTION     Patient Active Problem  List   Diagnosis Date Noted   Left hemiparesis (HCC) 02/19/2023   Hemisensory deficit 02/19/2023   Shoulder-hand syndrome 02/19/2023   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 02/08/2023   Depression with anxiety 01/11/2023   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   Coagulation defect, unspecified (HCC) 11/02/2022   Amnestic MCI (mild cognitive impairment with memory loss) 11/02/2022   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) 11/02/2022   Abnormal finding on MRI of brain 05/30/2022   Aneurysm of ascending aorta without rupture (HCC) 02/28/2022   Anemia due to acquired thiamine deficiency 12/01/2021   Seasonal allergic rhinitis due to pollen 11/08/2021   Family history of dissecting aortic aneurysm 11/08/2021   Contrast media allergy 11/08/2021   Avascular necrosis of bone of right hip (HCC) 11/02/2021   Avascular necrosis of bone of left hip (HCC) 11/02/2021   Primary hypertension 05/17/2021   Encounter for general adult medical examination with abnormal findings 05/17/2021   PTSD (post-traumatic stress disorder) 06/05/2018   Nightmares 09/27/2017   Brain tumor, glioma (HCC) 09/27/2017   Severe episode of recurrent major depressive disorder, without psychotic features (HCC)    Major depressive disorder, recurrent episode (HCC) 08/17/2016   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    ONSET DATE: 12/29/22  REFERRING DIAG: C71.2 (ICD-10-CM) - Anaplastic oligodendroglioma of temporal lobe (HCC) I63.112 (ICD-10-CM) - 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 Adline [facility] with 70% removal of tumor. Metronomic Temodar initiated. He completed standard radiation with concurrent temozolomide and Avastin in 2013. MRI on 11/09/2022 suspicious for progression and repeat MRI done 4 weeks later found to have FLAIR lesion. He underwent repeat right temporal craniotomy for resection of tumor on 12/29/2022 by [facility] at [facility]. This was complicated by bleeding from a branch of the MCA and post-op motor deficits of left side and left facial droop. Follow up MRI showed subjacent extra axial hemorrhage with layering blood products in right temporal lobe resection cavity with small volume SAH and trace cytotoxic edema in resection cavity. Therapy evaluations completed with recommendations for CIR. Patient admitted 01/06/23. SLP consult today due to reports of cognitive changes and biting is tongue when eating.   Pt's wife sent a message to pt's therapists via MyChart on 01/25/23 which stated: Hello Dr. Sundra [facility], this is Shawn [facility]'s wife [facility] and I wanted to write to you prior to our upcoming visit tomorrow to inform you of the current situation. [name] is having a hard time following through with simple tasks and it's causing a lot of stress in the home. He does not want to hear how important it is for him to do his PT from me, but he is not doing the exercises like he should. His depression is very severe and is aiding in the current situation at home. I wanted you to know ahead of time so I don't have to bring this up in front of him at our appointment.   PAIN:  Are you having pain? No  FALLS: Has patient fallen in last 6 months?  No  PATIENT GOALS: Return to work  OBJECTIVE:   DIAGNOSTIC FINDINGS:  MR BRAIN WO CONTRAST December 12 2022 IMPRESSION: 1. Changes since 2020 MRI in the residual superior  Right temporal gyrus highly suspicious for Tumor Recurrence, including new mass-like T2/FLAIR hyperintense gyral expansion and small new multifocal nodular enhancement there. No intracranial mass effect 2. Also new since 2020 but chronic appearing lacunar infarcts in the right basal ganglia. 3. No other acute intracranial abnormality.   MR BRAIN WO CONTRAST  Jan 17 2023 IMPRESSION: 1. 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 Previously seen nodular foci of contrast enhancement in the right temporal lobe are no longer visualized. 2. New T2/FLAIR hyperintense signal abnormality in the right corona radiata with associated curvilinear contrast enhancement in these regions. This could potentially represent a region of subacute infarct. Recommend attention on follow-up. 3. Small volume subarachnoid hemorrhage and postoperative subdural hematoma along the right cerebral convexity, as above. 4. 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 5. Dural enhancement and thickening along the left occipital  convexity and likely the left temporal convexity, which may be due to CSF hypotension.  Discharge from SLP service:Jan 17, 2023 Patient has met 1 of 1 long term goals.  Patient to discharge at overall Modified Independent level.  Reasons goals not met: N/A  Clinical Impression/Discharge Summary: Patient has made functional gains and has met 1 of 1 LTGs this admission. Currently, patient requires extra processing time and is overall Mod I for recall of daily information with use of compensatory strategies. Patient continues to report impaired sensation of his oral-motor musculature resulting in him biting his lips and tongue intermittently. However, his strength appears intact  without evidence of dysarthria and with 100% intelligibility. Patient and family education is complete and patient will discharge home with assistance from family. Due to patient's high level of functioning and overall independence at baseline, f/u outpatient SLP services are recommended.  Care Partner:  Caregiver Able to Provide Assistance: Yes  Type of Caregiver Assistance: Cognitive;Physical Recommendation:  Outpatient SLP;24 hour supervision/assistance  Rationale for SLP Follow Up: Maximize cognitive function and independence;Reduce caregiver burden   PATIENT REPORTED OUTCOME MEASURES (PROM): Cognitive Function: returned 02/27/23 and pt scored 91/140, with higher scores indicating lesser impact of cognition on QOL.   TODAY'S TREATMENT:                                                                                                                                         DATE:   03/22/23: Pt tells SLP he is thinking about going back to work; SLP strongly suggested pt not return to work and cited that he has difficulty completing homework between ST sessions, and when he does homework does not complete it fully. Additionally, his emergent awareness is not effective at this time. Today, pt did not fully complete homework, and demonstrated decr'd reasoning with a portion of homework - writing out sentences (instead of saying sentences) with words of sounds he told SLP he struggles to say (/k/, /g/). SLP reminder note on the list of words was "make 2 sentences with each word". Reasoning and problem solving were targeted with pt's goal to get CBC labs prior to appointment with [facility] today due to pt's solution for solving this problem was illogical. SLP asked pt if mother drove him to appointment today and he answered affirmatively, however at end of OT session, OT told SLP pt got into his car and drove away. Pt's last appointment is scheduled for today. Pt was instructed to talk with wife to  discuss if he will schedule more rehab visits [facility] take a break until his medical appointments are less frequent.   03/15/23: "I did get my MyChart opened back up." Pt did not provide homework to SLP when arriving into ST room. SLP focused treatment today on attention and attention to detail with "organizing your day" task. Pt req'd initial mod cues to begin working on the  task. Mod cues necessary to self correct and find errors, which pt did when he double checked. SLP reviewed strategies for emergent awareness. Later in session SLP asked pt if he had homework and he said he did not but looked in his binder and found he had homework. SLP reiterated need to look in notebook every day for items he may have missed.  03/13/23: Pt intimated that [facility] might not be able to arrive for therapy appointments mainly because of work but also due to previous experiences with therapists.Pennie Rushing and SLP talked through what might be some ways to talk with Maryellen Pile feelings and Caydence stated he and wife have done this to a small degree.  He has not contacted MD about Ritalin dosage due to locking himself out of MyChart. SLP offered to assist pt in recovering his password but pt assured SLP he would do this at home before Wednesday's appointment but did not put in a reminder to do so.  Pt stated he did not have Temodar in his pillbox for last night so did not take his dose- Maggie put in doses for Thursday (start day) through the rest of the pill box days which ended Saturday. "She put the bottle somewhere I didn't know where it was." He did not want to call Maggie last night to ask where the pill bottle was for chemo due to her already being asleep. SLP helped pt understand he should have called wife and inquired about where med bottle was due to importance of that meducation. SLP also suggested pt should contact med onc about skipping the dose last night - pt chose to write a reminder to do this, and put it in his "homework"  section of his binder.  He decided to barter with making concrete pad - pad will get made next week and he will give car to the person constructing the pad.  Homework for functional organization.  03/08/23: Pt reports he completed HEP - errors made. SLP demonstrated how to check work more efficiently with highlighting and checking systematically.  Reviewed making list and getting items out BEFORE starting task to increase efficiency and reduce errors. Pt to complete following example for HEP.   Example:  -Paving additional parking lot space in driveway Pt reports anxiety/depression. SLP and pt discussed relationships at home. Pt reports sensory overload, as well as, re: decreased memory of conversations and decreased motivation to complete task lists impacting his relationship. He feels like he is letting his wife down. SLP suggested [facility] come in for communication strategies for help managing this change in dynamic.  Pt reports he will talk to [facility]. Also encouraged pt to reach out to doc regarding medication re: ritalin, if he feels like it hasn't made much of an impact.    03/06/23: Pt's last page of homework not completed. Tells SLP he double checked his answers however the last two responses on the previous page are not completed either.  Pt tells SLP he forgot a step in replacement of bathroom vanity now requiring him to buy a wrench to remove valves on water lines. SLP worked with pt for a functional compensation and pt agreed, after SLP explanation, that writing out a checklist for remaining steps would be helpful and pt did so with good success. 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  Pt bought pill box after last session and filled on his own. He did  not have anyone double check his accuracy although he states he is chdcking them prior to taking them. SLP told pt to have wife double check his accuracy  each time he fills for the next 3-4 weeks, at least.   03/01/23: Pt brought 3-ring binder. Homework 3/5 completed. Pt told SLP he double checked homework but one response in the middle of the homework set was half completed. "I guess I didn't double check that one," Tsutomu stated. He has not obtained a pill box yet so SLP assisted pt in reasoning how he could ensure he would get one and fill it by next session. Pt put a reminder into his phone with min cue and extra time. SLP needed to provide cues for timing of alarm - pt had it set for a time when he may have already been home instead of shortly after rehab visits concluded. SLP asked therapeutic questions relating to anticipatory awareness with a home project and pt stated, "Wow, I'm starting to see how debilitating this is."    02/27/23:Pt acknowledged that the 3-ring binder is helping in keeping him better organized than the folder. Papers were hole-punched and put in the binder. SLP inquired about pt's session Thursday and Taaj told SLP some points using the visual reminder that SLP had written for him. SLP reviewed mental energy/energy conservation with pt. Pt has not obtained a pillbox, states he will do that this week. Pt stated [facility] also initiated a low dose of Ritalin. Please see [facility] note dated 02/22/23 in Care Everywhere for details on dosages. Today SLP assisted pt complete a simple but detailed functional task with simple executive function (time management of errands). Pt req'd min-mod cues for sequencing logically (left groceries in car for other errands), and for time to complete tasks. Homework for simple sequencing of written steps.  02/23/23: Pt was seen for skilled ST services targeting cognitive-communication. Provided edu on attention strategies re: managing fatigue to decrease cognitive load, managing distractions, brain breaks. SLP suggested pt get a pillbox to complete on Sundays for energy conservation as pt reports he takes his medicine  every day. SLP and pt decided this would reduce pill bottle opening and increase efficiency. Pt is not consistently using alarms as discussed last session - pt reports he will continue to attempt to use this strategy to assist with time management.    02/20/23: Pt indicating challenges with time management. SLP A pt in generating solutions for improved time management and task initiation using his phone as an external aid. Reminders and alarm apps demonstrated with features such as reoccurrence and location. Given example task, pt able to verbalize how he would use external cognitive aid to optimize in 4/4 trials. Led pt through convergent naming task, 75% accuracy, benefiting from cues to consider all details to form response. SLP facilitated generative language task with pt demonstrating need for visual reminders to aid in ability to complete accurately. With visual support and verbal cues to ongoing maintenance towards goal of completing 5, pt with 80% accuracy.   02/17/23: Very flat affect today. See "S" statement re: homework, phone, wallet. SLP asked for PROM which pt found in his therapy folder, not started. SLP told pt to complete for next session and asked him when that was - and pt discovered at this moment he forgot phone at home. Given "S" and pt leaving his phone and wallet at home, SLP took the initial part of the session to assist pt in thinking of a better  system to improve organization and planning. Pt unable to generate any feasible ideas so SLP suggested memory notebook/binder to organize his therapy information and achieve better organization in general (see pt instructions). SLP targeted organization and he separated all handouts with extra time and rare min A. He stuffed these back into his folder again and SLP req'd to provide cues for a more effective organizational strategy - for the time being, clip them together. Pt to hole punch handouts and put in 3-ring binder this weekend.  No  dysarthria observed in today's session. Consider providing lingual strength exercises as pt has complained of slurred speech and his tongue feeling "fat" in one previous session.  02/14/23: Pt brought homework folder today, however homework was unfinished as described in "s". Pt was unaware homework was unfinished.  He was 10 minutes late for initial session (OT), reported to SLP "My mom was pushing me out of the house, I was taking a birdbath." When asked about this pt demonstrated reduced anticipatory awareness and stated he "always" had difficulty with time management at work as well - started projects too late [facility] would take more time that he was allotted to complete.  Today SLP cued pt to look at task prior to completing it to familarize himself with the task proir to jumping in. In these detailed functional tasks pt completed with 100% accuracy x2, but third task with 60% success (3/5 correct). He did not double check answers, which was the initial "takeaway" SLP reiterated to pt about not completing his homework. When asked how he could improve his accuracy he told SLP he should have double checked his answers.  02/10/23: Completed HEP but forgot at home. Denied difficulty as primary SLP assisted with initial game plan for execution. Reduced attention noted in conversation with pt fixating on computer screen. Attention mildly improved following SLP commenting on reduced attention and minimizing visual distractions. Targeted attention to detail task with following specific instruction and decoding message. Pt completed tasks x2 with pt independently noting errors mid-task which skewed result. Pt able to correct with additional time and mod A. Targeted metacognitive skills, in which pt unable to ID solution to aid performance. SLP prompted marking letters to aid attention and processing, in which pt stated "but that is so slow". Re-educated importance of slow rate to aid processing speed and attention to  increase performance. Similar result occurred for second task. Pt verbally identified mistake but did not correct errors without prompt. Mild background noise (typing and voices in gym) was not impactful on performance. Pt reported tongue feels "fat" and reduced left facial sensation, which impacts intelligibility intermittently. Targeted velar sounds at word level for varied placements and increased number of syllables with rare errors noted. Benefited from slower rate for increased articulatory accuracy.   02/03/23: Pt did not return cognitive function PROM. SLP provided pt with targeted attention and organization tasks. Pt worked with detailed instrucutions and mathematical problems and did not double check his answers. Two errors were made which were not noticed by pt. SLP stressed to pt he will need to double check his answers now more than ever before. Pt said he would do so on remaining homework problems. SLP also told pt to download MyChart app to track appointments.   02/01/23: SLP completed the CLQT and discussed results with pt. Organization, planning, memory, and attention appear to be pt's deficit areas for SLP to target. Results are above.  01/26/23: SLP discussed some compensations for mealtime as pt stated  he is having difficulty feeling food on the lt labial margin during meals - setting a mirror up in front of him at meals was suggested.  Standardized eval results, as much as pt has completed, were also reviewed.  PATIENT EDUCATION: Education details: see "today's treatment" Person educated: Patient Education method: Explanation, Demonstration, and Verbal cues Education comprehension: verbalized understanding and needs further education   GOALS: Goals reviewed with patient? No  SHORT TERM GOALS: Target date: 03/03/23  Pt will demo alternating attention with min-mod complex tasks involving language with compensations 90%, in 3 sessions Baseline:02/27/23 Goal status: NOT MET  2.   Pt/family will report pt completes household tasks involving language for 30 consecutive minutes with compensations if necessary, in 3sessions Baseline:  Goal status: NOT MET  3.  Pt will demo use of cognitive (memory, planning/organization, attention, awareness) compensations when performing detailed language tasks in order to achieve 90% success, in 3 sessions Baseline:  Goal status: NOT MET  4.  Pt will demo compensations with POs for reported labial leakage due to facial numbness on lt (including upper/lower lips)  Baseline:  Goal Status: Deferred - Resolved   LONG TERM GOALS: Target date: 03/31/23  Pt will demo alternating attention with min-mod complex work-like tasks involving language (using interruptions) with compensations 100%, in 3 sessions Baseline:  Goal status: REVISED  2.  Pt/family will report pt completes household tasks involving language for 30 minutes with compensations if necessary, in 3 sessions Baseline:  Goal status: REVISED  3.  Pt will demo knowledge of need to employ compensations when performing detailed language tasks in order to achieve 100% success, in 3 sessions Baseline:  Goal status: REVISED  4.  Pt will tell SLP 3 compensations he may need to use to improve accuracy with recall and/[place] attention Baseline:  Goal status: REVISED  5.  Pt will score higher on cognitive PROM in the last 1-2 sessions than on initial administration  Baseline:  Goal Status: IN PROGRESS  6. Pt will perform HEP for motor speech with modified independence in 3 sessions  Baseline:  Goal status: INITIAL   ASSESSMENT:  CLINICAL IMPRESSION: He was provided oral motor list for velar consonants today (/k/, /g/), but wrote out his sentences instead of saying them. See "today's treatment" for more details of today's session. Patient is a 47 y.o. male who was seen today therapy targeting planning, organization, and attention. Pt's motor-speech will also cont to be targeted  intermittently in ST - SLP added LTG for HEP for motor speech. Pt's cognition as it stands currently would be detrimental to his job security and pt would benefit from skilled ST to improve these skills. Pt is driving to/from appointments - SLP would not recommend pt drive due to presentation, cognitively, in ST visits.   OBJECTIVE IMPAIRMENTS: include attention, memory, awareness, and executive functioning. These impairments are limiting patient from return to work, managing medications, managing appointments, managing finances, household responsibilities, ADLs/IADLs, and effectively communicating at home and in community. Factors affecting potential to achieve goals and functional outcome are cooperation/participation level (according to wife, Shawn York). Patient will benefit from skilled SLP services to address above impairments and improve overall function.  REHAB POTENTIAL: Good  PLAN:  SLP FREQUENCY: 2x/week  SLP DURATION: 8 weeks (York 17 sessions)  PLANNED INTERVENTIONS: Language facilitation, Environmental controls, Cueing hierachy, Cognitive reorganization, Internal/external aids, Functional tasks, SLP instruction and feedback, Compensatory strategies, and Patient/family education    [facility], CCC-SLP 03/22/2023, 5:05 PM

## 2023-03-23 ENCOUNTER — Ambulatory Visit (INDEPENDENT_AMBULATORY_CARE_PROVIDER_SITE_OTHER): Payer: BC Managed Care – PPO | Admitting: Psychiatry

## 2023-03-23 ENCOUNTER — Other Ambulatory Visit (HOSPITAL_COMMUNITY)
Admission: AD | Admit: 2023-03-23 | Discharge: 2023-03-23 | Disposition: A | Discharge status: Home / Self Care | Payer: BC Managed Care – PPO | Source: Ambulatory Visit | Attending: Physical Medicine and Rehabilitation | Admitting: Physical Medicine and Rehabilitation

## 2023-03-23 ENCOUNTER — Encounter: Payer: Self-pay | Admitting: Psychiatry

## 2023-03-23 DIAGNOSIS — F4310 Post-traumatic stress disorder, unspecified: Secondary | ICD-10-CM

## 2023-03-23 DIAGNOSIS — F515 Nightmare disorder: Secondary | ICD-10-CM

## 2023-03-23 DIAGNOSIS — F330 Major depressive disorder, recurrent, mild: Secondary | ICD-10-CM | POA: Diagnosis not present

## 2023-03-23 DIAGNOSIS — C719 Malignant neoplasm of brain, unspecified: Secondary | ICD-10-CM | POA: Insufficient documentation

## 2023-03-23 LAB — CBC WITH DIFFERENTIAL/PLATELET
Abs Immature Granulocytes: 0.01 10*3/uL (ref 0.00–0.07)
Basophils Absolute: 0 10*3/uL (ref 0.0–0.1)
Basophils Relative: 1 %
Eosinophils Absolute: 0.1 10*3/uL (ref 0.0–0.5)
Eosinophils Relative: 1 %
HCT: 42.2 % (ref 39.0–52.0)
Hemoglobin: 14.6 g/dL (ref 13.0–17.0)
Immature Granulocytes: 0 %
Lymphocytes Relative: 33 %
Lymphs Abs: 1.7 10*3/uL (ref 0.7–4.0)
MCH: 31.9 pg (ref 26.0–34.0)
MCHC: 34.6 g/dL (ref 30.0–36.0)
MCV: 92.1 fL (ref 80.0–100.0)
Monocytes Absolute: 0.5 10*3/uL (ref 0.1–1.0)
Monocytes Relative: 9 %
Neutro Abs: 2.8 10*3/uL (ref 1.7–7.7)
Neutrophils Relative %: 56 %
Platelets: 150 10*3/uL (ref 150–400)
RBC: 4.58 MIL/uL (ref 4.22–5.81)
RDW: 12.3 % (ref 11.5–15.5)
WBC: 5 10*3/uL (ref 4.0–10.5)
nRBC: 0 % (ref 0.0–0.2)

## 2023-03-23 MED ORDER — DOXAZOSIN MESYLATE 4 MG PO TABS
4.0000 mg | ORAL_TABLET | Freq: Every day | ORAL | 0 refills | Status: DC
Start: 2023-03-23 — End: 2023-06-23

## 2023-03-23 MED ORDER — BREXPIPRAZOLE 2 MG PO TABS
2.0000 mg | ORAL_TABLET | Freq: Every day | ORAL | 2 refills | Status: DC
Start: 2023-03-23 — End: 2023-06-23

## 2023-03-23 NOTE — Progress Notes (Signed)
Shawn York [id number] March 27, 1976 47 y.o.  Subjective:   Patient ID:  Shawn York is a 47 y.o. (DOB 03-24-1976) male.  Chief Complaint:  Chief Complaint  Patient presents with   Anxiety   Depression    Anxiety Patient reports no dizziness or nausea.    Depression        Past medical history includes anxiety.    Shawn York presents to the office today for follow-up of anxiety and depression. He reports that he was started on Ritalin by oncologist. He reports that Ritalin has helped "maybe a little bit" with concentration. He reports that he frequent "daydreams" and has some forgetfulness.  He reports that he is "adjusting to being only able to use one hand." He reports that he had difficulty putting on pants today. He reports that he has been repeatedly dropping items. He reports that physical issues cause frustration and sadness. He reports that depression- "isn't any better." He reports anxiety in response to how health issues have affected his relationship with wife. He reports that he will forget to do things wife has asked and does not recall some information they have discussed.  He reports, "I don't feel motivated but I have things I feel like I have to do, so I will get up and do things." He reports not feeling energized. Appetite has been ok. Sleeping ok. He reports that he had worsening nightmares immediately after CVA "because I couldn't move." He reports that he continues to have some occasional nightmares. 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 He reports that he has been enjoying this. Denies SI.   He reports that his mom has been driving him to appointments.   He recently completed oral chemotherapy and may be starting another form of chemotherapy. He has an appointment with a psychologist with [facility]. He has been working with OT.   Past Psychiatric Medication  Trials: Trintellix Rexulti Trileptal Buspar Remeron Zoloft- Sexual side effects. "Took the edge off a little bit."  Prozac- Sexual side effects Viibryd- Severe GI side effects Hydroxyzine- Drowsiness Dayvigo-excessive somnolence Doxazosin-helpful for nightmares Hydroxyzine- had withdrawal  AIMS    Flowsheet Row Office Visit from 02/22/2023 in [facility] Office Visit from 12/12/2022 in [facility]  AIMS Total Score 0 0      PHQ2-9    Flowsheet Row Office Visit from 02/15/2023 in [facility] Office Visit from 02/28/2022 in [facility] [place] Office Visit from 10/12/2021 in [facility] [place] Office Visit from 03/18/2021 in [facility] Office Visit from 02/18/2021 in [facility]  PHQ-2 Total Score 2 2 2 2 3   PHQ-9 Total Score 9 11 4 5 12       Flowsheet Row Admission (Discharged) from 01/06/2023 in [facility] A Pre-Admission Testing 60 from 12/14/2021 in [facility] PREADMISSION TESTING ED from 11/21/2021 in [facility] at [facility]  C-SSRS RISK CATEGORY No Risk No Risk No Risk        Review of Systems:  Review of Systems  Gastrointestinal:  Negative for nausea.  Musculoskeletal:  Negative for gait problem.  Neurological:  Negative for dizziness and weakness.  Psychiatric/Behavioral:  Positive for depression.        Please refer  to HPI    Medications: I have reviewed the patient's current medications.  Current Outpatient Medications  Medication Sig Dispense Refill   b complex vitamins capsule Take 1 capsule by mouth daily.     busPIRone (BUSPAR) 30 MG tablet Take 1 tablet (30 mg total) by mouth 2 (two) times daily. 180 tablet 1   methylphenidate (RITALIN) 5 MG  tablet Take 5 mg by mouth 2 (two) times daily with breakfast and lunch.     nebivolol (BYSTOLIC) 5 MG tablet Take 5 mg by mouth daily.     Oxcarbazepine (TRILEPTAL) 300 MG tablet Take 1 tablet (300 mg total) by mouth 2 (two) times daily. 60 tablet 0   pregabalin (LYRICA) 100 MG capsule Take 1 capsule (100 mg total) by mouth 3 (three) times daily. 90 capsule 5   traZODone (DESYREL) 50 MG tablet Take 1 tablet (50 mg total) by mouth at bedtime. 30 tablet 5   acetaminophen (TYLENOL) 500 MG tablet Take 2 tablets (1,000 mg total) by mouth every 8 (eight) hours as needed. (Patient not taking: Reported on 03/23/2023) 30 tablet 0   brexpiprazole (REXULTI) 2 MG TABS tablet Take 1 tablet (2 mg total) by mouth daily. 30 tablet 2   doxazosin (CARDURA) 4 MG tablet Take 1 tablet (4 mg total) by mouth at bedtime. 90 tablet 0   EPINEPHrine 0.3 mg/0.3 mL IJ SOAJ injection Inject 0.3 mg into the muscle once as needed (for a severe, allergic reaction).      levocetirizine (XYZAL) 5 MG tablet TAKE 1 TABLET BY MOUTH EVERY DAY IN THE EVENING 90 tablet 1   No current facility-administered medications for this visit.    Medication Side Effects: Other: Stomach upset when taking meds on an empty stomach  Allergies:  Allergies  Allergen Reactions   Iodinated Contrast Media Rash and Anaphylaxis   Penicillins Shortness Of Breath and Rash    Did it involve swelling of the face/tongue/throat, SOB, or low BP? Yes Did it involve sudden or severe rash/hives, skin peeling, or any reaction on the inside of your mouth or nose? No Did you need to seek medical attention at a hospital or doctor's office? No When did it last happen? Unk If all above answers are "NO", may proceed with cephalosporin use.     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   Please see review of systems for further details on the patient's review from today.   Objective:   Physical Exam:  BP 113/71   Pulse (!) 54   Physical Exam Constitutional:      General: He is not in acute distress. Musculoskeletal:        General: No deformity.  Neurological:     Mental Status: He is alert and oriented to person, place, and time.     Coordination: Coordination normal.  Psychiatric:        Attention and Perception: Attention and perception normal. He does not perceive auditory or visual hallucinations.  Mood and Affect: Affect is not labile, blunt, angry or inappropriate.        Speech: Speech normal.        Behavior: Behavior normal.        Thought Content: Thought content normal. Thought content is not paranoid or delusional. Thought content does not include homicidal or suicidal ideation. Thought content does not include homicidal or suicidal plan.        Cognition and Memory: Cognition and memory normal.        Judgment: Judgment normal.     Comments: Insight intact Mood presents as less depressed and less anxious compared to last exam     Lab Review:     Component Value Date/Time   NA 140 01/17/2023 0558   NA 141 06/21/2013 1505   K 4.0 01/17/2023 0558   K 4.2 06/21/2013 1505   CL 108 01/17/2023 0558   CO2 25 01/17/2023 0558   CO2 25 06/21/2013 1505   GLUCOSE 101 (H) 01/17/2023 0558   GLUCOSE 83 06/21/2013 1505   BUN 22 (H) 01/17/2023 0558   BUN 18.1 06/21/2013 1505   CREATININE 0.91 01/17/2023 0558   CREATININE 1.03 12/15/2021 1522   CREATININE 0.9 06/21/2013 1505   CALCIUM 8.6 (L) 01/17/2023 0558   CALCIUM 9.1 06/21/2013 1505   PROT 5.8 (L) 01/16/2023 0526   PROT 7.0  06/21/2013 1505   ALBUMIN 3.4 (L) 01/16/2023 0526   ALBUMIN 4.1 06/21/2013 1505   AST 22 01/16/2023 0526   AST 15 12/15/2021 1522   AST 15 06/21/2013 1505   ALT 40 01/16/2023 0526   ALT 19 12/15/2021 1522   ALT 13 06/21/2013 1505   ALKPHOS 44 01/16/2023 0526   ALKPHOS 59 06/21/2013 1505   BILITOT 0.1 (L) 01/16/2023 0526   BILITOT 0.3 12/15/2021 1522   BILITOT 0.33 06/21/2013 1505   GFRNONAA >60 01/17/2023 0558   GFRNONAA >60 12/15/2021 1522   GFRAA >60 04/30/2020 1533       Component Value Date/Time   WBC 4.1 01/16/2023 0526   RBC 3.44 (L) 01/16/2023 0526   HGB 11.7 (L) 01/16/2023 0526   HGB 11.6 (L) 12/15/2021 1522   HGB 13.3 06/21/2013 1505   HCT 34.0 (L) 01/16/2023 0526   HCT 38.7 06/21/2013 1505   PLT 159 01/16/2023 0526   PLT 145 (L) 12/15/2021 1522   PLT 154 06/21/2013 1505   MCV 98.8 01/16/2023 0526   MCV 99.0 (H) 06/21/2013 1505   MCH 34.0 01/16/2023 0526   MCHC 34.4 01/16/2023 0526   RDW 14.0 01/16/2023 0526   RDW 13.3 06/21/2013 1505   LYMPHSABS 2.0 01/16/2023 0526   LYMPHSABS 1.1 06/21/2013 1505   MONOABS 0.4 01/16/2023 0526   MONOABS 0.5 06/21/2013 1505   EOSABS 0.1 01/16/2023 0526   EOSABS 0.1 06/21/2013 1505   BASOSABS 0.0 01/16/2023 0526   BASOSABS 0.0 06/21/2013 1505    No results found for: "POCLITH", "LITHIUM"   No results found for: "PHENYTOIN", "PHENOBARB", "VALPROATE", "CBMZ"   .res Assessment: Plan:    Pt reports that Ritalin 5 mg twice daily was started by oncology providers. He reports that he may have noticed some slight improvement in concentration since start of Ritalin, and otherwise denies any significant changes. He denies any worsening anxiety, irritability, appetite, or sleep since starting Ritalin. Pt reports that he has upcoming follow-up with oncology providers. Encouraged pt to discuss response to Ritalin with oncology provider. Discussed that Ritalin could be managed by this provider if needed.  Will continue Rexulti 2 mg  daily for depression since mood has been less depressed overall since starting Rexulti.  Continue Rexulti 2 mg daily for depression.  Continue Doxazosin 4 mg po at bedtime for nightmares.  Continue Buspar 30 mg twice daily for anxiety.  Continue Trazodone 50 mg at bedtime for insomnia.  Pt to follow-up in 3 months or sooner if clinically indicated.  Patient advised to contact office with any questions, adverse effects, or acute worsening in signs and symptoms.   Eesa was seen today for anxiety and depression.  Diagnoses and all orders for this visit:  MDD (major depressive disorder), recurrent episode, mild (HCC) -     brexpiprazole (REXULTI) 2 MG TABS tablet; Take 1 tablet (2 mg total) by mouth daily.  Nightmares -     doxazosin (CARDURA) 4 MG tablet; Take 1 tablet (4 mg total) by mouth at bedtime.  PTSD (post-traumatic stress disorder) -     doxazosin (CARDURA) 4 MG tablet; Take 1 tablet (4 mg total) by mouth at bedtime.     Please see After Visit Summary for patient specific instructions.  Future Appointments  Date Time Provider Department Center  03/30/2023  1:00 PM Etta Grandchild, MD [facility] None  05/29/2023 10:00 AM Angelina Sheriff, DO [facility] CPR  06/21/2023  1:30 PM Dohmeier, Porfirio Mylar, MD [facility] None  06/23/2023 10:30 AM Corie Chiquito, PMHNP [facility] None    No orders of the defined types were placed in this encounter.   -------------------------------

## 2023-03-27 DIAGNOSIS — C712 Malignant neoplasm of temporal lobe: Secondary | ICD-10-CM | POA: Diagnosis not present

## 2023-03-30 ENCOUNTER — Ambulatory Visit (INDEPENDENT_AMBULATORY_CARE_PROVIDER_SITE_OTHER): Payer: BC Managed Care – PPO | Admitting: Internal Medicine

## 2023-03-30 VITALS — BP 100/62 | HR 65 | Temp 98.0°F | Ht 74.0 in | Wt 210.0 lb

## 2023-03-30 DIAGNOSIS — R3589 Other polyuria: Secondary | ICD-10-CM | POA: Insufficient documentation

## 2023-03-30 DIAGNOSIS — B952 Enterococcus as the cause of diseases classified elsewhere: Secondary | ICD-10-CM | POA: Diagnosis not present

## 2023-03-30 DIAGNOSIS — N390 Urinary tract infection, site not specified: Secondary | ICD-10-CM

## 2023-03-30 DIAGNOSIS — I950 Idiopathic hypotension: Secondary | ICD-10-CM | POA: Insufficient documentation

## 2023-03-30 LAB — BASIC METABOLIC PANEL
BUN: 9 mg/dL (ref 6–23)
CO2: 28 mEq/L (ref 19–32)
Calcium: 9.7 mg/dL (ref 8.4–10.5)
Chloride: 103 mEq/L (ref 96–112)
Creatinine, Ser: 0.97 mg/dL (ref 0.40–1.50)
GFR: 93.42 mL/min (ref >60.00)
Glucose, Bld: 85 mg/dL (ref 70–99)
Potassium: 4.1 mEq/L (ref 3.5–5.1)
Sodium: 138 mEq/L (ref 135–145)

## 2023-03-30 LAB — CORTISOL: Cortisol, Plasma: 7.8 ug/dL

## 2023-03-30 LAB — TSH: TSH: 1.08 u[IU]/mL (ref 0.35–5.50)

## 2023-03-30 NOTE — Progress Notes (Signed)
Subjective:  Patient ID: Shawn York, male    DOB: 10-02-1975  Age: 47 y.o. MRN: [id number]  CC: Follow-up   HPI Shawn York presents for f/up ----  Discussed the use of AI scribe software for clinical note transcription with the patient, who gave verbal consent to proceed.  History of Present Illness   The patient, with a history of recent brain surgery, presents with persistent left-sided weakness and numbness, particularly affecting the hand. He reports difficulty with fine motor tasks, such as picking up small objects, due to an inability to gauge grip strength. Initially, post-surgery, the weakness was generalized to the entire left side, but the leg strength returned promptly.  In addition to the neurological symptoms, the patient has been experiencing urinary incontinence for approximately a month. He reports a sudden, uncontrollable urge to urinate, often resulting in accidents before reaching a restroom. There is no associated dysuria, hematuria, or abdominal pain. The patient denies any recent back or neck pain that could potentially be linked to these symptoms.  The patient also reports persistent dizziness and cognitive difficulties since the surgery, which have significantly impacted his daily activities and work. He expresses feelings of frustration and uselessness due to these limitations. Despite these challenges, the patient denies any suicidal ideation.       Outpatient Medications Prior to Visit  Medication Sig Dispense Refill   acetaminophen (TYLENOL) 500 MG tablet Take 2 tablets (1,000 mg total) by mouth every 8 (eight) hours as needed. 30 tablet 0   b complex vitamins capsule Take 1 capsule by mouth daily.     brexpiprazole (REXULTI) 2 MG TABS tablet Take 1 tablet (2 mg total) by mouth daily. 30 tablet 2   busPIRone (BUSPAR) 30 MG tablet Take 1 tablet (30 mg total) by mouth 2 (two) times daily. 180 tablet 1   doxazosin (CARDURA) 4 MG tablet Take 1 tablet (4 mg total)  by mouth at bedtime. 90 tablet 0   EPINEPHrine 0.3 mg/0.3 mL IJ SOAJ injection Inject 0.3 mg into the muscle once as needed (for a severe, allergic reaction).      levocetirizine (XYZAL) 5 MG tablet TAKE 1 TABLET BY MOUTH EVERY DAY IN THE EVENING 90 tablet 1   Oxcarbazepine (TRILEPTAL) 300 MG tablet Take 1 tablet (300 mg total) by mouth 2 (two) times daily. 60 tablet 0   pregabalin (LYRICA) 100 MG capsule Take 1 capsule (100 mg total) by mouth 3 (three) times daily. 90 capsule 5   traZODone (DESYREL) 50 MG tablet Take 1 tablet (50 mg total) by mouth at bedtime. 30 tablet 5   methylphenidate (RITALIN) 5 MG tablet Take 5 mg by mouth 2 (two) times daily with breakfast and lunch.     No facility-administered medications prior to visit.    ROS Review of Systems  Constitutional: Negative.  Negative for chills, diaphoresis and fatigue.  HENT: Negative.    Respiratory: Negative.  Negative for cough, chest tightness, shortness of breath and wheezing.   Cardiovascular:  Negative for chest pain, palpitations and leg swelling.  Gastrointestinal:  Negative for abdominal pain, diarrhea, nausea and vomiting.  Endocrine: Positive for polydipsia and polyuria. Negative for polyphagia.  Genitourinary:  Positive for frequency. Negative for hematuria.  Musculoskeletal: Negative.   Skin: Negative.   Neurological:  Positive for dizziness, weakness, light-headedness and numbness. Negative for seizures, syncope and speech difficulty.  Hematological:  Negative for adenopathy. Does not bruise/bleed easily.  Psychiatric/Behavioral: Negative.      Objective:  BP  100/62 (BP Location: Left Arm, Patient Position: Sitting, Cuff Size: Large)   Pulse 65   Temp 98 F (36.7 C) (Oral)   Ht 6\' 2"  (1.88 m)   Wt 210 lb (95.3 kg)   SpO2 99%   BMI 26.96 kg/m   BP Readings from Last 3 Encounters:  03/30/23 100/62  02/20/23 111/78  02/15/23 118/82    Wt Readings from Last 3 Encounters:  03/30/23 210 lb (95.3 kg)   02/20/23 218 lb (98.9 kg)  02/15/23 215 lb 6.4 oz (97.7 kg)    Physical Exam Vitals reviewed.  Eyes:     General: No scleral icterus.    Conjunctiva/sclera: Conjunctivae normal.  Cardiovascular:     Rate and Rhythm: Normal rate and regular rhythm.     Heart sounds: No murmur heard. Pulmonary:     Effort: Pulmonary effort is normal.     Breath sounds: No stridor. No wheezing, rhonchi or rales.  Abdominal:     General: Abdomen is flat.     Palpations: There is no mass.     Tenderness: There is no abdominal tenderness. There is no guarding.     Hernia: No hernia is present.  Musculoskeletal:        General: Normal range of motion.     Cervical back: Neck supple.     Right lower leg: No edema.     Left lower leg: No edema.  Lymphadenopathy:     Cervical: No cervical adenopathy.  Skin:    General: Skin is warm and dry.     Coloration: Skin is not pale.     Findings: No rash.  Neurological:     Mental Status: He is alert. Mental status is at baseline.  Psychiatric:        Attention and Perception: He is inattentive.        Mood and Affect: Mood is anxious and depressed. Affect is flat.        Speech: Speech normal. Speech is not slurred.        Behavior: Behavior normal.        Thought Content: Thought content normal. Thought content is not paranoid or delusional. Thought content does not include homicidal or suicidal ideation.        Cognition and Memory: Cognition normal.     Lab Results  Component Value Date   WBC 5.0 03/23/2023   HGB 14.6 03/23/2023   HCT 42.2 03/23/2023   PLT 150 03/23/2023   GLUCOSE 85 03/30/2023   CHOL 213 (H) 05/17/2021   TRIG 271.0 (H) 05/17/2021   HDL 53.60 05/17/2021   LDLDIRECT 127.0 05/17/2021   ALT 40 01/16/2023   AST 22 01/16/2023   NA 138 03/30/2023   K 4.1 03/30/2023   CL 103 03/30/2023   CREATININE 0.97 03/30/2023   BUN 9 03/30/2023   CO2 28 03/30/2023   TSH 1.08 03/30/2023   INR 1.0 12/15/2021    No results  found.  Assessment & Plan:   Idiopathic hypotension- Blood work is normal. -     Basic metabolic panel; Future -     Cortisol; Future -     TSH; Future -     Urinalysis, Routine w reflex microscopic; Future -     CULTURE, URINE COMPREHENSIVE; Future -     Sodium, urine, random; Future  Polyuria - Labs are negative for causes/complications. -     Basic metabolic panel; Future -     Cortisol; Future -  TSH; Future -     Urinalysis, Routine w reflex microscopic; Future -     CULTURE, URINE COMPREHENSIVE; Future -     Sodium, urine, random; Future  UTI (urinary tract infection) due to Enterococcus -     Nitrofurantoin Monohyd Macro; Take 1 capsule (100 mg total) by mouth 2 (two) times daily for 7 days.  Dispense: 14 capsule; Refill: 0     Follow-up: Return in about 3 months (around 06/30/2023).  Sanda Linger, MD

## 2023-03-31 ENCOUNTER — Encounter: Payer: Self-pay | Admitting: Internal Medicine

## 2023-04-01 DIAGNOSIS — B952 Enterococcus as the cause of diseases classified elsewhere: Secondary | ICD-10-CM | POA: Insufficient documentation

## 2023-04-01 DIAGNOSIS — N390 Urinary tract infection, site not specified: Secondary | ICD-10-CM | POA: Insufficient documentation

## 2023-04-01 MED ORDER — NITROFURANTOIN MONOHYD MACRO 100 MG PO CAPS
100.0000 mg | ORAL_CAPSULE | Freq: Two times a day (BID) | ORAL | 0 refills | Status: AC
Start: 2023-04-01 — End: 2023-04-08

## 2023-04-04 ENCOUNTER — Encounter: Payer: Self-pay | Admitting: Physical Medicine and Rehabilitation

## 2023-04-07 ENCOUNTER — Encounter: Payer: Self-pay | Admitting: Physical Medicine and Rehabilitation

## 2023-04-17 ENCOUNTER — Other Ambulatory Visit: Payer: Self-pay | Admitting: Physical Medicine and Rehabilitation

## 2023-04-17 DIAGNOSIS — C719 Malignant neoplasm of brain, unspecified: Secondary | ICD-10-CM

## 2023-04-20 ENCOUNTER — Other Ambulatory Visit: Payer: Self-pay | Admitting: Medical Genetics

## 2023-04-20 DIAGNOSIS — Z006 Encounter for examination for normal comparison and control in clinical research program: Secondary | ICD-10-CM

## 2023-04-24 ENCOUNTER — Other Ambulatory Visit: Payer: BC Managed Care – PPO

## 2023-04-24 ENCOUNTER — Ambulatory Visit
Admission: RE | Admit: 2023-04-24 | Discharge: 2023-04-24 | Disposition: A | Discharge status: Home / Self Care | Payer: BC Managed Care – PPO | Source: Ambulatory Visit | Attending: Neurology | Admitting: Neurology

## 2023-04-24 DIAGNOSIS — C712 Malignant neoplasm of temporal lobe: Secondary | ICD-10-CM | POA: Diagnosis not present

## 2023-04-24 DIAGNOSIS — Z9889 Other specified postprocedural states: Secondary | ICD-10-CM | POA: Diagnosis not present

## 2023-04-24 DIAGNOSIS — C719 Malignant neoplasm of brain, unspecified: Secondary | ICD-10-CM

## 2023-04-24 MED ORDER — GADOPICLENOL 0.5 MMOL/ML IV SOLN
10.0000 mL | Freq: Once | INTRAVENOUS | Status: AC | PRN
  Administered 2023-04-24: 10 mL via INTRAVENOUS

## 2023-04-25 IMAGING — CR DG CHEST 2V
2 series · 2 of 2 positions shown · non-contrast
Comparison: Chest x-ray dated April 30, 2020.

CLINICAL DATA: Chest pressure and shortness of breath.  Nosebleed.

EXAM:
CHEST - 2 VIEW

[w chest pa]
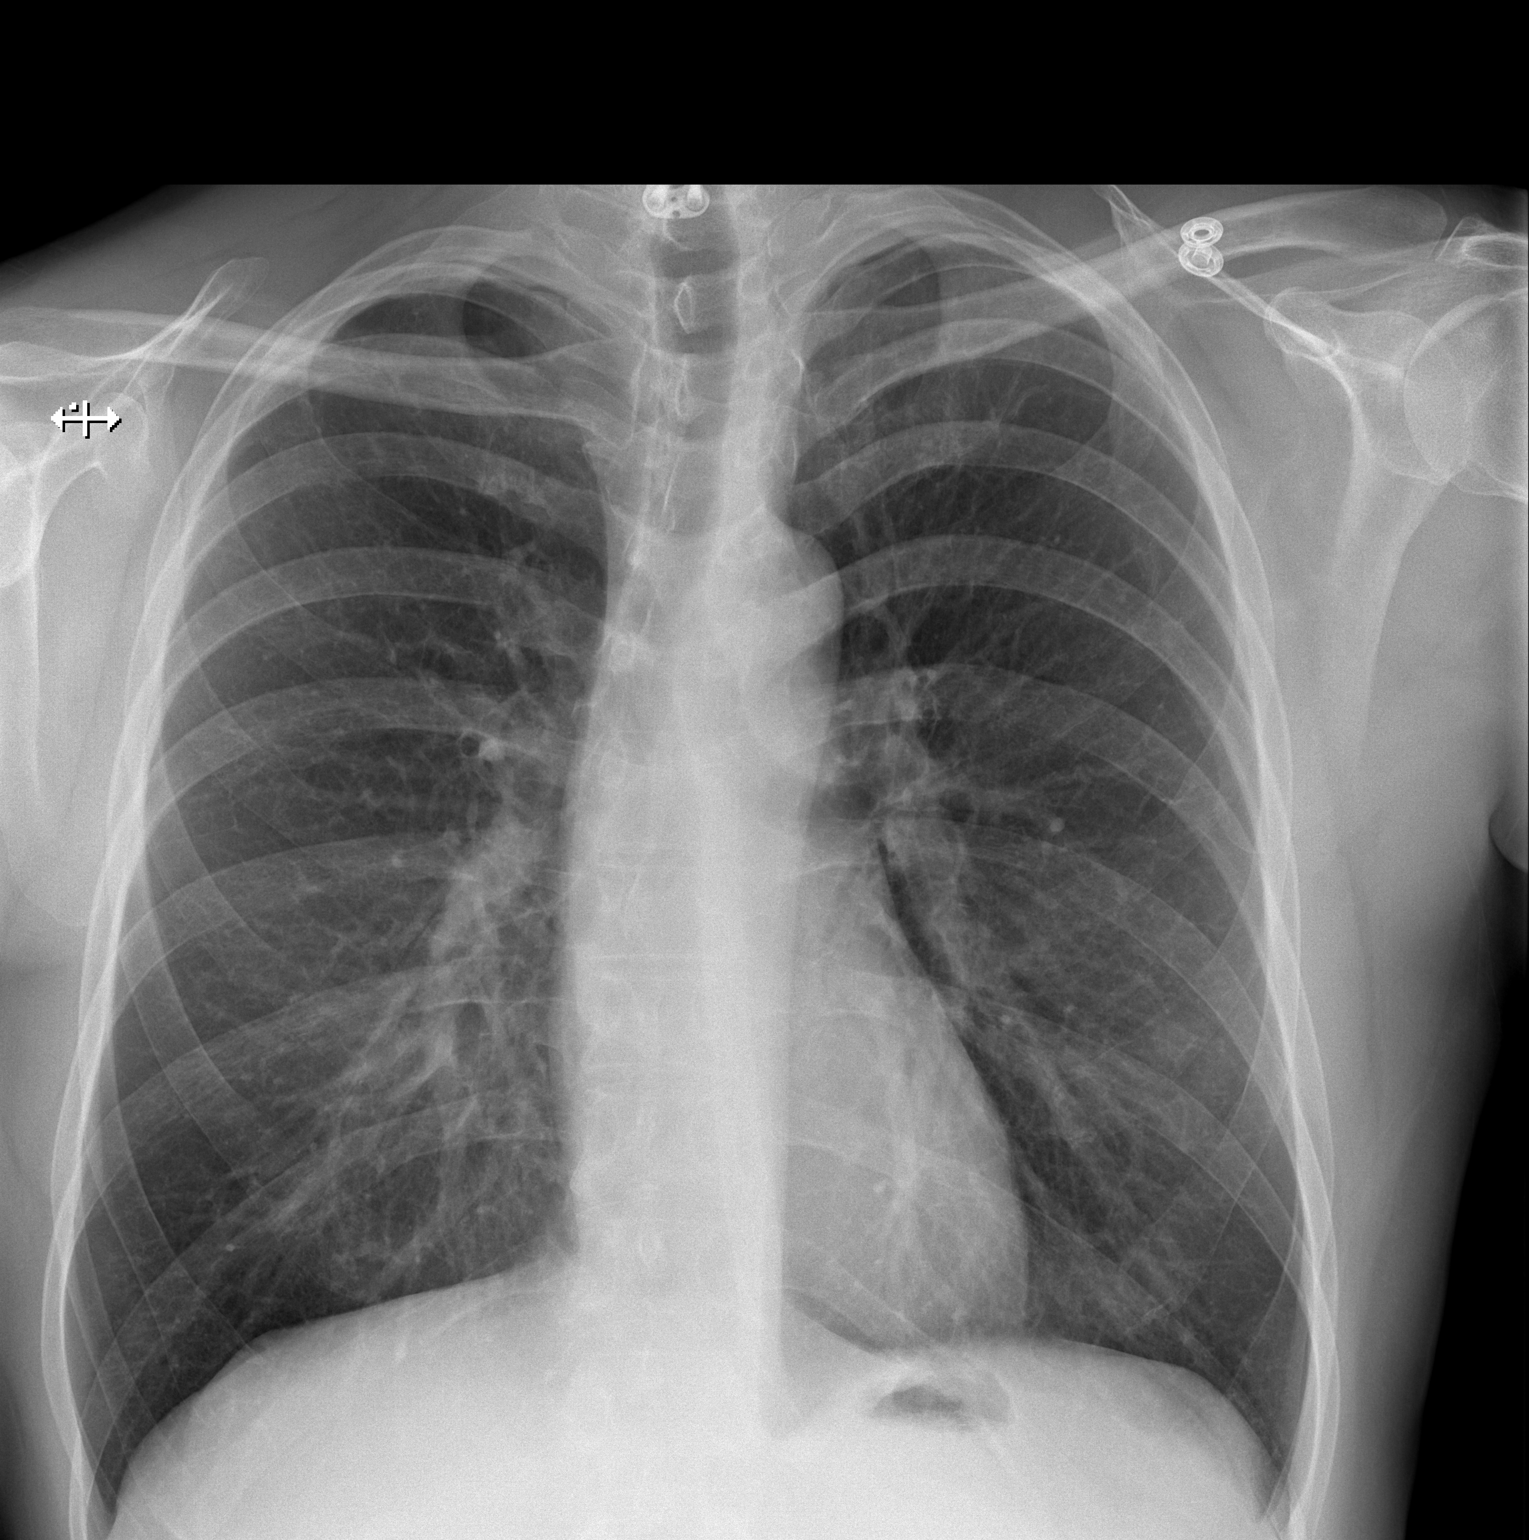

[w chest lat]
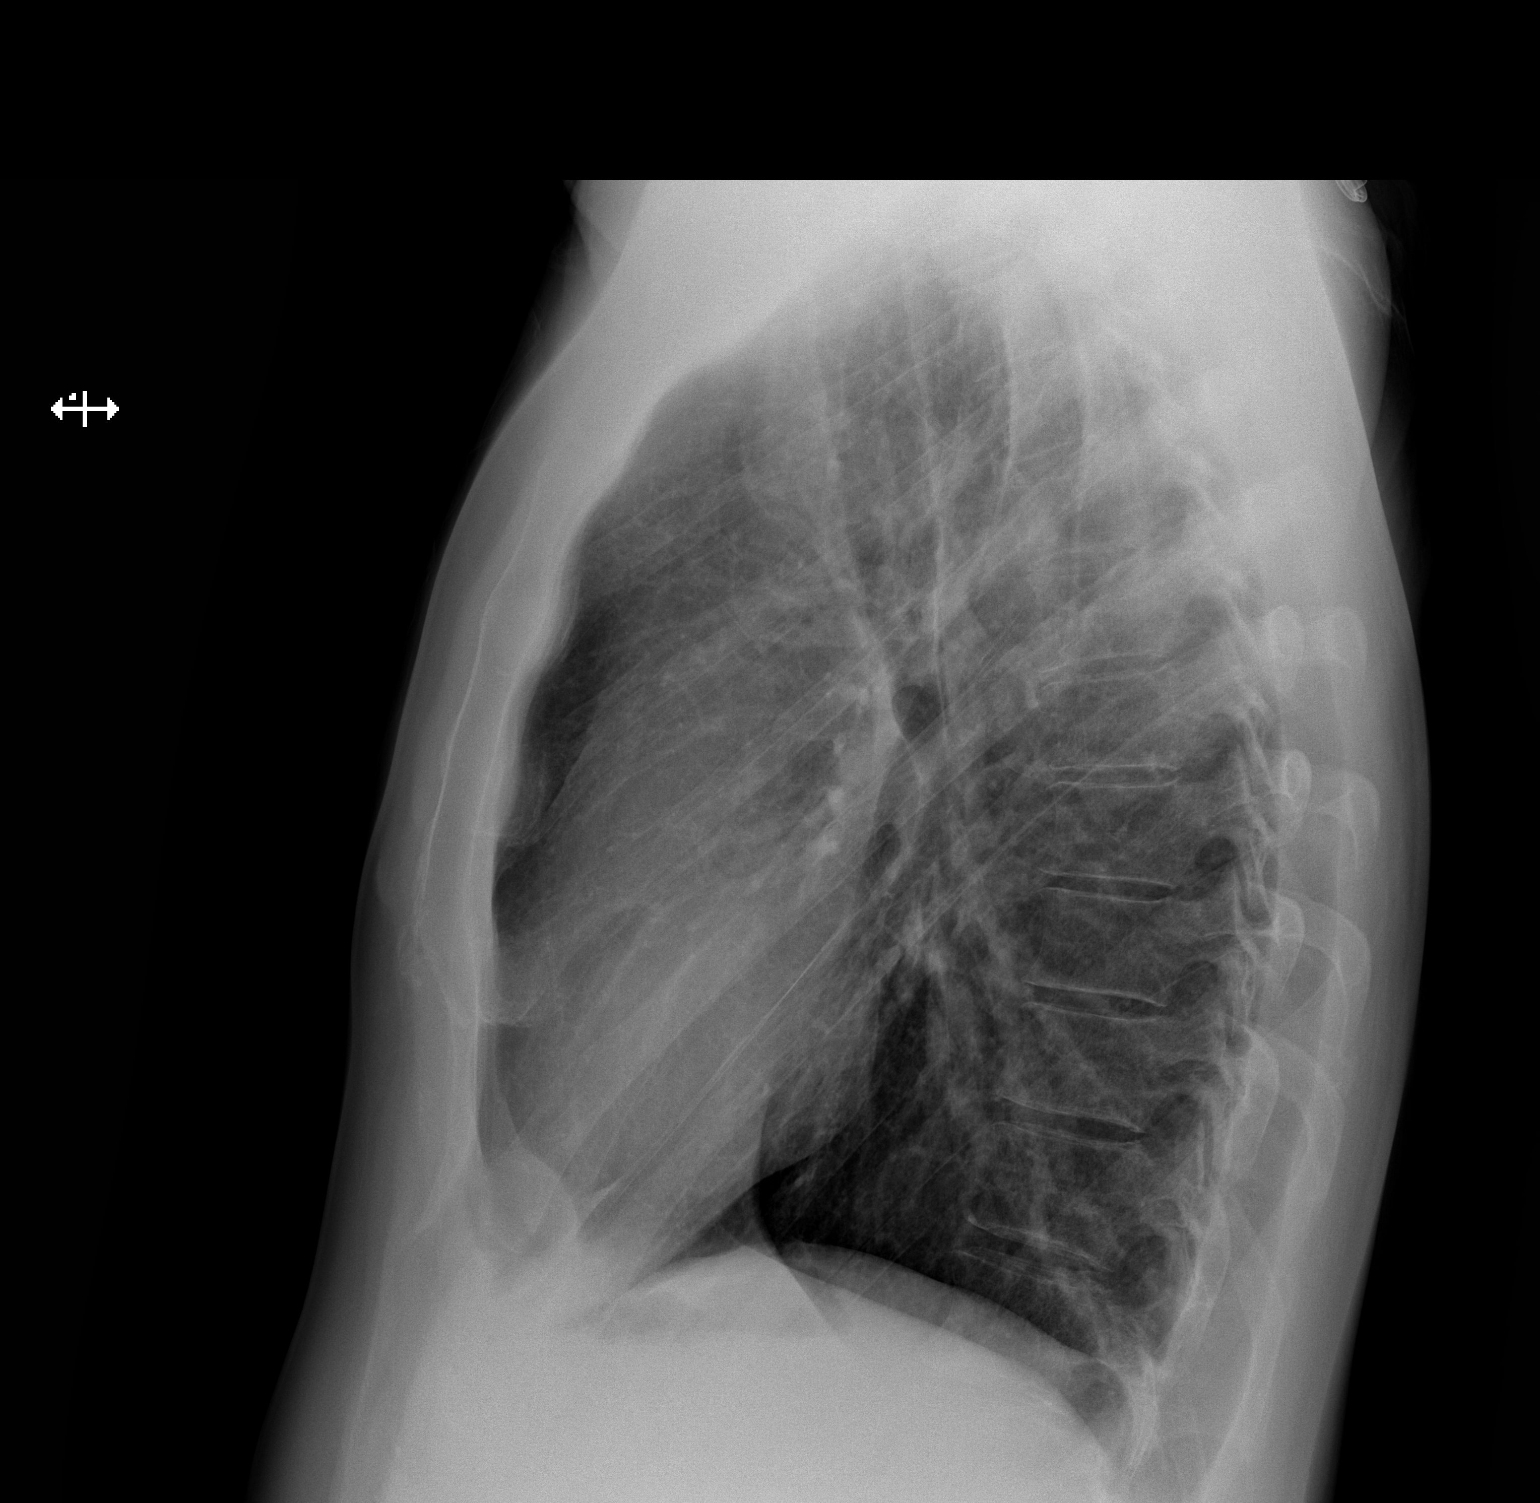

[2 of 2 positions shown; findings below may reference images not displayed]

FINDINGS: The heart size and mediastinal contours are within normal limits.
Both lungs are clear. The visualized skeletal structures are
unremarkable.
IMPRESSION: No active cardiopulmonary disease.

## 2023-04-26 DIAGNOSIS — C712 Malignant neoplasm of temporal lobe: Secondary | ICD-10-CM | POA: Diagnosis not present

## 2023-04-26 DIAGNOSIS — G40909 Epilepsy, unspecified, not intractable, without status epilepticus: Secondary | ICD-10-CM | POA: Diagnosis not present

## 2023-04-28 IMAGING — US IR TRANSCATH EMBOLIZATION
1 series · 3 of 3 positions shown · non-contrast
Comparison: None.

INDICATION: 45-year-old male with intermittent epistaxis for approximately 4
days, persistent despite nasal packing.

EXAM:
ULTRASOUND-GUIDED VASCULAR [REDACTED] CEREBRAL ANGIOGRAM
ENDOVASCULAR EPISTAXIS EMBOLIZATION
TECHNIQUE: Informed written consent was obtained from the patient after a
thorough discussion of the procedural risks, benefits and
alternatives. All questions were addressed.

[Series 1: ir rad eval and mgt. · 3 of 3 slices shown]
[im 1/3]
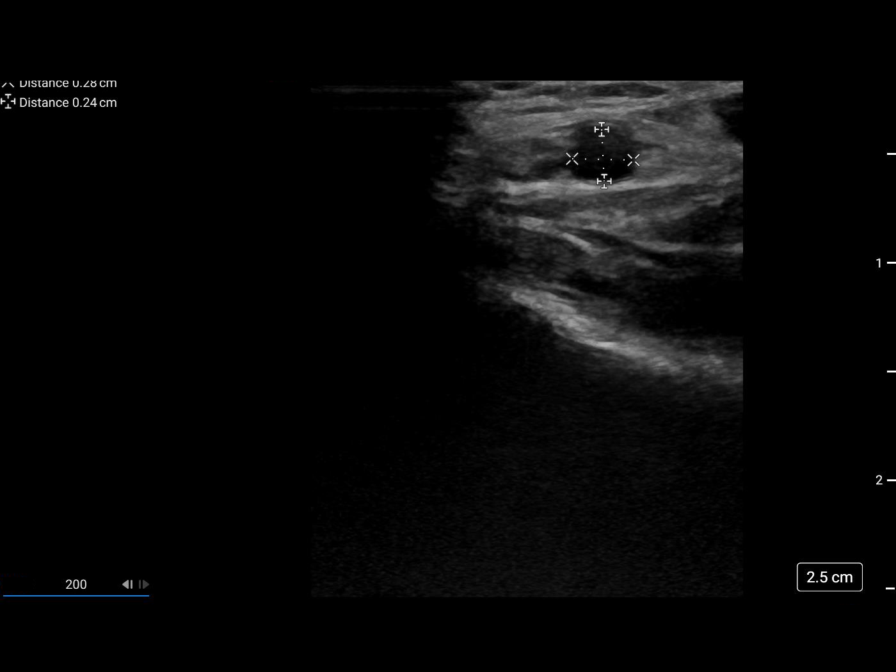
[im 2/3]
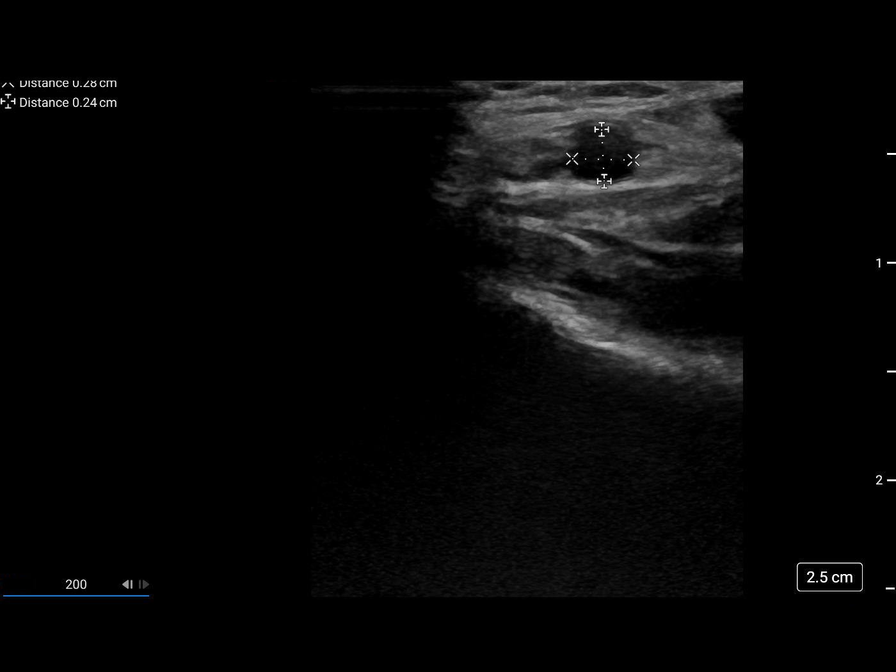
[im 3/3]
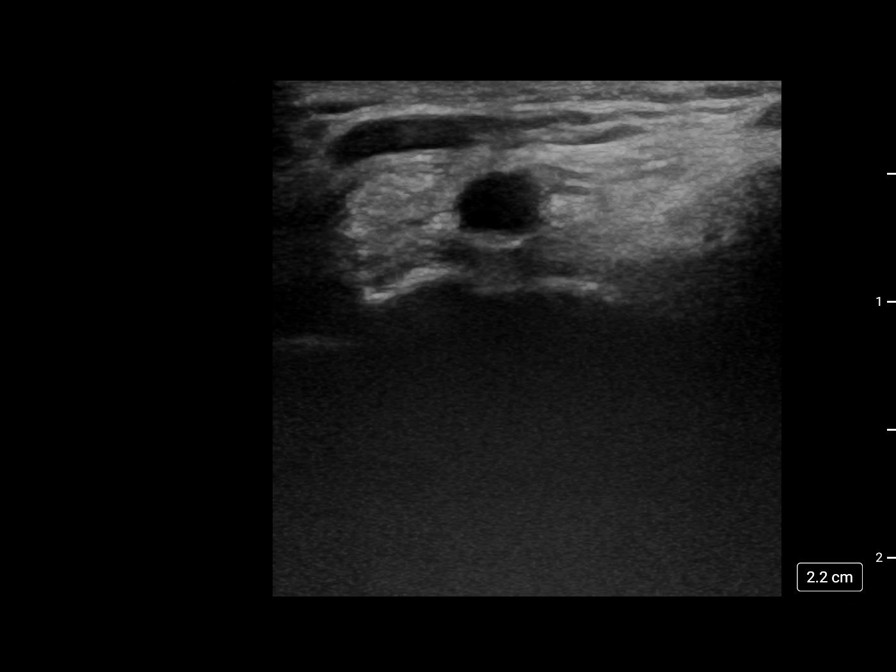

[3 of 3 positions shown; findings below may reference images not displayed]

MEDICATIONS:
Clindamycin 900 mg IV. The antibiotic was administered within 1 hour
of the procedure.

ANESTHESIA/SEDATION:
The procedure was performed in the general anesthesia.

CONTRAST:  107 mL of Omnipaque 300 milligram/mL.

FLUOROSCOPY:
Radiation Exposure Index (as provided by the fluoroscopic device):
1,090 mGy Kerma.

COMPLICATIONS:
None immediate.
Maximal Sterile Barrier Technique was utilized including caps, mask,
sterile gowns, sterile gloves, sterile drape, hand hygiene and skin
antiseptic. A timeout was performed prior to the initiation of the
procedure.

Using the modified Seldinger technique and a micropuncture kit,
access was gained to the distal right radial artery at the
anatomical snuffbox and a 6 French sheath was placed. Real-time
ultrasound guidance was utilized for vascular access including the
acquisition of a permanent ultrasound image documenting patency of
the accessed vessel. Slow intra arterial infusion of 300 mcg
nitroglycerin diluted in patient's own blood was performed. Then, a
right radial artery angiogram was obtained via sheath side port.
Normal brachial artery branching pattern seen. No significant
anatomical variation. The right radial artery caliber is adequate
for vascular access.

Next, a 5 Kevin Gz 2 glide catheter was navigated over a
0.035" Terumo Glidewire into the right subclavian artery under
fluoroscopic guidance. The catheter was then advanced into the
aortic arch where the catheter tip was reformed. Then, the catheter
was placed into the left common carotid artery. Frontal and lateral
angiograms of the neck were. Using biplane roadmap guidance, the
catheter was placed into the left internal carotid artery. Frontal
and lateral angiograms of the head and face were obtained. The
catheter was then placed into the left external carotid artery.
Frontal and lateral angiograms of the face/head were obtained. The
catheter was then exchanged over the wire and under fluoroscopic
guidance for a 6 French benchmark catheter which was placed into the
left external carotid artery. Frontal and lateral angiograms of the
head/face were obtained. Endovascular embolization of the left
sphenopalatine artery was then carried out, as described below.

The benchmark was exchanged over the wire and under fluoroscopy for
a 5 Kevin Gz 2 glide catheter which was navigated over a
0.035" Terumo Glidewire into the right subclavian artery. The
catheter tip was reformed in the aortic valve. The catheter was then
navigated into the right vertebral artery. Frontal and lateral
angiograms of the head and skull base were obtained. The catheter
was then placed into the right common carotid artery. Frontal and
lateral angiograms of the head were obtained followed by frontal and
angiograms of the neck. The catheter was then exchanged over the
wire and under fluoroscopic guidance for a 6 French benchmark
catheter which was placed into the right external carotid artery.
Frontal and lateral angiograms of the head/face were obtained.
Endovascular embolization of the right sphenopalatine artery was
then carried out, as described below.
FINDINGS: Right radial artery ultrasound and right radial artery angiogram:
The caliber of the distal right radial artery is appropriate for
angiogram access. The right radial artery and the right ulnar artery
have normal course and caliber. No significant anatomical variants
noted.

Left CCA angiograms: Cervical angiograms show normal course and
caliber of the visualized left common carotid and internal carotid
arteries. There are no significant stenoses.

Left ICA angiograms: There is brisk vascular contrast filling of the
left ACA and MCA vascular trees. Luminal caliber is smooth and
tapering. No aneurysms or abnormally high-flow, early draining veins
are seen. No regions of abnormal hypervascularity are noted. The
visualized dural sinuses are patent. Small contribution of the
ethmoidal branches of the ophthalmic artery to the nasal cavity is
seen.

Left ECA angiograms: Prominent arterial blush of the nasal septum,
inferior and lateral walls of the left nasal cavity are seen from
sphenopalatine artery. Minor contribution from the left facial
artery.

Right vertebral artery angiograms: The right vertebral artery,
basilar artery and visualized portions of the bilateral PCA are
normal in course and caliber. No aneurysms or abnormally high-flow,
early draining veins are seen. No regions of abnormal
hypervascularity are noted. The visualized dural sinuses are patent.

Right CCA angiograms: Cervical angiograms show normal course and
caliber of the visualized right common carotid and internal carotid
arteries. There are no significant stenoses.

Right CCA angiograms-cranial views: There is brisk vascular contrast
filling of the right ACA and MCA vascular trees. Luminal caliber is
smooth and tapering. No aneurysms or abnormally high-flow, early
draining veins are seen. No regions of abnormal hypervascularity are
noted. The visualized dural sinuses are patent. Contribution of the
ethmoidal branches of the ophthalmic artery to the nasal cavity is
seen.

Right ECA angiograms: Prominent arterial blush of the nasal septum,
inferior and lateral walls of the right nasal cavity and inferior
aspect of the left nasal cavity are seen from sphenopalatine artery.
Minor contribution from the right facial artery.

PROCEDURE:
Left sphenopalatine artery embolization:

Left external carotid artery angiogram were obtained with magnified
frontal and lateral views of the face were obtained. Using biplane
roadmap guidance, a Prowler select Plus microcatheter was navigated
over a synchro select microguidewire into the left sphenopalatine
artery. Frontal and lateral angiograms were obtained with
microcatheter contrast injection. Endovascular embolization was then
performed with 250-350 polyvinyl alcohol particles (PVA) in an
iodinated contrast suspension. No evidence of nontarget
embolization. The microcatheter was subsequently withdrawn. Left
external carotid artery angiograms showed adequate embolization of
the sphenopalatine artery with persistent supply of the nasal cavity
by a greater palatine artery.

Using biplane roadmap, attempted navigation of a new prowler select
plus microcatheter over a synchro select microguidewire into the
right greater palatine artery resulted in herniation of the guide
catheter into the aortic arch. The guide catheter was then exchanged
over the wire and under fluoroscopy for a 5 Kevin Gz 2 glide
catheter which was navigated over a 0.035" Terumo Glidewire into the
right subclavian artery. The catheter tip was reformed in the aortic
arch. The catheter was then navigated into the left common carotid
artery. The glide catheter was then exchanged over the wire and
under fluoroscopy for a benchmark catheter which was placed into the
left external carotid artery. Frontal and lateral angiograms of the
face were obtained.

Using biplane roadmap guidance, a headway duo microcatheter was
navigated over a synchro select microguidewire into the left greater
palatine artery. Frontal and lateral angiograms were obtained with
microcatheter contrast injection. Endovascular embolization was then
performed with 250-350 polyvinyl alcohol particles (PVA) in an
iodinated contrast suspension. No evidence of nontarget
embolization. The microcatheter was subsequently withdrawn. Left
external carotid artery angiograms showed adequate embolization of
the sphenopalatine artery and greater palatine arteries. Left
internal carotid artery angiogram showed no evidence of nontarget
embolization to the intracranial circulation.

Right sphenopalatine artery embolization:

Right external carotid artery angiogram were obtained with magnified
frontal and lateral views of the face were obtained. Using biplane
roadmap guidance, a Prowler select Plus microcatheter was navigated
over a synchro select microguidewire into the right sphenopalatine
artery. Frontal and lateral angiograms were obtained with
microcatheter contrast injection. Endovascular embolization was then
performed with 250-350 polyvinyl alcohol particles (PVA) in an
iodinated contrast suspension. No evidence of nontarget
embolization. The microcatheter was subsequently withdrawn. Right
external carotid artery angiograms showed adequate embolization of
the sphenopalatine artery. Right common carotid artery angiogram
showed no evidence of nontarget embolization to the intracranial
circulation. The guide catheter was subsequently withdrawn.

The guide catheter was subsequently withdrawn.

An inflatable band was placed and inflated over the right hand
access site. The vascular sheath was withdrawn and the band was
slowly deflated until brisk flow was noted through the arteriotomy
site. At this point, the band was reinflated with additional 2 cc of
air to obtain patent hemostasis.
IMPRESSION: Successful and uncomplicated bilateral interbody and artery
embolization for treatment of persistent recurrent epistaxis.

PLAN:
1. Post angiogram care with radial band deflation protocol.
2. Further management per ENT.

## 2023-05-05 ENCOUNTER — Other Ambulatory Visit: Payer: Self-pay | Admitting: Internal Medicine

## 2023-05-09 ENCOUNTER — Other Ambulatory Visit: Payer: Self-pay | Admitting: Internal Medicine

## 2023-05-09 ENCOUNTER — Encounter: Payer: Self-pay | Admitting: Internal Medicine

## 2023-05-09 DIAGNOSIS — J208 Acute bronchitis due to other specified organisms: Secondary | ICD-10-CM

## 2023-05-09 MED ORDER — NIRMATRELVIR/RITONAVIR (PAXLOVID)TABLET
3.0000 | ORAL_TABLET | Freq: Two times a day (BID) | ORAL | 0 refills | Status: AC
Start: 2023-05-09 — End: 2023-05-14

## 2023-05-09 MED ORDER — PROMETHAZINE-DM 6.25-15 MG/5ML PO SYRP
5.0000 mL | ORAL_SOLUTION | Freq: Four times a day (QID) | ORAL | 0 refills | Status: AC | PRN
Start: 2023-05-09 — End: 2023-05-16

## 2023-05-28 NOTE — Progress Notes (Unsigned)
Shawn York is a 47 y.o. year old male  who  has a past medical history of Anemia, Avascular necrosis (HCC), Brain cancer (HCC) (02/25/2011), Depression, Epistaxis, Headache, Hypertension, PTSD (post-traumatic stress disorder), and Seizures (HCC).    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 Lavonna Monarch at [facility], complicated by R MCA bleeding with L hemiparesis.  Plan from last visit:  Oligodendroglioma s/p resection (HCC) Left hemiparesis (HCC)   Today I refilled your Lyrica, Doxazosin, and Trazodone.   I will see you back in 3 months   L Hemisensory deficit Sensation recovered mostly in LLE, coming back in LUE; hopeful for continued improvement. 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   Resume Lyrica as above.    Continue therapies; I will reach out to SLP about lingual exercises to help with the sensation of slurred speech.   Discuss compression garments with OT for hypersensitivity with left arm.   Nightmares PTSD (post-traumatic stress disorder) -     Doxazosin Mesylate; Take 1 tablet (4 mg total) by mouth at bedtime.  Dispense: 90 tablet; Refill: 0   Further refills of Doxazosin should come from your PCP.   Interval Hx:  - Therapies: ***  - Follow ups: CBC sent 04/17/23  F/u with oncology 8/28:" ecent imaging shows subtle T2 Flair changes around the resection cavity, there is no areas of contrast enhancement. These findings raise concerns for early progression versus Temodar related Flair changes. Given he is clinically doing well, we will plan to continue Temozolomide and monitor closely with imaging. "  Following with psyuchiatry/counselling for MDD, has resources with Oncology as well, stable on medicaitons Rexulti 2 mg daily and Buspar 30 mg twice a day.    - Falls:***  - DME:***  - Medications: Lyrica last  filled 02/15/23. Now on methylphenidate 10 mg BID.    - Other concerns: Sent work letter 04/07/23 last visit with left arm lifting strength specifying <15 lbs with the left arm and <25 lbs with both; no restrictions on the right hand.   ***  PE: Constitution: Appropriate appearance for age. No apparent distress. Resp: No respiratory distress. No accessory muscle usage. on RA and CTAB Cardio:  Trace LUE peripheral edema, with erythema throughout, delayed capillary refill. RUE WNL.  Abdomen: Nondistended. Nontender.   Psych: Appropriate mood and affect. Neuro: AAOx4. No apparent cognitive deficits    Neurologic Exam:   Babinsky: flexor responses b/l.   Hoffmans: negative b/l Sensory exam: revealed normal sensation in all dermatomal regions in bilateral lower extremities and right upper extremity.  sensory deficit to mid-elbow and L V2-3   Motor exam: strength 5/5 throughout bilateral lower extremities, right upper extremity, and with exception of LUE strength 3/5 grip, FA; otherwise 4+/5 . No shoulder subluxation palpable.   Coordination: Fine motor coordination was mildly reduced on L compared to R.   Gait: normal  ***  Shawn York is a 47 y.o. year old male  who  has a past medical history of Anemia, Avascular necrosis (HCC), Brain cancer (HCC) (02/25/2011), Depression, Epistaxis, Headache, Hypertension, PTSD (post-traumatic stress disorder), and Seizures (HCC).   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 Lavonna Monarch at [facility], complicated by R MCA bleeding with L hemiparesis.  There are no diagnoses linked to this  encounter.

## 2023-05-29 ENCOUNTER — Encounter
Payer: BC Managed Care – PPO | Attending: Physical Medicine and Rehabilitation | Admitting: Physical Medicine and Rehabilitation

## 2023-05-29 VITALS — BP 122/85 | HR 59 | Ht 74.0 in | Wt 203.0 lb

## 2023-05-29 DIAGNOSIS — R4189 Other symptoms and signs involving cognitive functions and awareness: Secondary | ICD-10-CM | POA: Insufficient documentation

## 2023-05-29 DIAGNOSIS — R252 Cramp and spasm: Secondary | ICD-10-CM | POA: Insufficient documentation

## 2023-05-29 DIAGNOSIS — G8194 Hemiplegia, unspecified affecting left nondominant side: Secondary | ICD-10-CM | POA: Insufficient documentation

## 2023-05-29 DIAGNOSIS — C719 Malignant neoplasm of brain, unspecified: Secondary | ICD-10-CM | POA: Diagnosis not present

## 2023-05-29 MED ORDER — BACLOFEN 10 MG PO TABS: 10.0000 mg | ORAL_TABLET | Freq: Every evening | ORAL | 0 refills | Status: DC | PRN

## 2023-05-29 NOTE — Patient Instructions (Signed)
Return to neuropsychology Dr. Metro Kung at [facility], given that you have had evaluations with them in the past, for possible repeat neuropsych evaluation and recommendations.  Today, we talked about possible medications for your cognitive deficits.  Given that your primary issue is not related to fatigue or concentration, I agree that Ritalin is likely not very beneficial and we can try weaning off of this medication.  I would start with 5 mg twice a day for 2 weeks, then if you notice no changes you can stop this medication.  Instead of resuming Lyrica, we are going to try baclofen for your hand tightness.  Start with 10 mg at nighttime as needed.  After 2 weeks, message me through MyChart, and if this is helping we may increase it up to 2-3 times daily.  Follow-up with me in 4 months.

## 2023-06-14 DIAGNOSIS — C712 Malignant neoplasm of temporal lobe: Secondary | ICD-10-CM | POA: Diagnosis not present

## 2023-06-14 DIAGNOSIS — C719 Malignant neoplasm of brain, unspecified: Secondary | ICD-10-CM | POA: Diagnosis not present

## 2023-06-14 LAB — COMPREHENSIVE METABOLIC PANEL
Albumin: 4.3 (ref 3.5–5.0)
Calcium: 9.1 (ref 8.7–10.7)
eGFR: 106

## 2023-06-14 LAB — BASIC METABOLIC PANEL
BUN: 16 (ref 4–21)
CO2: 25 — AB (ref 13–22)
Chloride: 106 (ref 99–108)
Creatinine: 0.9 (ref 0.6–1.3)
Glucose: 96
Potassium: 3.9 meq/L (ref 3.5–5.1)
Sodium: 138 (ref 137–147)

## 2023-06-14 LAB — HEPATIC FUNCTION PANEL
ALT: 15 U/L (ref 10–40)
AST: 15 (ref 14–40)
Alkaline Phosphatase: 33 (ref 25–125)
Bilirubin, Total: 1.3

## 2023-06-21 ENCOUNTER — Ambulatory Visit (INDEPENDENT_AMBULATORY_CARE_PROVIDER_SITE_OTHER): Payer: BC Managed Care – PPO | Admitting: Neurology

## 2023-06-21 ENCOUNTER — Encounter: Payer: Self-pay | Admitting: Neurology

## 2023-06-21 VITALS — BP 116/84 | HR 92 | Ht 73.0 in | Wt 196.0 lb

## 2023-06-21 DIAGNOSIS — G4714 Hypersomnia due to medical condition: Secondary | ICD-10-CM | POA: Insufficient documentation

## 2023-06-21 DIAGNOSIS — G40209 Localization-related (focal) (partial) symptomatic epilepsy and epileptic syndromes with complex partial seizures, not intractable, without status epilepticus: Secondary | ICD-10-CM | POA: Insufficient documentation

## 2023-06-21 DIAGNOSIS — I63431 Cerebral infarction due to embolism of right posterior cerebral artery: Secondary | ICD-10-CM | POA: Insufficient documentation

## 2023-06-21 DIAGNOSIS — C712 Malignant neoplasm of temporal lobe: Secondary | ICD-10-CM

## 2023-06-21 DIAGNOSIS — I69014 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: Secondary | ICD-10-CM

## 2023-06-21 MED ORDER — AMPHETAMINE-DEXTROAMPHETAMINE 10 MG PO TABS: 10.0000 mg | ORAL_TABLET | Freq: Every day | ORAL | 0 refills | Status: DC

## 2023-06-21 NOTE — Progress Notes (Signed)
SLEEP MEDICINE CLINIC   Provider:  Melvyn Novas, MD  Primary Care Physician:  Etta Grandchild, MD [address] [place] [address]     Referring Provider: Etta Grandchild, Md [address] [place],  [place] [address]          Chief Complaint according to patient   Patient presents with:                HISTORY OF PRESENT ILLNESS:  Shawn York is a 47 y.o. male patient who is here for revisit 06/21/2023 for  monitoring of stroke recovery after brain surgery- he has a recurrent brain malignancy. Astrocytoma/oligodendroglioma, assess treatment response,  Malignant neoplasm of brain,  (CMS/HHS-HCC)  Chief concern according to patient :  " I have missed my kids school bus, I have forgotten - I have daily lapses of memory, no seizure activity noted"    [name] has mentioned the above issues with cognitive function.  He has forgotten to pick his kids up at the schoolbus or at least to do so in time.  He will set alarm clocks for himself but he does have a noticeable handicap in terms of short-term memory functions and timeframe - all affecting organizational executive function. He feels that his recovery from the perioperative stroke has been slowed, too. He reports his left hand to feel clumsy. His fingertips are numb, making use of touch screens difficult. He has been able to return to work but he feels his performance at work is questionable.  He is easily fatigued and would like to have regular hours , either always in AM or always in PM, but he needs routines.   He sleeps from 8 PM -6.30 AM and feels yet groggy in the morning, feels clumsy, stiff, and mentally slowed.  He sleeps deep and through the night.  He had nightmares in spring, but not now. Trazodone is helping.    He has had a new MRI brain at [facility], showing recurrent cancer tissue.    Referring Provider: This patient is referred via his Neuro -Oncologist at [facility]. I am not his stroke or oncology  specialist and have seen [facility] for sleep in the past. The recent development have made it  necessary to have a local generalist to follow, coordination of PT, OT , ST has been arranged by his [facility] neurologists. He already is scheduled for June 29th for another MRI and will meet with oncology at [facility].    HISTORY OF PRESENT ILLNESS:  Shawn York is a 47 y.o. male patient who is here for revisit 02/08/2023 for a post operative visit, the removal of recurrent brain tumor on 12-29-2022  has left him with left upper extremity weakness, less affected is the left leg and the left face is numb , but only mildy droopy.    SAH and 3 mm midline shift. There is 3 mm of leftward midline shift. Subarachnoid  hemorrhage within the right frontal lobe.    FINDINGS: Brain Parenchyma: Postoperative changes right pterional craniotomy with subjacent resection cavity. Expected postoperative pneumocephalus. Within the resection cavity there is small volume intraparenchymal hemorrhage, pneumocephalus. There is 3 mm of leftward midline shift. Subarachnoid hemorrhage within the right frontal lobe. Ventricles and Sulci: Normal for age.   Extra-Axial Spaces: Subdural hemorrhage overlying the right convexity measuring up to 8 mm (10:153).     Review of Systems: Out of a complete 14 system review, the patient complains of only  the following symptoms, and all other reviewed systems are negative.:  Fatigue, sleepiness , mild snoring,  fragmented sleep, 2 times nocturia.    I briefly reviewed the medication list the patient is on baclofen Lioresal which helps to control spasms in the hemiparetic side he is free to take Tylenol as needed he is on Rexulti by mouth BuSpar and this is twice a day, Cardura he has an EpiPen for allergic reaction as needed Xyzal Trileptal 300 mg by mouth 2 times daily Lyrica 100 mg by mouth 3 times daily trazodone 50 mg only which is a lower dose and he is reporting benefits from it.  He no longer has  used Ritalin after he tried 5 mg twice daily without much success. Social History   Socioeconomic History   Marital status: Married    Spouse name: Not on file   Number of children: Not on file   Years of education: Not on file   Highest education level: GED or equivalent  Occupational History   Not on file  Tobacco Use   Smoking status: Former    Current packs/day: 0.00    Types: Cigarettes    Quit date: 02/25/2011    Years since quitting: 12.3   Smokeless tobacco: Never  Vaping Use   Vaping status: Never Used  Substance and Sexual Activity   Alcohol use: Not Currently    Alcohol/week: 14.0 standard drinks of alcohol    Types: 14 Cans of beer per week    Comment: quit 11-08-2021   Drug use: No   Sexual activity: Yes    Comment: E-cigarette users  Other Topics Concern   Not on file  Social History Narrative   Not on file   Social Determinants of Health   Financial Resource Strain: Low Risk  (03/30/2023)   Overall Financial Resource Strain (CARDIA)    Difficulty of Paying Living Expenses: Not hard at all  Food Insecurity: No Food Insecurity (03/30/2023)   Hunger Vital Sign    Worried About Running Out of Food in the Last Year: Never true    Ran Out of Food in the Last Year: Never true  Transportation Needs: No Transportation Needs (03/30/2023)   PRAPARE - Administrator, Civil Service (Medical): No    Lack of Transportation (Non-Medical): No  Physical Activity: Unknown (03/30/2023)   Exercise Vital Sign    Days of Exercise per Week: 0 days    Minutes of Exercise per Session: Not on file  Stress: Stress Concern Present (03/30/2023)   [employer] of Occupational Health - Occupational Stress Questionnaire    Feeling of Stress : Rather much  Social Connections: Moderately Isolated (03/30/2023)   Social Connection and Isolation Panel [NHANES]    Frequency of Communication with Friends and Family: Once a week    Frequency of Social Gatherings with Friends and  Family: Twice a week    Attends Religious Services: Never    Database administrator or Organizations: No    Attends Engineer, structural: Not on file    Marital Status: Married    Family History  Problem Relation Age of Onset   Cancer Other        brain cancer   Cancer Father        skin cancer   Cancer Maternal Grandmother 76       brain cancer   Alcohol abuse Brother    Alzheimer's disease Paternal Grandmother    Dementia Paternal Grandmother  Heart disease Paternal Grandfather     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   Depression    Epistaxis    Headache    Hypertension    PTSD (post-traumatic stress disorder)    Seizures (HCC)     Past Surgical History:  Procedure Laterality Date   BASAL CELL CARCINOMA EXCISION  1995   BRAIN SURGERY  12/29/2022   CRANIOTOMY FOR TUMOR Right 02/25/2011   IR ANGIO EXTERNAL CAROTID SEL EXT CAROTID BILAT MOD SED  11/16/2021   IR ANGIO INTRA EXTRACRAN SEL COM CAROTID INNOMINATE UNI R MOD SED  11/16/2021   IR ANGIO INTRA EXTRACRAN SEL INTERNAL CAROTID UNI L MOD SED  11/16/2021   IR ANGIO VERTEBRAL SEL VERTEBRAL UNI R MOD SED  11/16/2021   IR ANGIOGRAM FOLLOW UP STUDY  11/17/2021   IR NEURO EACH ADD'L AFTER BASIC UNI LEFT (MS)  11/17/2021   IR NEURO EACH ADD'L AFTER BASIC UNI RIGHT (MS)  11/17/2021   IR TRANSCATH/EMBOLIZ  11/16/2021   IR US GUIDE VASC ACCESS RIGHT  11/16/2021   RADIOLOGY WITH ANESTHESIA N/A 11/16/2021   Procedure: IR WITH ANESTHESIA;  Surgeon: Baldemar Lenis, MD;  Location: [facility] OR;  Service: Radiology;  Laterality: N/A;   TOTAL HIP ARTHROPLASTY Right 12/21/2021   Procedure: RIGHT TOTAL HIP REPLACEMENT;  Surgeon: Tarry Kos, MD;  Location: [facility] OR;  Service: Orthopedics;  Laterality: Right;  3-C   WISDOM TOOTH EXTRACTION       Current Outpatient Medications on File Prior to Visit   Medication Sig Dispense Refill   b complex vitamins capsule Take 1 capsule by mouth daily.     baclofen (LIORESAL) 10 MG tablet Take 1 tablet (10 mg total) by mouth at bedtime as needed for muscle spasms. 30 each 0   brexpiprazole (REXULTI) 2 MG TABS tablet Take 1 tablet (2 mg total) by mouth daily. 30 tablet 2   busPIRone (BUSPAR) 30 MG tablet Take 1 tablet (30 mg total) by mouth 2 (two) times daily. 180 tablet 1   doxazosin (CARDURA) 4 MG tablet Take 1 tablet (4 mg total) by mouth at bedtime. 90 tablet 0   EPINEPHrine 0.3 mg/0.3 mL IJ SOAJ injection Inject 0.3 mg into the muscle once as needed (for a severe, allergic reaction).      levocetirizine (XYZAL) 5 MG tablet TAKE 1 TABLET BY MOUTH EVERY DAY IN THE EVENING 90 tablet 1   Oxcarbazepine (TRILEPTAL) 300 MG tablet Take 1 tablet (300 mg total) by mouth 2 (two) times daily. 60 tablet 0   traZODone (DESYREL) 50 MG tablet Take 1 tablet (50 mg total) by mouth at bedtime. 30 tablet 5   methylphenidate (RITALIN) 5 MG tablet Take 5 mg by mouth 2 (two) times daily with breakfast and lunch.     No current facility-administered medications on file prior to visit.    Allergies  Allergen Reactions   Iodinated Contrast Media Rash and Anaphylaxis   Penicillins Shortness Of Breath and Rash    Did it involve swelling of the face/tongue/throat, SOB, or low BP? Yes Did it involve sudden or severe rash/hives, skin peeling, or any reaction on the inside of your mouth or nose? No Did you need to seek medical attention at a hospital or doctor's office? No When did it last happen? Unk If all above answers are "NO", may proceed  with cephalosporin use.     Shellfish Allergy Anaphylaxis and Rash   Shellfish-Derived Products Anaphylaxis and Rash     DIAGNOSTIC DATA (LABS, IMAGING, TESTING) - I reviewed patient records, labs, notes, testing and imaging myself where available.  Lab Results  Component Value Date   WBC 5.0 03/23/2023   HGB 14.6 03/23/2023    HCT 42.2 03/23/2023   MCV 92.1 03/23/2023   PLT 150 03/23/2023      Component Value Date/Time   NA 138 03/30/2023 1336   NA 141 06/21/2013 1505   K 4.1 03/30/2023 1336   K 4.2 06/21/2013 1505   CL 103 03/30/2023 1336   CO2 28 03/30/2023 1336   CO2 25 06/21/2013 1505   GLUCOSE 85 03/30/2023 1336   GLUCOSE 83 06/21/2013 1505   BUN 9 03/30/2023 1336   BUN 18.1 06/21/2013 1505   CREATININE 0.97 03/30/2023 1336   CREATININE 1.03 12/15/2021 1522   CREATININE 0.9 06/21/2013 1505   CALCIUM 9.7 03/30/2023 1336   CALCIUM 9.1 06/21/2013 1505   PROT 5.8 (L) 01/16/2023 0526   PROT 7.0 06/21/2013 1505   ALBUMIN 3.4 (L) 01/16/2023 0526   ALBUMIN 4.1 06/21/2013 1505   AST 22 01/16/2023 0526   AST 15 12/15/2021 1522   AST 15 06/21/2013 1505   ALT 40 01/16/2023 0526   ALT 19 12/15/2021 1522   ALT 13 06/21/2013 1505   ALKPHOS 44 01/16/2023 0526   ALKPHOS 59 06/21/2013 1505   BILITOT 0.1 (L) 01/16/2023 0526   BILITOT 0.3 12/15/2021 1522   BILITOT 0.33 06/21/2013 1505   GFRNONAA >60 01/17/2023 0558   GFRNONAA >60 12/15/2021 1522   GFRAA >60 04/30/2020 1533   Lab Results  Component Value Date   CHOL 213 (H) 05/17/2021   HDL 53.60 05/17/2021   LDLDIRECT 127.0 05/17/2021   TRIG 271.0 (H) 05/17/2021   CHOLHDL 4 05/17/2021   No results found for: "HGBA1C" Lab Results  Component Value Date   VITAMINB12 502 11/22/2021   Lab Results  Component Value Date   TSH 1.08 03/30/2023    PHYSICAL EXAM:  Today's Vitals   06/21/23 1328  BP: 116/84  Pulse: 92  Weight: 196 lb (88.9 kg)  Height: 6\' 1"  (1.854 m)   Body mass index is 25.86 kg/m.   Wt Readings from Last 3 Encounters:  06/21/23 196 lb (88.9 kg)  05/29/23 203 lb (92.1 kg)  03/30/23 210 lb (95.3 kg)     Ht Readings from Last 3 Encounters:  06/21/23 6\' 1"  (1.854 m)  05/29/23 6\' 2"  (1.88 m)  03/30/23 6\' 2"  (1.88 m)      General: The patient is awake, alert and appears not in acute distress. The patient is well  groomed. Head: right temporal indentation . Neck is supple.  Mallampati 3,  neck circumference:16.5 inches . Nasal airflow  patent.  Dental status:  Cardiovascular:  Regular rate and cardiac rhythm by pulse,  without distended neck veins. Respiratory: Lungs are clear to auscultation.  Skin:  Without evidence of ankle edema, or rash. Trunk: The patient's posture is erect.   NEUROLOGIC EXAM: The patient is awake and alert, oriented to place and time.   Memory subjective described as intact.  Attention span & concentration ability appears normal.  Speech is fluent,  without  dysarthria, dysphonia or aphasia.  Mood and affect are appropriate.   Cranial nerves: no loss of smell or taste reported  Pupils are unequal , right is larger , both briskly reactive  to light. Extraocular movements in vertical and horizontal planes were intact and without nystagmus. No Diplopia. Visual fields by finger perimetry - limited vision on the left upper quadrant . Hearing was intact to soft voice and finger rubbing.    Facial sensation intact to fine touch.  Facial motor strength is symmetric and tongue and uvula move midline.  Neck ROM : rotation, tilt and flexion extension were normal for age and shoulder shrug was symmetrical.    Motor exam:  loss of left symmetric grip strength , right is intact. .     Coordination: Rapid alternating movements in the fingers/hands were of normal speed.  The Finger-to-nose maneuver was impaired on the left - evidence of ataxia, dysmetria and loss of sensation, disturbed propio-ception. Pronation.  "Alien hand".    Gait and station: Patient could rise unassisted from a seated position, walked without assistive device.  Stance is of greater width/ base and the patient turned with 4.5 steps.  Toe and heel walk were deferred.  Unable to perform toe- walking  Deep tendon reflexes: in the lower extremities are symmetric ! Upper extremities - antebrachial reflex is extra  brisk on the left  Babinski response was deferred     ASSESSMENT AND PLAN 47 y.o. year old male  here with:    1) slowly improving hemiparesis on the left after stroke with [facility] and  recurrent brain tumor.   2) reports hypersomnia -which is commonly seen with right brain lesions.  He is on anti- epileptic medications , also increasing daytime sleepiness . I offered a HST , His mother has apnea, his wife reports he snores.   3)  cognitive challenges due to right brain insult - I will support for him having predicable hours , more routine - 8 AM - 3 PM . This will support him cognitively and he can keep his full time employment.  I will order 10 mg adderall to help with alertness.     I plan to follow up either personally or through our NP within 4-6 months.   I would like to thank  Etta Grandchild, Md [address] [place],  [place] [address] for allowing me to meet with and to take care of this pleasant patient.     After spending a total time of  35  minutes face to face and additional time for physical and neurologic examination, review of laboratory studies,  personal review of imaging studies, reports and results of other testing and review of referral information / records as far as provided in visit,   Electronically signed by: Melvyn Novas, MD 06/21/2023 1:51 PM  [facility] Board certified by The [employer] of Sleep Medicine and Diplomate of the [employer] of Sleep Medicine. Board certified In Neurology through the ABPN, Fellow of the [employer] of Neurology.

## 2023-06-23 ENCOUNTER — Ambulatory Visit (INDEPENDENT_AMBULATORY_CARE_PROVIDER_SITE_OTHER): Payer: BC Managed Care – PPO | Admitting: Psychiatry

## 2023-06-23 ENCOUNTER — Encounter: Payer: Self-pay | Admitting: Psychiatry

## 2023-06-23 DIAGNOSIS — F4310 Post-traumatic stress disorder, unspecified: Secondary | ICD-10-CM

## 2023-06-23 DIAGNOSIS — F515 Nightmare disorder: Secondary | ICD-10-CM

## 2023-06-23 DIAGNOSIS — F330 Major depressive disorder, recurrent, mild: Secondary | ICD-10-CM

## 2023-06-23 MED ORDER — DOXAZOSIN MESYLATE 4 MG PO TABS: 4.0000 mg | ORAL_TABLET | Freq: Every day | ORAL | 0 refills | Status: DC

## 2023-06-23 MED ORDER — BUSPIRONE HCL 30 MG PO TABS: 30.0000 mg | ORAL_TABLET | Freq: Two times a day (BID) | ORAL | 1 refills | Status: AC

## 2023-06-23 MED ORDER — BREXPIPRAZOLE 2 MG PO TABS: 2.0000 mg | ORAL_TABLET | Freq: Every day | ORAL | 2 refills | Status: DC

## 2023-06-23 MED ORDER — ESCITALOPRAM OXALATE 10 MG PO TABS: ORAL_TABLET | ORAL | 1 refills | Status: DC

## 2023-06-23 NOTE — Progress Notes (Signed)
Shawn York [id number] Mar 16, 1976 47 y.o.  Subjective:   Patient ID:  Shawn York is a 47 y.o. (DOB [birth year]) male.  Chief Complaint:  Chief Complaint  Patient presents with   Anxiety    HPI Shawn York presents to the office today for follow-up of depression and anxiety.   He reports that he was not "feeling anything from the Ritalin" and was advised that he could taper off. 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 He reports that he is not experiencing any significant benefit with Adderall. He reports that he will sometimes realize that he left the faucet on or the burner on. He reports distractibility, such as forgetting to do something if also engaged in conversation. He reports that he is occasionally losing track of time and forgetting to switch stations at work.   He reports frustration with difficulty doing things with his left hand that has numbness. 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 He reports that he is continuing to be able to make some progress with some tasks, like putting on certain items of clothing. He reports that "I feel like it is ok," in terms of depression. He reports energy and motivation are "alright." He reports that he prefers to stay busy. He reports, "I've been a little anxious." Denies any recent panic attacks. He reports PTSD symptoms have been "accentuated." He notices some hypervigilance. He reports that he has had a "few" nightmares. Reports flashbacks have not been "too bad." He reports fleeting suicidal thoughts without intent. Contracts for safety.   He reports that he experienced middle of the night awakening last night after taking Adderall mid-day. He reports that he occasionally will wake up at 3 am and that recently he has been sleeping through the night.  He has returned to work with  accommodations. He notices that it will occasionally take longer for him to process what a customer has asked.  He reports that he and his wife are "splitting up."   He reports that he has not seen in his therapist in a few months.   He reports Trazodone is helpful for his insomnia.    Past Psychiatric Medication Trials: Trintellix Rexulti Trileptal Buspar Remeron Zoloft- Sexual side effects. "Took the edge off a little bit."  Prozac- Sexual side effects Viibryd- Severe GI side effects Hydroxyzine- Drowsiness Dayvigo-excessive somnolence Doxazosin-helpful for nightmares Hydroxyzine- had withdrawal Ritalin Rexulti  AIMS    Flowsheet Row Office Visit from 02/22/2023 in [facility] Office Visit from 12/12/2022 in [facility]  AIMS Total Score 0 0      AUDIT    Flowsheet Row Office Visit from 03/30/2023 in [facility]  Alcohol Use Disorder Identification Test Final Score (AUDIT) 7       PHQ2-9    Flowsheet Row Office Visit from 02/15/2023 in [facility] Office Visit from 02/28/2022 in [facility] Office Visit from 10/12/2021 in [facility] at [facility] Office Visit from 03/18/2021 in [facility] Office Visit from 02/18/2021 in [facility]  PHQ-2 Total Score 2 2 2 2 3   PHQ-9 Total Score 9 11 4 5  12  Flowsheet Row Admission (Discharged) from 01/06/2023 in [facility] Pre-Admission Testing 60 from 12/14/2021 in [facility] PREADMISSION TESTING ED from 11/21/2021 in [facility] Emergency Department at [facility]  C-SSRS RISK CATEGORY No Risk No Risk No Risk        Review of Systems:  Review of Systems  HENT:  Positive for hearing loss.    Musculoskeletal:  Negative for gait problem.  Neurological:  Positive for weakness and numbness.  Psychiatric/Behavioral:         Please refer to HPI    Medications: I have reviewed the patient's current medications.  Current Outpatient Medications  Medication Sig Dispense Refill   amphetamine-dextroamphetamine (ADDERALL) 10 MG tablet Take 1 tablet (10 mg total) by mouth daily with breakfast. 30 tablet 0   baclofen (LIORESAL) 10 MG tablet Take 1 tablet (10 mg total) by mouth at bedtime as needed for muscle spasms. 30 each 0   escitalopram (LEXAPRO) 10 MG tablet Take 1/2 tablet daily for 4 days, then 1 tablet daily 30 tablet 1   b complex vitamins capsule Take 1 capsule by mouth daily.     brexpiprazole (REXULTI) 2 MG TABS tablet Take 1 tablet (2 mg total) by mouth daily. 30 tablet 2   busPIRone (BUSPAR) 30 MG tablet Take 1 tablet (30 mg total) by mouth 2 (two) times daily. 180 tablet 1   doxazosin (CARDURA) 4 MG tablet Take 1 tablet (4 mg total) by mouth at bedtime. 90 tablet 0   EPINEPHrine 0.3 mg/0.3 mL IJ SOAJ injection Inject 0.3 mg into the muscle once as needed (for a severe, allergic reaction).      levocetirizine (XYZAL) 5 MG tablet TAKE 1 TABLET BY MOUTH EVERY DAY IN THE EVENING 90 tablet 1   Oxcarbazepine (TRILEPTAL) 300 MG tablet Take 1 tablet (300 mg total) by mouth 2 (two) times daily. 60 tablet 0   traZODone (DESYREL) 50 MG tablet Take 1 tablet (50 mg total) by mouth at bedtime. 30 tablet 5   No current facility-administered medications for this visit.    Medication Side Effects: Other: Possible mild increased anxiety with Adderall  Allergies:  Allergies  Allergen Reactions   Iodinated Contrast Media Rash and Anaphylaxis   Penicillins Shortness Of Breath and Rash    Did it involve swelling of the face/tongue/throat, SOB, or low BP? Yes Did it involve sudden or severe rash/hives, skin peeling, or any reaction on the inside of your mouth or nose? No Did you need to  seek medical attention at a hospital or doctor's office? No When did it last happen? Unk If all above answers are "NO", may proceed with cephalosporin use.     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   Please see review of systems for further details on the patient's review from today.   Objective:   Physical Exam:  There were no vitals taken for this visit.  Physical Exam Constitutional:  General: He is not in acute distress. Musculoskeletal:        General: No deformity.  Neurological:     Mental Status: He is alert and oriented to person, place, and time.     Coordination: Coordination normal.  Psychiatric:        Attention and Perception: Attention and perception normal. He does not perceive auditory or visual hallucinations.        Mood and Affect: Mood is anxious. Affect is not labile, blunt, angry or inappropriate.        Speech: Speech normal.        Behavior: Behavior normal.        Thought Content: Thought content normal. Thought content is not paranoid or delusional. Thought content does not include homicidal or suicidal ideation. Thought content does not include homicidal or suicidal plan.        Cognition and Memory: Cognition and memory normal.        Judgment: Judgment normal.     Comments: Insight intact Mood is frustrated and mildly depressed in response to physical limitations     Lab Review:     Component Value Date/Time   NA 138 03/30/2023 1336   NA 141 06/21/2013 1505   K 4.1 03/30/2023 1336   K 4.2  06/21/2013 1505   CL 103 03/30/2023 1336   CO2 28 03/30/2023 1336   CO2 25 06/21/2013 1505   GLUCOSE 85 03/30/2023 1336   GLUCOSE 83 06/21/2013 1505   BUN 9 03/30/2023 1336   BUN 18.1 06/21/2013 1505   CREATININE 0.97 03/30/2023 1336   CREATININE 1.03 12/15/2021 1522   CREATININE 0.9 06/21/2013 1505   CALCIUM 9.7 03/30/2023 1336   CALCIUM 9.1 06/21/2013 1505   PROT 5.8 (L) 01/16/2023 0526   PROT 7.0 06/21/2013 1505   ALBUMIN 3.4 (L) 01/16/2023 0526   ALBUMIN 4.1 06/21/2013 1505   AST 22 01/16/2023 0526   AST 15 12/15/2021 1522   AST 15 06/21/2013 1505   ALT 40 01/16/2023 0526   ALT 19 12/15/2021 1522   ALT 13 06/21/2013 1505   ALKPHOS 44 01/16/2023 0526   ALKPHOS 59 06/21/2013 1505   BILITOT 0.1 (L) 01/16/2023 0526   BILITOT 0.3 12/15/2021 1522   BILITOT 0.33 06/21/2013 1505   GFRNONAA >60 01/17/2023 0558   GFRNONAA >60 12/15/2021 1522   GFRAA >60 04/30/2020 1533       Component Value Date/Time   WBC 5.0 03/23/2023 1120   RBC 4.58 03/23/2023 1120   HGB 14.6 03/23/2023 1120   HGB 11.6 (L) 12/15/2021 1522   HGB 13.3 06/21/2013 1505   HCT 42.2 03/23/2023 1120   HCT 38.7 06/21/2013 1505   PLT 150 03/23/2023 1120   PLT 145 (L) 12/15/2021 1522   PLT 154 06/21/2013 1505   MCV 92.1 03/23/2023 1120   MCV 99.0 (H) 06/21/2013 1505   MCH 31.9 03/23/2023 1120   MCHC 34.6 03/23/2023 1120   RDW 12.3 03/23/2023 1120   RDW 13.3 06/21/2013 1505   LYMPHSABS 1.7 03/23/2023 1120   LYMPHSABS 1.1 06/21/2013 1505   MONOABS 0.5 03/23/2023 1120   MONOABS 0.5 06/21/2013 1505   EOSABS 0.1 03/23/2023 1120   EOSABS 0.1 06/21/2013 1505   BASOSABS 0.0 03/23/2023 1120   BASOSABS 0.0 06/21/2013 1505    No results found for: "POCLITH", "LITHIUM"   No results found for: "PHENYTOIN", "PHENOBARB", "VALPROATE", "CBMZ"   .res Assessment: Plan:    32 minutes spent dedicated to the care of this  patient on the date of this encounter to include pre-visit review of records, ordering of  medication, post visit documentation, and face-to-face time with the patient discussing treatment options for anxiety since he reports that he has recently been experiencing increased anxiety. Discussed potential benefits, risks, and side effects of Lexapro. Will start Lexapro 5 mg for 4 days, then increase to 10 mg daily for anxiety and depression.  Continue Buspar 30 mg po BID for anxiety. Continue Doxazosin 4 mg at bedtime for suppression of nightmares.  Continue Rexulti 2 mg po every day for depression.  Pt to follow-up with this provider in 4 weeks or sooner if clinically indicated.  Patient advised to contact office with any questions, adverse effects, or acute worsening in signs and symptoms.   Lorenza was seen today for anxiety.  Diagnoses and all orders for this visit:  PTSD (post-traumatic stress disorder) -     escitalopram (LEXAPRO) 10 MG tablet; Take 1/2 tablet daily for 4 days, then 1 tablet daily -     doxazosin (CARDURA) 4 MG tablet; Take 1 tablet (4 mg total) by mouth at bedtime. -     busPIRone (BUSPAR) 30 MG tablet; Take 1 tablet (30 mg total) by mouth 2 (two) times daily.  MDD (major depressive disorder), recurrent episode, mild (HCC) -     escitalopram (LEXAPRO) 10 MG tablet; Take 1/2 tablet daily for 4 days, then 1 tablet daily -     brexpiprazole (REXULTI) 2 MG TABS tablet; Take 1 tablet (2 mg total) by mouth daily.  Nightmares -     doxazosin (CARDURA) 4 MG tablet; Take 1 tablet (4 mg total) by mouth at bedtime.     Please see After Visit Summary for patient specific instructions.  Future Appointments  Date Time Provider Department Center  07/03/2023 10:00 AM Etta Grandchild, MD [facility] None  09/25/2023 10:00 AM Angelina Sheriff, DO [facility]    No orders of the defined types were placed in this encounter.   -------------------------------

## 2023-06-27 ENCOUNTER — Other Ambulatory Visit: Payer: Self-pay | Admitting: Physical Medicine and Rehabilitation

## 2023-07-03 ENCOUNTER — Ambulatory Visit: Payer: BC Managed Care – PPO | Admitting: Internal Medicine

## 2023-07-05 ENCOUNTER — Telehealth: Payer: Self-pay | Admitting: Neurology

## 2023-07-05 NOTE — Telephone Encounter (Signed)
 [facility] [employer] [place] pending faxed notes

## 2023-07-10 DIAGNOSIS — C712 Malignant neoplasm of temporal lobe: Secondary | ICD-10-CM | POA: Diagnosis not present

## 2023-07-11 ENCOUNTER — Encounter: Payer: Self-pay | Admitting: Internal Medicine

## 2023-07-11 NOTE — Telephone Encounter (Signed)
[facility] [employer] [place] no auth req via fax & [employer] no auth req ref [employer] W on 07/11/23

## 2023-07-12 ENCOUNTER — Encounter: Payer: Self-pay | Admitting: Psychiatry

## 2023-07-14 ENCOUNTER — Other Ambulatory Visit: Payer: Self-pay | Admitting: Neurology

## 2023-07-14 ENCOUNTER — Encounter: Payer: Self-pay | Admitting: Neurology

## 2023-07-14 MED ORDER — AMPHETAMINE-DEXTROAMPHETAMINE 10 MG PO TABS: 10.0000 mg | ORAL_TABLET | Freq: Two times a day (BID) | ORAL | 0 refills | Status: DC

## 2023-07-24 ENCOUNTER — Ambulatory Visit (INDEPENDENT_AMBULATORY_CARE_PROVIDER_SITE_OTHER): Payer: BC Managed Care – PPO | Admitting: Internal Medicine

## 2023-07-24 ENCOUNTER — Encounter: Payer: Self-pay | Admitting: Internal Medicine

## 2023-07-24 VITALS — BP 138/96 | HR 81 | Temp 98.6°F | Resp 16 | Ht 73.0 in | Wt 189.4 lb

## 2023-07-24 DIAGNOSIS — Z0001 Encounter for general adult medical examination with abnormal findings: Secondary | ICD-10-CM | POA: Diagnosis not present

## 2023-07-24 DIAGNOSIS — E785 Hyperlipidemia, unspecified: Secondary | ICD-10-CM

## 2023-07-24 DIAGNOSIS — Z1211 Encounter for screening for malignant neoplasm of colon: Secondary | ICD-10-CM

## 2023-07-24 DIAGNOSIS — K2100 Gastro-esophageal reflux disease with esophagitis, without bleeding: Secondary | ICD-10-CM | POA: Diagnosis not present

## 2023-07-24 DIAGNOSIS — I10 Essential (primary) hypertension: Secondary | ICD-10-CM | POA: Diagnosis not present

## 2023-07-24 MED ORDER — INDAPAMIDE 1.25 MG PO TABS: 1.2500 mg | ORAL_TABLET | Freq: Every day | ORAL | 0 refills | Status: DC

## 2023-07-24 MED ORDER — VOQUEZNA 20 MG PO TABS: 1.0000 | ORAL_TABLET | Freq: Every day | ORAL | Status: DC

## 2023-07-24 NOTE — Patient Instructions (Signed)
Health Maintenance, Male Adopting a healthy lifestyle and getting preventive care are important in promoting health and wellness. Ask your health care provider about: The right schedule for you to have regular tests and exams. Things you can do on your own to prevent diseases and keep yourself healthy. What should I know about diet, weight, and exercise? Eat a healthy diet  Eat a diet that includes plenty of vegetables, fruits, low-fat dairy products, and lean protein. Do not eat a lot of foods that are high in solid fats, added sugars, or sodium. Maintain a healthy weight Body mass index (BMI) is a measurement that can be used to identify possible weight problems. It estimates body fat based on height and weight. Your health care provider can help determine your BMI and help you achieve or maintain a healthy weight. Get regular exercise Get regular exercise. This is one of the most important things you can do for your health. Most adults should: Exercise for at least 150 minutes each week. The exercise should increase your heart rate and make you sweat (moderate-intensity exercise). Do strengthening exercises at least twice a week. This is in addition to the moderate-intensity exercise. Spend less time sitting. Even light physical activity can be beneficial. Watch cholesterol and blood lipids Have your blood tested for lipids and cholesterol at 47 years of age, then have this test every 5 years. You may need to have your cholesterol levels checked more often if: Your lipid or cholesterol levels are high. You are older than 47 years of age. You are at high risk for heart disease. What should I know about cancer screening? Many types of cancers can be detected early and may often be prevented. Depending on your health history and family history, you may need to have cancer screening at various ages. This may include screening for: Colorectal cancer. Prostate cancer. Skin cancer. Lung  cancer. What should I know about heart disease, diabetes, and high blood pressure? Blood pressure and heart disease High blood pressure causes heart disease and increases the risk of stroke. This is more likely to develop in people who have high blood pressure readings or are overweight. Talk with your health care provider about your target blood pressure readings. Have your blood pressure checked: Every 3-5 years if you are 18-39 years of age. Every year if you are 40 years old or older. If you are between the ages of 65 and 75 and are a current or former smoker, ask your health care provider if you should have a one-time screening for abdominal aortic aneurysm (AAA). Diabetes Have regular diabetes screenings. This checks your fasting blood sugar level. Have the screening done: Once every three years after age 45 if you are at a normal weight and have a low risk for diabetes. More often and at a younger age if you are overweight or have a high risk for diabetes. What should I know about preventing infection? Hepatitis B If you have a higher risk for hepatitis B, you should be screened for this virus. Talk with your health care provider to find out if you are at risk for hepatitis B infection. Hepatitis C Blood testing is recommended for: Everyone born from 1945 through 1965. Anyone with known risk factors for hepatitis C. Sexually transmitted infections (STIs) You should be screened each year for STIs, including gonorrhea and chlamydia, if: You are sexually active and are younger than 47 years of age. You are older than 47 years of age and your   health care provider tells you that you are at risk for this type of infection. Your sexual activity has changed since you were last screened, and you are at increased risk for chlamydia or gonorrhea. Ask your health care provider if you are at risk. Ask your health care provider about whether you are at high risk for HIV. Your health care provider  may recommend a prescription medicine to help prevent HIV infection. If you choose to take medicine to prevent HIV, you should first get tested for HIV. You should then be tested every 3 months for as long as you are taking the medicine. Follow these instructions at home: Alcohol use Do not drink alcohol if your health care provider tells you not to drink. If you drink alcohol: Limit how much you have to 0-2 drinks a day. Know how much alcohol is in your drink. In the [place] one drink equals one 12 oz bottle of beer (355 mL), one 5 oz glass of wine (148 mL), or one 1 oz glass of hard liquor (44 mL). Lifestyle Do not use any products that contain nicotine or tobacco. These products include cigarettes, chewing tobacco, and vaping devices, such as e-cigarettes. If you need help quitting, ask your health care provider. Do not use street drugs. Do not share needles. Ask your health care provider for help if you need support or information about quitting drugs. General instructions Schedule regular health, dental, and eye exams. Stay current with your vaccines. Tell your health care provider if: You often feel depressed. You have ever been abused or do not feel safe at home. Summary Adopting a healthy lifestyle and getting preventive care are important in promoting health and wellness. Follow your health care provider's instructions about healthy diet, exercising, and getting tested or screened for diseases. Follow your health care provider's instructions on monitoring your cholesterol and blood pressure. This information is not intended to replace advice given to you by your health care provider. Make sure you discuss any questions you have with your health care provider. Document Revised: 01/04/2021 Document Reviewed: 01/04/2021 Elsevier Patient Education  2024 [employer]  

## 2023-07-24 NOTE — Progress Notes (Signed)
Subjective:  Patient ID: Shawn York, male    DOB: [birth year]  Age: 47 y.o. MRN: [id number]  CC: Annual Exam, Hypertension, Gastroesophageal Reflux, and Hyperlipidemia   HPI Shawn York presents for a CPX and f/up ----  Discussed the use of AI scribe software for clinical note transcription with the patient, who gave verbal consent to proceed.  History of Present Illness   The patient, with a history of neurological issues, continues to experience paresis, which he describes as slow to improve and frustrating. He also reports forgetfulness, which has led to difficulties in his home life, including a pending separation from his spouse. The patient denies any current chest pain or shortness of breath but describes a sensation of a 'balloon' in his esophagus after taking medication, which has occurred a few times over the past few days. He also reports occasional heartburn and indigestion.  The patient is currently undergoing chemotherapy and has noticed weight loss, to the point where his wedding ring fell off at work. He denies any symptoms related to his known aneurysm. He also reports taking Adderall, which he acknowledges may be contributing to his elevated blood pressure.  The patient has been vaccinated for both flu and COVID-19 and has been offered colon cancer screening due to his age. He is considering a home-based screening option.   He is also taking Cardura for high blood pressure.       Outpatient Medications Prior to Visit  Medication Sig Dispense Refill   amphetamine-dextroamphetamine (ADDERALL) 10 MG tablet Take 1 tablet (10 mg total) by mouth 2 (two) times daily with a meal. 60 tablet 0   b complex vitamins capsule Take 1 capsule by mouth daily.     baclofen (LIORESAL) 10 MG tablet TAKE 1 TABLET BY MOUTH AT BEDTIME AS NEEDED FOR MUSCLE SPASMS 30 tablet 5   brexpiprazole (REXULTI) 2 MG TABS tablet Take 1 tablet (2 mg total) by mouth daily. 30 tablet 2   busPIRone (BUSPAR)  30 MG tablet Take 1 tablet (30 mg total) by mouth 2 (two) times daily. 180 tablet 1   doxazosin (CARDURA) 4 MG tablet Take 1 tablet (4 mg total) by mouth at bedtime. 90 tablet 0   EPINEPHrine 0.3 mg/0.3 mL IJ SOAJ injection Inject 0.3 mg into the muscle once as needed (for a severe, allergic reaction).      escitalopram (LEXAPRO) 10 MG tablet Take 1/2 tablet daily for 4 days, then 1 tablet daily (Patient taking differently: Take 10 mg by mouth daily.) 30 tablet 1   levocetirizine (XYZAL) 5 MG tablet TAKE 1 TABLET BY MOUTH EVERY DAY IN THE EVENING 90 tablet 1   Oxcarbazepine (TRILEPTAL) 300 MG tablet Take 1 tablet (300 mg total) by mouth 2 (two) times daily. 60 tablet 0   temozolomide (TEMODAR) 100 MG capsule Take 100 mg by mouth daily. 5 days on 28 days off     temozolomide (TEMODAR) 250 MG capsule Take 250 mg by mouth daily. 5 days on and 28 days off     traZODone (DESYREL) 50 MG tablet Take 1 tablet (50 mg total) by mouth at bedtime. 30 tablet 5   No facility-administered medications prior to visit.    ROS Review of Systems  Objective:  BP (!) 138/96 (BP Location: Left Arm, Patient Position: Sitting, Cuff Size: Normal)   Pulse 81   Temp 98.6 F (37 C) (Oral)   Resp 16   Ht 6\' 1"  (1.854 m)   Wt 189 lb 6.4  oz (85.9 kg)   SpO2 99%   BMI 24.99 kg/m   BP Readings from Last 3 Encounters:  07/24/23 (!) 138/96  06/21/23 116/84  05/29/23 122/85    Wt Readings from Last 3 Encounters:  07/24/23 189 lb 6.4 oz (85.9 kg)  06/21/23 196 lb (88.9 kg)  05/29/23 203 lb (92.1 kg)    Physical Exam  Lab Results  Component Value Date   WBC 5.0 03/23/2023   HGB 14.6 03/23/2023   HCT 42.2 03/23/2023   PLT 150 03/23/2023   GLUCOSE 85 03/30/2023   CHOL 213 (H) 05/17/2021   TRIG 271.0 (H) 05/17/2021   HDL 53.60 05/17/2021   LDLDIRECT 127.0 05/17/2021   ALT 40 01/16/2023   AST 22 01/16/2023   NA 138 03/30/2023   K 4.1 03/30/2023   CL 103 03/30/2023   CREATININE 0.97 03/30/2023   BUN 9  03/30/2023   CO2 28 03/30/2023   TSH 1.08 03/30/2023   INR 1.0 12/15/2021    MR BRAIN W WO CONTRAST  Result Date: 05/11/2023 CLINICAL DATA:  47 year old male with 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 Most recent resection in April or May of this year. Restaging. EXAM: MRI HEAD WITHOUT AND WITH CONTRAST TECHNIQUE: Multiplanar, multiecho pulse sequences of the brain and surrounding structures were obtained without and with intravenous contrast. CONTRAST:  10 mL Vueway COMPARISON:  Brain MRI 02/15/2023 and earlier. FINDINGS: Brain: Subtotal right temporal lobe resection changes. Stable resection cavity since June. Patchy T2 and FLAIR hyperintensity along the posterior resection margin, affecting the inferior and posterior right insula appears stable. And patchy right operculum cortical and subcortical white matter T2/FLAIR hyperintensity have regressed. Mild ex vacuo enlargement of the right lateral ventricle. No regional mass effect. Regressed signal heterogeneity on DWI, no suspicious diffusion changes now. Stable hemosiderin. Following contrast no masslike or suspicious postcontrast enhancement now. Overlying postoperative small right subdural collection and/or dural thickening has also regressed. No superimposed restricted diffusion suggestive of acute infarction. No midline shift or acute intracranial hemorrhage. Cervicomedullary junction and pituitary are within normal limits. No new signal changes elsewhere in the brain. Right hemisphere superficial siderosis outside of the immediate resection site. Vascular: Major intracranial vascular flow voids are stable. Following contrast the major dural venous sinuses are enhancing and appear to be patent. Skull and upper cervical spine: Right side craniotomy changes. Background bone marrow signal within normal limits. Negative visible cervical spine, spinal cord. Sinuses/Orbits: Stable, negative. Other: Right mastoid effusion has largely  resolved. Visible internal auditory structures appear normal. Negative visible scalp and face. IMPRESSION: Satisfactory post resection appearance. No new intracranial abnormality. Electronically Signed   By: Odessa Fleming M.D.   On: 05/11/2023 10:16    Assessment & Plan:  Primary hypertension -     Aldosterone + renin activity w/ ratio; Future -     Indapamide; Take 1 tablet (1.25 mg total) by mouth daily.  Dispense: 90 tablet; Refill: 0  Dyslipidemia, goal LDL below 130 -     Lipid panel; Future  Gastroesophageal reflux disease with esophagitis without hemorrhage -     CBC with Differential/Platelet; Future -     Voquezna; Take 1 tablet by mouth daily.  Encounter for general adult medical examination with abnormal findings     Follow-up: Return in about 3 months (around 10/24/2023).  Sanda Linger, MD

## 2023-07-25 LAB — CBC WITH DIFFERENTIAL/PLATELET
Basophils Absolute: 0 10*3/uL (ref 0.0–0.1)
Basophils Relative: 0.9 % (ref 0.0–3.0)
Eosinophils Absolute: 0 10*3/uL (ref 0.0–0.7)
Eosinophils Relative: 0.8 % (ref 0.0–5.0)
HCT: 40.9 % (ref 39.0–52.0)
Hemoglobin: 14.1 g/dL (ref 13.0–17.0)
Lymphocytes Relative: 25.6 % (ref 12.0–46.0)
Lymphs Abs: 1.2 10*3/uL (ref 0.7–4.0)
MCHC: 34.4 g/dL (ref 30.0–36.0)
MCV: 95.8 fL (ref 78.0–100.0)
Monocytes Absolute: 0.4 10*3/uL (ref 0.1–1.0)
Monocytes Relative: 7.6 % (ref 3.0–12.0)
Neutro Abs: 3.2 10*3/uL (ref 1.4–7.7)
Neutrophils Relative %: 65.1 % (ref 43.0–77.0)
Platelets: 200 10*3/uL (ref 150.0–400.0)
RBC: 4.26 Mil/uL (ref 4.22–5.81)
RDW: 14.3 % (ref 11.5–15.5)
WBC: 4.9 10*3/uL (ref 4.0–10.5)

## 2023-07-25 LAB — LIPID PANEL
Cholesterol: 170 mg/dL (ref 0–200)
HDL: 63.5 mg/dL (ref >39.00)
LDL Cholesterol: 97 mg/dL (ref 0–99)
NonHDL: 106.26
Total CHOL/HDL Ratio: 3
Triglycerides: 44 mg/dL (ref 0.0–149.0)
VLDL: 8.8 mg/dL (ref 0.0–40.0)

## 2023-07-31 DIAGNOSIS — Z1211 Encounter for screening for malignant neoplasm of colon: Secondary | ICD-10-CM | POA: Diagnosis not present

## 2023-07-31 LAB — ALDOSTERONE + RENIN ACTIVITY W/ RATIO
ALDO / PRA Ratio: 2.3 {ratio} (ref 0.9–28.9)
Aldosterone: 5 ng/dL
Renin Activity: 2.16 ng/mL/h (ref 0.25–5.82)

## 2023-08-09 DIAGNOSIS — C712 Malignant neoplasm of temporal lobe: Secondary | ICD-10-CM | POA: Diagnosis not present

## 2023-08-09 DIAGNOSIS — Z9889 Other specified postprocedural states: Secondary | ICD-10-CM | POA: Diagnosis not present

## 2023-08-14 ENCOUNTER — Other Ambulatory Visit: Payer: Self-pay | Admitting: Physical Medicine and Rehabilitation

## 2023-08-18 LAB — COLOGUARD: COLOGUARD: NEGATIVE

## 2023-08-19 ENCOUNTER — Other Ambulatory Visit: Payer: Self-pay | Admitting: Psychiatry

## 2023-08-19 DIAGNOSIS — F4310 Post-traumatic stress disorder, unspecified: Secondary | ICD-10-CM

## 2023-08-19 DIAGNOSIS — F330 Major depressive disorder, recurrent, mild: Secondary | ICD-10-CM

## 2023-09-11 DIAGNOSIS — C712 Malignant neoplasm of temporal lobe: Secondary | ICD-10-CM | POA: Diagnosis not present

## 2023-09-18 ENCOUNTER — Other Ambulatory Visit (HOSPITAL_COMMUNITY): Payer: Self-pay | Attending: Medical Genetics

## 2023-09-25 ENCOUNTER — Encounter
Payer: BC Managed Care – PPO | Attending: Physical Medicine and Rehabilitation | Admitting: Physical Medicine and Rehabilitation

## 2023-09-25 VITALS — BP 112/76 | HR 72 | Ht 73.0 in | Wt 182.0 lb

## 2023-09-25 DIAGNOSIS — R252 Cramp and spasm: Secondary | ICD-10-CM | POA: Diagnosis not present

## 2023-09-25 DIAGNOSIS — R29818 Other symptoms and signs involving the nervous system: Secondary | ICD-10-CM | POA: Diagnosis not present

## 2023-09-25 DIAGNOSIS — G8194 Hemiplegia, unspecified affecting left nondominant side: Secondary | ICD-10-CM

## 2023-09-25 DIAGNOSIS — C719 Malignant neoplasm of brain, unspecified: Secondary | ICD-10-CM

## 2023-09-25 DIAGNOSIS — M792 Neuralgia and neuritis, unspecified: Secondary | ICD-10-CM

## 2023-09-25 DIAGNOSIS — R4189 Other symptoms and signs involving cognitive functions and awareness: Secondary | ICD-10-CM

## 2023-09-25 MED ORDER — GABAPENTIN 100 MG PO CAPS: 100.0000 mg | ORAL_CAPSULE | Freq: Three times a day (TID) | ORAL | 5 refills | Status: DC

## 2023-09-25 NOTE — Patient Instructions (Addendum)
Stop balclofen  Start gabapentin 100 mg three times daily; 2 weeks from now message me through mychart to let me know how it is going.  I am referring you to Dr. Kieth Brightly for neuropsychology. Continue Adderall per neurology.   Letter for disability will be completed this week; I will message you for pickup when it is ready

## 2023-09-25 NOTE — Progress Notes (Signed)
Subjective:    Patient ID: Shawn York, male    DOB: 01/01/1976, 48 y.o.   MRN: [id number]  HPI  Shawn York is a 48 y.o. year old male  who  has a past medical history of Anemia, Avascular necrosis (HCC), Brain cancer (HCC) (02/25/2011), Depression, Epistaxis, Headache, Hypertension, PTSD (post-traumatic stress disorder), and Seizures (HCC).    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 Lavonna Monarch at [facility], complicated by R MCA bleeding with L hemiparesis.  Plan from last visit:  Left hemiparesis (HCC) Oligodendroglioma (HCC) Continue following with [facility] for oncology care; Follow-up with me in 4 months.  Ongoing deficits include memory, R attention and hearing, depression, Left hand weakness and spasticity   Overall continues with motor improvements in the left hand, had now returned to work at [employer] with some lifting restrictions.   Last imaging with oncology with ? Flair enhancement, uncertain significance, will follow up with their office regarding further treatment.    Cognitive deficits Return to neuropsychology Dr. Metro Kung at [facility], given that you have had evaluations with them in the past, for possible repeat neuropsych evaluation and recommendations.   Today, we talked about possible medications for your cognitive deficits.  Given that your primary issue is not related to fatigue or concentration, I agree that Ritalin is likely not very beneficial and we can try weaning off of this medication.  I would start with 5 mg twice a day for 2 weeks, then if you notice no changes you can stop this medication.   Spasticity Instead of resuming Lyrica, we are going to try baclofen for your hand tightness.  Start with 10 mg at nighttime as needed.  After 2 weeks, message me through MyChart, and if this is helping we may increase it up to 2-3  times daily.   Other orders -     Baclofen; Take 1 tablet (10 mg total) by mouth at bedtime as needed for muscle spasms.  Dispense: 30 each; Refill: 0   Interval Hx:  - Therapies: Has been trying home exercises with left hand especially playing guitar, dropping lot of items. He stretches it every day. Occasional weakness in his left foot with toe dragging when he is tired.   Is now working closing shift at [employer]; has adjusted to this and feels he is sleeping well.    - Follow ups:  Neuropsychology Dr. Metro Kung  - did not make an appointment with her. Is ok with seeing Dr. Kieth Brightly instead; has noticed fatigue, memory issues, processing difficulty and concentration problems ongoing.    - Falls: Had a fall a week ago; "it was cold and I was coming in form outside, tried to grab my sleeves and had my hands behind my back and my left leg gave out. I was on hard wood and not concrete or anything, fell on my butt." Denies residual pain.    - DME: No bracing or splints    - Medications:   Baclofen - unsure if this is helping; just on 10 mg at nighttime, notices it does make him drowsy into the next morning.   Ritalin - off of this; is being prescribed Adderall by Dr[facility] Vic[facility] Huger at [facility] for "I'll see things and it just wont register or process." Notices he zones out or gets distracted easily. Has noticed a benefit since starting the adderall.    -  Other concerns: Patient feels he has gotten no sense of "prognosis".."this hand feels like it's part of a cadaver... it takes a lot of mental energy to make it do what I want it to do." Notices stinging in the hand that is painful, especially when it is cold.     Pain Inventory Average Pain 4 Pain Right Now 4 My pain is constant and aching  In the last 24 hours, has pain interfered with the following? General activity 0 Relation with others 0 Enjoyment of life 4 What TIME of day is your pain at its worst? varies Sleep (in general)  Good  Pain is worse with:  cold weather Pain improves with:  unsure Relief from Meds:  na  Family History  Problem Relation Age of Onset   Cancer Other        brain cancer   Cancer Father        skin cancer   Cancer Maternal Grandmother 61       brain cancer   Alcohol abuse Brother    Alzheimer's disease Paternal Grandmother    Dementia Paternal Grandmother    Heart disease Paternal Grandfather    Social History   Socioeconomic History   Marital status: Married    Spouse name: Not on file   Number of children: Not on file   Years of education: Not on file   Highest education level: Some college, no degree  Occupational History   Not on file  Tobacco Use   Smoking status: Former    Current packs/day: 0.00    Types: Cigarettes    Quit date: 02/25/2011    Years since quitting: 12.5   Smokeless tobacco: Never  Vaping Use   Vaping status: Never Used  Substance and Sexual Activity   Alcohol use: Not Currently    Alcohol/week: 14.0 standard drinks of alcohol    Types: 14 Cans of beer per week    Comment: quit 11-08-2021   Drug use: No   Sexual activity: Yes    Comment: E-cigarette users  Other Topics Concern   Not on file  Social History Narrative   Not on file   Social Drivers of Health   Financial Resource Strain: Low Risk  (07/23/2023)   Overall Financial Resource Strain (CARDIA)    Difficulty of Paying Living Expenses: Not hard at all  Food Insecurity: No Food Insecurity (07/23/2023)   Hunger Vital Sign    Worried About Running Out of Food in the Last Year: Never true    Ran Out of Food in the Last Year: Never true  Transportation Needs: No Transportation Needs (07/23/2023)   PRAPARE - Administrator, Civil Service (Medical): No    Lack of Transportation (Non-Medical): No  Physical Activity: Sufficiently Active (07/23/2023)   Exercise Vital Sign    Days of Exercise per Week: 5 days    Minutes of Exercise per Session: 150+ min  Stress: No  Stress Concern Present (07/23/2023)   [employer] of Occupational Health - Occupational Stress Questionnaire    Feeling of Stress : Only a little  Social Connections: Moderately Integrated (07/23/2023)   Social Connection and Isolation Panel [NHANES]    Frequency of Communication with Friends and Family: More than three times a week    Frequency of Social Gatherings with Friends and Family: Once a week    Attends Religious Services: Never    Database administrator or Organizations: Yes    Attends Banker  Meetings: 1 to 4 times per year    Marital Status: Married   Past Surgical History:  Procedure Laterality Date   BASAL CELL CARCINOMA EXCISION  1995   BRAIN SURGERY  12/29/2022   CRANIOTOMY FOR TUMOR Right 02/25/2011   IR ANGIO EXTERNAL CAROTID SEL EXT CAROTID BILAT MOD SED  11/16/2021   IR ANGIO INTRA EXTRACRAN SEL COM CAROTID INNOMINATE UNI R MOD SED  11/16/2021   IR ANGIO INTRA EXTRACRAN SEL INTERNAL CAROTID UNI L MOD SED  11/16/2021   IR ANGIO VERTEBRAL SEL VERTEBRAL UNI R MOD SED  11/16/2021   IR ANGIOGRAM FOLLOW UP STUDY  11/17/2021   IR NEURO EACH ADD'L AFTER BASIC UNI LEFT (MS)  11/17/2021   IR NEURO EACH ADD'L AFTER BASIC UNI RIGHT (MS)  11/17/2021   IR TRANSCATH/EMBOLIZ  11/16/2021   IR US GUIDE VASC ACCESS RIGHT  11/16/2021   RADIOLOGY WITH ANESTHESIA N/A 11/16/2021   Procedure: IR WITH ANESTHESIA;  Surgeon: Baldemar Lenis, MD;  Location: [facility] OR;  Service: Radiology;  Laterality: N/A;   TOTAL HIP ARTHROPLASTY Right 12/21/2021   Procedure: RIGHT TOTAL HIP REPLACEMENT;  Surgeon: Tarry Kos, MD;  Location: [facility] OR;  Service: Orthopedics;  Laterality: Right;  3-C   WISDOM TOOTH EXTRACTION     Past Surgical History:  Procedure Laterality Date   BASAL CELL CARCINOMA EXCISION  1995   BRAIN SURGERY  12/29/2022   CRANIOTOMY FOR TUMOR Right 02/25/2011   IR ANGIO EXTERNAL CAROTID SEL EXT CAROTID BILAT MOD SED  11/16/2021   IR ANGIO INTRA  EXTRACRAN SEL COM CAROTID INNOMINATE UNI R MOD SED  11/16/2021   IR ANGIO INTRA EXTRACRAN SEL INTERNAL CAROTID UNI L MOD SED  11/16/2021   IR ANGIO VERTEBRAL SEL VERTEBRAL UNI R MOD SED  11/16/2021   IR ANGIOGRAM FOLLOW UP STUDY  11/17/2021   IR NEURO EACH ADD'L AFTER BASIC UNI LEFT (MS)  11/17/2021   IR NEURO EACH ADD'L AFTER BASIC UNI RIGHT (MS)  11/17/2021   IR TRANSCATH/EMBOLIZ  11/16/2021   IR US GUIDE VASC ACCESS RIGHT  11/16/2021   RADIOLOGY WITH ANESTHESIA N/A 11/16/2021   Procedure: IR WITH ANESTHESIA;  Surgeon: Baldemar Lenis, MD;  Location: [facility] OR;  Service: Radiology;  Laterality: N/A;   TOTAL HIP ARTHROPLASTY Right 12/21/2021   Procedure: RIGHT TOTAL HIP REPLACEMENT;  Surgeon: Tarry Kos, MD;  Location: [facility] OR;  Service: Orthopedics;  Laterality: Right;  3-C   WISDOM TOOTH EXTRACTION     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   Depression    Epistaxis    Headache    Hypertension    PTSD (post-traumatic stress disorder)    Seizures (HCC)    BP 112/76   Pulse 72   Ht 6\' 1"  (1.854 m)   Wt 182 lb (82.6 kg)   SpO2 98%   BMI 24.01 kg/m   Opioid Risk Score:   Fall Risk Score:  `1  Depression screen [facility] 2/9     02/15/2023   11:52 AM 02/28/2022    3:04 PM 10/12/2021   11:04 AM 03/18/2021   11:24 AM 02/18/2021    2:24 PM 06/02/2020    9:27 AM  Depression screen PHQ 2/9  Decreased Interest 1 1 1 1 2  0  Down, Depressed, Hopeless 1 1 1 1 1  0  PHQ -  2 Score 2 2 2 2 3  0  Altered sleeping 1 3 1  0 0 0  Tired, decreased energy 1 1 1  0 3 0  Change in appetite 0 1 0 0 0 0  Feeling bad or failure about yourself  1 1 0 1 2 0  Trouble concentrating 2 2 0 0 1 0  Moving slowly or fidgety/restless 2 1 0 2 2 0  Suicidal thoughts 0 0 0 0 1 0  PHQ-9 Score 9 11 4 5 12  0  Difficult doing work/chores   Somewhat difficult Not difficult at all Somewhat difficult       Review of Systems  Musculoskeletal:        Left hand pain  All other systems reviewed and are negative.      Objective:   Physical Exam   PE: Constitution: Appropriate appearance for age. No apparent distress. Resp: No respiratory distress. No accessory muscle usage. on RA and CTAB Cardio:  Trace LUE peripheral edema, with erythema throughout, delayed capillary refill. RUE WNL.  Abdomen: Nondistended. Nontender.   Psych: Appropriate mood and affect. Neuro: AAOx4. No apparent cognitive deficits. CN 2-12 intact.    Neurologic Exam:   Babinsky: flexor responses b/l.   Hoffmans: negative b/l Sensory exam:   L sensory deficit to mid-elbow from distal fingertips - improving gradually   Motor exam: strength 5/5 throughout bilateral lower extremities, right upper extremity, and with exception of LUE strength 5-/5 grip, 5-/5 FA.  - much improved form last visist MAS 1 L DIP finger flexors  Coordination: Fine motor coordination was slightly reduced on L compared to R. No apparent FTN ataxia.  Improving.  No drift Gait: normal      Assessment & Plan:  Shawn York is a 48 y.o. year old male  who  has a past medical history of Anemia, Avascular necrosis (HCC), Brain cancer (HCC) (02/25/2011), Depression, Epistaxis, Headache, Hypertension, PTSD (post-traumatic stress disorder), and Seizures (HCC).    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 Lavonna Monarch at [facility], complicated by R MCA bleeding with L hemiparesis/hemisensory loss mostly isolated to LUE.  Left hemiparesis (HCC) Neuropathic pain of left hand Hemisensory deficit  Start gabapentin 100 mg three times daily; 2 weeks from now message me through mychart to let me know how it is going.  Letter for disability will be completed this week; I will message you for pickup when it is  ready  Spasticity Stop baclofen; failed due to lethargy, and tone is now minimal MAS 1 in L DIPs   Oligodendroglioma (HCC) Cognitive deficits -     Ambulatory referral to Neuropsychology  I am referring you to Dr. Kieth Brightly for neuropsychology.  Continue Adderall per neurology.   Other orders -     Gabapentin; Take 1 capsule (100 mg total) by mouth 3 (three) times daily.  Dispense: 90 capsule; Refill: 5

## 2023-09-26 ENCOUNTER — Encounter: Payer: Self-pay | Admitting: Physical Medicine and Rehabilitation

## 2023-10-11 DIAGNOSIS — C712 Malignant neoplasm of temporal lobe: Secondary | ICD-10-CM | POA: Diagnosis not present

## 2023-10-15 ENCOUNTER — Other Ambulatory Visit: Payer: Self-pay | Admitting: Internal Medicine

## 2023-10-19 ENCOUNTER — Other Ambulatory Visit: Payer: Self-pay | Admitting: Internal Medicine

## 2023-10-19 DIAGNOSIS — I10 Essential (primary) hypertension: Secondary | ICD-10-CM

## 2023-10-22 ENCOUNTER — Other Ambulatory Visit: Payer: Self-pay | Admitting: Internal Medicine

## 2023-10-26 ENCOUNTER — Encounter: Payer: BC Managed Care – PPO | Attending: Physical Medicine and Rehabilitation | Admitting: Psychology

## 2023-10-26 DIAGNOSIS — R29818 Other symptoms and signs involving the nervous system: Secondary | ICD-10-CM | POA: Diagnosis not present

## 2023-10-26 DIAGNOSIS — R252 Cramp and spasm: Secondary | ICD-10-CM | POA: Insufficient documentation

## 2023-10-26 DIAGNOSIS — G8194 Hemiplegia, unspecified affecting left nondominant side: Secondary | ICD-10-CM | POA: Diagnosis not present

## 2023-10-26 DIAGNOSIS — I63431 Cerebral infarction due to embolism of right posterior cerebral artery: Secondary | ICD-10-CM

## 2023-10-26 DIAGNOSIS — F4310 Post-traumatic stress disorder, unspecified: Secondary | ICD-10-CM

## 2023-10-26 DIAGNOSIS — M792 Neuralgia and neuritis, unspecified: Secondary | ICD-10-CM | POA: Insufficient documentation

## 2023-10-26 DIAGNOSIS — C719 Malignant neoplasm of brain, unspecified: Secondary | ICD-10-CM

## 2023-10-26 DIAGNOSIS — R4189 Other symptoms and signs involving cognitive functions and awareness: Secondary | ICD-10-CM | POA: Diagnosis not present

## 2023-11-07 NOTE — Therapy (Signed)
 [place] [facility] [address] [place], [place], [address] Phone: [phone]   Fax:  [phone]  Patient Details  Name: Shawn York MRN: [id number] Date of Birth: 01-06-1976 Referring Provider:  Elijah Birk, DO  Encounter Date: 11/07/2023  SPEECH THERAPY DISCHARGE SUMMARY  Visits from Start of Care: 14  Current functional level related to goals / functional outcomes: At date of pt's last visit (03/05/23) pt demonstrated decr'd emergent and anticipatory awareness, and decr'd attention which affected pt's safety awareness, memory, and executive function skills. Plan was for pt was to discuss with wife whether they thought pt should cont with ST or d/c due to number of medical appointments. SLP did not get a response and assumes pt no longer desires ST. Goals and impression from last session follow: SHORT TERM GOALS: Target date: 03/03/23   Pt will demo alternating attention with min-mod complex tasks involving language with compensations 90%, in 3 sessions Baseline:02/27/23 Goal status: NOT MET   2.  Pt/family will report pt completes household tasks involving language for 30 consecutive minutes with compensations if necessary, in 3sessions Baseline:  Goal status: NOT MET   3.  Pt will demo use of cognitive (memory, planning/organization, attention, awareness) compensations when performing detailed language tasks in order to achieve 90% success, in 3 sessions Baseline:  Goal status: NOT MET  4.  Pt will demo compensations with POs for reported labial leakage due to facial numbness on lt (including upper/lower lips)             Baseline:             Goal Status: Deferred - Resolved     LONG TERM GOALS: Target date: 03/31/23   Pt will demo alternating attention with min-mod complex work-like tasks involving language (using interruptions) with compensations 100%, in 3 sessions Baseline:  Goal status: REVISED   2.  Pt/family will  report pt completes household tasks involving language for 30 minutes with compensations if necessary, in 3 sessions Baseline:  Goal status: REVISED   3.  Pt will demo knowledge of need to employ compensations when performing detailed language tasks in order to achieve 100% success, in 3 sessions Baseline:  Goal status: REVISED   4.  Pt will tell SLP 3 compensations he may need to use to improve accuracy with recall and/or attention Baseline:  Goal status: REVISED   5.  Pt will score higher on cognitive PROM in the last 1-2 sessions than on initial administration             Baseline:             Goal Status: IN PROGRESS   6. Pt will perform HEP for motor speech with modified independence in 3 sessions             Baseline:             Goal status: INITIAL   ASSESSMENT:   CLINICAL IMPRESSION: He was provided oral motor list for velar consonants today (/k/, /g/), but wrote out his sentences instead of saying them. See "today's treatment" for more details of today's session. Patient is a 48 y.o. male who was seen today therapy targeting planning, organization, and attention. Pt's motor-speech will also cont to be targeted intermittently in ST - SLP added LTG for HEP for motor speech. Pt's cognition as it stands currently would be detrimental to his job security and pt would benefit from skilled ST to  improve these skills. Pt is driving to/from appointments - SLP would not recommend pt drive due to presentation, cognitively, in ST visits.    Remaining deficits: As above, cognitive communication deficits are present.    Education / Equipment: See Therapy dates for details.    Patient agrees to discharge. Patient goals were not met. Patient is being discharged due to  not returning since last session.Marland Kitchen    Chukwuma Straus, CCC-SLP 11/07/2023, 10:21 AM  [place] [facility] [address] [place], [place], [address] Phone: [phone]    Fax:  [phone]

## 2023-12-13 DIAGNOSIS — C712 Malignant neoplasm of temporal lobe: Secondary | ICD-10-CM | POA: Diagnosis not present

## 2023-12-21 ENCOUNTER — Other Ambulatory Visit: Payer: Self-pay | Admitting: Physical Medicine and Rehabilitation

## 2023-12-25 ENCOUNTER — Encounter: Payer: Self-pay | Admitting: Physical Medicine and Rehabilitation

## 2023-12-25 ENCOUNTER — Encounter
Payer: BC Managed Care – PPO | Attending: Physical Medicine and Rehabilitation | Admitting: Physical Medicine and Rehabilitation

## 2023-12-25 VITALS — BP 112/84 | HR 77 | Ht 73.0 in | Wt 173.0 lb

## 2023-12-25 DIAGNOSIS — R252 Cramp and spasm: Secondary | ICD-10-CM | POA: Diagnosis not present

## 2023-12-25 DIAGNOSIS — I69014 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: Secondary | ICD-10-CM | POA: Insufficient documentation

## 2023-12-25 DIAGNOSIS — G8194 Hemiplegia, unspecified affecting left nondominant side: Secondary | ICD-10-CM | POA: Insufficient documentation

## 2023-12-25 DIAGNOSIS — C719 Malignant neoplasm of brain, unspecified: Secondary | ICD-10-CM

## 2023-12-25 DIAGNOSIS — M792 Neuralgia and neuritis, unspecified: Secondary | ICD-10-CM | POA: Insufficient documentation

## 2023-12-25 MED ORDER — GABAPENTIN 300 MG PO CAPS: 300.0000 mg | ORAL_CAPSULE | Freq: Three times a day (TID) | ORAL | 5 refills | Status: DC

## 2023-12-25 NOTE — Progress Notes (Signed)
 Subjective:    Patient ID: Cyle Hafley, male    DOB: 27-Nov-1975, 48 y.o.   MRN: [id number]  HPI  Kouta Antczak is a 48 y.o. year old male  who  has a past medical history of Anemia, Avascular necrosis (HCC), Brain cancer (HCC) (02/25/2011), Depression, Epistaxis, Headache, Hypertension, PTSD (post-traumatic stress disorder), and Seizures (HCC).   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 Sedalia Dacosta at [facility], complicated by R MCA bleeding with L hemiparesis/hemisensory loss mostly isolated to LUE.    Plan from last visit: Left hemiparesis (HCC) Neuropathic pain of left hand Hemisensory deficit   Start gabapentin  100 mg three times daily; 2 weeks from now message me through mychart to let me know how it is going.   Letter for disability will be completed this week; I will message you for pickup when it is ready   Spasticity Stop baclofen ; failed due to lethargy, and tone is now minimal MAS 1 in L DIPs     Oligodendroglioma (HCC) Cognitive deficits -     Ambulatory referral to Neuropsychology   I am referring you to Dr. Cheryll Corti for neuropsychology.  Continue Adderall per neurology.    Interval Hx:  - Therapies: He continues to work; he wants to get into recreational exercises but is having problems with scheduling. "I feel like my hand is week and I want to get some dedicated." He is a member at [employer] and [employer], has never looked into personal training.    - Follow ups: Saw Oncology 4/16 for cycle 10 temodar now released to [facility] - last MRI 2/12 stable, pending repeat.    - Falls: none   - DME: none   - Medications: Gabapentin  100 mg tid has not made a difference in his neuropathy. He says sensation feels ticklish, hypersensitive to touch. He says it is not too painful but is nagging; "it feels like my hand is packed in  crushed ice like a fish. First thing in the morning is the worst.   Since stopping baclofen , he has not noticed a    - Other concerns: He says "in regards to OT I think there's a difference between recovery and ability." He was making discrete gains in OT but was disappointed he did not get complete functional recovery.   Friday is the anniversary of his surgery; he says in the hospital he was told not to consider disability.   Pain Inventory Average Pain 5 Pain Right Now 5 My pain is constant, aching, and feel like its packed in ice,numb  In the last 24 hours, has pain interfered with the following? General activity 5 Relation with others 5 Enjoyment of life 5 What TIME of day is your pain at its worst? morning , daytime, evening, night, and varies Sleep (in general) Poor  Pain is worse with: inactivity Pain improves with: therapy/exercise and heat Relief from Meds: 0  Family History  Problem Relation Age of Onset   Cancer Other        brain cancer   Cancer Father        skin cancer   Cancer Maternal Grandmother 76       brain cancer   Alcohol  abuse Brother    Alzheimer's disease Paternal Grandmother    Dementia Paternal Grandmother    Heart disease Paternal Grandfather    Social History   Socioeconomic History  Marital status: Married    Spouse name: Not on file   Number of children: Not on file   Years of education: Not on file   Highest education level: Some college, no degree  Occupational History   Not on file  Tobacco Use   Smoking status: Former    Current packs/day: 0.00    Types: Cigarettes    Quit date: 02/25/2011    Years since quitting: 12.8   Smokeless tobacco: Never  Vaping Use   Vaping status: Never Used  Substance and Sexual Activity   Alcohol  use: Not Currently    Alcohol /week: 14.0 standard drinks of alcohol     Types: 14 Cans of beer per week    Comment: quit 11-08-2021   Drug use: No   Sexual activity: Yes    Comment: E-cigarette users   Other Topics Concern   Not on file  Social History Narrative   Not on file   Social Drivers of Health   Financial Resource Strain: Low Risk  (07/23/2023)   Overall Financial Resource Strain (CARDIA)    Difficulty of Paying Living Expenses: Not hard at all  Food Insecurity: No Food Insecurity (07/23/2023)   Hunger Vital Sign    Worried About Running Out of Food in the Last Year: Never true    Ran Out of Food in the Last Year: Never true  Transportation Needs: No Transportation Needs (07/23/2023)   PRAPARE - Administrator, Civil Service (Medical): No    Lack of Transportation (Non-Medical): No  Physical Activity: Sufficiently Active (07/23/2023)   Exercise Vital Sign    Days of Exercise per Week: 5 days    Minutes of Exercise per Session: 150+ min  Stress: No Stress Concern Present (07/23/2023)   [employer] of Occupational Health - Occupational Stress Questionnaire    Feeling of Stress : Only a little  Social Connections: Moderately Integrated (07/23/2023)   Social Connection and Isolation Panel [NHANES]    Frequency of Communication with Friends and Family: More than three times a week    Frequency of Social Gatherings with Friends and Family: Once a week    Attends Religious Services: Never    Database administrator or Organizations: Yes    Attends Engineer, structural: 1 to 4 times per year    Marital Status: Married   Past Surgical History:  Procedure Laterality Date   BASAL CELL CARCINOMA EXCISION  1995   BRAIN SURGERY  12/29/2022   CRANIOTOMY FOR TUMOR Right 02/25/2011   IR ANGIO EXTERNAL CAROTID SEL EXT CAROTID BILAT MOD SED  11/16/2021   IR ANGIO INTRA EXTRACRAN SEL COM CAROTID INNOMINATE UNI R MOD SED  11/16/2021   IR ANGIO INTRA EXTRACRAN SEL INTERNAL CAROTID UNI L MOD SED  11/16/2021   IR ANGIO VERTEBRAL SEL VERTEBRAL UNI R MOD SED  11/16/2021   IR ANGIOGRAM FOLLOW UP STUDY  11/17/2021   IR NEURO EACH ADD'L AFTER BASIC UNI LEFT (MS)   11/17/2021   IR NEURO EACH ADD'L AFTER BASIC UNI RIGHT (MS)  11/17/2021   IR TRANSCATH/EMBOLIZ  11/16/2021   IR US  GUIDE VASC ACCESS RIGHT  11/16/2021   RADIOLOGY WITH ANESTHESIA N/A 11/16/2021   Procedure: IR WITH ANESTHESIA;  Surgeon: de Macedo Rodrigues, Katyucia, MD;  Location: [facility] OR;  Service: Radiology;  Laterality: N/A;   TOTAL HIP ARTHROPLASTY Right 12/21/2021   Procedure: RIGHT TOTAL HIP REPLACEMENT;  Surgeon: Wes Hamman, MD;  Location: [facility] OR;  Service: Orthopedics;  Laterality: Right;  3-C   WISDOM TOOTH EXTRACTION     Past Surgical History:  Procedure Laterality Date   BASAL CELL CARCINOMA EXCISION  1995   BRAIN SURGERY  12/29/2022   CRANIOTOMY FOR TUMOR Right 02/25/2011   IR ANGIO EXTERNAL CAROTID SEL EXT CAROTID BILAT MOD SED  11/16/2021   IR ANGIO INTRA EXTRACRAN SEL COM CAROTID INNOMINATE UNI R MOD SED  11/16/2021   IR ANGIO INTRA EXTRACRAN SEL INTERNAL CAROTID UNI L MOD SED  11/16/2021   IR ANGIO VERTEBRAL SEL VERTEBRAL UNI R MOD SED  11/16/2021   IR ANGIOGRAM FOLLOW UP STUDY  11/17/2021   IR NEURO EACH ADD'L AFTER BASIC UNI LEFT (MS)  11/17/2021   IR NEURO EACH ADD'L AFTER BASIC UNI RIGHT (MS)  11/17/2021   IR TRANSCATH/EMBOLIZ  11/16/2021   IR US  GUIDE VASC ACCESS RIGHT  11/16/2021   RADIOLOGY WITH ANESTHESIA N/A 11/16/2021   Procedure: IR WITH ANESTHESIA;  Surgeon: de Macedo Rodrigues, Katyucia, MD;  Location[facility] OR;  Service: Radiology;  Laterality: N/A;   TOTAL HIP ARTHROPLASTY Right 12/21/2021   Procedure: RIGHT TOTAL HIP REPLACEMENT;  Surgeon: Wes Hamman, MD;  Location: [facility] OR;  Service: Orthopedics;  Laterality: Right;  3-C   WISDOM TOOTH EXTRACTION     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   Depression    Epistaxis    Headache    Hypertension    PTSD (post-traumatic stress disorder)    Seizures (HCC)    Wt 173 lb (78.5 kg)   BMI 22.82  kg/m   Opioid Risk Score:   Fall Risk Score:  `1  Depression screen [facility] 2/9     12/25/2023   11:37 AM 02/15/2023   11:52 AM 02/28/2022    3:04 PM 10/12/2021   11:04 AM 03/18/2021   11:24 AM 02/18/2021    2:24 PM 06/02/2020    9:27 AM  Depression screen PHQ 2/9  Decreased Interest 1 1 1 1 1 2  0  Down, Depressed, Hopeless 1 1 1 1 1 1  0  PHQ - 2 Score 2 2 2 2 2 3  0  Altered sleeping  1 3 1  0 0 0  Tired, decreased energy  1 1 1  0 3 0  Change in appetite  0 1 0 0 0 0  Feeling bad or failure about yourself   1 1 0 1 2 0  Trouble concentrating  2 2 0 0 1 0  Moving slowly or fidgety/restless  2 1 0 2 2 0  Suicidal thoughts  0 0 0 0 1 0  PHQ-9 Score  9 11 4 5 12  0  Difficult doing work/chores    Somewhat difficult Not difficult at all Somewhat difficult     Review of Systems  Musculoskeletal:        Left hand pain  from wrist to finger tips   Neurological:  Positive for weakness and numbness.  All other systems reviewed and are negative.      Objective:   Physical Exam  Constitution: Appropriate appearance for age. No apparent distress. Resp: No respiratory distress. No accessory muscle usage. on RA and CTAB Cardio:  LUE delayed capillary refill. RUE WNL.  Abdomen: Nondistended. Nontender.   Psych: Appropriate mood and affect. Neuro: AAOx4. No apparent cognitive deficits. CN 2-12 intact.    Neurologic Exam:   Hoffmans:  negative b/l Sensory exam:   L sensory deficit distal fingertips    Motor exam: strength 5/5 throughout bilateral lower extremities, right upper extremity, and with exception of LUE strength 5-/5 grip, 5-/5 FA.   MAS 0 L DIP finger flexors  Coordination: Fine motor coordination was normal LUWE. No apparent FTN ataxia.  No drift. Fine motor intact Gait: normal      Assessment & Plan:   Kelen Ryals is a 48 y.o. year old male  who  has a past medical history of Anemia, Avascular necrosis (HCC), Brain cancer (HCC) (02/25/2011), Depression, Epistaxis,  Headache, Hypertension, PTSD (post-traumatic stress disorder), and Seizures (HCC).  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 Sedalia Dacosta at [facility], complicated by R MCA bleeding with L hemiparesis/hemisensory loss mostly isolated to LUE.  Neuropathic pain of left hand Increase gabapentin  from 100 mg 3 times a day to 300 mg 3 times a day for ongoing paresthesias in the left hand  Left hemiparesis (HCC) Patient is interested in pursuing higher level weight bearing activities with his left upper extremity than was done in OT; advised that he is okay to start personal training, and provided a letter of support for this with no gross restrictions.  Spasticity No appreciable tone on today's exam, do not feel that spasticity medications or injections would benefit him at this point.  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 Patient describing symptoms of "locking in" to certain activities and being unable to multitask; this has affected his ability to switch tasks at work and concentrate actively while driving.  Will talk to Dr. Albertina Hugger about adjustment of Adderall given symptoms.  I will look into  vocational rehab to see if there are options for him regarding alternate careers.  Encouraged him to start to resume classes to test his ability to concentrate and perform, starting with 1 at a time.  He wishes to continue his current employment, although we did discuss possibilities for disability today.  Other orders -     Gabapentin ; Take 1 capsule (300 mg total) by mouth 3 (three) times daily.  Dispense: 90 capsule; Refill: 5

## 2023-12-25 NOTE — Patient Instructions (Signed)
 Increase gabapentin  from 100 mg 3 times a day to 300 mg 3 times a day for ongoing paresthesias in the left hand  Patient is interested in pursuing higher level weight bearing activities with his left upper extremity than was done in OT; advised that he is okay to start personal training, and provided a letter of support for this with no gross restrictions.  Patient describing symptoms of "locking in" to certain activities and being unable to multitask; this has affected his ability to switch tasks at work and concentrate actively while driving.  Will talk to Dr. Albertina Hugger about adjustment of Adderall given symptoms.  I will look into the office of vocational rehab to see if there are options for him regarding alternate careers.  Encouraged him to start to resume classes to test his ability to concentrate and perform, starting with 1 at a time.  He wishes to continue his current employment, although we did discuss possibilities for disability today.  Follow-up in 3 months.

## 2023-12-28 ENCOUNTER — Other Ambulatory Visit: Payer: Self-pay | Admitting: Internal Medicine

## 2023-12-28 DIAGNOSIS — I10 Essential (primary) hypertension: Secondary | ICD-10-CM

## 2023-12-28 NOTE — Telephone Encounter (Signed)
 Copied from CRM [id number]. Topic: Clinical - Medication Refill >> Dec 28, 2023 12:43 PM Freya Jesus wrote: Most Recent Primary Care Visit:  Provider: Arcadio Knuckles  Department: [facility]  Visit Type: OFFICE VISIT  Date: 07/24/2023  Medication: indapamide  (LOZOL ) 1.25 MG tablet [[id number]]  Has the patient contacted their pharmacy? No (Agent: If no, request that the patient contact the pharmacy for the refill. If patient does not wish to contact the pharmacy document the reason why and proceed with request.) (Agent: If yes, when and what did the pharmacy advise?)  Is this the correct pharmacy for this prescription? Yes If no, delete pharmacy and type the correct one.  This is the patient's preferred pharmacy:  [id number] - [place], [place] - [address] AT [address] OF [facility] [address] [place]  [address] Phone: [phone] Fax: [phone]   Has the prescription been filled recently? No  Is the patient out of the medication? Yes  Has the patient been seen for an appointment in the last year OR does the patient have an upcoming appointment? Yes  Can we respond through MyChart? Yes  Agent: Please be advised that Rx refills may take up to 3 business days. We ask that you follow-up with your pharmacy.

## 2023-12-29 ENCOUNTER — Other Ambulatory Visit: Payer: Self-pay | Admitting: Internal Medicine

## 2023-12-29 DIAGNOSIS — I10 Essential (primary) hypertension: Secondary | ICD-10-CM

## 2023-12-29 MED ORDER — INDAPAMIDE 1.25 MG PO TABS
1.2500 mg | ORAL_TABLET | Freq: Every day | ORAL | 0 refills | Status: DC
Start: 2023-12-29 — End: 2024-02-28

## 2023-12-31 DIAGNOSIS — R4189 Other symptoms and signs involving cognitive functions and awareness: Secondary | ICD-10-CM | POA: Insufficient documentation

## 2023-12-31 DIAGNOSIS — M792 Neuralgia and neuritis, unspecified: Secondary | ICD-10-CM | POA: Insufficient documentation

## 2024-01-01 DIAGNOSIS — C712 Malignant neoplasm of temporal lobe: Secondary | ICD-10-CM | POA: Diagnosis not present

## 2024-01-05 ENCOUNTER — Telehealth: Payer: Self-pay | Admitting: Neurology

## 2024-01-05 MED ORDER — AMPHETAMINE-DEXTROAMPHETAMINE 10 MG PO TABS
10.0000 mg | ORAL_TABLET | ORAL | 0 refills | Status: DC | PRN
Start: 2024-01-05 — End: 2024-03-27

## 2024-01-05 MED ORDER — AMPHETAMINE-DEXTROAMPHETAMINE 20 MG PO TABS: 20.0000 mg | ORAL_TABLET | Freq: Every day | ORAL | 0 refills | Status: DC

## 2024-01-05 NOTE — Telephone Encounter (Signed)
 Increased adderall to an extended release form in AM  and additional immediate release form 10 mg after lunch po.

## 2024-01-08 NOTE — Addendum Note (Signed)
 Addended by: Karlen Barbar K on: 01/08/2024 11:07 AM   Modules accepted: Orders

## 2024-01-09 ENCOUNTER — Other Ambulatory Visit: Payer: Self-pay | Admitting: Surgery

## 2024-01-09 DIAGNOSIS — I7121 Aneurysm of the ascending aorta, without rupture: Secondary | ICD-10-CM

## 2024-01-09 MED ORDER — AMPHETAMINE-DEXTROAMPHET ER 20 MG PO CP24: 20.0000 mg | ORAL_CAPSULE | Freq: Every day | ORAL | 0 refills | Status: DC

## 2024-01-09 NOTE — Addendum Note (Signed)
 Addended by: Neomia Banner on: 01/09/2024 04:49 PM   Modules accepted: Orders

## 2024-01-10 ENCOUNTER — Encounter: Payer: Self-pay | Admitting: Internal Medicine

## 2024-01-20 ENCOUNTER — Other Ambulatory Visit: Payer: Self-pay | Admitting: Internal Medicine

## 2024-01-23 ENCOUNTER — Encounter: Attending: Physical Medicine and Rehabilitation

## 2024-01-23 DIAGNOSIS — I69014 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: Secondary | ICD-10-CM | POA: Insufficient documentation

## 2024-01-23 DIAGNOSIS — R252 Cramp and spasm: Secondary | ICD-10-CM | POA: Insufficient documentation

## 2024-01-23 DIAGNOSIS — R4189 Other symptoms and signs involving cognitive functions and awareness: Secondary | ICD-10-CM | POA: Diagnosis not present

## 2024-01-23 DIAGNOSIS — M792 Neuralgia and neuritis, unspecified: Secondary | ICD-10-CM | POA: Insufficient documentation

## 2024-01-23 DIAGNOSIS — F4310 Post-traumatic stress disorder, unspecified: Secondary | ICD-10-CM | POA: Diagnosis not present

## 2024-01-23 DIAGNOSIS — G8194 Hemiplegia, unspecified affecting left nondominant side: Secondary | ICD-10-CM | POA: Insufficient documentation

## 2024-01-23 NOTE — Progress Notes (Signed)
 Behavioral Observations:  The patient appeared well-groomed and appropriately dressed. His manners were polite and appropriate to the situation. The patient's attitude towards testing was positive and his effort was good. Diminished use of the left hand was noted during testing, especially during measures of fine motor control. The patient expressed some anxiety surrounding the testing process and presented as somewhat reserved throughout the testing session.   Neuropsychology Note  Shawn York completed 155 minutes of neuropsychological testing with technician, Shawn York, BA, under the supervision of Shawn Commodore, PsyD., Clinical Neuropsychologist. The patient did not appear overtly distressed by the testing session, per behavioral observation or via self-report to the technician. Rest breaks were offered.   Clinical Decision Making: In considering the patient's current level of functioning, level of presumed impairment, nature of symptoms, emotional and behavioral responses during clinical interview, level of literacy, and observed level of motivation/effort, a battery of tests was selected by Dr. Cheryll York during initial consultation on 10/26/2023. This was communicated to the technician. Communication between the neuropsychologist and technician was ongoing throughout the testing session and changes were made as deemed necessary based on patient performance on testing, technician observations and additional pertinent factors such as those listed above.  Tests Administered: Controlled Oral Word Association Test (COWAT; FAS & Animals)  Finger Tapping Test (FTT) Grooved Pegboard Trail Making Test (TMT; Part A & B) Wechsler Adult Intelligence Scale, 4th Edition (WAIS-IV) Wechsler Memory Scale, 4th Edition (WMS-IV); Adult Battery  Results:  COWAT:  FAS total= 21 Z= -1.84 Animals total= 13 Z= -1.83  FTT:  R (DH) Average= 54.4 Percentile Rank= 55th L (NDH) Average=  28.4 Percentile Rank= 19th  Grooved Pegboard: R(DH) time= 106s Drops= 0 Percentile Rank= 27th L (NDH) time= 300s (D/C) Drops= 15 Percentile Rank= 11th  TMT A/B:  Trails A time= 41s Errors= 0  Percentile Rank= 41st Trails B time= 85s Errors= 0  Percentile Rank= 46th  WAIS-IV:   Composite Score Summary  Scale Sum of Scaled Scores Composite Score Percentile Rank 95% Conf. Interval Qualitative Description  Verbal Comprehension 33 VCI 105 63 99-110 Average  Perceptual Reasoning 28 PRI 96 39 90-102 Average  Working Memory 16 WMI 89 23 83-96 Low Average  Processing Speed 15 PSI 86 18 79-96 Low Average  Full Scale 92 FSIQ 94 34 90-98 Average  General Ability 61 GAI 101 53 96-106 Average   Verbal Comprehension Subtests Summary  Subtest Raw Score Scaled Score Percentile Rank Reference Group Scaled Score SEM  Similarities 24 9 37 10 1.04  Vocabulary 42 11 63 12 0.73  Information 19 13 84 14 0.73  The scaled scores in the Reference Group Scaled Score column are based on the performance of examinees aged 20:0-34:11 (i.e., the reference group). See Chapter 6 of the WAIS-IV Technical and Interpretive Manual for more information.  Perceptual Reasoning Subtests Summary  Subtest Raw Score Scaled Score Percentile Rank Reference Group Scaled Score SEM  Block Design 34 9 37 8 0.95  Matrix Reasoning 14 8 25 7  0.95  Visual Puzzles 15 11 63 10 0.85   Working Librarian, academic Raw Score Scaled Score Percentile Rank Reference Group Scaled Score SEM  Digit Span 26 9 37 9 0.73  Arithmetic 11 7 16 8  0.90   Processing Speed Subtests Summary  Subtest Raw Score Scaled Score Percentile Rank Reference Group Scaled Score SEM  Symbol Search 20 6 9 5  1.56  Coding 61 9 37 8 1.20   WMS-IV: Index Score Summary  Index Sum of Scaled Scores Index Score Percentile Rank 95% Confidence Interval Qualitative Descriptor  Auditory Memory (AMI) 50 115 84 108-120 High Average  Visual Memory  (VMI) 32 87 19 82-93 Low Average  Visual Working Memory (VWMI) 17 91 27 84-99 Average  Immediate Memory (IMI) 40 100 50 94-106 Average  Delayed Memory (DMI) 42 104 61 97-111 Average      Primary Subtest Scaled Score Summary  Subtest Domain Raw Score Scaled Score Percentile Rank  Logical Memory I AM 27 11 63  Logical Memory II AM 26 12 75  Verbal Paired Associates I AM 42 13 84  Verbal Paired Associates II AM 13 14 91  Designs I VM 50 6 9  Designs II VM 50 9 37  Visual Reproduction I VM 35 10 50  Visual Reproduction II VM 15 7 16    Auditory Memory Process Score Summary  Process Score Raw Score Scaled Score Percentile Rank Cumulative Percentage (Base Rate)  LM II Recognition 24 - - 26-50%  VPA II Recognition 40 - - >75%   Visual Memory Process Score Summary  Process Score Raw Score Scaled Score Percentile Rank Cumulative Percentage (Base Rate)  DE I Content 24 4 2  -  DE I Spatial 12 6 9  -  DE II Content 35 10 50 -  DE II Spatial 11 9 37 -  DE II Recognition 14 - - 26-50%  VR II Recognition 4 - - 17-25%    ABILITY-MEMORY ANALYSIS  Ability Score:  VCI: 105 Date of Testing:  WAIS-IV; WMS-IV 2024/01/23  Predicted Difference Method   Index Predicted WMS-IV Index Score Actual WMS-IV Index Score Difference Critical Value  Significant Difference Y/N Base Rate  Auditory Memory 103 115 -12 9.43 Y 15-20%  Visual Memory 102 87 15 9.19 Y 10-15%  Visual Working Memory 103 91 12 11.15 Y 15-20%  Immediate Memory 103 100 3 10.27 N   Delayed Memory 103 104 -1 10.03 N   Statistical significance (critical value) at the .01 level.    Feedback to Patient: Shawn York will return on 02/22/2024 for an interactive feedback session with Dr. Cheryll York at which time his test performances, clinical impressions and treatment recommendations will be reviewed in detail. The patient understands he can contact our office should he require our assistance before this time.  155 minutes spent  face-to-face with patient administering standardized tests, 30 minutes spent scoring Radiographer, therapeutic). [CPT [id number], [id number]]  Full report to follow.

## 2024-01-25 ENCOUNTER — Ambulatory Visit: Admitting: Internal Medicine

## 2024-01-25 ENCOUNTER — Encounter: Payer: Self-pay | Admitting: Internal Medicine

## 2024-01-25 DIAGNOSIS — C712 Malignant neoplasm of temporal lobe: Secondary | ICD-10-CM | POA: Diagnosis not present

## 2024-01-30 ENCOUNTER — Encounter: Payer: Self-pay | Admitting: Psychology

## 2024-01-30 NOTE — Progress Notes (Signed)
 Neuropsychological Consultation   Patient:   Shawn York   DOB:   Nov 07, 1975  MR Number:  [id number]  Location:  [facility] [address] [place], [address] [place] [address] Dept: [phone]           Date of Service:   10/26/2023  Location of Service and Individuals present: Today's visit was conducted in my outpatient clinic office with the patient myself present.  Start Time:   3 PM End Time:   5 PM  Today's visit was 1 hour and 15 minutes in face-to-face clinical interview as well as 45 minutes with records review, report writing and setting up testing protocols.  Patient Consent and Confidentiality: Limits of confidentiality including the fact that his medical records from our visits would be found in his electronic medical records and formal report would be made available to his treating physicians as well as in his EMR.  Patient consents to proceed with the evaluation.  Consent for Evaluation and Treatment:  Signed:  Yes Explanation of Privacy Policies:  Signed:  Yes Discussion of Confidentiality Limits:  Yes  Provider/Observer:  Chapman Commodore, Psy.D.       Clinical Neuropsychologist       Billing Code/Service: 96116/96121  Chief Complaint:     Chief Complaint  Patient presents with   Post-Traumatic Stress Disorder   Memory Loss   Other    Executive functioning and problem-solving deficits     Reason for Service:    Shawn York is a 48 year old male referred for neuropsychological evaluation due to ongoing executive functioning changes and memory difficulties, left hemiparesis, neuropathic pain in left hand and hemisensory deficit.  Patient is being followed by both physical medicine (Dr. Dorn Gaskins) and neurology (Dr. Albertina Hugger).  Patient has a past medical history including anemia, avascular necrosis, diagnosis of brain cancer in 2012, depression, posttraumatic stress  disorder, Epistaxis, headache, hypertension, and seizures.  Patient was admitted onto the comprehensive inpatient rehabilitation unit earlier in May of this year.  Patient was status post right temporal craniotomy.    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  Patient completed standard radiation in 2013.  Patient has a significant past psychiatric history that predated the identification of this tumor.  Patient has a history of significant posttraumatic stress disorder, depression with anxiety that have been managed through psychiatry for some time.  Patient continues on his current psychotropic regimen during his hospitalization.  This includes Trileptal , Rexulti  and BuSpar .  Recent repeat MRIs have been contacted since that time and an MRI on 11/09/2022 indicated suspicion of progression and repeat MRI was done 4 weeks later with clear indication of progression.  Patient underwent right temporal craniotomy with resection of tumor on 12/29/2022 at [facility] with complications of bleeding from branch of the MCA.  Patient has persistent postoperative motor deficits on left side and left facial droop and near complete paralysis of his left hand.  Follow-up MRI did show adjacent extra-axial hemorrhage with layering blood products in the right temporal lobe resection cavity with small volume subarachnoid hemorrhage and trace cryptic tonic edema and resection cavity.  Patient was admitted to the comprehensive inpatient rehabilitation unit due to left-sided weakness with sensory changes, left inattention, headaches as well as decreased insight into deficits.   I saw the patient 1 time  while he was on the inpatient unit on 01/11/2024.  Patient's affect was rather flat with unusual presentation and patient noted significant motor deficits postsurgery  and there were significant attentional issues noted during that visit.  There were clear executive functioning deficits noted in that visit.  The patient is now being followed up post acute rehab on an outpatient basis through physical medicine and Dr. Dorn Gaskins.  He is also being followed by Dr. Albertina Hugger with [facility].  Patient's current medications include Adderall with the patient having longstanding attentional issues, BuSpar  for anxiety symptoms and Lexapro  for anxiety and depression.  Patient also continues to take Trileptal  and Neurontin .  During today's clinical visit, the patient notes continued symptoms post surgery/brain hemorrhage.  Patient feels like his problem-solving is adequate but in conversations it takes him longer to register what is being talked about once he has a little bit of time he will usually figure it out.  He does note minimal activity has changed particular around his awareness of his surroundings or whether other people are close or walking by.  Patient notes he has difficulty sensing where people are going and feels oblivious to his surroundings.  Patient notes memory issues started in 2012 after the initial surgery but have gotten worse with this most recent craniotomy and brain bleed.  The patient reports that he will have difficulties with semantic information but also notes episodic memory difficulties continue.  Patient feels like his problem-solving is okay but he does acknowledge taking longer to Register and figure what other people are saying or talking about.  Patient also notes that his left hand often is "purple" since his recent surgery and feels cold and strange feeling up to his forearm.  Patient has a history of chronic posttraumatic stress disorder that he attributes to his experiences as a child where he was "raised in a cult" and was in a very big community type setting that were all fully immersed into the cult.  The patient reports that  at age 60 that he just walked out in his bed on his own since.  Patient notes that his parents are still with the call.  The patient reports that he continues to have nightmares and flashbacks of his experiences as a child.  Medical History:   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   Depression    Epistaxis    Headache    Hypertension    PTSD (post-traumatic stress disorder)    Seizures (HCC)          Patient Active Problem List   Diagnosis Date Noted   Neuropathic pain of left hand 12/31/2023   Cognitive deficits 12/31/2023   Dyslipidemia, goal LDL below 130 07/24/2023   Gastroesophageal reflux disease with esophagitis without hemorrhage 07/24/2023   Cerebrovascular accident (CVA) due to embolism of right posterior cerebral artery (HCC) 06/21/2023   Partial symptomatic epilepsy with complex partial seizures, not intractable, without status epilepticus (HCC) 06/21/2023   Hypersomnia due to medical condition 06/21/2023   Spasticity 05/29/2023   Left hemiparesis (HCC) 02/19/2023   Hemisensory deficit 02/19/2023   Shoulder-hand syndrome 02/19/2023   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 02/08/2023   Depression with anxiety 01/11/2023   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   Coagulation defect, unspecified (HCC) 11/02/2022   Amnestic MCI (mild cognitive impairment with memory loss) 11/02/2022  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) 11/02/2022   Abnormal finding on MRI of brain 05/30/2022   Aneurysm of ascending aorta without rupture (HCC) 02/28/2022   Anemia due to acquired thiamine  deficiency 12/01/2021   Seasonal allergic rhinitis due to pollen 11/08/2021   Family history of dissecting aortic aneurysm 11/08/2021   Contrast media allergy 11/08/2021   Avascular necrosis of bone of right hip  (HCC) 11/02/2021   Avascular necrosis of bone of left hip (HCC) 11/02/2021   Primary hypertension 05/17/2021   Screening for colon cancer 05/17/2021   PTSD (post-traumatic stress disorder) 06/05/2018   Nightmares 09/27/2017   Brain tumor, glioma (HCC) 09/27/2017   Severe episode of recurrent major depressive disorder, without psychotic features (HCC)    Major depressive disorder, recurrent episode (HCC) 08/17/2016   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    Sleep: Patient notes that he feels like he gets enough sleep but does not always feel rested.  Patient had a sleep study conducted through Dr. Albertina Hugger that did not identify obstructive sleep apnea or central apnea.  Behavioral Observation/Mental Status:   Thorn Demas  presents as a 48 y.o.-year-old Right handed Caucasian Male who appeared his stated age. his dress was Appropriate and he was Well Groomed and his manners were Appropriate to the situation.  his participation was indicative of Inattentive and Redirectable behaviors.  There were physical disabilities noted.  he displayed an appropriate level of cooperation and motivation.    Interactions:    Active Redirectable  Attention:   abnormal and attention span appeared shorter than expected for age  Memory:   abnormal; remote memory intact, recent memory impaired  Visuo-spatial:   not examined  Speech (Volume):  normal  Speech:   normal; normal  Thought Process:  Coherent and Relevant  Coherent and Linear  Though Content:  WNL; not suicidal and not homicidal  Orientation:   person, place, time/date, and situation  Judgment:   Fair  Planning:   Fair  Affect:    Appropriate  Mood:    Anxious  Insight:   Good  Intelligence:   normal  Marital Status/Living:  Patient continues to live on his own and is doing all of his ADLs and IADLs.  The patient is driving but notes he is always taking particular care when driving particularly with changes  in his awareness of his surroundings.  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  Work History:   Patient reports that he has done a lot of different types of work.  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.  Psychiatric History:  Patient with significant prior psychiatric history even before his 2012 surgery.  Patient has a history of chronic posttraumatic stress disorder from childhood experiences, significant depression and anxiety and attentional issues.  Current medications include Adderall for attentional difficulties, BuSpar  for anxiety and Lexapro  for anxiety and depression as well as Trileptal  and Neurontin .  Abuse/Trauma History: While patient has not gone into any significant or in-depth detail he describes being raised  in a cult and has flashbacks and nightmares from his childhood.  Patient left on his own at 49 years of age.  History of Substance Use or Abuse:  No concerns of substance abuse are reported.  Family Med/Psych History:  Family History  Problem Relation Age of Onset   Cancer Other        brain cancer   Cancer Father        skin cancer   Cancer Maternal Grandmother 49       brain cancer   Alcohol  abuse Brother    Alzheimer's disease Paternal Grandmother    Dementia Paternal Grandmother    Heart disease Paternal Grandfather     Impression/DX:   Khaleb Broz is a 48 year old male referred for neuropsychological evaluation due to ongoing executive functioning changes and memory difficulties, left hemiparesis, neuropathic pain in left hand and hemisensory  deficit.  Patient is being followed by both physical medicine (Dr. Dorn Gaskins) and neurology (Dr. Albertina Hugger).  Patient has a past medical history including anemia, avascular necrosis, diagnosis of brain cancer in 2012, depression, posttraumatic stress disorder, Epistaxis, headache, hypertension, and seizures.  Patient was admitted onto the comprehensive inpatient rehabilitation unit earlier in May of this year.  Patient was status post right temporal craniotomy.    Disposition/Plan:  We have set the patient up for formal neuropsychological evaluation and we will start out with a foundational battery of the Wechsler Adult Intelligence Scale, Wechsler Memory Scale, the tactile performance test and the finger tapping Test/grooved pegboard test.  Once these are completed a determination of be made as to the need for any further evaluations.  Diagnosis:    Cognitive deficits  Left hemiparesis (HCC)  Hemisensory deficit  PTSD (post-traumatic stress disorder)  Cerebrovascular accident (CVA) due to embolism of right posterior cerebral artery (HCC)  Oligodendroglioma (HCC)        Note: This document was prepared using Dragon voice recognition software and may include unintentional dictation errors.   Electronically Signed   _______________________ Chapman Commodore, Psy.D. Clinical Neuropsychologist

## 2024-02-07 NOTE — Progress Notes (Deleted)
 [address]       [place] [address]             [phone]        Brandi Armato [id number] 01/26/1976  History of Present Illness: Mr. Hafley is a 48 year old male with a past medical history of hypertension, seizures, anemia related to thiamine  deficiency, former tobacco abuse, and glioma of the brain s/p resection and radiation therapy in 2012 with recurrence in 2024 who underwent a brain biopsy at [facility] in May 2024 complicated by a stroke with residual left upper extremity weakness. He was found to have a 4.0x4.1cm ascending aortic aneurysm on CTA in 2023 that he requested after his brother died of aortic dissection. He has been seen in our clinic since 2023 and his aneurysm has remained stable measuring about 4.1cm.   He reports his brother died of an aortic dissection but denies other family members with aortic aneurysms. He denies personal history of connective tissue disorder.   Today he reports ***    Current Outpatient Medications on File Prior to Visit  Medication Sig Dispense Refill   amphetamine -dextroamphetamine  (ADDERALL XR) 20 MG 24 hr capsule Take 1 capsule (20 mg total) by mouth daily. Before breakfast po. 30 capsule 0   amphetamine -dextroamphetamine  (ADDERALL) 10 MG tablet Take 1 tablet (10 mg total) by mouth as needed. Please use this medication no later than 2 pm as needed for alertness and focus. 30 tablet 0   b complex vitamins capsule Take 1 capsule by mouth daily.     baclofen  (LIORESAL ) 10 MG tablet TAKE 1 TABLET BY MOUTH AT BEDTIME AS NEEDED FOR MUSCLE SPASMS 30 tablet 5   busPIRone  (BUSPAR ) 30 MG tablet Take 1 tablet (30 mg total) by mouth 2 (two) times daily. 180 tablet 1   escitalopram  (LEXAPRO ) 10 MG tablet Take 1 tablet (10 mg total) by mouth daily. 90 tablet 0   gabapentin  (NEURONTIN ) 300 MG capsule Take 1 capsule (300 mg total) by mouth 3 (three) times daily. 90 capsule 5   indapamide  (LOZOL ) 1.25 MG tablet Take 1 tablet (1.25 mg  total) by mouth daily. 90 tablet 0   levocetirizine (XYZAL ) 5 MG tablet TAKE 1 TABLET BY MOUTH EVERY DAY IN THE EVENING 90 tablet 0   ondansetron  (ZOFRAN ) 8 MG tablet Take by mouth.     Oxcarbazepine  (TRILEPTAL ) 300 MG tablet Take 1 tablet (300 mg total) by mouth 2 (two) times daily. 60 tablet 0   temozolomide (TEMODAR) 100 MG capsule Take 100 mg by mouth daily. 5 days on 28 days off     temozolomide (TEMODAR) 250 MG capsule Take 250 mg by mouth daily. 5 days on and 28 days off     No current facility-administered medications on file prior to visit.    Vitals:  Physical Exam  CTA Results:     Impression and Plan: ATAA: Mr. Arpino presents to the clinic with a *** cm ascending aortic aneurysm.  Echocardiogram shows a tricuspid aortic valve without evidence of regurgitation  We discussed the natural history and and risk factors for growth of ascending aortic aneurysms.  We covered the importance of smoking cessation, tight blood pressure control, refraining from lifting heavy objects, and avoiding fluoroquinolones.  The patient is aware of signs and symptoms of aortic dissection and when to present to the emergency department.  We will continue surveillance and a repeat ** was ordered for ***.     Risk  Modification:  Statin:  ***  Smoking cessation instruction/counseling given:  {[facility]L AMB [facility] SMOKING CESSATION COUNSELING:20758}  Patient was counseled on importance of Blood Pressure Control.  Despite Medical intervention if the patient notices persistently elevated blood pressure readings.  They are instructed to contact their Primary Care Physician  Please avoid use of Fluoroquinolones as this can potentially increase your risk of Aortic Rupture and/or Dissection  Patient educated on signs and symptoms of Aortic Dissection, handout also provided in AVS  Randa Burton, PA-C 02/07/24

## 2024-02-07 NOTE — Patient Instructions (Incomplete)

## 2024-02-13 ENCOUNTER — Encounter: Attending: Physical Medicine and Rehabilitation | Admitting: Psychology

## 2024-02-13 ENCOUNTER — Encounter: Payer: Self-pay | Admitting: Psychology

## 2024-02-13 DIAGNOSIS — F515 Nightmare disorder: Secondary | ICD-10-CM | POA: Insufficient documentation

## 2024-02-13 DIAGNOSIS — C712 Malignant neoplasm of temporal lobe: Secondary | ICD-10-CM | POA: Insufficient documentation

## 2024-02-13 DIAGNOSIS — R4189 Other symptoms and signs involving cognitive functions and awareness: Secondary | ICD-10-CM | POA: Diagnosis not present

## 2024-02-13 DIAGNOSIS — G8194 Hemiplegia, unspecified affecting left nondominant side: Secondary | ICD-10-CM | POA: Insufficient documentation

## 2024-02-13 DIAGNOSIS — I6389 Other cerebral infarction: Secondary | ICD-10-CM | POA: Insufficient documentation

## 2024-02-13 DIAGNOSIS — F4310 Post-traumatic stress disorder, unspecified: Secondary | ICD-10-CM | POA: Insufficient documentation

## 2024-02-13 NOTE — Progress Notes (Signed)
 Neuropsychological Evaluation   Patient:  Shawn York   DOB: Jul 12, 1976  MR Number: [id number]  Location: [facility] [address] [place], [address] [place] [address] Dept: [phone]  Start: 9 AM End: 10 AM  Provider/Observer:     Marrion Sjogren PsyD  Chief Complaint:      Chief Complaint  Patient presents with   Post-Traumatic Stress Disorder   Memory Loss   Other    Executive functioning and problem-solving deficits    Reason For Service:       Patient Information:  Shawn York  Age/Gender: 48 year old male Referral Reason: Neuropsychological evaluation due to ongoing executive functioning changes and memory difficulties, left hemiparesis, neuropathic pain in the left hand, and hemisensory deficit. Current Care Providers: Physical medicine (Dr. Dorn Gaskins) and neurology (Dr. Albertina Hugger).  Medical History:  Past Medical History: Anemia Avascular necrosis Brain cancer diagnosed in 2012 Depression Posttraumatic stress disorder (PTSD) Epistaxis Headache Hypertension Seizures  Recent Hospitalization: Admitted to the comprehensive inpatient rehabilitation unit in May 2025. Status post right temporal craniotomy.  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 Completed standard radiation in 2013. Significant psychiatric history predating the tumor diagnosis, including PTSD and depression with anxiety, managed through psychiatry. Current psychotropic regimen includes Trileptal , Rexulti , and BuSpar .  Recent Imaging and Surgery: MRI on 11/09/2022 indicated suspicion of progression; repeat MRI 4 weeks later confirmed progression. Underwent right temporal craniotomy with tumor resection on 12/29/2022 at [facility], with complications of  bleeding from a branch of the MCA. Persistent postoperative motor deficits on the left side, left facial droop, and near-complete paralysis of the left hand. Follow-up MRI showed adjacent extra-axial hemorrhage, small volume subarachnoid hemorrhage, and trace cryptic tonic edema in the resection cavity.  Current Rehabilitation: Admitted to the comprehensive inpatient rehabilitation unit due to left-sided weakness, sensory changes, left inattention, headaches, and decreased insight into deficits.  Clinical Observations:  Inpatient Visit (01/11/2024): Patient's affect was flat with unusual presentation. Significant motor deficits and attentional issues noted. Clear executive functioning deficits observed.  Current Follow-Up: Followed up post-acute rehab on an outpatient basis through physical medicine (Dr. Dorn Gaskins) and neurology (Dr. Albertina Hugger). Current medications include Adderall (for longstanding attentional issues), BuSpar  (for anxiety symptoms), Lexapro  (for anxiety and depression), Trileptal , and Neurontin .  Current Symptoms and Patient Report:  During Clinical Visit (Today): Continued symptoms post-surgery/brain hemorrhage. Adequate problem-solving but delayed registration in conversations. Minimal change in awareness of surroundings; difficulty sensing where people are going. Memory issues worsened since the recent craniotomy and brain bleed. Difficulties with both semantic and episodic memory. Left hand often appears purple and feels cold and strange up to the forearm.  Psychiatric History:  Chronic PTSD: Attributed to childhood experiences in a cult. Left the cult at age 11; parents remain in the cult. Continues to have nightmares and flashbacks of childhood experiences.     Medical History:                         Past Medical History:  Diagnosis Date   Anemia     Avascular necrosis (HCC)     Brain cancer (HCC) 02/25/2011    grade III anaplastic ologodendrglioma    Depression     Epistaxis     Headache     Hypertension     PTSD (post-traumatic stress  disorder)     Seizures (HCC)                                                                 Patient Active Problem List    Diagnosis Date Noted   Neuropathic pain of left hand 12/31/2023   Cognitive deficits 12/31/2023   Dyslipidemia, goal LDL below 130 07/24/2023   Gastroesophageal reflux disease with esophagitis without hemorrhage 07/24/2023   Cerebrovascular accident (CVA) due to embolism of right posterior cerebral artery (HCC) 06/21/2023   Partial symptomatic epilepsy with complex partial seizures, not intractable, without status epilepticus (HCC) 06/21/2023   Hypersomnia due to medical condition 06/21/2023   Spasticity 05/29/2023   Left hemiparesis (HCC) 02/19/2023   Hemisensory deficit 02/19/2023   Shoulder-hand syndrome 02/19/2023   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 02/08/2023   Depression with anxiety 01/11/2023   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   Coagulation defect, unspecified (HCC) 11/02/2022   Amnestic MCI (mild cognitive impairment with memory loss) 11/02/2022   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) 11/02/2022   Abnormal finding on MRI of brain 05/30/2022   Aneurysm of ascending aorta without rupture (HCC) 02/28/2022   Anemia due to acquired thiamine  deficiency 12/01/2021   Seasonal allergic rhinitis due to pollen 11/08/2021   Family history of dissecting aortic aneurysm 11/08/2021   Contrast media allergy 11/08/2021   Avascular necrosis of bone of right hip (HCC) 11/02/2021   Avascular necrosis of bone of left hip (HCC) 11/02/2021   Primary hypertension 05/17/2021   Screening for colon cancer 05/17/2021   PTSD (post-traumatic stress disorder) 06/05/2018   Nightmares 09/27/2017   Brain tumor, glioma (HCC) 09/27/2017   Severe episode of recurrent major depressive disorder, without  psychotic features (HCC)     Major depressive disorder, recurrent episode (HCC) 08/17/2016   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  Patient had a sleep study conducted through Dr. Albertina Hugger that did not identify obstructive sleep apnea or central apnea.  Tests Administered: Controlled Oral Word Association Test (COWAT; FAS & Animals)  Finger Tapping Test (FTT) Grooved Pegboard Trail Making Test (TMT; Part A & B) Wechsler Adult Intelligence Scale, 4th Edition (WAIS-IV) Wechsler Memory Scale, 4th Edition (WMS-IV); Adult Battery  Participation Level:   Active  Participation Quality:  Appropriate      Behavioral Observation:  The patient appeared well-groomed and appropriately dressed. His manners were polite and appropriate to the situation. The patient's attitude towards testing was positive and his effort was good. Diminished use of the left hand was noted during testing, especially during measures of fine motor control. The patient expressed some anxiety surrounding the testing process and presented as somewhat reserved throughout the testing session.   Well Groomed, Alert, and somewhat anxious during testing.   Test Results:   The patient completed his GED and has done many types of jobs/work through the years.  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  Since the patient's initial  surgery in 2012 the patient has worked in a Wellsite geologist, [employer] and now works in the receiving and Equities trader for [employer].  It is estimated that the patient likely performed in the average range premorbidly as far as global cognitive functioning and we utilize a 50th percentile comparison point for comparisons to current obtained  neuropsychological test data.  Secondly, an estimation was made as to the validity of the current assessment.  While the patient suggested behaviorally and expressed verbally that he was somewhat anxious in the formal testing session he did appear to try his hardest throughout and behavioral observations suggest that this is a valid assessment.  Embedded validity checks also suggest a valid assessment and this does appear to be a fair representation of his current cognitive functioning.  COWAT:  FAS total= 21 Z= -1.84 Animals total= 13 Z= -1.83  In order to objectively assess the patient's expressive language capacity he was administered the control or word association test which looks at both lexical and semantic fluency.  On the FAS subtest, which assesses lexical fluency the patient was only able to produce a total of 21 words total for each of the 3 trials this is a significant impairment with regard to lexical fluency.  The patient had similar performance under semantic challenges of the animal naming subtest where he also had significant deficits for semantic fluency.  FTT:  R (DH) Average= 54.4 Percentile Rank= 55th L (NDH) Average= 28.4 Percentile Rank= 19th  Grooved Pegboard: R(DH) time= 106s Drops= 0 Percentile Rank= 27th L (NDH) time= 300s (D/C) Drops= 15 Percentile Rank= 11th  Since the patient's most recent tumor resection and hemorrhagic bleeding from branch of the MCA the patient developed significant motor deficits on the left side including changes in facial motor functions, left hand deficits, and other sensorimotor changes at for his left side.  Consistent with this observed change the patient showed much greater deficits for both fine motor control and fine motor bead for the left hand versus the right hand.  The patient did display some fine motor deficits for his right hand as well but they were much greater for left hand consistent with lateralization of his  resection/hemorrhagic bleed in the right temporal lobe that has affected regions outside of the right temporal lobe including right frontal and right parietal lobe brain regions.   TMT A/B:  Trails A time= 41s Errors= 0  Percentile Rank= 41st Trails B time= 85s Errors= 0  Percentile Rank= 46th   The patient was administered the Trail Making Test part A and B to assess primary visual scanning and visual searching, fine motor speed, attentional shifting capacity and overall speed of mental operations.  The patient actually performed generally consistent with his premorbid estimates without significant deficits in these domains.  The patient is right-handed and this task is completed slowly with his right hand.  This performance would suggest minimal involvement at an forward regions of his frontal lobe and anterior regions of his parietal lobe.  WAIS-IV:              Composite Score Summary          Scale Sum of Scaled Scores Composite Score Percentile Rank 95% Conf. Interval Qualitative Description  Verbal Comprehension 33 VCI 105 63 99-110 Average  Perceptual Reasoning 28 PRI 96 39 90-102 Average  Working Memory 16 WMI 89 23 83-96 Low Average  Processing Speed 15 PSI 86 18 79-96 Low Average  Full Scale 92  FSIQ 94 34 90-98 Average  General Ability 61 GAI 101 53 96-106 Average    In order to provide an assessment of a wide range of cognitive domains in a highly structured and excellently normed battery of measures that would be repeatable across testing centers the patient was administered the Wechsler Adult Intelligence Scale-V.  As the patient has had initial right temporal tumor and subsequent resection/craniotomy in 2012, follow-up radiation in 2013 and now revision/new craniotomy with hemorrhagic bleed and right temporal lobe and bleed involving the right MCA distribution the patient's current cognitive score should not be seen as reflexive of his lifelong capacity but a description of  his current status.  2 Global/composite scores were calculated in this measure.  The patient produced a full-scale IQ score of 94 which falls at the 34th percentile and is in the average range.  This is only slightly below predicted levels of premorbid functioning with the patient's greatest deficits having to do with working memory and information processing speed both of which were in the low average range and other cognitive domains including visual-spatial and visual processing/reasoning capacity and language based skills performing consistent with premorbid estimates.  When taking these attentional and focus execute capacities out of other global composite analysis (general abilities index score) the patient did show expected improvements producing a GAI of 100, following at the 53rd percentile in the average range and generally consistent with premorbid estimates.          Verbal Comprehension Subtests Summary        Subtest Raw Score Scaled Score Percentile Rank Reference Group Scaled Score SEM  Similarities 24 9 37 10 1.04  Vocabulary 42 11 63 12 0.73  Information 19 13 84 14 0.73   The patient produced a verbal comprehension index score of 105 which falls at the 63rd percentile and is in the average range relative to a normative population.  I suspect these are consistent for the most part from premorbid estimates particularly for the patient's vocabulary knowledge and his general fund of information.  The patient's general fund of information is actually in the high average range following at the 84th percentile and appears to be well-preserved.  Given the fact that much of this information is probably stored in his left temporal lobe this would be consistent with little to no impact for left hemispheric function.  The patient did show some mild relative weakness with regard to verbal reasoning and problem-solving although this is still in the average range and not consistent with any  significant deficits and likely representing minimal anterior frontal lobe involvement and his deficits.  Perceptual Reasoning Subtests Summary        Subtest Raw Score Scaled Score Percentile Rank Reference Group Scaled Score SEM  Block Design 34 9 37 8 0.95  Matrix Reasoning 14 8 25 7  0.95  Visual Puzzles 15 11 63 10 0.85   The patient produced a perceptual reasoning index score of 96 which falls the 39th percentile and is in the average range relative to a normative population.  There was very little scatter noted within subtest performance with the patient performing generally in the average range on measures of visual motor skills and ability to analyze geometric patterns and perform part-whole recognition skills, fluid visual reasoning and visual processing skills and only mild relative weaknesses with regard to broad visual intelligence and perceptual organizational skills.  This again would be consistent with very little or minimal impact and posterior parietal lobe  regions or anterior frontal lobe right hemisphere regions.  Working Comptroller Raw Score Scaled Score Percentile Rank Reference Group Scaled Score SEM  Digit Span 26 9 37 9 0.73  Arithmetic 11 7 16 8  0.90   The patient produced a working memory index score of 89 which falls at the 23rd percentile in the low average range relative to normative population.  Given the patient's previous educational experiences and occupational experiences this is only slightly below predicted levels with primary difficulty having to do with the efficiency with which he processes information and is active auditory Register with primary auditory encoding appearing to be generally within normal limits and not indicative of significant deficits.  The patient did display indications of greater weakness with regard to active processing of information in his auditory Register.  Processing Speed Subtests Summary        Subtest  Raw Score Scaled Score Percentile Rank Reference Group Scaled Score SEM  Symbol Search 20 6 9 5  1.56  Coding 61 9 37 8 1.20   The patient produced a processing speed index score of 86 which falls 18th percentile in the low average range and is his lowest area relatively speaking on this measure.  There was significant scatter noted within subtest performance.  The patient likely displayed this difference related to motor functions of the eyes rather than a primary deficit is having to do with focus execute/information processing speed weaknesses.  The patient did better on visual scanning and visual searching challenges that involved primarily vertical scanning with greater weaknesses for horizontal scanning.  This is likely consistent with other motor deficits for his left side making greater horizontal scanning efficiency more problematic.     WMS-IV:         Index Score Summary        Index Sum of Scaled Scores Index Score Percentile Rank 95% Confidence Interval Qualitative Descriptor  Auditory Memory (AMI) 50 115 84 108-120 High Average  Visual Memory (VMI) 32 87 19 82-93 Low Average  Visual Working Memory (VWMI) 17 91 27 84-99 Average  Immediate Memory (IMI) 40 100 50 94-106 Average  Delayed Memory (DMI) 42 104 61 97-111 Average    As the patient is describing more memory difficulties he was administered the Wechsler Memory Scale-IV to provide a systematic and structured assessment of a wide range of attention and memory components.  On the Wechsler Adult Intelligence Scale the patient showed adequate primary auditory encoding but some mild weaknesses with regard to his capacity to actively process information in his auditory Register.  The patient showed similar pattern with regard to visual working memory as he produced a visual working memory index score of 91 and fell at the 27th percentile looking at individual subtest that this measure the patient also showed adequate primary visual  encoding capacity but some greater relative weakness when actively processing information in his visual Register.  The level of primary auditory and visual encoding should not have a significant deleterious impact on the patient's capacity to store and organize new information.  Breaking memory functions down between auditory versus visual the patient produced an auditory memory index score of 115 which falls at the 84th percentile and is in the high average range.  This is in fact his greatest area of strength in all domains assessed.  The patient did very well on all auditory memory challenges.  This is in contrast to his performance for  visual memory where he produced a visual memory index score of 87 falling at the 19th percentile in the low average range.  While this is not profoundly or significantly impaired it is significantly below predicted levels of premorbid functioning.  The contrast between auditory and visual memory functions are consistent with the brain region involved with his brain tumor and previous craniotomies and right hemispheric bleed.  This is very consistent with the expected lateralization of findings giving his medical history.  Breaking memory functions down between immediate versus delayed the patient produced an immediate memory index score of 100 which falls at the 50th percentile and a delayed memory index score of 104 which falls at the 61st percentile.  Especially for auditory information the patient appears to be able to adequately store, organize and retain information over a period of delay.  Even the visual information that he was asked to initially learned and had difficulty relatively speaking storing new visual information he effectively retained the more limited visual information that he did initially store and organize over time.  There were no indications of loss of information over time.  The patient did particularly well in recognition/cued recall for auditory  information as expected.            Primary Subtest Scaled Score Summary       Subtest Domain Raw Score Scaled Score Percentile Rank  Logical Memory I AM 27 11 63  Logical Memory II AM 26 12 75  Verbal Paired Associates I AM 42 13 84  Verbal Paired Associates II AM 13 14 91  Designs I VM 50 6 9  Designs II VM 50 9 37  Visual Reproduction I VM 35 10 50  Visual Reproduction II VM 15 7 16           Auditory Memory Process Score Summary      Process Score Raw Score Scaled Score Percentile Rank Cumulative Percentage (Base Rate)  LM II Recognition 24 - - 26-50%  VPA II Recognition 40 - - >75%         Visual Memory Process Score Summary      Process Score Raw Score Scaled Score Percentile Rank Cumulative Percentage (Base Rate)  DE I Content 24 4 2  -  DE I Spatial 12 6 9  -  DE II Content 35 10 50 -  DE II Spatial 11 9 37 -  DE II Recognition 14 - - 26-50%  VR II Recognition 4 - - 17-25%    ABILITY-MEMORY ANALYSIS   Ability Score:    VCI: 105 Date of Testing:           WAIS-IV; WMS-IV 2024/01/23             Predicted Difference Method    Index Predicted WMS-IV Index Score Actual WMS-IV Index Score Difference Critical Value   Significant Difference Y/N Base Rate  Auditory Memory 103 115 -12 9.43 Y 15-20%  Visual Memory 102 87 15 9.19 Y 10-15%  Visual Working Memory 103 91 12 11.15 Y 15-20%  Immediate Memory 103 100 3 10.27 N    Delayed Memory 103 104 -1 10.03 N      Summary of Results:   The results of the current neuropsychological evaluation are very consistent with what would be expected given the patient's past medical history.  The patient shows well retained intellectual and cognitive abilities for language based information even though he does display some changes in expressive language capacity and speed.  The  patient is showing considerable retention of long-term information, vocabulary knowledge and other expressive language and auditory based types of capacity.   It is important to note the patient's generally well-preserved visual-spatial and visual constructional capacity retention and maintaining good visual reasoning and problem-solving skills.  This would suggest that deeper white matter regions connecting anterior frontal lobe regions, which appear to be minimally impacted in posterior parietal lobe regions, which also appear to be minimally impacted our also generally minimally impacted.  Significant left-handed deficits with regard to fine motor speed and fine motor control persist but the patient is maintaining these capacities quite well for his right hand, which is consistent with lateralization of his neurological deficits.  The patient shows very good performance with regard to auditory memory and learning with specific deficits with regard to visual memory and learning.  The patient has visual memory deficits are not simply related to attentional components and the patient is having clear lateralization of deficits in the right temporal lobe and well retained capacity for the left temporal lobe.  Adequate function with regard to hippocampal brain regions is noted.  Cognitive strengths and weaknesses would be consistent with right temporal lobe, posterior right frontal lobe and anterior right parietal lobe involvement primarily with little change in right hemisphere white matter connections or deeper limbic involvement.  Impression/Diagnosis:   The results of the current neuropsychological evaluation are very consistent with the patient's medical history.  As noted above there are specific brain region involvement and his cognitive deficits consistent with his medical history primarily related to posterior right frontal, anterior right parietal and significant right temporal lobe involvement.  The patient's memory changes are almost exclusively related to visual memory and learning with significant deficits for storage and organization of new information.   However, while the patient has much greater difficulty storing and organizing new visual information the more limited visual information he is able to store and organize does appear to be retained over time.  The patient has noted difficulty with seeing where people are going or what they are doing does not appear to be a primary visual deficit but consistent with the visual spatial aspects involved with his right parietal lobe and being able to process that visual information effectively.  Diagnostically, we have a pretty clear picture that the patient's cognitive changes are consistent with his medical history although a significant amount of this changes due to his recent resection of tumor performed in May 2024 with resulting bleeding from his right MCA branch.  Left-sided motor deficits and paresthesias are consistent with this brain region.  As far as recommendations, given the visual memory and visual processing components driving should be more fully assessed although he does appear to be maintaining most of his visual-spatial and visual reasoning capacities well.  I would suggest if the patient was to drive that he have a formal driving assessment performed to assure that these very practical day-to-day types of aspects are fully assessed for safety reasons.  We are now more than a year post most recent neurosurgical efforts and less change in his residual cognitive deficits is likely with time.  The patient is very likely to retain most of the deficits noted on the current assessment over time.  The nature of his current cognitive deficits are probably also being further impacted by his chronic posttraumatic stress disorder with depression and anxiety and continued management of these features should be maintained.  The patient appears to be responding well to his  current psychotropic regimen including Trileptal , Lexapro , gabapentin , BuSpar  and Adderall for improved attention.  We should continue to  monitor Adderall and its potential impact on his anxiety although the patient reports that it is helpful to him.  I would also encourage the patient to continue to work on his chronic posttraumatic stress type symptoms and we will address strategies in greater detail during the feedback visit for this current neuropsychological evaluation.  As the patient is continuing to work on rehab and therapeutic efforts I will remain available with any specific questions from his treating physical medicine doctor Dr. Dorn Gaskins and his treating neurologist Dr. Albertina Hugger.    Diagnosis:    Cognitive deficits  Anaplastic oligodendroglioma of temporal lobe (HCC)  Cerebrovascular accident (CVA) due to other mechanism (HCC)  Left hemiparesis (HCC)  PTSD (post-traumatic stress disorder)  Nightmares   _____________________ Chapman Commodore, Psy.D. Clinical Neuropsychologist

## 2024-02-14 ENCOUNTER — Ambulatory Visit (HOSPITAL_COMMUNITY): Admission: RE | Admit: 2024-02-14 | Source: Ambulatory Visit

## 2024-02-14 ENCOUNTER — Ambulatory Visit: Admitting: Internal Medicine

## 2024-02-14 DIAGNOSIS — F32A Depression, unspecified: Secondary | ICD-10-CM | POA: Diagnosis not present

## 2024-02-14 DIAGNOSIS — R531 Weakness: Secondary | ICD-10-CM | POA: Diagnosis not present

## 2024-02-14 DIAGNOSIS — R634 Abnormal weight loss: Secondary | ICD-10-CM | POA: Diagnosis not present

## 2024-02-14 DIAGNOSIS — R200 Anesthesia of skin: Secondary | ICD-10-CM | POA: Diagnosis not present

## 2024-02-14 DIAGNOSIS — R202 Paresthesia of skin: Secondary | ICD-10-CM | POA: Diagnosis not present

## 2024-02-14 DIAGNOSIS — Z923 Personal history of irradiation: Secondary | ICD-10-CM | POA: Diagnosis not present

## 2024-02-14 DIAGNOSIS — R569 Unspecified convulsions: Secondary | ICD-10-CM | POA: Diagnosis not present

## 2024-02-14 DIAGNOSIS — Z6823 Body mass index (BMI) 23.0-23.9, adult: Secondary | ICD-10-CM | POA: Diagnosis not present

## 2024-02-14 DIAGNOSIS — Z79899 Other long term (current) drug therapy: Secondary | ICD-10-CM | POA: Diagnosis not present

## 2024-02-14 DIAGNOSIS — Z79630 Long term (current) use of alkylating agent: Secondary | ICD-10-CM | POA: Diagnosis not present

## 2024-02-14 DIAGNOSIS — C712 Malignant neoplasm of temporal lobe: Secondary | ICD-10-CM | POA: Diagnosis not present

## 2024-02-14 DIAGNOSIS — Z96641 Presence of right artificial hip joint: Secondary | ICD-10-CM | POA: Diagnosis not present

## 2024-02-14 DIAGNOSIS — Z87891 Personal history of nicotine dependence: Secondary | ICD-10-CM | POA: Diagnosis not present

## 2024-02-14 LAB — BASIC METABOLIC PANEL WITH GFR
BUN: 18 (ref 4–21)
CO2: 28 — AB (ref 13–22)
Chloride: 102 (ref 99–108)
Creatinine: 0.9 (ref 0.6–1.3)
Glucose: 89
Potassium: 3.8 meq/L (ref 3.5–5.1)
Sodium: 137 (ref 137–147)

## 2024-02-14 LAB — HEPATIC FUNCTION PANEL
ALT: 15 U/L (ref 10–40)
AST: 17 (ref 14–40)
Alkaline Phosphatase: 51 (ref 25–125)
Bilirubin, Total: 1

## 2024-02-14 LAB — COMPREHENSIVE METABOLIC PANEL WITH GFR
Albumin: 4.3 (ref 3.5–5.0)
Calcium: 9.1 (ref 8.7–10.7)
eGFR: 106

## 2024-02-19 ENCOUNTER — Ambulatory Visit (HOSPITAL_COMMUNITY)

## 2024-02-19 ENCOUNTER — Other Ambulatory Visit: Payer: Self-pay | Admitting: Neurology

## 2024-02-19 ENCOUNTER — Ambulatory Visit: Admitting: Internal Medicine

## 2024-02-19 DIAGNOSIS — F4310 Post-traumatic stress disorder, unspecified: Secondary | ICD-10-CM

## 2024-02-20 ENCOUNTER — Ambulatory Visit

## 2024-02-22 ENCOUNTER — Encounter (HOSPITAL_BASED_OUTPATIENT_CLINIC_OR_DEPARTMENT_OTHER): Admitting: Psychology

## 2024-02-22 DIAGNOSIS — R4189 Other symptoms and signs involving cognitive functions and awareness: Secondary | ICD-10-CM | POA: Diagnosis not present

## 2024-02-22 DIAGNOSIS — F515 Nightmare disorder: Secondary | ICD-10-CM | POA: Diagnosis not present

## 2024-02-22 DIAGNOSIS — C712 Malignant neoplasm of temporal lobe: Secondary | ICD-10-CM

## 2024-02-22 DIAGNOSIS — I6389 Other cerebral infarction: Secondary | ICD-10-CM | POA: Diagnosis not present

## 2024-02-22 DIAGNOSIS — G8194 Hemiplegia, unspecified affecting left nondominant side: Secondary | ICD-10-CM | POA: Diagnosis not present

## 2024-02-22 DIAGNOSIS — F4310 Post-traumatic stress disorder, unspecified: Secondary | ICD-10-CM

## 2024-02-23 ENCOUNTER — Encounter: Payer: Self-pay | Admitting: Physical Medicine and Rehabilitation

## 2024-02-27 NOTE — Progress Notes (Signed)
 Neuropsychological Evaluation   Patient:  Shawn York   DOB: 10-Jun-1976  MR Number: [id number]  Location: [facility] [place] [facility] [address] [place], [address] [place] [address] Dept: [phone]  Start: 3 PM End: 4 PM  Provider/Observer:     Norleen JONELLE Asa PsyD  Chief Complaint:      Chief Complaint  Patient presents with   Post-Traumatic Stress Disorder   Memory Loss   Other     Executive functioning and problem-solving deficits     02/22/2024 3 PM-4 PM: Today I provided feedback regarding the results of the recent neuropsychological evaluation.  We went over the results with diagnostic implications as well as treatment recommendations.  We went into depth regarding recommendations for the patient specifically.  I have included a copy of the reason for service and summary of the evaluation below for convenience and the entire neuropsychological evaluation can be found in the patient's EMR dated  6 02/13/2024.   Reason For Service:       Patient Information:  Shawn York  Age/Gender: 48 year old male Referral Reason: Neuropsychological evaluation due to ongoing executive functioning changes and memory difficulties, left hemiparesis, neuropathic pain in the left hand, and hemisensory deficit. Current Care Providers: Physical medicine (Dr. Emeline) and neurology (Dr. Chalice).  Medical History:  Past Medical History: Anemia Avascular necrosis Brain cancer diagnosed in 2012 Depression Posttraumatic stress disorder (PTSD) Epistaxis Headache Hypertension Seizures  Recent Hospitalization: Admitted to the comprehensive inpatient rehabilitation unit in May 2025. Status post right temporal craniotomy.  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 Completed  standard radiation in 2013. Significant psychiatric history predating the tumor diagnosis, including PTSD and depression with anxiety, managed through psychiatry. Current psychotropic regimen includes Trileptal , Rexulti , and BuSpar .  Recent Imaging and Surgery: MRI on 11/09/2022 indicated suspicion of progression; repeat MRI 4 weeks later confirmed progression. Underwent right temporal craniotomy with tumor resection on 12/29/2022 at [facility], with complications of bleeding from a branch of the MCA. Persistent postoperative motor deficits on the left side, left facial droop, and near-complete paralysis of the left hand. Follow-up MRI showed adjacent extra-axial hemorrhage, small volume subarachnoid hemorrhage, and trace cryptic tonic edema in the resection cavity.  Current Rehabilitation: Admitted to the comprehensive inpatient rehabilitation unit due to left-sided weakness, sensory changes, left inattention, headaches, and decreased insight into deficits.  Clinical Observations:  Inpatient Visit (01/11/2024): Patient's affect was flat with unusual presentation. Significant motor deficits and attentional issues noted. Clear executive functioning deficits observed.  Current Follow-Up: Followed up post-acute rehab on an outpatient basis through physical medicine (Dr. Emeline) and neurology (Dr. Chalice). Current medications include Adderall (for longstanding attentional issues), BuSpar  (for anxiety symptoms), Lexapro  (for anxiety and depression), Trileptal , and Neurontin .  Current Symptoms and Patient Report:  During Clinical Visit (Today): Continued symptoms post-surgery/brain hemorrhage. Adequate problem-solving but delayed registration in conversations. Minimal change in awareness of surroundings; difficulty sensing where people are going. Memory issues worsened since the recent craniotomy and brain bleed. Difficulties with both semantic and episodic memory. Left hand often  appears purple and feels cold and strange up to the forearm.  Psychiatric History:  Chronic PTSD: Attributed to childhood experiences in a cult. Left the cult at age 77; parents remain in the cult. Continues to have nightmares and flashbacks of childhood experiences.     Impression/Diagnosis:  The results of the current neuropsychological evaluation are very consistent with the patient's medical history.  As noted above there are specific brain region involvement and his cognitive deficits consistent with his medical history primarily related to posterior right frontal, anterior right parietal and significant right temporal lobe involvement.  The patient's memory changes are almost exclusively related to visual memory and learning with significant deficits for storage and organization of new information.  However, while the patient has much greater difficulty storing and organizing new visual information the more limited visual information he is able to store and organize does appear to be retained over time.  The patient has noted difficulty with seeing where people are going or what they are doing does not appear to be a primary visual deficit but consistent with the visual spatial aspects involved with his right parietal lobe and being able to process that visual information effectively.  Diagnostically, we have a pretty clear picture that the patient's cognitive changes are consistent with his medical history although a significant amount of this changes due to his recent resection of tumor performed in May 2024 with resulting bleeding from his right MCA branch.  Left-sided motor deficits and paresthesias are consistent with this brain region.  As far as recommendations, given the visual memory and visual processing components driving should be more fully assessed although he does appear to be maintaining most of his visual-spatial and visual reasoning capacities well.  I would suggest if the  patient was to drive that he have a formal driving assessment performed to assure that these very practical day-to-day types of aspects are fully assessed for safety reasons.  We are now more than a year post most recent neurosurgical efforts and less change in his residual cognitive deficits is likely with time.  The patient is very likely to retain most of the deficits noted on the current assessment over time.  The nature of his current cognitive deficits are probably also being further impacted by his chronic posttraumatic stress disorder with depression and anxiety and continued management of these features should be maintained.  The patient appears to be responding well to his current psychotropic regimen including Trileptal , Lexapro , gabapentin , BuSpar  and Adderall for improved attention.  We should continue to monitor Adderall and its potential impact on his anxiety although the patient reports that it is helpful to him.  I would also encourage the patient to continue to work on his chronic posttraumatic stress type symptoms and we will address strategies in greater detail during the feedback visit for this current neuropsychological evaluation.  As the patient is continuing to work on rehab and therapeutic efforts I will remain available with any specific questions from his treating physical medicine doctor Dr. Emeline and his treating neurologist Dr. Chalice.    Diagnosis:    PTSD (post-traumatic stress disorder)  Cognitive deficits  Anaplastic oligodendroglioma of temporal lobe (HCC)  Cerebrovascular accident (CVA) due to other mechanism (HCC)  Left hemiparesis (HCC)   _____________________ Norleen Asa, Psy.D. Clinical Neuropsychologist

## 2024-02-28 ENCOUNTER — Ambulatory Visit: Payer: Self-pay | Admitting: Internal Medicine

## 2024-02-28 ENCOUNTER — Ambulatory Visit (INDEPENDENT_AMBULATORY_CARE_PROVIDER_SITE_OTHER)

## 2024-02-28 ENCOUNTER — Encounter: Payer: Self-pay | Admitting: Internal Medicine

## 2024-02-28 ENCOUNTER — Ambulatory Visit: Admitting: Internal Medicine

## 2024-02-28 VITALS — BP 116/78 | HR 90 | Temp 98.9°F | Resp 16 | Ht 73.0 in | Wt 173.6 lb

## 2024-02-28 DIAGNOSIS — M25511 Pain in right shoulder: Secondary | ICD-10-CM | POA: Diagnosis not present

## 2024-02-28 DIAGNOSIS — M19011 Primary osteoarthritis, right shoulder: Secondary | ICD-10-CM | POA: Diagnosis not present

## 2024-02-28 DIAGNOSIS — G8929 Other chronic pain: Secondary | ICD-10-CM | POA: Diagnosis not present

## 2024-02-28 DIAGNOSIS — D539 Nutritional anemia, unspecified: Secondary | ICD-10-CM

## 2024-02-28 DIAGNOSIS — I10 Essential (primary) hypertension: Secondary | ICD-10-CM

## 2024-02-28 DIAGNOSIS — D696 Thrombocytopenia, unspecified: Secondary | ICD-10-CM | POA: Diagnosis not present

## 2024-02-28 LAB — CBC WITH DIFFERENTIAL/PLATELET
Basophils Absolute: 0 10*3/uL (ref 0.0–0.1)
Basophils Relative: 0.5 % (ref 0.0–3.0)
Eosinophils Absolute: 0 10*3/uL (ref 0.0–0.7)
Eosinophils Relative: 0.7 % (ref 0.0–5.0)
HCT: 45 % (ref 39.0–52.0)
Hemoglobin: 15.6 g/dL (ref 13.0–17.0)
Lymphocytes Relative: 26.3 % (ref 12.0–46.0)
Lymphs Abs: 1.5 10*3/uL (ref 0.7–4.0)
MCHC: 34.7 g/dL (ref 30.0–36.0)
MCV: 96.9 fl (ref 78.0–100.0)
Monocytes Absolute: 0.5 10*3/uL (ref 0.1–1.0)
Monocytes Relative: 9.9 % (ref 3.0–12.0)
Neutro Abs: 3.5 10*3/uL (ref 1.4–7.7)
Neutrophils Relative %: 62.6 % (ref 43.0–77.0)
Platelets: 200 10*3/uL (ref 150.0–400.0)
RBC: 4.64 Mil/uL (ref 4.22–5.81)
RDW: 13.1 % (ref 11.5–15.5)
WBC: 5.5 10*3/uL (ref 4.0–10.5)

## 2024-02-28 LAB — IBC + FERRITIN
Ferritin: 67.1 ng/mL (ref 22.0–322.0)
Iron: 248 ug/dL — ABNORMAL HIGH (ref 42–165)
Saturation Ratios: 93.7 % — ABNORMAL HIGH (ref 20.0–50.0)
TIBC: 264.6 ug/dL (ref 250.0–450.0)
Transferrin: 189 mg/dL — ABNORMAL LOW (ref 212.0–360.0)

## 2024-02-28 LAB — VITAMIN B12: Vitamin B-12: 458 pg/mL (ref 211–911)

## 2024-02-28 LAB — FOLATE: Folate: 22.3 ng/mL (ref >5.9)

## 2024-02-28 NOTE — Progress Notes (Unsigned)
 Subjective:  Patient ID: Shawn York, male    DOB: 10/08/1975  Age: 48 y.o. MRN: [id number]  CC: Hypertension   HPI Shawn York presents for f/up ----  Discussed the use of AI scribe software for clinical note transcription with the patient, who gave verbal consent to proceed.  History of Present Illness   Shawn York is a 48 year old male who presents with shoulder pain.  He experiences significant shoulder pain, which he attributes to potential overuse due to hemiparesis on the left side. The pain is described as severe, with the shoulder 'really killing' him. No weakness, dizziness, or lightheadedness. No chest pain or shortness of breath, but he mentions a slight sensation of heart racing.  He is currently taking amphetamines and indapamide  for blood pressure management. His blood pressure was noted to be low, at approximately 100/84.  He reports no weight loss but confirms that he is still eating. No bleeding, bruising, vomiting, diarrhea, or blood in stool. He is undergoing oral chemotherapy, which has been noted to lower his platelet count, although he states that the count has not been low. He experiences nausea as a side effect of the chemotherapy.  Recent lab work was conducted on February 13, 2024, primarily to monitor his platelet count for chemotherapy.       Outpatient Medications Prior to Visit  Medication Sig Dispense Refill   amphetamine -dextroamphetamine  (ADDERALL XR) 20 MG 24 hr capsule Take 1 capsule (20 mg total) by mouth daily. Before breakfast po. 30 capsule 0   amphetamine -dextroamphetamine  (ADDERALL) 10 MG tablet Take 1 tablet (10 mg total) by mouth as needed. Please use this medication no later than 2 pm as needed for alertness and focus. 30 tablet 0   b complex vitamins capsule Take 1 capsule by mouth daily.     baclofen  (LIORESAL ) 10 MG tablet TAKE 1 TABLET BY MOUTH AT BEDTIME AS NEEDED FOR MUSCLE SPASMS 30 tablet 5   busPIRone  (BUSPAR ) 30 MG tablet Take 1  tablet (30 mg total) by mouth 2 (two) times daily. 180 tablet 1   escitalopram  (LEXAPRO ) 10 MG tablet Take 1 tablet (10 mg total) by mouth daily. 90 tablet 0   gabapentin  (NEURONTIN ) 300 MG capsule Take 1 capsule (300 mg total) by mouth 3 (three) times daily. 90 capsule 5   levocetirizine (XYZAL ) 5 MG tablet TAKE 1 TABLET BY MOUTH EVERY DAY IN THE EVENING 90 tablet 0   ondansetron  (ZOFRAN ) 8 MG tablet Take by mouth.     Oxcarbazepine  (TRILEPTAL ) 300 MG tablet Take 1 tablet (300 mg total) by mouth 2 (two) times daily. 60 tablet 0   temozolomide (TEMODAR) 100 MG capsule Take 100 mg by mouth daily. 5 days on 28 days off     temozolomide (TEMODAR) 250 MG capsule Take 250 mg by mouth daily. 5 days on and 28 days off     indapamide  (LOZOL ) 1.25 MG tablet Take 1 tablet (1.25 mg total) by mouth daily. 90 tablet 0   No facility-administered medications prior to visit.    ROS Review of Systems  Musculoskeletal:  Positive for arthralgias. Negative for back pain and myalgias.    Objective:  BP 116/78 (BP Location: Left Arm, Patient Position: Sitting, Cuff Size: Normal)   Pulse 90   Temp 98.9 F (37.2 C) (Oral)   Resp 16   Ht 6' 1 (1.854 m)   Wt 173 lb 9.6 oz (78.7 kg)   SpO2 99%   BMI 22.90 kg/m  BP Readings from Last 3 Encounters:  02/28/24 116/78  12/25/23 112/84  09/25/23 112/76    Wt Readings from Last 3 Encounters:  02/28/24 173 lb 9.6 oz (78.7 kg)  12/25/23 173 lb (78.5 kg)  09/25/23 182 lb (82.6 kg)    Physical Exam Musculoskeletal:     Right shoulder: No swelling, deformity, effusion, tenderness, bony tenderness or crepitus. Decreased range of motion.     Left shoulder: Normal. Normal range of motion.  Neurological:     Mental Status: Mental status is at baseline.     Motor: Weakness present.  Psychiatric:        Mood and Affect: Mood normal.        Behavior: Behavior normal.     Lab Results  Component Value Date   WBC 4.9 07/24/2023   HGB 14.1 07/24/2023    HCT 40.9 07/24/2023   PLT 200.0 07/24/2023   GLUCOSE 85 03/30/2023   CHOL 170 07/24/2023   TRIG 44.0 07/24/2023   HDL 63.50 07/24/2023   LDLDIRECT 127.0 05/17/2021   LDLCALC 97 07/24/2023   ALT 15 02/14/2024   AST 17 02/14/2024   NA 137 02/14/2024   K 3.8 02/14/2024   CL 102 02/14/2024   CREATININE 0.9 02/14/2024   BUN 18 02/14/2024   CO2 28 (A) 02/14/2024   TSH 1.08 03/30/2023   INR 1.0 12/15/2021    MR BRAIN W WO CONTRAST Result Date: 05/11/2023 CLINICAL DATA:  48 year old male with 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 Most recent resection in April or May of this year. Restaging. EXAM: MRI HEAD WITHOUT AND WITH CONTRAST TECHNIQUE: Multiplanar, multiecho pulse sequences of the brain and surrounding structures were obtained without and with intravenous contrast. CONTRAST:  10 mL Vueway  COMPARISON:  Brain MRI 02/15/2023 and earlier. FINDINGS: Brain: Subtotal right temporal lobe resection changes. Stable resection cavity since June. Patchy T2 and FLAIR hyperintensity along the posterior resection margin, affecting the inferior and posterior right insula appears stable. And patchy right operculum cortical and subcortical white matter T2/FLAIR hyperintensity have regressed. Mild ex vacuo enlargement of the right lateral ventricle. No regional mass effect. Regressed signal heterogeneity on DWI, no suspicious diffusion changes now. Stable hemosiderin. Following contrast no masslike or suspicious postcontrast enhancement now. Overlying postoperative small right subdural collection and/or dural thickening has also regressed. No superimposed restricted diffusion suggestive of acute infarction. No midline shift or acute intracranial hemorrhage. Cervicomedullary junction and pituitary are within normal limits. No new signal changes elsewhere in the brain. Right hemisphere superficial siderosis outside of the immediate resection site. Vascular: Major intracranial vascular flow voids are  stable. Following contrast the major dural venous sinuses are enhancing and appear to be patent. Skull and upper cervical spine: Right side craniotomy changes. Background bone marrow signal within normal limits. Negative visible cervical spine, spinal cord. Sinuses/Orbits: Stable, negative. Other: Right mastoid effusion has largely resolved. Visible internal auditory structures appear normal. Negative visible scalp and face. IMPRESSION: Satisfactory post resection appearance. No new intracranial abnormality. Electronically Signed   By: VEAR Hurst M.D.   On: 05/11/2023 10:16   DG Shoulder Right Result Date: 02/28/2024 CLINICAL DATA:  pain and decreased ROM EXAM: RIGHT SHOULDER - 2+ VIEW COMPARISON:  February 14, 2023 FINDINGS: No acute fracture or dislocation. Moderate joint space loss of the [facility] joint with associated bony cystic change. Soft tissues are unremarkable. IMPRESSION: 1. No acute fracture or dislocation. 2. Moderate osteoarthritis of the AC joint. Electronically Signed   By: Rogelia Myers  M.D.   On: 02/28/2024 15:44     Assessment & Plan:  Chronic right shoulder pain -     DG Shoulder Right; Future  Deficiency anemia -     IBC + Ferritin; Future -     CBC with Differential/Platelet; Future -     Vitamin B12; Future -     Folate; Future -     Vitamin B1; Future -     Zinc ; Future -     Methylmalonic acid, serum; Future  Thrombocytopenia (HCC) -     CBC with Differential/Platelet; Future -     Vitamin B12; Future -     Folate; Future -     Vitamin B1; Future -     Zinc ; Future -     Methylmalonic acid, serum; Future  Arthritis of right acromioclavicular joint -     Ambulatory referral to Orthopedic Surgery     Follow-up: Return in about 3 months (around 05/30/2024).  Debby Molt, MD

## 2024-02-28 NOTE — Patient Instructions (Signed)
Shoulder Pain Many things can cause shoulder pain, including: An injury to the shoulder. Overuse of the shoulder. Arthritis. The source of the pain can be: Inflammation. An injury to the shoulder joint. An injury to a tendon, ligament, or bone. Follow these instructions at home: Pay attention to changes in your symptoms. Let your health care provider know about them. Follow these instructions to relieve your pain. If you have a removable sling: Wear the sling as told by your provider. Remove it only as told by your provider. Check the skin around the sling every day. Tell your provider about any concerns. Loosen the sling if your fingers tingle, become numb, or become cold. Keep the sling clean. If the sling is not waterproof: Do not let it get wet. Remove it to shower or bathe. Move your arm as little as possible, but keep your hand moving to prevent swelling. Managing pain, stiffness, and swelling  If told, put ice on the painful area. If you have a removable sling or immobilizer, remove it as told by your provider. Put ice in a plastic bag. Place a towel between your skin and the bag. Leave the ice on for 20 minutes, 2-3 times a day. If your skin turns bright red, remove the ice right away to prevent skin damage. The risk of damage is higher if you cannot feel pain, heat, or cold. Move your fingers often to reduce stiffness and swelling. Squeeze a soft ball or a foam pad as much as possible. This helps to keep the shoulder from swelling. It also helps to strengthen the arm. General instructions Take over-the-counter and prescription medicines only as told by your provider. Exercise may help with pain management. Perform exercises if told by your provider. You may be referred to a physical therapist to help in your recovery process. Keep all follow-up visits in order to avoid any type of permanent shoulder disability or chronic pain problems. Contact a health care provider  if: Your pain is not relieved with medicines. New pain develops in your arm, hand, or fingers. You loosen your sling and your arm, hand, or fingers remain tingly, numb, swollen, or painful. Get help right away if: Your arm, hand, or fingers turn white or blue. This information is not intended to replace advice given to you by your health care provider. Make sure you discuss any questions you have with your health care provider. Document Revised: 03/18/2022 Document Reviewed: 03/18/2022 Elsevier Patient Education  2024 [employer]  

## 2024-03-04 ENCOUNTER — Ambulatory Visit (HOSPITAL_COMMUNITY)
Admission: RE | Admit: 2024-03-04 | Discharge: 2024-03-04 | Disposition: A | Discharge status: Home / Self Care | Source: Ambulatory Visit | Attending: Surgery | Admitting: Surgery

## 2024-03-04 ENCOUNTER — Encounter (HOSPITAL_COMMUNITY): Payer: Self-pay

## 2024-03-04 DIAGNOSIS — I7121 Aneurysm of the ascending aorta, without rupture: Secondary | ICD-10-CM

## 2024-03-04 LAB — ZINC

## 2024-03-04 LAB — VITAMIN B1: Vitamin B1 (Thiamine): 20 nmol/L (ref 8–30)

## 2024-03-04 LAB — METHYLMALONIC ACID, SERUM: Methylmalonic Acid, Quant: 295 nmol/L (ref 55–335)

## 2024-03-04 LAB — EXTRA LAV TOP TUBE

## 2024-03-12 ENCOUNTER — Ambulatory Visit

## 2024-03-22 ENCOUNTER — Other Ambulatory Visit: Payer: Self-pay | Admitting: Internal Medicine

## 2024-03-22 DIAGNOSIS — I10 Essential (primary) hypertension: Secondary | ICD-10-CM

## 2024-03-25 ENCOUNTER — Encounter: Attending: Physical Medicine and Rehabilitation | Admitting: Physical Medicine and Rehabilitation

## 2024-03-25 ENCOUNTER — Encounter: Payer: Self-pay | Admitting: Physical Medicine and Rehabilitation

## 2024-03-25 VITALS — BP 133/88 | HR 81 | Ht 73.0 in | Wt 167.2 lb

## 2024-03-25 DIAGNOSIS — R29818 Other symptoms and signs involving the nervous system: Secondary | ICD-10-CM | POA: Diagnosis not present

## 2024-03-25 DIAGNOSIS — R252 Cramp and spasm: Secondary | ICD-10-CM | POA: Insufficient documentation

## 2024-03-25 DIAGNOSIS — G8194 Hemiplegia, unspecified affecting left nondominant side: Secondary | ICD-10-CM | POA: Diagnosis not present

## 2024-03-25 DIAGNOSIS — R4189 Other symptoms and signs involving cognitive functions and awareness: Secondary | ICD-10-CM | POA: Insufficient documentation

## 2024-03-25 DIAGNOSIS — C712 Malignant neoplasm of temporal lobe: Secondary | ICD-10-CM | POA: Insufficient documentation

## 2024-03-25 MED ORDER — GABAPENTIN 300 MG PO CAPS: 300.0000 mg | ORAL_CAPSULE | Freq: Three times a day (TID) | ORAL | 5 refills | Status: DC

## 2024-03-25 MED ORDER — BACLOFEN 10 MG PO TABS: 10.0000 mg | ORAL_TABLET | Freq: Two times a day (BID) | ORAL | 5 refills | Status: DC

## 2024-03-25 NOTE — Progress Notes (Unsigned)
 Subjective:    Patient ID: Shawn York, male    DOB: 1976-08-15, 48 y.o.   MRN: [id number]  HPI  Shawn York is a 48 y.o. year old male  who  has a past medical history of Anemia, Avascular necrosis (HCC), Brain cancer (HCC) (02/25/2011), Depression, Epistaxis, Headache, Hypertension, PTSD (post-traumatic stress disorder), and Seizures (HCC).  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 Peggye Li at [facility], complicated by R MCA bleeding with L hemiparesis/hemisensory loss mostly isolated to LUE.   Plan from last visit:  Neuropathic pain of left hand Increase gabapentin  from 100 mg 3 times a day to 300 mg 3 times a day for ongoing paresthesias in the left hand   Left hemiparesis (HCC) Patient is interested in pursuing higher level weight bearing activities with his left upper extremity than was done in OT; advised that he is okay to start personal training, and provided a letter of support for this with no gross restrictions.   Spasticity No appreciable tone on today's exam, do not feel that spasticity medications or injections would benefit him at this point.   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 Patient describing symptoms of locking in to certain activities and being unable to multitask; this has affected his ability to switch tasks at work and concentrate actively while driving.  Will talk to Dr. Chalice about adjustment of Adderall given symptoms.   I will look into  vocational rehab to see if there are options for him regarding alternate careers.  Encouraged him to start to resume classes to test his ability to concentrate and perform, starting with 1 at a time.  He wishes to continue his current employment, although we did discuss possibilities for disability today.   Interval Hx:  -  Therapies: Has not looked into vocaitonal training/rehab resources; still working primarily. No major changes in function; still having sensory problems with his fingertips. 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 I notice often when I go to pick one up, my left hand miss a finger or two and I'll smash my pinky. If he's in a hurry he might not notice it isn't right. Has not injured it as far as he knows.    - Follow ups: Dr. Corina 6/26; The main thing I took away is that I have problems with visual processing. He is still driving and has had no further episodes of blanking/spacing out - he is getting good at focussing.   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 Sometimes I just have to take a minute and collect myself. His job has a break room and is good about letting him take breaks when he needs it.   As far as recommendations, given the visual memory and visual processing components driving should be more fully assessed although he does appear to be maintaining most of his visual-spatial and visual reasoning capacities well. I would suggest if the patient was to drive that he have a formal driving assessment performed to assure that these very practical day-to-day types of aspects are fully assessed for safety reasons. We are now more than a year post most recent neurosurgical efforts and less change in his residual cognitive deficits is likely with time.  The patient is very likely to retain most of the deficits noted on the current assessment over time. The nature of his current cognitive deficits are probably also being further impacted by his chronic posttraumatic stress disorder with depression and anxiety and continued management of these features should be maintained. The patient appears to be responding well to his current psychotropic regimen including  Trileptal , Lexapro , gabapentin , BuSpar  and Adderall for improved attention. We should continue to monitor Adderall and its potential impact on his anxiety although the patient reports that it is helpful to him. I would also encourage the patient to continue to work on his chronic posttraumatic stress type symptoms and we will address strategies in greater detail during the feedback visit for this current neuropsychological evaluation. As the patient is continuing to work on rehab and therapeutic efforts I will remain available with any specific questions from his treating physical medicine doctor Dr. Emeline and his treating neurologist Dr. Chalice.    - Falls: none   - DME: He tried a brace on his left hand and felt like it got too tight - it went around his palm.    - Medications: He is taking adderall 20 mg QAM and 10 mg QPM - he ran out of the 10s so he's been splitting the 20s. He thinks they help with time management and with remembering tasks at work.   Baclofen  10 mg a day - does not reduce pain but does reduce tightness in his hand, helps him extend his hand. He thinks once a day works fine.  Gabapenti 300 mg tid - he feels he is not getting a lot of pain anymore except when it is tight; then it feels like the muscle are aching. He usuallt gets that toward the end of a shift.     - Other concerns:   R shoulder xray 7.2.25:  IMPRESSION: 1. No acute fracture or dislocation. 2. Moderate osteoarthritis of the AC joint -- referred to ortho  Pain Inventory Average Pain 3 Pain Right Now 3 My pain is sharp  In the last 24 hours, has pain interfered with the following? General activity 4 Relation with others 3 Enjoyment of life 3 What TIME of day is your pain at its worst? varies Sleep (in general) NA  Pain is worse with: some activites Pain improves with: rest Relief from Meds: 0  Family History  Problem Relation Age of Onset   Cancer Other        brain cancer   Cancer  Father        skin cancer   Cancer Maternal Grandmother 63       brain cancer   Alcohol  abuse Brother    Alzheimer's disease Paternal Grandmother    Dementia Paternal Grandmother    Heart disease Paternal Grandfather    Social History   Socioeconomic History   Marital status: Married    Spouse name: Not on file   Number of children: Not on file   Years of education: Not on file   Highest education level: Some college, no degree  Occupational History   Not on file  Tobacco Use   Smoking status: Every Day    Current packs/day: 0.00    Types: Cigarettes    Last attempt to quit: 02/25/2011    Years since quitting: 13.0   Smokeless tobacco: Never  Vaping Use   Vaping status: Never Used  Substance and Sexual Activity   Alcohol  use: Not Currently  Alcohol /week: 14.0 standard drinks of alcohol     Types: 14 Cans of beer per week    Comment: quit 11-08-2021   Drug use: No   Sexual activity: Yes    Comment: E-cigarette users  Other Topics Concern   Not on file  Social History Narrative   Not on file   Social Drivers of Health   Financial Resource Strain: Low Risk  (02/27/2024)   Overall Financial Resource Strain (CARDIA)    Difficulty of Paying Living Expenses: Not hard at all  Food Insecurity: Patient Declined (02/27/2024)   Hunger Vital Sign    Worried About Running Out of Food in the Last Year: Patient declined    Ran Out of Food in the Last Year: Patient declined  Transportation Needs: No Transportation Needs (02/27/2024)   PRAPARE - Administrator, Civil Service (Medical): No    Lack of Transportation (Non-Medical): No  Physical Activity: Sufficiently Active (02/27/2024)   Exercise Vital Sign    Days of Exercise per Week: 4 days    Minutes of Exercise per Session: 150+ min  Stress: Stress Concern Present (02/27/2024)   [employer] of Occupational Health - Occupational Stress Questionnaire    Feeling of Stress: To some extent  Social Connections: Socially  Isolated (02/27/2024)   Social Connection and Isolation Panel    Frequency of Communication with Friends and Family: Twice a week    Frequency of Social Gatherings with Friends and Family: Once a week    Attends Religious Services: Never    Database administrator or Organizations: No    Attends Engineer, structural: Not on file    Marital Status: Separated   Past Surgical History:  Procedure Laterality Date   BASAL CELL CARCINOMA EXCISION  1995   BRAIN SURGERY  12/29/2022   CRANIOTOMY FOR TUMOR Right 02/25/2011   IR ANGIO EXTERNAL CAROTID SEL EXT CAROTID BILAT MOD SED  11/16/2021   IR ANGIO INTRA EXTRACRAN SEL COM CAROTID INNOMINATE UNI R MOD SED  11/16/2021   IR ANGIO INTRA EXTRACRAN SEL INTERNAL CAROTID UNI L MOD SED  11/16/2021   IR ANGIO VERTEBRAL SEL VERTEBRAL UNI R MOD SED  11/16/2021   IR ANGIOGRAM FOLLOW UP STUDY  11/17/2021   IR NEURO EACH ADD'L AFTER BASIC UNI LEFT (MS)  11/17/2021   IR NEURO EACH ADD'L AFTER BASIC UNI RIGHT (MS)  11/17/2021   IR TRANSCATH/EMBOLIZ  11/16/2021   IR US  GUIDE VASC ACCESS RIGHT  11/16/2021   RADIOLOGY WITH ANESTHESIA N/A 11/16/2021   Procedure: IR WITH ANESTHESIA;  Surgeon: de Macedo Rodrigues, Katyucia, MD;  Location: [facility] OR;  Service: Radiology;  Laterality: N/A;   TOTAL HIP ARTHROPLASTY Right 12/21/2021   Procedure: RIGHT TOTAL HIP REPLACEMENT;  Surgeon: Jerri Kay HERO, MD;  Location: [facility] OR;  Service: Orthopedics;  Laterality: Right;  3-C   WISDOM TOOTH EXTRACTION     Past Surgical History:  Procedure Laterality Date   BASAL CELL CARCINOMA EXCISION  1995   BRAIN SURGERY  12/29/2022   CRANIOTOMY FOR TUMOR Right 02/25/2011   IR ANGIO EXTERNAL CAROTID SEL EXT CAROTID BILAT MOD SED  11/16/2021   IR ANGIO INTRA EXTRACRAN SEL COM CAROTID INNOMINATE UNI R MOD SED  11/16/2021   IR ANGIO INTRA EXTRACRAN SEL INTERNAL CAROTID UNI L MOD SED  11/16/2021   IR ANGIO VERTEBRAL SEL VERTEBRAL UNI R MOD SED  11/16/2021   IR ANGIOGRAM FOLLOW UP STUDY   11/17/2021   IR NEURO EACH  ADD'L AFTER BASIC UNI LEFT (MS)  11/17/2021   IR NEURO EACH ADD'L AFTER BASIC UNI RIGHT (MS)  11/17/2021   IR TRANSCATH/EMBOLIZ  11/16/2021   IR US  GUIDE VASC ACCESS RIGHT  11/16/2021   RADIOLOGY WITH ANESTHESIA N/A 11/16/2021   Procedure: IR WITH ANESTHESIA;  Surgeon: de Macedo Rodrigues, Katyucia, MD;  Location: [facility] OR;  Service: Radiology;  Laterality: N/A;   TOTAL HIP ARTHROPLASTY Right 12/21/2021   Procedure: RIGHT TOTAL HIP REPLACEMENT;  Surgeon: Jerri Kay HERO, MD;  Location: [facility] OR;  Service: Orthopedics;  Laterality: Right;  3-C   WISDOM TOOTH EXTRACTION     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   Depression    Epistaxis    Headache    Hypertension    PTSD (post-traumatic stress disorder)    Seizures (HCC)    BP 133/88   Pulse 81   Ht 6' 1 (1.854 m)   Wt 167 lb 3.2 oz (75.8 kg)   SpO2 99%   BMI 22.06 kg/m   Opioid Risk Score:   Fall Risk Score:  `1  Depression screen [facility] 2/9     02/28/2024    2:50 PM 12/25/2023   11:37 AM 02/15/2023   11:52 AM 02/28/2022    3:04 PM 10/12/2021   11:04 AM 03/18/2021   11:24 AM 02/18/2021    2:24 PM  Depression screen PHQ 2/9  Decreased Interest 3 1 1 1 1 1 2   Down, Depressed, Hopeless 3 1 1 1 1 1 1   PHQ - 2 Score 6 2 2 2 2 2 3   Altered sleeping 3  1 3 1  0 0  Tired, decreased energy 2  1 1 1  0 3  Change in appetite 0  0 1 0 0 0  Feeling bad or failure about yourself  0  1 1 0 1 2  Trouble concentrating 3  2 2  0 0 1  Moving slowly or fidgety/restless 3  2 1  0 2 2  Suicidal thoughts 0  0 0 0 0 1  PHQ-9 Score 17  9 11 4 5 12   Difficult doing work/chores Somewhat difficult    Somewhat difficult Not difficult at all Somewhat difficult    Review of Systems  Musculoskeletal:        Right shoulder  Psychiatric/Behavioral:  Positive for dysphoric mood.   All other systems reviewed and are  negative.      Objective:   Physical Exam Constitution: Appropriate appearance for age. No apparent distress. Resp: No respiratory distress. No accessory muscle usage. on RA and CTAB Cardio:  LUE delayed capillary refill. RUE WNL.  Abdomen: Nondistended. Nontender.   Psych: Appropriate mood and affect. Neuro: AAOx4. No apparent cognitive deficits. CN 2-12 intact.    Neurologic Exam:   Recalls 3/3 items at 5 minutes.  + Intermittently distracted during interview, looks out the window multiple times Hoffmans: negative b/l Sensory exam:   L sensory deficit distal fingertips; otherwise intact   Motor exam: strength 5/5 throughout bilateral lower extremities, right upper extremity, and with exception of LUE strength 5-/5 grip, 5-/5 FA.   MAS 0 L DIP finger flexors  Coordination: Fine motor coordination was normal LUE and LLE. No apparent FTN ataxia.  No drift. Fine motor intact Gait: normal, no apparent instability VOR testing: NO nystagmus or symptoms with testing or suppression  Assessment & Plan:  Shawn York is a 48 y.o. year old male  who  has a past medical history of Anemia, Avascular necrosis (HCC), Brain cancer (HCC) (02/25/2011), Depression, Epistaxis, Headache, Hypertension, PTSD (post-traumatic stress disorder), and Seizures (HCC).  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 Peggye Li at [facility], complicated by R MCA bleeding with L hemiparesis/hemisensory loss mostly isolated to LUE.   Left hemiparesis (HCC) Anaplastic oligodendroglioma of temporal lobe (HCC)  Take your time with tasks and take breaks as needed.  Follow up in 3 months; gave information to patient on OT driving evaluation for formal assessment.   Spasticity Increase baclofen  to 10 mg twice daily  Hemisensory deficit Continue gabapentin  400 mg three times  daily  You can try using over the counter medical tape around your knuckles as a reminder to help keep your fingers together; you have tried a brace before without benefit.   Cognitive deficits Continue Adderall per Dr. Chalice; please contact his office for refill.  Other orders -     Baclofen ; Take 1 tablet (10 mg total) by mouth 2 (two) times daily.  Dispense: 60 tablet; Refill: 5 -     Gabapentin ; Take 1 capsule (300 mg total) by mouth 3 (three) times daily.  Dispense: 90 capsule; Refill: 5

## 2024-03-25 NOTE — Patient Instructions (Signed)
 Increase baclofen  to 10 mg twice daily  Continue gabapentin  400 mg three times daily  Continue Adderall per Dr. Chalice; please contact his office for refill.  I agree with ortho referral for R AC joint injection; let me know if you cannot gfet in with them within a few weeks and we will get you in here.  You can try using over the counter medical tape around your knuckles ad a reminder to heel you fingers together; you have tried a brace before without benefit.   Take your time with tasks and take breaks as needed.  Follow up in 3 months; if you have further issues with driving we will consider OT driving evaluation for formal assessment.

## 2024-03-26 ENCOUNTER — Encounter: Payer: Self-pay | Admitting: Neurology

## 2024-03-27 ENCOUNTER — Other Ambulatory Visit: Payer: Self-pay | Admitting: Neurology

## 2024-03-27 MED ORDER — AMPHETAMINE-DEXTROAMPHET ER 20 MG PO CP24: 20.0000 mg | ORAL_CAPSULE | Freq: Every day | ORAL | 0 refills | Status: DC

## 2024-03-27 MED ORDER — AMPHETAMINE-DEXTROAMPHETAMINE 10 MG PO TABS: 10.0000 mg | ORAL_TABLET | ORAL | 0 refills | Status: DC | PRN

## 2024-03-27 NOTE — Telephone Encounter (Signed)
 Called the pt and I reviewed and looks like he was never given the XR script. Informed I will resend to MD to refill for pt as he should be taking 20 mg XR in the morning and 10 mg in the afternoon as needed.

## 2024-04-01 ENCOUNTER — Encounter: Payer: Self-pay | Admitting: Physical Medicine and Rehabilitation

## 2024-04-01 DIAGNOSIS — H579 Unspecified disorder of eye and adnexa: Secondary | ICD-10-CM

## 2024-04-01 DIAGNOSIS — C712 Malignant neoplasm of temporal lobe: Secondary | ICD-10-CM

## 2024-04-01 DIAGNOSIS — Z8673 Personal history of transient ischemic attack (TIA), and cerebral infarction without residual deficits: Secondary | ICD-10-CM

## 2024-04-01 DIAGNOSIS — I63431 Cerebral infarction due to embolism of right posterior cerebral artery: Secondary | ICD-10-CM

## 2024-04-04 DIAGNOSIS — H579 Unspecified disorder of eye and adnexa: Secondary | ICD-10-CM | POA: Insufficient documentation

## 2024-04-09 DIAGNOSIS — C712 Malignant neoplasm of temporal lobe: Secondary | ICD-10-CM | POA: Diagnosis not present

## 2024-04-09 DIAGNOSIS — Z9889 Other specified postprocedural states: Secondary | ICD-10-CM | POA: Diagnosis not present

## 2024-04-10 DIAGNOSIS — C712 Malignant neoplasm of temporal lobe: Secondary | ICD-10-CM | POA: Diagnosis not present

## 2024-04-21 ENCOUNTER — Other Ambulatory Visit: Payer: Self-pay | Admitting: Internal Medicine

## 2024-05-07 DIAGNOSIS — C712 Malignant neoplasm of temporal lobe: Secondary | ICD-10-CM | POA: Diagnosis not present

## 2024-05-08 DIAGNOSIS — C712 Malignant neoplasm of temporal lobe: Secondary | ICD-10-CM | POA: Diagnosis not present

## 2024-05-13 ENCOUNTER — Encounter: Payer: Self-pay | Admitting: Neurology

## 2024-05-14 MED ORDER — AMPHETAMINE-DEXTROAMPHETAMINE 10 MG PO TABS: 10.0000 mg | ORAL_TABLET | ORAL | 0 refills | Status: AC | PRN

## 2024-05-14 MED ORDER — AMPHETAMINE-DEXTROAMPHET ER 20 MG PO CP24: 20.0000 mg | ORAL_CAPSULE | Freq: Every day | ORAL | 0 refills | Status: AC

## 2024-05-16 ENCOUNTER — Encounter: Payer: Self-pay | Admitting: Internal Medicine

## 2024-05-22 DIAGNOSIS — M25511 Pain in right shoulder: Secondary | ICD-10-CM | POA: Diagnosis not present

## 2024-06-03 ENCOUNTER — Other Ambulatory Visit: Payer: Self-pay | Admitting: Internal Medicine

## 2024-06-03 DIAGNOSIS — N3941 Urge incontinence: Secondary | ICD-10-CM | POA: Insufficient documentation

## 2024-06-05 DIAGNOSIS — M87052 Idiopathic aseptic necrosis of left femur: Secondary | ICD-10-CM | POA: Diagnosis not present

## 2024-06-05 DIAGNOSIS — C712 Malignant neoplasm of temporal lobe: Secondary | ICD-10-CM | POA: Diagnosis not present

## 2024-06-05 DIAGNOSIS — C719 Malignant neoplasm of brain, unspecified: Secondary | ICD-10-CM | POA: Diagnosis not present

## 2024-06-05 DIAGNOSIS — Z8673 Personal history of transient ischemic attack (TIA), and cerebral infarction without residual deficits: Secondary | ICD-10-CM | POA: Diagnosis not present

## 2024-06-05 DIAGNOSIS — Z8679 Personal history of other diseases of the circulatory system: Secondary | ICD-10-CM | POA: Diagnosis not present

## 2024-06-05 DIAGNOSIS — F32A Depression, unspecified: Secondary | ICD-10-CM | POA: Diagnosis not present

## 2024-06-05 DIAGNOSIS — Z9889 Other specified postprocedural states: Secondary | ICD-10-CM | POA: Diagnosis not present

## 2024-06-05 DIAGNOSIS — Z79899 Other long term (current) drug therapy: Secondary | ICD-10-CM | POA: Diagnosis not present

## 2024-06-05 DIAGNOSIS — M87051 Idiopathic aseptic necrosis of right femur: Secondary | ICD-10-CM | POA: Diagnosis not present

## 2024-06-05 DIAGNOSIS — G40909 Epilepsy, unspecified, not intractable, without status epilepticus: Secondary | ICD-10-CM | POA: Diagnosis not present

## 2024-06-05 DIAGNOSIS — Z923 Personal history of irradiation: Secondary | ICD-10-CM | POA: Diagnosis not present

## 2024-06-05 DIAGNOSIS — I614 Nontraumatic intracerebral hemorrhage in cerebellum: Secondary | ICD-10-CM | POA: Diagnosis not present

## 2024-06-05 DIAGNOSIS — Z87891 Personal history of nicotine dependence: Secondary | ICD-10-CM | POA: Diagnosis not present

## 2024-06-06 ENCOUNTER — Emergency Department (HOSPITAL_BASED_OUTPATIENT_CLINIC_OR_DEPARTMENT_OTHER)
Admission: EM | Admit: 2024-06-06 | Discharge: 2024-06-06 | Disposition: A | Discharge status: Home / Self Care | Attending: Emergency Medicine | Admitting: Emergency Medicine

## 2024-06-06 ENCOUNTER — Emergency Department (HOSPITAL_BASED_OUTPATIENT_CLINIC_OR_DEPARTMENT_OTHER)

## 2024-06-06 ENCOUNTER — Other Ambulatory Visit: Payer: Self-pay

## 2024-06-06 ENCOUNTER — Emergency Department (HOSPITAL_BASED_OUTPATIENT_CLINIC_OR_DEPARTMENT_OTHER): Admitting: Radiology

## 2024-06-06 DIAGNOSIS — Z85841 Personal history of malignant neoplasm of brain: Secondary | ICD-10-CM | POA: Diagnosis not present

## 2024-06-06 DIAGNOSIS — S3013XA Contusion of flank (latus) region, initial encounter: Secondary | ICD-10-CM

## 2024-06-06 DIAGNOSIS — S20212A Contusion of left front wall of thorax, initial encounter: Secondary | ICD-10-CM | POA: Insufficient documentation

## 2024-06-06 DIAGNOSIS — S3011XA Contusion of abdominal wall, initial encounter: Secondary | ICD-10-CM | POA: Diagnosis not present

## 2024-06-06 DIAGNOSIS — Z79899 Other long term (current) drug therapy: Secondary | ICD-10-CM | POA: Diagnosis not present

## 2024-06-06 DIAGNOSIS — W19XXXA Unspecified fall, initial encounter: Secondary | ICD-10-CM | POA: Diagnosis not present

## 2024-06-06 DIAGNOSIS — I10 Essential (primary) hypertension: Secondary | ICD-10-CM | POA: Diagnosis not present

## 2024-06-06 DIAGNOSIS — R0781 Pleurodynia: Secondary | ICD-10-CM | POA: Diagnosis not present

## 2024-06-06 DIAGNOSIS — S3991XA Unspecified injury of abdomen, initial encounter: Secondary | ICD-10-CM | POA: Diagnosis not present

## 2024-06-06 LAB — BASIC METABOLIC PANEL WITH GFR
Anion gap: 13 (ref 5–15)
BUN: 10 mg/dL (ref 6–20)
CO2: 25 mmol/L (ref 22–32)
Calcium: 10.1 mg/dL (ref 8.9–10.3)
Chloride: 101 mmol/L (ref 98–111)
Creatinine, Ser: 0.91 mg/dL (ref 0.61–1.24)
GFR, Estimated: >60 mL/min (ref >60)
Glucose, Bld: 119 mg/dL — ABNORMAL HIGH (ref 70–99)
Potassium: 4.2 mmol/L (ref 3.5–5.1)
Sodium: 138 mmol/L (ref 135–145)

## 2024-06-06 LAB — CBC
HCT: 43.6 % (ref 39.0–52.0)
Hemoglobin: 15.3 g/dL (ref 13.0–17.0)
MCH: 33.3 pg (ref 26.0–34.0)
MCHC: 35.1 g/dL (ref 30.0–36.0)
MCV: 95 fL (ref 80.0–100.0)
Platelets: 202 K/uL (ref 150–400)
RBC: 4.59 MIL/uL (ref 4.22–5.81)
RDW: 12.8 % (ref 11.5–15.5)
WBC: 8.6 K/uL (ref 4.0–10.5)
nRBC: 0 % (ref 0.0–0.2)

## 2024-06-06 MED ORDER — OXYCODONE HCL 5 MG PO TABS: 5.0000 mg | ORAL_TABLET | Freq: Four times a day (QID) | ORAL | 0 refills | Status: DC | PRN

## 2024-06-06 NOTE — ED Provider Notes (Signed)
 [place] EMERGENCY DEPARTMENT AT [facility] Provider Note   CSN: [id number] Arrival date & time: 06/06/24  8084     Patient presents with: Shawn York   Shawn York is a 48 y.o. male.    Fall   Patient has history of seizures hypertension brain cancer who presents ED for evaluation after fall.  Patient has history of partial left-sided hemiparesis related to his prior brain surgery.  He had a fall in the shower today landing on a sharp object on his left flank.  Patient states has been having pain in his rib area and flank area.  It hurts to take a deep breath.  He has also had some pain in his abdomen associated with this    Prior to Admission medications   Medication Sig Start Date End Date Taking? Authorizing Provider  oxyCODONE  (ROXICODONE ) 5 MG immediate release tablet Take 1 tablet (5 mg total) by mouth every 6 (six) hours as needed for severe pain (pain score 7-10). 06/06/24  Yes Randol Simmonds, MD  amphetamine -dextroamphetamine  (ADDERALL XR) 20 MG 24 hr capsule Take 1 capsule (20 mg total) by mouth daily. Before breakfast po. 05/14/24   Gregg Lek, MD  amphetamine -dextroamphetamine  (ADDERALL) 10 MG tablet Take 1 tablet (10 mg total) by mouth as needed. Please use this medication no later than 2 pm as needed for alertness and focus. 05/14/24   Gregg Lek, MD  b complex vitamins capsule Take 1 capsule by mouth daily.    [provider]  baclofen  (LIORESAL ) 10 MG tablet Take 1 tablet (10 mg total) by mouth 2 (two) times daily. 03/25/24   Emeline Joesph BROCKS, DO  busPIRone  (BUSPAR ) 30 MG tablet Take 1 tablet (30 mg total) by mouth 2 (two) times daily. 06/23/23 03/25/24  Franchot Harlene SQUIBB, PMHNP  escitalopram  (LEXAPRO ) 10 MG tablet Take 1 tablet (10 mg total) by mouth daily. 08/19/23   Franchot Harlene SQUIBB, PMHNP  gabapentin  (NEURONTIN ) 300 MG capsule Take 1 capsule (300 mg total) by mouth 3 (three) times daily. 03/25/24   Emeline Joesph C, DO  levocetirizine (XYZAL ) 5 MG  tablet TAKE 1 TABLET BY MOUTH EVERY DAY IN THE EVENING 04/22/24   Joshua Debby CROME, MD  ondansetron  (ZOFRAN ) 8 MG tablet Take by mouth. 12/17/23   [provider]  Oxcarbazepine  (TRILEPTAL ) 300 MG tablet Take 1 tablet (300 mg total) by mouth 2 (two) times daily. 02/08/23   Dohmeier, Dedra, MD  temozolomide (TEMODAR) 100 MG capsule Take 100 mg by mouth daily. 5 days on 28 days off Patient taking differently: Take 300 mg by mouth daily. 5 days on 28 days off 06/30/23   [provider]  temozolomide (TEMODAR) 250 MG capsule Take 250 mg by mouth daily. 5 days on and 28 days off 05/05/23   [provider]    Allergies: Iodinated contrast media, Penicillins, Shellfish allergy, and Shellfish protein-containing drug products    Review of Systems  Updated Vital Signs BP (!) 124/97   Pulse 83   Temp 98.1 F (36.7 C)   Resp 15   Ht 1.854 m (6' 1)   Wt 73.9 kg   SpO2 100%   BMI 21.51 kg/m   Physical Exam Vitals and nursing note reviewed.  Constitutional:      General: He is not in acute distress.    Appearance: He is well-developed.  HENT:     Head: Normocephalic and atraumatic.     Right Ear: External ear normal.     Left  Ear: External ear normal.  Eyes:     General: No scleral icterus.       Right eye: No discharge.        Left eye: No discharge.     Conjunctiva/sclera: Conjunctivae normal.  Neck:     Trachea: No tracheal deviation.  Cardiovascular:     Rate and Rhythm: Normal rate and regular rhythm.  Pulmonary:     Effort: Pulmonary effort is normal. No respiratory distress.     Breath sounds: Normal breath sounds. No stridor. No wheezing or rales.  Chest:     Comments: Bruising and ecchymoses tender to palpation lower left rib margin Abdominal:     General: Bowel sounds are normal. There is no distension.     Palpations: Abdomen is soft.     Tenderness: There is no abdominal tenderness. There is no guarding or rebound.     Comments: Ecchymoses noted  in the left abdomen flank area, tenderness palpation of those areas  Musculoskeletal:        General: No tenderness or deformity.     Cervical back: Neck supple.  Skin:    General: Skin is warm and dry.     Findings: No rash.  Neurological:     General: No focal deficit present.     Mental Status: He is alert.     Cranial Nerves: No cranial nerve deficit, dysarthria or facial asymmetry.     Sensory: No sensory deficit.     Motor: No abnormal muscle tone or seizure activity.     Coordination: Coordination normal.  Psychiatric:        Mood and Affect: Mood normal.     (all labs ordered are listed, but only abnormal results are displayed) Labs Reviewed  BASIC METABOLIC PANEL WITH GFR - Abnormal; Notable for the following components:      Result Value   Glucose, Bld 119 (*)    All other components within normal limits  CBC    EKG: None  Radiology: DG Ribs Unilateral W/Chest Left Result Date: 06/06/2024 CLINICAL DATA:  fall, pain EXAM: LEFT RIBS AND CHEST - 3+ VIEW COMPARISON:  CT chest 02/14/2023, chest x-ray 05/12/2023 FINDINGS: The heart and mediastinal contours are within normal limits. Chronic coarsened interstitial markings. No focal consolidation. No pulmonary edema. No pleural effusion. No pneumothorax. No acute osseous abnormality. BB marker overlies the left lower chest wall. No acute displaced fracture or other bone lesions are seen involving the left ribs. IMPRESSION: 1. No acute cardiopulmonary abnormality. 2. No acute displaced left rib fracture. Please note, nondisplaced rib fractures may be occult on radiograph. Electronically Signed   By: Morgane  Naveau M.D.   On: 06/06/2024 20:47   CT ABDOMEN PELVIS WO CONTRAST Result Date: 06/06/2024 CLINICAL DATA:  Fall yesterday with left-sided chest and abdominal pain, initial encounter EXAM: CT ABDOMEN AND PELVIS WITHOUT CONTRAST TECHNIQUE: Multidetector CT imaging of the abdomen and pelvis was performed following the standard  protocol without IV contrast. RADIATION DOSE REDUCTION: This exam was performed according to the departmental dose-optimization program which includes automated exposure control, adjustment of the mA and/or kV according to patient size and/or use of iterative reconstruction technique. COMPARISON:  None Available. FINDINGS: Lower chest: No acute abnormality. Hepatobiliary: No focal liver abnormality is seen. No gallstones, gallbladder wall thickening, or biliary dilatation. Pancreas: Unremarkable. No pancreatic ductal dilatation or surrounding inflammatory changes. Spleen: Spleen is within normal limits. No perisplenic fluid is noted. Adrenals/Urinary Tract: Adrenal glands are unremarkable. Kidneys are well  visualized bilaterally. Punctate nonobstructing stones are seen bilaterally. No obstructive changes noted. The bladder is within normal limits. Stomach/Bowel: No obstructive or inflammatory changes of the colon are noted. Appendix is within normal limits. Small bowel and stomach are unremarkable. Vascular/Lymphatic: Aortic atherosclerosis. No enlarged abdominal or pelvic lymph nodes. Reproductive: Prostate is unremarkable. Other: No abdominal wall hernia or abnormality. No abdominopelvic ascites. Musculoskeletal: Right hip prosthesis is noted. Degenerative changes of the lumbar spine are noted. Bilateral L5 pars defects seen. No definitive rib abnormality is seen. Changes of avascular necrosis are noted in the left hip. IMPRESSION: Punctate nonobstructing calculi without acute abnormality. No acute rib abnormality noted. Electronically Signed   By: Oneil Devonshire M.D.   On: 06/06/2024 20:41     Procedures   Medications Ordered in the ED - No data to display  Clinical Course as of 06/06/24 2121  Thu Jun 06, 2024  2104 CBC and metabolic panel unremarkable [JK]  2104 Chest x-ray without acute abnormality [JK]  2104 CT scan without acute abnormality [JK]    Clinical Course User Index [JK] Randol Simmonds, MD                                  Medical Decision Making Problems Addressed: Contusion of flank, initial encounter: acute illness or injury that poses a threat to life or bodily functions  Amount and/or Complexity of Data Reviewed Labs: ordered. Decision-making details documented in ED Course. Radiology: ordered and independent interpretation performed.  Risk Prescription drug management.   Patient presented to the ED for evaluation after a fall.  Patient had obvious bruising in the left lower chest wall flank area.  Was concerned about the possibility of rib fractures pneumothorax splenic injury.  Patient's chest x-ray did not show any acute abnormality.  Patient has a contrast allergy so he received a noncontrast abdominal CT scan.  No obvious injury noted on the scan.  Patient appears to have soft tissue contusion but no signs of serious injury.  Evaluation and diagnostic testing in the emergency department does not suggest an emergent condition requiring admission or immediate intervention beyond what has been performed at this time.  The patient is safe for discharge and has been instructed to return immediately for worsening symptoms, change in symptoms or any other concerns.    Final diagnoses:  Contusion of flank, initial encounter    ED Discharge Orders          Ordered    oxyCODONE  (ROXICODONE ) 5 MG immediate release tablet  Every 6 hours PRN        06/06/24 2120               Randol Simmonds, MD 06/06/24 2122

## 2024-06-06 NOTE — ED Notes (Addendum)
 Patient transported to CT

## 2024-06-06 NOTE — ED Notes (Signed)
 Reviewed discharge instructions, medications, and home care with pt. Pt verbalized understanding and had no further questions. Pt exited ED without complications.

## 2024-06-06 NOTE — ED Triage Notes (Signed)
 Pt POV reporting L rib pain after mechanical fall while in shower last night. Did not hit head, no LOC, no blood thinners.

## 2024-06-06 NOTE — Discharge Instructions (Addendum)
 Take Tylenol  to help with your aches and pains associated with your injury.  You can use the oxycodone  for more severe pain

## 2024-06-23 ENCOUNTER — Other Ambulatory Visit: Payer: Self-pay | Admitting: Medical Genetics

## 2024-06-23 DIAGNOSIS — Z006 Encounter for examination for normal comparison and control in clinical research program: Secondary | ICD-10-CM

## 2024-06-26 ENCOUNTER — Encounter: Attending: Physical Medicine and Rehabilitation | Admitting: Physical Medicine and Rehabilitation

## 2024-06-26 VITALS — BP 154/103 | HR 63 | Ht 73.0 in | Wt 164.0 lb

## 2024-06-26 DIAGNOSIS — M25811 Other specified joint disorders, right shoulder: Secondary | ICD-10-CM | POA: Insufficient documentation

## 2024-06-26 DIAGNOSIS — C712 Malignant neoplasm of temporal lobe: Secondary | ICD-10-CM | POA: Diagnosis not present

## 2024-06-26 DIAGNOSIS — F341 Dysthymic disorder: Secondary | ICD-10-CM | POA: Diagnosis not present

## 2024-06-26 DIAGNOSIS — M792 Neuralgia and neuritis, unspecified: Secondary | ICD-10-CM | POA: Insufficient documentation

## 2024-06-26 DIAGNOSIS — R252 Cramp and spasm: Secondary | ICD-10-CM | POA: Insufficient documentation

## 2024-06-26 DIAGNOSIS — G8194 Hemiplegia, unspecified affecting left nondominant side: Secondary | ICD-10-CM | POA: Insufficient documentation

## 2024-06-26 DIAGNOSIS — R29818 Other symptoms and signs involving the nervous system: Secondary | ICD-10-CM | POA: Diagnosis not present

## 2024-06-26 MED ORDER — BACLOFEN 10 MG PO TABS: ORAL_TABLET | ORAL | Status: AC

## 2024-06-26 MED ORDER — GABAPENTIN 300 MG PO CAPS: ORAL_CAPSULE | ORAL | Status: AC

## 2024-06-26 NOTE — Progress Notes (Addendum)
 Subjective:    Patient ID: Shawn York, male    DOB: 1975/12/10, 48 y.o.   MRN: [id number]  HPI   Shawn York is a 48 y.o. year old male  who  has a past medical history of Anemia, Avascular necrosis (HCC), Brain cancer (HCC) (02/25/2011), Depression, Epistaxis, Headache, Hypertension, PTSD (post-traumatic stress disorder), and Seizures (HCC).  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 Peggye Li at [facility], complicated by R MCA bleeding with L hemiparesis/hemisensory loss mostly isolated to LUE.    Plan from last visit: Left hemiparesis [facility]) Anaplastic oligodendroglioma of temporal lobe (HCC)   Take your time with tasks and take breaks as needed.   Follow up in 3 months; gave information to patient on OT driving evaluation for formal assessment.    Spasticity Increase baclofen  to 10 mg twice daily   Hemisensory deficit Continue gabapentin  400 mg three times daily   You can try using over the counter medical tape around your knuckles as a reminder to help keep your fingers together; you have tried a brace before without benefit.    Cognitive deficits Continue Adderall per Dr. Chalice; please contact his office for refill.   Other orders -     Baclofen ; Take 1 tablet (10 mg total) by mouth 2 (two) times daily.  Dispense: 60 tablet; Refill: 5 -     Gabapentin ; Take 1 capsule (300 mg total) by mouth 3 (three) times daily.  Dispense: 90 capsule; Refill: 5     Interval Hx:  Discussed the use of AI scribe software for clinical note transcription with the patient, who gave verbal consent to proceed.  History of Present Illness   Shawn York is a 48 year old male with oligodendroma of the temporal lobe status post resection who presents for follow-up related to residual left hemiparesis, spasticity, and cognitive deficits.  Residual left  hemiparesis and spasticity persist following the resection of the temporal lobe oligodendroma. He experiences significant pain on the left side, especially during movements like crunching, with no fractures or additional bleeds noted after a recent fall. Cognitive deficits continue to affect his daily functioning, and he is reestablishing care with neuropsychology for further management.  He experiences clumsiness and altered sensation in his left hand, described as a sensation similar to 'Vicks' spread across the palm. 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 Gabapentin  is prescribed but not taken consistently, though the sensation is improving.  Early morning spasms, particularly in the calf muscles, are painful. Baclofen  is prescribed at 10 mg twice daily but is taken inconsistently, used as needed. Spasms are more frequent in the early morning.  Bilateral hip pain is present due to avascular necrosis, with a right hip replacement performed in 2023. Right shoulder pain persists despite temporary relief from a corticosteroid injection. He has not pursued physical therapy for the shoulder, and work-related overuse may be contributing to the pain.  Socially, he is experiencing stress due to his wife's consideration of divorce and feels a lack of support since his hospitalization. He has children who are reportedly doing well.         Pain Inventory Average Pain 0 Pain Right Now 0 My pain is na  In the last 24 hours, has pain interfered with the following? General activity 0 Relation with others 0 Enjoyment of life 0 What TIME of day  is your pain at its worst? na Sleep (in general) Fair  Pain is worse with: na Pain improves with: na Relief from Meds: na  Family History  Problem Relation Age of Onset   Cancer Other        brain cancer   Cancer Father        skin cancer   Cancer Maternal Grandmother 56       brain cancer   Alcohol  abuse  Brother    Alzheimer's disease Paternal Grandmother    Dementia Paternal Grandmother    Heart disease Paternal Grandfather    Social History   Socioeconomic History   Marital status: Married    Spouse name: Not on file   Number of children: Not on file   Years of education: Not on file   Highest education level: Some college, no degree  Occupational History   Not on file  Tobacco Use   Smoking status: Every Day    Current packs/day: 0.00    Types: Cigarettes    Last attempt to quit: 02/25/2011    Years since quitting: 13.3   Smokeless tobacco: Never  Vaping Use   Vaping status: Never Used  Substance and Sexual Activity   Alcohol  use: Not Currently    Alcohol /week: 14.0 standard drinks of alcohol     Types: 14 Cans of beer per week    Comment: quit 11-08-2021   Drug use: No   Sexual activity: Yes    Comment: E-cigarette users  Other Topics Concern   Not on file  Social History Narrative   Not on file   Social Drivers of Health   Financial Resource Strain: Low Risk  (02/27/2024)   Overall Financial Resource Strain (CARDIA)    Difficulty of Paying Living Expenses: Not hard at all  Food Insecurity: Patient Declined (02/27/2024)   Hunger Vital Sign    Worried About Running Out of Food in the Last Year: Patient declined    Ran Out of Food in the Last Year: Patient declined  Transportation Needs: No Transportation Needs (02/27/2024)   PRAPARE - Administrator, Civil Service (Medical): No    Lack of Transportation (Non-Medical): No  Physical Activity: Sufficiently Active (02/27/2024)   Exercise Vital Sign    Days of Exercise per Week: 4 days    Minutes of Exercise per Session: 150+ min  Stress: Stress Concern Present (02/27/2024)   [employer] of Occupational Health - Occupational Stress Questionnaire    Feeling of Stress: To some extent  Social Connections: Socially Isolated (02/27/2024)   Social Connection and Isolation Panel    Frequency of Communication with  Friends and Family: Twice a week    Frequency of Social Gatherings with Friends and Family: Once a week    Attends Religious Services: Never    Database Administrator or Organizations: No    Attends Engineer, Structural: Not on file    Marital Status: Separated   Past Surgical History:  Procedure Laterality Date   BASAL CELL CARCINOMA EXCISION  1995   BRAIN SURGERY  12/29/2022   CRANIOTOMY FOR TUMOR Right 02/25/2011   IR ANGIO EXTERNAL CAROTID SEL EXT CAROTID BILAT MOD SED  11/16/2021   IR ANGIO INTRA EXTRACRAN SEL COM CAROTID INNOMINATE UNI R MOD SED  11/16/2021   IR ANGIO INTRA EXTRACRAN SEL INTERNAL CAROTID UNI L MOD SED  11/16/2021   IR ANGIO VERTEBRAL SEL VERTEBRAL UNI R MOD SED  11/16/2021   IR ANGIOGRAM FOLLOW  UP STUDY  11/17/2021   IR NEURO EACH ADD'L AFTER BASIC UNI LEFT (MS)  11/17/2021   IR NEURO EACH ADD'L AFTER BASIC UNI RIGHT (MS)  11/17/2021   IR TRANSCATH/EMBOLIZ  11/16/2021   IR US  GUIDE VASC ACCESS RIGHT  11/16/2021   RADIOLOGY WITH ANESTHESIA N/A 11/16/2021   Procedure: IR WITH ANESTHESIA;  Surgeon: de Macedo Rodrigues, Katyucia, MD;  Location: [facility] OR;  Service: Radiology;  Laterality: N/A;   TOTAL HIP ARTHROPLASTY Right 12/21/2021   Procedure: RIGHT TOTAL HIP REPLACEMENT;  Surgeon: Jerri Kay HERO, MD;  Location: [facility] OR;  Service: Orthopedics;  Laterality: Right;  3-C   WISDOM TOOTH EXTRACTION     Past Surgical History:  Procedure Laterality Date   BASAL CELL CARCINOMA EXCISION  1995   BRAIN SURGERY  12/29/2022   CRANIOTOMY FOR TUMOR Right 02/25/2011   IR ANGIO EXTERNAL CAROTID SEL EXT CAROTID BILAT MOD SED  11/16/2021   IR ANGIO INTRA EXTRACRAN SEL COM CAROTID INNOMINATE UNI R MOD SED  11/16/2021   IR ANGIO INTRA EXTRACRAN SEL INTERNAL CAROTID UNI L MOD SED  11/16/2021   IR ANGIO VERTEBRAL SEL VERTEBRAL UNI R MOD SED  11/16/2021   IR ANGIOGRAM FOLLOW UP STUDY  11/17/2021   IR NEURO EACH ADD'L AFTER BASIC UNI LEFT (MS)  11/17/2021   IR NEURO EACH ADD'L  AFTER BASIC UNI RIGHT (MS)  11/17/2021   IR TRANSCATH/EMBOLIZ  11/16/2021   IR US  GUIDE VASC ACCESS RIGHT  11/16/2021   RADIOLOGY WITH ANESTHESIA N/A 11/16/2021   Procedure: IR WITH ANESTHESIA;  Surgeon: de Macedo Rodrigues, Katyucia, MD;  Location: [facility] OR;  Service: Radiology;  Laterality: N/A;   TOTAL HIP ARTHROPLASTY Right 12/21/2021   Procedure: RIGHT TOTAL HIP REPLACEMENT;  Surgeon: Jerri Kay HERO, MD;  Location: [facility] OR;  Service: Orthopedics;  Laterality: Right;  3-C   WISDOM TOOTH EXTRACTION     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   Depression    Epistaxis    Headache    Hypertension    PTSD (post-traumatic stress disorder)    Seizures (HCC)    BP (!) 151/105   Pulse 63   Ht 6' 1 (1.854 m)   Wt 164 lb (74.4 kg)   SpO2 98%   BMI 21.64 kg/m   Opioid Risk Score:   Fall Risk Score:  `1  Depression screen [facility] 2/9     02/28/2024    2:50 PM 12/25/2023   11:37 AM 02/15/2023   11:52 AM 02/28/2022    3:04 PM 10/12/2021   11:04 AM 03/18/2021   11:24 AM 02/18/2021    2:24 PM  Depression screen PHQ 2/9  Decreased Interest 3 1 1 1 1 1 2   Down, Depressed, Hopeless 3 1 1 1 1 1 1   PHQ - 2 Score 6 2 2 2 2 2 3   Altered sleeping 3  1 3 1  0 0  Tired, decreased energy 2  1 1 1  0 3  Change in appetite 0  0 1 0 0 0  Feeling bad or failure about yourself  0  1 1 0 1 2  Trouble concentrating 3  2 2  0 0 1  Moving slowly or fidgety/restless 3  2 1  0 2 2  Suicidal thoughts 0  0 0 0 0 1  PHQ-9 Score 17  9 11 4 5  12  Difficult doing work/chores Somewhat difficult    Somewhat difficult Not difficult at all Somewhat difficult     Review of Systems     Objective:   Physical Exam  Physical Exam Constitution: Appropriate appearance for age. No apparent distress. Resp: No respiratory distress. No accessory muscle usage. on RA and CTAB Cardio: Well perfused appearance.  Abdomen:  Nondistended. Nontender.   Psych: Appropriate mood and affect. Neuro: AAOx4. No apparent cognitive deficits. CN 2-12 intact.    Neurologic Exam:  AAOx4.   + Intermittently distracted during interview  Hoffmans: negative b/l Sensory exam:   L sensory deficit distal fingertips; otherwise intact   Motor exam: strength 5/5 throughout bilateral lower extremities, right upper extremity, and with exception of LUE strength 5-/5 grip, 5-/5 FA.   MAS 0 L DIP finger flexors  Coordination: Fine motor coordination was reduced in LUE >  LLE. +t FTN ataxia.  No drift. Fine motor intact. + LUE drift Gait: normal, no apparent instability  R shoulder: TTP Ac joint. Pain with abduction, internal rotaiton. + Hawkins, Empty can    Assessment & Plan:   They are presenting to PM&R clinic for follow up related to IPR admission 5/10-5/22/24 s/p right pterional craniotomy with right temporal oligodendroglioma resection 12/29/22 with Dr. Peggye Li at [facility] complicated by R MCA bleeding with L hemiparesis/hemisensory loss mostly isolated to LUE.   Assessment and Plan    Oligodendroglioma of right temporal lobe, status post resection with residual left hemiparesis, spasticity, hemisensory and cognitive deficits Residual deficits include left hemiparesis, spasticity, hemisensory, and cognitive issues. Clumsiness and coordination difficulties in the left hand with hypersensitivity to temperature noted. Gabapentin  used for hand discomfort, baclofen  inconsistently for spasticity. - Refer to physical therapy for left hand coordination and strengthening. - Advise baclofen  at bedtime for early morning spasms. - Suggest magnesium  supplement at night for spasms, cautioning about gut issues.  - Move gabapentin  to PRN - Follow up in 6 months  Recent right temporal-parietal intracerebral hemorrhage Right temporal-parietal hemorrhage post-fall with persistent left-sided pain. No new falls, no fractures or additional  bleeds, mild bruising. - Repeat MRI in 4-6 weeks as planned by oncology.  Seizure disorder Seizure disorder controlled with Trileptal . Seizures reported with children in the car. Ritalin managed by neurology, Adderall not taken pending follow-up. - Follow up with neurology for potential renewal of neurostimulant prescription.  Depression Depression managed with Buspar . Rexulti  discontinued. Reestablishing care with neuropsychology at [facility]. Reports personal stressors including potential divorce and lack of spousal support. - Continue Buspar  for depression. - Reestablish care with neuropsychology at [facility].  Avascular necrosis of bilateral hips, status post right hip replacement Persistent bilateral hip pain with avascular necrosis and right hip replacement in 2023.  Right shoulder impingement syndrome with pain Right shoulder pain due to impingement syndrome. Previous corticosteroid injection provided temporary relief. Pain and clicking during overhead activities. - Refer to physical therapy for right shoulder impingement syndrome.  Chronic left-sided pain and bruising after fall Chronic left-sided pain and bruising post-fall, exacerbated by certain movements. Oxycodone  course completed.

## 2024-06-26 NOTE — Patient Instructions (Addendum)
  VISIT SUMMARY: You had a follow-up visit to address ongoing issues related to your previous brain surgery, including left-sided weakness, spasticity, and cognitive deficits. We also discussed  shoulder pain, falls, and depression. You are experiencing stress due to personal issues, and we have made several referrals and adjustments to your treatment plan.  YOUR PLAN: OLIGODENDROGLIOMA OF RIGHT TEMPORAL LOBE, STATUS POST RESECTION WITH RESIDUAL LEFT HEMIPARESIS, SPASTICITY, HEMISENSORY AND COGNITIVE DEFICITS: You have ongoing issues with left-sided weakness, spasticity, and cognitive deficits following your brain surgery. -You will be referred to physical therapy to help with coordination and strengthening of your left hand. -Take baclofen  at bedtime to help with early morning spasms. - Use gabapentin  as needed for hand discomfort -Consider taking a magnesium  supplement at night to help with spasms, but be aware it may cause GI upset    RECENT RIGHT TEMPORAL-PARIETAL INTRACEREBRAL HEMORRHAGE: You had a recent brain bleed with persistent left-sided pain but no new falls or additional bleeds. -A repeat MRI will be done in 4-6 weeks as planned by your oncology team.  SEIZURE DISORDER: Your seizures are currently controlled with Trileptal , but you have experienced seizures while driving with your children. -Follow up with neurology to renew of your neurostimulant prescription.  DEPRESSION: You are experiencing depression, which is currently managed with Buspar . You are also dealing with personal stressors, including potential divorce and lack of spousal support. -Continue taking Buspar  for depression. -Reestablish care with neuropsychology at [facility] for further support.  RIGHT SHOULDER IMPINGEMENT SYNDROME WITH PAIN: You have right shoulder pain and clicking, especially during overhead activities. -You will be referred to physical therapy for your right shoulder impingement syndrome. - Failed  steroid injection

## 2024-07-03 DIAGNOSIS — R4189 Other symptoms and signs involving cognitive functions and awareness: Secondary | ICD-10-CM | POA: Diagnosis not present

## 2024-07-03 DIAGNOSIS — C712 Malignant neoplasm of temporal lobe: Secondary | ICD-10-CM | POA: Diagnosis not present

## 2024-07-03 DIAGNOSIS — I619 Nontraumatic intracerebral hemorrhage, unspecified: Secondary | ICD-10-CM | POA: Diagnosis not present

## 2024-07-03 DIAGNOSIS — Z87891 Personal history of nicotine dependence: Secondary | ICD-10-CM | POA: Diagnosis not present

## 2024-07-07 ENCOUNTER — Emergency Department (HOSPITAL_BASED_OUTPATIENT_CLINIC_OR_DEPARTMENT_OTHER)
Admission: EM | Admit: 2024-07-07 | Discharge: 2024-07-07 | Disposition: A | Discharge status: Home / Self Care | Attending: Emergency Medicine | Admitting: Emergency Medicine

## 2024-07-07 ENCOUNTER — Other Ambulatory Visit: Payer: Self-pay

## 2024-07-07 ENCOUNTER — Encounter (HOSPITAL_BASED_OUTPATIENT_CLINIC_OR_DEPARTMENT_OTHER): Payer: Self-pay

## 2024-07-07 DIAGNOSIS — Z79899 Other long term (current) drug therapy: Secondary | ICD-10-CM | POA: Insufficient documentation

## 2024-07-07 DIAGNOSIS — R040 Epistaxis: Secondary | ICD-10-CM | POA: Diagnosis not present

## 2024-07-07 DIAGNOSIS — I10 Essential (primary) hypertension: Secondary | ICD-10-CM | POA: Diagnosis not present

## 2024-07-07 LAB — CBC
HCT: 41.5 % (ref 39.0–52.0)
Hemoglobin: 14.7 g/dL (ref 13.0–17.0)
MCH: 33.7 pg (ref 26.0–34.0)
MCHC: 35.4 g/dL (ref 30.0–36.0)
MCV: 95.2 fL (ref 80.0–100.0)
Platelets: 164 K/uL (ref 150–400)
RBC: 4.36 MIL/uL (ref 4.22–5.81)
RDW: 12.8 % (ref 11.5–15.5)
WBC: 6.5 K/uL (ref 4.0–10.5)
nRBC: 0 % (ref 0.0–0.2)

## 2024-07-07 MED ORDER — OXYMETAZOLINE HCL 0.05 % NA SOLN
1.0000 | Freq: Once | NASAL | Status: AC
  Administered 2024-07-07: 1 via NASAL
  Filled 2024-07-07: qty 30

## 2024-07-07 MED ORDER — TRANEXAMIC ACID 1000 MG/10ML IV SOLN
500.0000 mg | Freq: Once | INTRAVENOUS | Status: AC
  Administered 2024-07-07: 500 mg via TOPICAL
  Filled 2024-07-07: qty 10

## 2024-07-07 NOTE — ED Provider Notes (Signed)
  EMERGENCY DEPARTMENT AT [facility] Provider Note   CSN: [id number] Arrival date & time: 07/07/24  1709     Patient presents with: Epistaxis (/)   Gwynn Crossley is a 48 y.o. male.  48 year old male presents ED with complaints of left sided nosebleed for approximately 1 hour.  Patient reports he has had a nosebleed before that has lasted a week and he had to get it surgically cauterized by ENT.  Patient reports he was recently taken off hypertension medication and he is in the monitoring phase of glioma.  He reports his last treatment was during the summer.  Patient denies any other associated symptoms including headaches, dizziness, lightheadedness, or visual disturbances.  Patient denies any blood thinners.      Prior to Admission medications   Medication Sig Start Date End Date Taking? Authorizing Provider  amphetamine -dextroamphetamine  (ADDERALL XR) 20 MG 24 hr capsule Take 1 capsule (20 mg total) by mouth daily. Before breakfast po. 05/14/24   Gregg Lek, MD  amphetamine -dextroamphetamine  (ADDERALL) 10 MG tablet Take 1 tablet (10 mg total) by mouth as needed. Please use this medication no later than 2 pm as needed for alertness and focus. 05/14/24   Gregg Lek, MD  b complex vitamins capsule Take 1 capsule by mouth daily.    [provider]  baclofen  (LIORESAL ) 10 MG tablet Nightly and as needed for cramping / spasms 06/26/24   Emeline Search C, DO  busPIRone  (BUSPAR ) 30 MG tablet Take 1 tablet (30 mg total) by mouth 2 (two) times daily. 06/23/23 03/25/24  Franchot Harlene SQUIBB, PMHNP  escitalopram  (LEXAPRO ) 10 MG tablet Take 1 tablet (10 mg total) by mouth daily. 08/19/23   Franchot Harlene SQUIBB, PMHNP  gabapentin  (NEURONTIN ) 300 MG capsule As needed up to 3 times daily for hand paresthesias 06/26/24   Emeline Search C, DO  levocetirizine (XYZAL ) 5 MG tablet TAKE 1 TABLET BY MOUTH EVERY DAY IN THE EVENING 04/22/24   Chika Cichowski Debby CROME, MD  ondansetron  (ZOFRAN ) 8 MG  tablet Take by mouth. 12/17/23   [provider]  Oxcarbazepine  (TRILEPTAL ) 300 MG tablet Take 1 tablet (300 mg total) by mouth 2 (two) times daily. 02/08/23   Dohmeier, Dedra, MD  temozolomide (TEMODAR) 100 MG capsule Take 100 mg by mouth daily. 5 days on 28 days off Patient taking differently: Take 300 mg by mouth daily. 5 days on 28 days off 06/30/23   [provider]  temozolomide (TEMODAR) 250 MG capsule Take 250 mg by mouth daily. 5 days on and 28 days off 05/05/23   [provider]    Allergies: Iodinated contrast media, Penicillins, Shellfish allergy, and Shellfish protein-containing drug products    Review of Systems  HENT:  Positive for nosebleeds.   All other systems reviewed and are negative.   Updated Vital Signs BP (!) 153/111   Pulse 77   Temp 97.8 F (36.6 C) (Temporal)   Resp 18   Ht 6' (1.829 m)   Wt 74.8 kg   SpO2 99%   BMI 22.38 kg/m   Physical Exam Vitals and nursing note reviewed.  Constitutional:      Appearance: Normal appearance.  HENT:     Head: Normocephalic and atraumatic.     Comments: Right nare is unremarkable.  Left nare has significant amount of active bleeding.  There is some blood noted in oropharynx.  Patient has spit up several clots during physical exam.    Nose: Nose normal.  Eyes:  Extraocular Movements: Extraocular movements intact.     Conjunctiva/sclera: Conjunctivae normal.     Pupils: Pupils are equal, round, and reactive to light.  Cardiovascular:     Rate and Rhythm: Normal rate.     Heart sounds: Normal heart sounds.  Pulmonary:     Effort: Pulmonary effort is normal. No respiratory distress.     Breath sounds: Normal breath sounds.  Abdominal:     General: Abdomen is flat.     Palpations: Abdomen is soft.     Tenderness: There is no abdominal tenderness. There is no guarding.  Musculoskeletal:        General: Normal range of motion.     Cervical back: Normal range of motion.  Skin:     General: Skin is warm.     Capillary Refill: Capillary refill takes less than 2 seconds.     Coloration: Skin is not pale.  Neurological:     General: No focal deficit present.     Mental Status: He is alert.  Psychiatric:        Mood and Affect: Mood normal.        Behavior: Behavior normal.     (all labs ordered are listed, but only abnormal results are displayed) Labs Reviewed  CBC    EKG: None  Radiology: No results found.   Procedures   Medications Ordered in the ED  oxymetazoline  (AFRIN) 0.05 % nasal spray 1 spray (1 spray Each Nare Given 07/07/24 1720)  tranexamic acid  (CYKLOKAPRON ) injection 500 mg (500 mg Topical Given by Other 07/07/24 1840)   48 y.o. male presents to the ED with complaints of nosebleed, The differential diagnosis includes epitaxis, anemia (Ddx)  On arrival pt is nontoxic, vitals remarkable for hypertension. Exam significant for bleeding from the left nare.  Additional history obtained from chart review with several visits in March 2023 for epistaxis.  Patient eventually had to have cauterization surgically by ENT.  I ordered medication tranexamic acid  for nosebleed. Afrin ordered in triage and administered in triage.  Lab Tests:  CBC ordered in triage with no remarkable findings.   ED Course:   Patient is sitting comfortable in ED bed in no acute distress nontoxic-appearing.  Patient has obvious brisk bleeding from the left nare.  Has also spit up several clots from postnasal drainage.  Afrin was trialed in triage.  After Afrin was trialed patient attempted to stop the bleed with compression and gauze.  There was still brisk bleeding from the left nare after the compression for several minutes.  TXA was soaked onto a gauze and packed into the nostril.  After 5 minutes patient had no more visualized bleeding from the left nare.  The nare was cleaned and patient was advised to hold pressure with gauze for approximately 5 more minutes.  No more  active bleeding noted in the left nare after several minutes. Patient was given strict return precautions and reported he was comfortable with discharge at this time. Patient was given afrin and at home instructions for nosebleeds.   Portions of this note were generated with Scientist, clinical (histocompatibility and immunogenetics). Dictation errors may occur despite best attempts at proofreading.   Final diagnoses:  Epistaxis    ED Discharge Orders     None          Myriam Fonda GORMAN DEVONNA 07/07/24 1947    Charlyn Sora, MD 07/07/24 [phone]

## 2024-07-07 NOTE — Discharge Instructions (Signed)
You had a nose bleed which stopped spontaneously. °Nose bleeds can recur however, so please read the instructions below. ° °If the bleeding recurs, please apply direct pressure to the nose for 5 minutes straight, breathing from the mouth and spitting out any blood. °After 5 minutes of holding pressure, if the bleeding continues, do the same thing again for 5 more minutes. °If after 2nd round of holding pressure the bleeding persists - clear the nose and apply afrin spray. After applying afrin spray to the nares, hold pressure again for 5 minutes. °If the bleeding continues despite these measures, then come to the ER while holding direct pressure to the nares. ° °For now - please do not blow your nose, or put fingers in your nose to clear up any clots. ° °

## 2024-07-07 NOTE — ED Triage Notes (Signed)
 Pt reports a nosebleed starting x30 minutes ago. Pt has hx of previous nosebleeds. Pt denies any injury or trauma.

## 2024-07-17 ENCOUNTER — Other Ambulatory Visit: Payer: Self-pay | Admitting: Internal Medicine

## 2024-07-22 DIAGNOSIS — R10A1 Flank pain, right side: Secondary | ICD-10-CM | POA: Diagnosis not present

## 2024-07-22 DIAGNOSIS — R300 Dysuria: Secondary | ICD-10-CM | POA: Diagnosis not present

## 2024-07-22 DIAGNOSIS — N3001 Acute cystitis with hematuria: Secondary | ICD-10-CM | POA: Diagnosis not present

## 2024-07-22 DIAGNOSIS — K5900 Constipation, unspecified: Secondary | ICD-10-CM | POA: Diagnosis not present

## 2024-07-22 LAB — GENECONNECT MOLECULAR SCREEN: Genetic Analysis Overall Interpretation: NEGATIVE

## 2024-08-19 DIAGNOSIS — C712 Malignant neoplasm of temporal lobe: Secondary | ICD-10-CM | POA: Diagnosis not present

## 2024-09-05 ENCOUNTER — Encounter: Payer: Self-pay | Admitting: Emergency Medicine

## 2024-09-05 ENCOUNTER — Ambulatory Visit: Admitting: Emergency Medicine

## 2024-09-05 VITALS — BP 122/80 | HR 88 | Temp 98.0°F | Ht 72.0 in | Wt 167.0 lb

## 2024-09-05 DIAGNOSIS — H6691 Otitis media, unspecified, right ear: Secondary | ICD-10-CM | POA: Diagnosis not present

## 2024-09-05 DIAGNOSIS — S39012A Strain of muscle, fascia and tendon of lower back, initial encounter: Secondary | ICD-10-CM | POA: Insufficient documentation

## 2024-09-05 DIAGNOSIS — M545 Low back pain, unspecified: Secondary | ICD-10-CM | POA: Insufficient documentation

## 2024-09-05 LAB — CBC WITH DIFFERENTIAL/PLATELET
Basophils Absolute: 0 K/uL (ref 0.0–0.1)
Basophils Relative: 0.2 % (ref 0.0–3.0)
Eosinophils Absolute: 0.1 K/uL (ref 0.0–0.7)
Eosinophils Relative: 1.2 % (ref 0.0–5.0)
HCT: 42.3 % (ref 39.0–52.0)
Hemoglobin: 14.4 g/dL (ref 13.0–17.0)
Lymphocytes Relative: 12.9 % (ref 12.0–46.0)
Lymphs Abs: 0.7 K/uL (ref 0.7–4.0)
MCHC: 34.2 g/dL (ref 30.0–36.0)
MCV: 97.1 fl (ref 78.0–100.0)
Monocytes Absolute: 0.5 K/uL (ref 0.1–1.0)
Monocytes Relative: 9.4 % (ref 3.0–12.0)
Neutro Abs: 4.2 K/uL (ref 1.4–7.7)
Neutrophils Relative %: 76.3 % (ref 43.0–77.0)
Platelets: 168 K/uL (ref 150.0–400.0)
RBC: 4.35 Mil/uL (ref 4.22–5.81)
RDW: 13.6 % (ref 11.5–15.5)
WBC: 5.5 K/uL (ref 4.0–10.5)

## 2024-09-05 LAB — COMPREHENSIVE METABOLIC PANEL WITH GFR
ALT: 11 U/L (ref 3–53)
AST: 15 U/L (ref 5–37)
Albumin: 4.6 g/dL (ref 3.5–5.2)
Alkaline Phosphatase: 42 U/L (ref 39–117)
BUN: 15 mg/dL (ref 6–23)
CO2: 30 meq/L (ref 19–32)
Calcium: 9.2 mg/dL (ref 8.4–10.5)
Chloride: 105 meq/L (ref 96–112)
Creatinine, Ser: 0.85 mg/dL (ref 0.40–1.50)
GFR: 102.94 mL/min
Glucose, Bld: 86 mg/dL (ref 70–99)
Potassium: 4.1 meq/L (ref 3.5–5.1)
Sodium: 141 meq/L (ref 135–145)
Total Bilirubin: 0.9 mg/dL (ref 0.2–1.2)
Total Protein: 6.8 g/dL (ref 6.0–8.3)

## 2024-09-05 MED ORDER — HYDROCODONE-ACETAMINOPHEN 5-325 MG PO TABS: 1.0000 | ORAL_TABLET | Freq: Four times a day (QID) | ORAL | 0 refills | Status: AC | PRN

## 2024-09-05 MED ORDER — AZITHROMYCIN 250 MG PO TABS: ORAL_TABLET | ORAL | 0 refills | Status: AC

## 2024-09-05 NOTE — Patient Instructions (Signed)
 Acute Back Pain, Adult Acute back pain is sudden and usually short-lived. It is often caused by an injury to the muscles and tissues in the back. The injury may result from: A muscle, tendon, or ligament getting overstretched or torn. Ligaments are tissues that connect bones to each other. Lifting something improperly can cause a back strain. Wear and tear (degeneration) of the spinal disks. Spinal disks are circular tissue that provide cushioning between the bones of the spine (vertebrae). Twisting motions, such as while playing sports or doing yard work. A hit to the back. Arthritis. You may have a physical exam, lab tests, and imaging tests to find the cause of your pain. Acute back pain usually goes away with rest and home care. Follow these instructions at home: Managing pain, stiffness, and swelling Take over-the-counter and prescription medicines only as told by your health care provider. Treatment may include medicines for pain and inflammation that are taken by mouth or applied to the skin, or muscle relaxants. Your health care provider may recommend applying ice during the first 24-48 hours after your pain starts. To do this: Put ice in a plastic bag. Place a towel between your skin and the bag. Leave the ice on for 20 minutes, 2-3 times a day. Remove the ice if your skin turns bright red. This is very important. If you cannot feel pain, heat, or cold, you have a greater risk of damage to the area. If directed, apply heat to the affected area as often as told by your health care provider. Use the heat source that your health care provider recommends, such as a moist heat pack or a heating pad. Place a towel between your skin and the heat source. Leave the heat on for 20-30 minutes. Remove the heat if your skin turns bright red. This is especially important if you are unable to feel pain, heat, or cold. You have a greater risk of getting burned. Activity  Do not stay in bed. Staying in  bed for more than 1-2 days can delay your recovery. Sit up and stand up straight. Avoid leaning forward when you sit or hunching over when you stand. If you work at a desk, sit close to it so you do not need to lean over. Keep your chin tucked in. Keep your neck drawn back, and keep your elbows bent at a 90-degree angle (right angle). Sit high and close to the steering wheel when you drive. Add lower back (lumbar) support to your car seat, if needed. Take short walks on even surfaces as soon as you are able. Try to increase the length of time you walk each day. Do not sit, drive, or stand in one place for more than 30 minutes at a time. Sitting or standing for long periods of time can put stress on your back. Do not drive or use heavy machinery while taking prescription pain medicine. Use proper lifting techniques. When you bend and lift, use positions that put less stress on your back: [place] your knees. Keep the load close to your body. Avoid twisting. Exercise regularly as told by your health care provider. Exercising helps your back heal faster and helps prevent back injuries by keeping muscles strong and flexible. Work with a physical therapist to make a safe exercise program, as recommended by your health care provider. Do any exercises as told by your physical therapist. Lifestyle Maintain a healthy weight. Extra weight puts stress on your back and makes it difficult to have good  posture. Avoid activities or situations that make you feel anxious or stressed. Stress and anxiety increase muscle tension and can make back pain worse. Learn ways to manage anxiety and stress, such as through exercise. General instructions Sleep on a firm mattress in a comfortable position. Try lying on your side with your knees slightly bent. If you lie on your back, put a pillow under your knees. Keep your head and neck in a straight line with your spine (neutral position) when using electronic equipment like  smartphones or pads. To do this: Raise your smartphone or pad to look at it instead of bending your head or neck to look down. Put the smartphone or pad at the level of your face while looking at the screen. Follow your treatment plan as told by your health care provider. This may include: Cognitive or behavioral therapy. Acupuncture or massage therapy. Meditation or yoga. Contact a health care provider if: You have pain that is not relieved with rest or medicine. You have increasing pain going down into your legs or buttocks. Your pain does not improve after 2 weeks. You have pain at night. You lose weight without trying. You have a fever or chills. You develop nausea or vomiting. You develop abdominal pain. Get help right away if: You develop new bowel or bladder control problems. You have unusual weakness or numbness in your arms or legs. You feel faint. These symptoms may represent a serious problem that is an emergency. Do not wait to see if the symptoms will go away. Get medical help right away. Call your local emergency services (911 in the [place] Do not drive yourself to the hospital. Summary Acute back pain is sudden and usually short-lived. Use proper lifting techniques. When you bend and lift, use positions that put less stress on your back. Take over-the-counter and prescription medicines only as told by your health care provider, and apply heat or ice as told. This information is not intended to replace advice given to you by your health care provider. Make sure you discuss any questions you have with your health care provider. Document Revised: 11/06/2020 Document Reviewed: 11/06/2020 Elsevier Patient Education  2024 [employer].

## 2024-09-05 NOTE — Assessment & Plan Note (Signed)
 Secondary to recent strain.  No red flag signs or symptoms. Mechanical in nature. Pain management discussed. Need to follow-up with PCP

## 2024-09-05 NOTE — Assessment & Plan Note (Signed)
 Creating ear pain and congestion Recommend to start azithromycin .  Allergic to penicillins

## 2024-09-05 NOTE — Assessment & Plan Note (Signed)
 Back pain management discussed Light duty at work recommended Recommend Tylenol  for mild to moderate pain and Norco for moderate to severe pain as needed

## 2024-09-05 NOTE — Progress Notes (Signed)
 Shawn York 49 y.o.   Chief Complaint  Patient presents with   Back Pain    Pt state that he hurt it at work picking up an item     HISTORY OF PRESENT ILLNESS: This is a 49 y.o. male injured back while lifting heavy item yesterday Complaining of lumbar pain. Also complaining of pain to right ear and possible infection   Back Pain Pertinent negatives include no abdominal pain, chest pain, dysuria, fever or headaches.     Prior to Admission medications  Medication Sig Start Date End Date Taking? Authorizing Provider  amphetamine -dextroamphetamine  (ADDERALL XR) 20 MG 24 hr capsule Take 1 capsule (20 mg total) by mouth daily. Before breakfast po. 05/14/24   Gregg Lek, MD  amphetamine -dextroamphetamine  (ADDERALL) 10 MG tablet Take 1 tablet (10 mg total) by mouth as needed. Please use this medication no later than 2 pm as needed for alertness and focus. 05/14/24   Gregg Lek, MD  b complex vitamins capsule Take 1 capsule by mouth daily.    [provider]  baclofen  (LIORESAL ) 10 MG tablet Nightly and as needed for cramping / spasms 06/26/24   Emeline Search C, DO  busPIRone  (BUSPAR ) 30 MG tablet Take 1 tablet (30 mg total) by mouth 2 (two) times daily. 06/23/23 03/25/24  Franchot Harlene SQUIBB, PMHNP  escitalopram  (LEXAPRO ) 10 MG tablet Take 1 tablet (10 mg total) by mouth daily. 08/19/23   Franchot Harlene SQUIBB, PMHNP  gabapentin  (NEURONTIN ) 300 MG capsule As needed up to 3 times daily for hand paresthesias 06/26/24   Emeline Search C, DO  levocetirizine (XYZAL ) 5 MG tablet TAKE 1 TABLET BY MOUTH EVERY DAY IN THE EVENING 07/18/24   Joshua Debby CROME, MD  ondansetron  (ZOFRAN ) 8 MG tablet Take by mouth. 12/17/23   [provider]  Oxcarbazepine  (TRILEPTAL ) 300 MG tablet Take 1 tablet (300 mg total) by mouth 2 (two) times daily. 02/08/23   Dohmeier, Dedra, MD  temozolomide (TEMODAR) 100 MG capsule Take 100 mg by mouth daily. 5 days on 28 days off Patient taking differently: Take  300 mg by mouth daily. 5 days on 28 days off 06/30/23   [provider]  temozolomide (TEMODAR) 250 MG capsule Take 250 mg by mouth daily. 5 days on and 28 days off 05/05/23   [provider]    Allergies[1]  Patient Active Problem List   Diagnosis Date Noted   Persistent depressive disorder 06/26/2024   Impingement of right shoulder 06/26/2024   Urge incontinence of urine 06/03/2024   Visual complaint 04/04/2024   Chronic right shoulder pain 02/28/2024   Deficiency anemia 02/28/2024   Thrombocytopenia 02/28/2024   Arthritis of right acromioclavicular joint 02/28/2024   Neuropathic pain of left hand 12/31/2023   Cognitive deficits 12/31/2023   Dyslipidemia, goal LDL below 130 07/24/2023   Gastroesophageal reflux disease with esophagitis without hemorrhage 07/24/2023   Cerebrovascular accident (CVA) due to embolism of right posterior cerebral artery (HCC) 06/21/2023   Partial symptomatic epilepsy with complex partial seizures, not intractable, without status epilepticus (HCC) 06/21/2023   Hypersomnia due to medical condition 06/21/2023   Leg cramping 05/29/2023   Left hemiparesis (HCC) 02/19/2023   Hemisensory deficit 02/19/2023   Shoulder-hand syndrome 02/19/2023   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 02/08/2023   Depression with anxiety 01/11/2023   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   Coagulation defect, unspecified 11/02/2022   Amnestic MCI (mild cognitive impairment with memory loss) 11/02/2022   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) 11/02/2022   Abnormal finding on MRI of brain 05/30/2022   Aneurysm of ascending aorta without rupture 02/28/2022   Anemia due to acquired thiamine  deficiency 12/01/2021   Seasonal allergic rhinitis due to pollen 11/08/2021   Family history of dissecting aortic aneurysm 11/08/2021   Contrast media allergy 11/08/2021   Avascular necrosis of bone of  right hip (HCC) 11/02/2021   Avascular necrosis of bone of left hip (HCC) 11/02/2021   Primary hypertension 05/17/2021   Screening for colon cancer 05/17/2021   PTSD (post-traumatic stress disorder) 06/05/2018   Nightmares 09/27/2017   Brain tumor, glioma (HCC) 09/27/2017   Severe episode of recurrent major depressive disorder, without psychotic features (HCC)    Major depressive disorder, recurrent episode 08/17/2016   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   Depression    Epistaxis    Headache    Hypertension    PTSD (post-traumatic stress disorder)    Seizures (HCC)     Past Surgical History:  Procedure Laterality Date   BASAL CELL CARCINOMA EXCISION  1995   BRAIN SURGERY  12/29/2022   CRANIOTOMY FOR TUMOR Right 02/25/2011   IR ANGIO EXTERNAL CAROTID SEL EXT CAROTID BILAT MOD SED  11/16/2021   IR ANGIO INTRA EXTRACRAN SEL COM CAROTID INNOMINATE UNI R MOD SED  11/16/2021   IR ANGIO INTRA EXTRACRAN SEL INTERNAL CAROTID UNI L MOD SED  11/16/2021   IR ANGIO VERTEBRAL SEL VERTEBRAL UNI R MOD SED  11/16/2021   IR ANGIOGRAM FOLLOW UP STUDY  11/17/2021   IR NEURO EACH ADD'L AFTER BASIC UNI LEFT (MS)  11/17/2021   IR NEURO EACH ADD'L AFTER BASIC UNI RIGHT (MS)  11/17/2021   IR TRANSCATH/EMBOLIZ  11/16/2021   IR US  GUIDE VASC ACCESS RIGHT  11/16/2021   RADIOLOGY WITH ANESTHESIA N/A 11/16/2021   Procedure: IR WITH ANESTHESIA;  Surgeon: de Macedo Rodrigues, Katyucia, MD;  Location: [facility] OR;  Service: Radiology;  Laterality: N/A;   TOTAL HIP ARTHROPLASTY Right 12/21/2021   Procedure: RIGHT TOTAL HIP REPLACEMENT;  Surgeon: Jerri Kay HERO, MD;  Location: [facility] OR;  Service: Orthopedics;  Laterality: Right;  3-C   WISDOM TOOTH EXTRACTION      Social History   Socioeconomic History   Marital status: Married     Spouse name: Not on file   Number of children: Not on file   Years of education: Not on file   Highest education level: Some college, no degree  Occupational History   Not on file  Tobacco Use   Smoking status: Every Day    Current packs/day: 0.00    Types: Cigarettes    Last attempt to quit: 02/25/2011    Years since quitting: 13.5   Smokeless tobacco: Never  Vaping Use   Vaping status: Never Used  Substance and Sexual Activity   Alcohol  use: Not Currently    Alcohol /week: 7.0 standard drinks of alcohol     Types: 7 Glasses of wine per week   Drug use: No   Sexual activity: Yes    Comment: E-cigarette users  Other Topics Concern   Not on file  Social History Narrative   Not on file   Social Drivers of Health   Tobacco Use: Medium Risk (07/22/2024)   Received from [facility]  Patient History    Smoking Tobacco Use: Former    Smokeless Tobacco Use: Never    Passive Exposure: Not on file  Financial Resource Strain: Low Risk (02/27/2024)   Overall Financial Resource Strain (CARDIA)    Difficulty of Paying Living Expenses: Not hard at all  Food Insecurity: Patient Declined (02/27/2024)   Epic    Worried About Programme Researcher, Broadcasting/film/video in the Last Year: Patient declined    Barista in the Last Year: Patient declined  Transportation Needs: No Transportation Needs (02/27/2024)   Epic    Lack of Transportation (Medical): No    Lack of Transportation (Non-Medical): No  Physical Activity: Sufficiently Active (02/27/2024)   Exercise Vital Sign    Days of Exercise per Week: 4 days    Minutes of Exercise per Session: 150+ min  Stress: Stress Concern Present (02/27/2024)   [employer] of Occupational Health - Occupational Stress Questionnaire    Feeling of Stress: To some extent  Social Connections: Socially Isolated (02/27/2024)   Social Connection and Isolation Panel    Frequency of Communication with Friends and Family: Twice a week    Frequency of Social Gatherings with  Friends and Family: Once a week    Attends Religious Services: Never    Database Administrator or Organizations: No    Attends Engineer, Structural: Not on file    Marital Status: Separated  Intimate Partner Violence: Not on file  Depression (PHQ2-9): High Risk (02/28/2024)   Depression (PHQ2-9)    PHQ-2 Score: 17  Alcohol  Screen: Low Risk (02/27/2024)   Alcohol  Screen    Last Alcohol  Screening Score (AUDIT): 2  Housing: Low Risk (02/27/2024)   Epic    Unable to Pay for Housing in the Last Year: No    Number of Times Moved in the Last Year: 1    Homeless in the Last Year: No  Utilities: Not on file  Health Literacy: Not on file    Family History  Problem Relation Age of Onset   Cancer Other        brain cancer   Cancer Father        skin cancer   Cancer Maternal Grandmother 45       brain cancer   Alcohol  abuse Brother    Alzheimer's disease Paternal Grandmother    Dementia Paternal Grandmother    Heart disease Paternal Grandfather      Review of Systems  Constitutional: Negative.  Negative for chills and fever.  HENT: Negative.  Negative for congestion and sore throat.   Respiratory: Negative.  Negative for cough and shortness of breath.   Cardiovascular: Negative.  Negative for chest pain and palpitations.  Gastrointestinal:  Negative for abdominal pain, diarrhea, nausea and vomiting.  Genitourinary: Negative.  Negative for dysuria and hematuria.  Musculoskeletal:  Positive for back pain.  Skin: Negative.  Negative for rash.  Neurological: Negative.  Negative for dizziness and headaches.  All other systems reviewed and are negative.   Today's Vitals   09/05/24 1308  BP: 122/80  Pulse: 88  Temp: 98 F (36.7 C)  TempSrc: Oral  SpO2: 99%  Weight: 167 lb (75.8 kg)  Height: 6' (1.829 m)   Body mass index is 22.65 kg/m.   Physical Exam Vitals reviewed.  Constitutional:      Appearance: Normal appearance.  HENT:     Head: Normocephalic.     Right  Ear: Tympanic membrane is injected and erythematous.  Left Ear: Tympanic membrane, ear canal and external ear normal.  Eyes:     Extraocular Movements: Extraocular movements intact.  Cardiovascular:     Rate and Rhythm: Normal rate.  Pulmonary:     Effort: Pulmonary effort is normal.  Musculoskeletal:     Lumbar back: Tenderness present. No bony tenderness. Decreased range of motion.  Skin:    General: Skin is warm and dry.  Neurological:     Mental Status: He is alert and oriented to person, place, and time.  Psychiatric:        Mood and Affect: Mood normal.        Behavior: Behavior normal.      ASSESSMENT & PLAN: Problem List Items Addressed This Visit       Nervous and Auditory   Right otitis media   Creating ear pain and congestion Recommend to start azithromycin .  Allergic to penicillins      Relevant Medications   azithromycin  (ZITHROMAX ) 250 MG tablet   Other Relevant Orders   CBC with Differential/Platelet   Comprehensive metabolic panel with GFR     Musculoskeletal and Integument   Strain of lumbar region - Primary   Back pain management discussed Light duty at work recommended Recommend Tylenol  for mild to moderate pain and Norco for moderate to severe pain as needed      Relevant Medications   HYDROcodone -acetaminophen  (NORCO/VICODIN) 5-325 MG tablet   Other Relevant Orders   CBC with Differential/Platelet   Comprehensive metabolic panel with GFR     Other   Lumbar pain   Secondary to recent strain.  No red flag signs or symptoms. Mechanical in nature. Pain management discussed. Need to follow-up with PCP      Relevant Medications   HYDROcodone -acetaminophen  (NORCO/VICODIN) 5-325 MG tablet   Other Relevant Orders   CBC with Differential/Platelet   Comprehensive metabolic panel with GFR   Patient Instructions  Acute Back Pain, Adult Acute back pain is sudden and usually short-lived. It is often caused by an injury to the muscles and  tissues in the back. The injury may result from: A muscle, tendon, or ligament getting overstretched or torn. Ligaments are tissues that connect bones to each other. Lifting something improperly can cause a back strain. Wear and tear (degeneration) of the spinal disks. Spinal disks are circular tissue that provide cushioning between the bones of the spine (vertebrae). Twisting motions, such as while playing sports or doing yard work. A hit to the back. Arthritis. You may have a physical exam, lab tests, and imaging tests to find the cause of your pain. Acute back pain usually goes away with rest and home care. Follow these instructions at home: Managing pain, stiffness, and swelling Take over-the-counter and prescription medicines only as told by your health care provider. Treatment may include medicines for pain and inflammation that are taken by mouth or applied to the skin, or muscle relaxants. Your health care provider may recommend applying ice during the first 24-48 hours after your pain starts. To do this: Put ice in a plastic bag. Place a towel between your skin and the bag. Leave the ice on for 20 minutes, 2-3 times a day. Remove the ice if your skin turns bright red. This is very important. If you cannot feel pain, heat, or cold, you have a greater risk of damage to the area. If directed, apply heat to the affected area as often as told by your health care provider. Use the heat source that  your health care provider recommends, such as a moist heat pack or a heating pad. Place a towel between your skin and the heat source. Leave the heat on for 20-30 minutes. Remove the heat if your skin turns bright red. This is especially important if you are unable to feel pain, heat, or cold. You have a greater risk of getting burned. Activity  Do not stay in bed. Staying in bed for more than 1-2 days can delay your recovery. Sit up and stand up straight. Avoid leaning forward when you sit or  hunching over when you stand. If you work at a desk, sit close to it so you do not need to lean over. Keep your chin tucked in. Keep your neck drawn back, and keep your elbows bent at a 90-degree angle (right angle). Sit high and close to the steering wheel when you drive. Add lower back (lumbar) support to your car seat, if needed. Take short walks on even surfaces as soon as you are able. Try to increase the length of time you walk each day. Do not sit, drive, or stand in one place for more than 30 minutes at a time. Sitting or standing for long periods of time can put stress on your back. Do not drive or use heavy machinery while taking prescription pain medicine. Use proper lifting techniques. When you bend and lift, use positions that put less stress on your back: [place] your knees. Keep the load close to your body. Avoid twisting. Exercise regularly as told by your health care provider. Exercising helps your back heal faster and helps prevent back injuries by keeping muscles strong and flexible. Work with a physical therapist to make a safe exercise program, as recommended by your health care provider. Do any exercises as told by your physical therapist. Lifestyle Maintain a healthy weight. Extra weight puts stress on your back and makes it difficult to have good posture. Avoid activities or situations that make you feel anxious or stressed. Stress and anxiety increase muscle tension and can make back pain worse. Learn ways to manage anxiety and stress, such as through exercise. General instructions Sleep on a firm mattress in a comfortable position. Try lying on your side with your knees slightly bent. If you lie on your back, put a pillow under your knees. Keep your head and neck in a straight line with your spine (neutral position) when using electronic equipment like smartphones or pads. To do this: Raise your smartphone or pad to look at it instead of bending your head or neck to look  down. Put the smartphone or pad at the level of your face while looking at the screen. Follow your treatment plan as told by your health care provider. This may include: Cognitive or behavioral therapy. Acupuncture or massage therapy. Meditation or yoga. Contact a health care provider if: You have pain that is not relieved with rest or medicine. You have increasing pain going down into your legs or buttocks. Your pain does not improve after 2 weeks. You have pain at night. You lose weight without trying. You have a fever or chills. You develop nausea or vomiting. You develop abdominal pain. Get help right away if: You develop new bowel or bladder control problems. You have unusual weakness or numbness in your arms or legs. You feel faint. These symptoms may represent a serious problem that is an emergency. Do not wait to see if the symptoms will go away. Get medical help right away. Call  your local emergency services (911 in the [place] Do not drive yourself to the hospital. Summary Acute back pain is sudden and usually short-lived. Use proper lifting techniques. When you bend and lift, use positions that put less stress on your back. Take over-the-counter and prescription medicines only as told by your health care provider, and apply heat or ice as told. This information is not intended to replace advice given to you by your health care provider. Make sure you discuss any questions you have with your health care provider. Document Revised: 11/06/2020 Document Reviewed: 11/06/2020 Elsevier Patient Education  2024 [employer] Inc[employer]    Emil Schaumann, MD [facility]    [1]  Allergies Allergen Reactions   Iodinated Contrast Media Rash and Anaphylaxis   Penicillins Rash, Shortness Of Breath and Dermatitis    Did it involve swelling of the face/tongue/throat, SOB, or low BP? Yes  Did it involve sudden or severe rash/hives, skin peeling, or any reaction on the  inside of your mouth or nose? No  Did you need to seek medical attention at a hospital or doctor's office? No  When did it last happen? Unk  If all above answers are NO, may proceed with cephalosporin use.   Shellfish Allergy Anaphylaxis and Rash   Shellfish Protein-Containing Drug Products Anaphylaxis and Rash

## 2024-09-19 ENCOUNTER — Other Ambulatory Visit: Payer: Self-pay | Admitting: Emergency Medicine

## 2024-09-25 ENCOUNTER — Encounter: Payer: Self-pay | Admitting: *Deleted

## 2024-09-25 NOTE — Progress Notes (Signed)
 [name]                                          MRN: [id number]   09/25/2024   The [facility] Quality Team Specialist reviewed this patient medical record for the purposes of chart review for care gap closure. The following were reviewed: chart review for care gap closure-controlling blood pressure.    [facility] Quality Team

## 2024-10-04 ENCOUNTER — Encounter: Payer: Self-pay | Admitting: Emergency Medicine

## 2024-10-04 ENCOUNTER — Encounter: Payer: Self-pay | Admitting: Physical Medicine and Rehabilitation

## 2024-10-04 NOTE — Telephone Encounter (Signed)
 Please advise.

## 2024-12-25 ENCOUNTER — Encounter: Admitting: Physical Medicine and Rehabilitation
